# Patient Record
Sex: Male | Born: 1945
Health system: Southern US, Community
[De-identification: ages and names within clinical notes are randomized; demographics above are authoritative.]

## PROBLEM LIST (undated history)

## (undated) DIAGNOSIS — E119 Type 2 diabetes mellitus without complications: Secondary | ICD-10-CM

## (undated) DIAGNOSIS — I639 Cerebral infarction, unspecified: Secondary | ICD-10-CM

## (undated) DIAGNOSIS — T7840XA Allergy, unspecified, initial encounter: Secondary | ICD-10-CM

## (undated) DIAGNOSIS — Q8901 Asplenia (congenital): Secondary | ICD-10-CM

## (undated) DIAGNOSIS — I6529 Occlusion and stenosis of unspecified carotid artery: Secondary | ICD-10-CM

## (undated) DIAGNOSIS — I10 Essential (primary) hypertension: Secondary | ICD-10-CM

## (undated) DIAGNOSIS — C801 Malignant (primary) neoplasm, unspecified: Secondary | ICD-10-CM

## (undated) DIAGNOSIS — E7800 Pure hypercholesterolemia, unspecified: Secondary | ICD-10-CM

## (undated) DIAGNOSIS — H269 Unspecified cataract: Secondary | ICD-10-CM

## (undated) HISTORY — DX: Cerebral infarction, unspecified: I63.9

## (undated) HISTORY — DX: Allergy, unspecified, initial encounter: T78.40XA

## (undated) HISTORY — DX: Malignant (primary) neoplasm, unspecified: C80.1

## (undated) HISTORY — PX: EYE SURGERY: SHX253

## (undated) HISTORY — DX: Unspecified cataract: H26.9

## (undated) HISTORY — PX: PROSTATE SURGERY: SHX751

## (undated) HISTORY — PX: SPLENECTOMY: SUR1306

## (undated) HISTORY — DX: Asplenia (congenital): Q89.01

## (undated) HISTORY — DX: Occlusion and stenosis of unspecified carotid artery: I65.29

---

## 2004-08-16 ENCOUNTER — Ambulatory Visit: Payer: Self-pay | Admitting: Family Medicine

## 2004-09-27 ENCOUNTER — Ambulatory Visit: Payer: Self-pay | Admitting: Family Medicine

## 2005-03-12 ENCOUNTER — Ambulatory Visit: Payer: Self-pay | Admitting: Family Medicine

## 2005-04-11 ENCOUNTER — Ambulatory Visit: Payer: Self-pay | Admitting: Family Medicine

## 2005-12-11 ENCOUNTER — Ambulatory Visit: Payer: Self-pay | Admitting: Family Medicine

## 2015-05-01 DIAGNOSIS — E113213 Type 2 diabetes mellitus with mild nonproliferative diabetic retinopathy with macular edema, bilateral: Secondary | ICD-10-CM | POA: Insufficient documentation

## 2015-05-01 DIAGNOSIS — H47323 Drusen of optic disc, bilateral: Secondary | ICD-10-CM | POA: Insufficient documentation

## 2015-05-01 DIAGNOSIS — H35352 Cystoid macular degeneration, left eye: Secondary | ICD-10-CM | POA: Insufficient documentation

## 2015-05-02 DIAGNOSIS — H35372 Puckering of macula, left eye: Secondary | ICD-10-CM | POA: Insufficient documentation

## 2015-10-13 DIAGNOSIS — H35372 Puckering of macula, left eye: Secondary | ICD-10-CM | POA: Diagnosis not present

## 2015-11-13 DIAGNOSIS — E291 Testicular hypofunction: Secondary | ICD-10-CM | POA: Insufficient documentation

## 2015-11-13 DIAGNOSIS — H538 Other visual disturbances: Secondary | ICD-10-CM | POA: Insufficient documentation

## 2015-11-13 DIAGNOSIS — E876 Hypokalemia: Secondary | ICD-10-CM | POA: Insufficient documentation

## 2015-11-13 DIAGNOSIS — J309 Allergic rhinitis, unspecified: Secondary | ICD-10-CM | POA: Insufficient documentation

## 2015-11-13 DIAGNOSIS — E7849 Other hyperlipidemia: Secondary | ICD-10-CM | POA: Insufficient documentation

## 2015-11-13 DIAGNOSIS — N329 Bladder disorder, unspecified: Secondary | ICD-10-CM | POA: Insufficient documentation

## 2015-12-08 DIAGNOSIS — E113293 Type 2 diabetes mellitus with mild nonproliferative diabetic retinopathy without macular edema, bilateral: Secondary | ICD-10-CM | POA: Diagnosis not present

## 2015-12-08 DIAGNOSIS — H47323 Drusen of optic disc, bilateral: Secondary | ICD-10-CM | POA: Diagnosis not present

## 2016-01-13 DIAGNOSIS — S20212A Contusion of left front wall of thorax, initial encounter: Secondary | ICD-10-CM | POA: Diagnosis not present

## 2016-01-13 DIAGNOSIS — R0781 Pleurodynia: Secondary | ICD-10-CM | POA: Diagnosis not present

## 2016-01-13 DIAGNOSIS — W19XXXA Unspecified fall, initial encounter: Secondary | ICD-10-CM | POA: Diagnosis not present

## 2016-01-29 DIAGNOSIS — Z9081 Acquired absence of spleen: Secondary | ICD-10-CM | POA: Insufficient documentation

## 2016-01-30 DIAGNOSIS — I10 Essential (primary) hypertension: Secondary | ICD-10-CM | POA: Diagnosis not present

## 2016-01-30 DIAGNOSIS — E118 Type 2 diabetes mellitus with unspecified complications: Secondary | ICD-10-CM | POA: Diagnosis not present

## 2016-01-30 DIAGNOSIS — E113213 Type 2 diabetes mellitus with mild nonproliferative diabetic retinopathy with macular edema, bilateral: Secondary | ICD-10-CM | POA: Diagnosis not present

## 2016-01-30 DIAGNOSIS — E784 Other hyperlipidemia: Secondary | ICD-10-CM | POA: Diagnosis not present

## 2016-01-30 DIAGNOSIS — Z794 Long term (current) use of insulin: Secondary | ICD-10-CM | POA: Diagnosis not present

## 2016-02-06 DIAGNOSIS — I639 Cerebral infarction, unspecified: Secondary | ICD-10-CM

## 2016-02-06 HISTORY — DX: Cerebral infarction, unspecified: I63.9

## 2016-02-24 DIAGNOSIS — E119 Type 2 diabetes mellitus without complications: Secondary | ICD-10-CM | POA: Diagnosis not present

## 2016-02-24 DIAGNOSIS — R42 Dizziness and giddiness: Secondary | ICD-10-CM | POA: Diagnosis not present

## 2016-02-24 DIAGNOSIS — R413 Other amnesia: Secondary | ICD-10-CM | POA: Diagnosis not present

## 2016-02-24 DIAGNOSIS — I200 Unstable angina: Secondary | ICD-10-CM | POA: Diagnosis not present

## 2016-02-24 DIAGNOSIS — R531 Weakness: Secondary | ICD-10-CM | POA: Diagnosis not present

## 2016-02-24 DIAGNOSIS — R5383 Other fatigue: Secondary | ICD-10-CM | POA: Diagnosis not present

## 2016-02-24 DIAGNOSIS — I4589 Other specified conduction disorders: Secondary | ICD-10-CM | POA: Diagnosis not present

## 2016-02-24 DIAGNOSIS — D500 Iron deficiency anemia secondary to blood loss (chronic): Secondary | ICD-10-CM | POA: Diagnosis not present

## 2016-02-24 DIAGNOSIS — E1165 Type 2 diabetes mellitus with hyperglycemia: Secondary | ICD-10-CM | POA: Diagnosis not present

## 2016-02-24 DIAGNOSIS — E876 Hypokalemia: Secondary | ICD-10-CM | POA: Diagnosis not present

## 2016-02-26 ENCOUNTER — Emergency Department (HOSPITAL_COMMUNITY): Payer: PPO

## 2016-02-26 ENCOUNTER — Encounter (HOSPITAL_COMMUNITY): Payer: Self-pay

## 2016-02-26 ENCOUNTER — Inpatient Hospital Stay (HOSPITAL_COMMUNITY)
Admission: EM | Admit: 2016-02-26 | Discharge: 2016-02-28 | DRG: 066 | Disposition: A | Discharge status: Home / Self Care | Payer: PPO | Attending: Internal Medicine | Admitting: Internal Medicine

## 2016-02-26 DIAGNOSIS — I6350 Cerebral infarction due to unspecified occlusion or stenosis of unspecified cerebral artery: Secondary | ICD-10-CM | POA: Diagnosis present

## 2016-02-26 DIAGNOSIS — I10 Essential (primary) hypertension: Secondary | ICD-10-CM | POA: Diagnosis present

## 2016-02-26 DIAGNOSIS — E1165 Type 2 diabetes mellitus with hyperglycemia: Secondary | ICD-10-CM | POA: Diagnosis not present

## 2016-02-26 DIAGNOSIS — E0865 Diabetes mellitus due to underlying condition with hyperglycemia: Secondary | ICD-10-CM | POA: Diagnosis not present

## 2016-02-26 DIAGNOSIS — I6529 Occlusion and stenosis of unspecified carotid artery: Secondary | ICD-10-CM | POA: Diagnosis present

## 2016-02-26 DIAGNOSIS — R42 Dizziness and giddiness: Secondary | ICD-10-CM

## 2016-02-26 DIAGNOSIS — I639 Cerebral infarction, unspecified: Secondary | ICD-10-CM | POA: Diagnosis not present

## 2016-02-26 DIAGNOSIS — Z7982 Long term (current) use of aspirin: Secondary | ICD-10-CM | POA: Diagnosis not present

## 2016-02-26 DIAGNOSIS — Z87891 Personal history of nicotine dependence: Secondary | ICD-10-CM | POA: Diagnosis not present

## 2016-02-26 DIAGNOSIS — Z794 Long term (current) use of insulin: Secondary | ICD-10-CM | POA: Diagnosis not present

## 2016-02-26 DIAGNOSIS — R5383 Other fatigue: Secondary | ICD-10-CM | POA: Diagnosis not present

## 2016-02-26 DIAGNOSIS — E785 Hyperlipidemia, unspecified: Secondary | ICD-10-CM | POA: Diagnosis present

## 2016-02-26 DIAGNOSIS — Z9081 Acquired absence of spleen: Secondary | ICD-10-CM | POA: Diagnosis not present

## 2016-02-26 DIAGNOSIS — E7800 Pure hypercholesterolemia, unspecified: Secondary | ICD-10-CM | POA: Diagnosis present

## 2016-02-26 DIAGNOSIS — I6523 Occlusion and stenosis of bilateral carotid arteries: Secondary | ICD-10-CM | POA: Diagnosis not present

## 2016-02-26 HISTORY — DX: Essential (primary) hypertension: I10

## 2016-02-26 HISTORY — DX: Pure hypercholesterolemia, unspecified: E78.00

## 2016-02-26 HISTORY — DX: Type 2 diabetes mellitus without complications: E11.9

## 2016-02-26 LAB — BASIC METABOLIC PANEL
ANION GAP: 7 (ref 5–15)
BUN: 13 mg/dL (ref 6–20)
CHLORIDE: 98 mmol/L — AB (ref 101–111)
CO2: 29 mmol/L (ref 22–32)
Calcium: 8.8 mg/dL — ABNORMAL LOW (ref 8.9–10.3)
Creatinine, Ser: 0.76 mg/dL (ref 0.61–1.24)
GFR calc Af Amer: >60 mL/min (ref >60)
GLUCOSE: 417 mg/dL — AB (ref 65–99)
POTASSIUM: 3.9 mmol/L (ref 3.5–5.1)
Sodium: 134 mmol/L — ABNORMAL LOW (ref 135–145)

## 2016-02-26 LAB — URINALYSIS, ROUTINE W REFLEX MICROSCOPIC
BILIRUBIN URINE: NEGATIVE
Hgb urine dipstick: NEGATIVE
KETONES UR: NEGATIVE mg/dL
LEUKOCYTES UA: NEGATIVE
NITRITE: NEGATIVE
PH: 7 (ref 5.0–8.0)
Protein, ur: 30 mg/dL — AB
SPECIFIC GRAVITY, URINE: 1.038 — AB (ref 1.005–1.030)

## 2016-02-26 LAB — CBG MONITORING, ED
GLUCOSE-CAPILLARY: 192 mg/dL — AB (ref 65–99)
Glucose-Capillary: 415 mg/dL — ABNORMAL HIGH (ref 65–99)

## 2016-02-26 LAB — CBC
HEMATOCRIT: 43.7 % (ref 39.0–52.0)
HEMOGLOBIN: 15.1 g/dL (ref 13.0–17.0)
MCH: 31.1 pg (ref 26.0–34.0)
MCHC: 34.6 g/dL (ref 30.0–36.0)
MCV: 89.9 fL (ref 78.0–100.0)
Platelets: 278 10*3/uL (ref 150–400)
RBC: 4.86 MIL/uL (ref 4.22–5.81)
RDW: 12.8 % (ref 11.5–15.5)
WBC: 7.7 10*3/uL (ref 4.0–10.5)

## 2016-02-26 LAB — URINE MICROSCOPIC-ADD ON
BACTERIA UA: NONE SEEN
RBC / HPF: NONE SEEN RBC/hpf (ref 0–5)
SQUAMOUS EPITHELIAL / LPF: NONE SEEN
WBC UA: NONE SEEN WBC/hpf (ref 0–5)

## 2016-02-26 MED ORDER — ASPIRIN EC 325 MG PO TBEC
325.0000 mg | DELAYED_RELEASE_TABLET | Freq: Once | ORAL | Status: AC
  Administered 2016-02-27: 325 mg via ORAL
  Filled 2016-02-26: qty 1

## 2016-02-26 MED ORDER — INSULIN ASPART 100 UNIT/ML ~~LOC~~ SOLN
10.0000 [IU] | Freq: Once | SUBCUTANEOUS | Status: AC
  Administered 2016-02-26: 10 [IU] via INTRAVENOUS
  Filled 2016-02-26: qty 1

## 2016-02-26 MED ORDER — SODIUM CHLORIDE 0.9 % IV BOLUS (SEPSIS)
1000.0000 mL | Freq: Once | INTRAVENOUS | Status: AC
  Administered 2016-02-26: 1000 mL via INTRAVENOUS

## 2016-02-26 NOTE — ED Provider Notes (Signed)
CSN: [id number]     Arrival date & time 02/26/16  1601 History   First MD Initiated Contact with Patient 02/26/16 1917     Chief Complaint  Patient presents with  . Fatigue  . Hyperglycemia  PT HAS BEEN CONFUSED FOR THE PAST FEW DAYS.  HIS BS HAS REMAINED ELEVATED.  PT IS ALSO ACTING "DRUNK" ALTHOUGH HE HAS NOT BEEN DRINKING ETOH.  PT IS UNCOORDINATED AND HAS MEMORY ISSUES.  THE PT WENT TO [place] 2 DAYS AGO AND HIS BS WAS DECREASED, BUT HE IS NO BETTER.     (Consider location/radiation/quality/duration/timing/severity/associated sxs/prior Treatment) Patient is a 70 y.o. male presenting with hyperglycemia. The history is provided by the patient, the spouse and a relative.  Hyperglycemia Severity:  Moderate Onset quality:  Gradual   Past Medical History  Diagnosis Date  . Hypertension   . Diabetes mellitus without complication ([place])   . Hypercholesteremia    Past Surgical History  Procedure Laterality Date  . Prostate surgery    . Eye surgery    . Splenectomy     History reviewed. No pertinent family history. Social History  Substance Use Topics  . Smoking status: Former Research scientist (life sciences)  . Smokeless tobacco: None  . Alcohol Use: No    Review of Systems    Allergies  Benazepril and Penicillins  Home Medications   Prior to Admission medications   Medication Sig Start Date End Date Taking? Authorizing Provider  Amlodipine-Valsartan-HCTZ 10-160-12.5 MG TABS Take 1 tablet by mouth daily. 02/14/16  Yes Historical Provider, MD  aspirin 81 MG chewable tablet Chew 81 mg by mouth daily.   Yes Historical Provider, MD  atorvastatin (LIPITOR) 40 MG tablet Take 40 mg by mouth daily. 01/18/16  Yes Historical Provider, MD  carvedilol (COREG) 25 MG tablet Take 25 mg by mouth 2 (two) times daily. 02/07/16  Yes Historical Provider, MD  cloNIDine (CATAPRES) 0.1 MG tablet Take 0.5 mg by mouth 2 (two) times daily. 02/07/16  Yes Historical Provider, MD  fluticasone (FLONASE) 50 MCG/ACT nasal  spray Place 2 sprays into both nostrils daily as needed for allergies or rhinitis.   Yes Historical Provider, MD  Insulin Detemir (LEVEMIR FLEXTOUCH) 100 UNIT/ML Pen Inject 24 Units into the skin daily. 01/13/16  Yes Historical Provider, MD  metFORMIN (GLUCOPHAGE) 1000 MG tablet Take 1,000 mg by mouth 2 (two) times daily with a meal. 02/07/16  Yes Historical Provider, MD   BP 174/78 mmHg  Pulse 58  Temp(Src) 98.1 F (36.7 C) (Oral)  Resp 18  SpO2 97% Physical Exam  ED Course  Procedures (including critical care time) Labs Review Labs Reviewed  BASIC METABOLIC PANEL - Abnormal; Notable for the following:    Sodium 134 (*)    Chloride 98 (*)    Glucose, Bld 417 (*)    Calcium 8.8 (*)    All other components within normal limits  URINALYSIS, ROUTINE W REFLEX MICROSCOPIC (NOT AT [facility]) - Abnormal; Notable for the following:    Specific Gravity, Urine 1.038 (*)    Glucose, UA >1000 (*)    Protein, ur 30 (*)    All other components within normal limits  CBG MONITORING, ED - Abnormal; Notable for the following:    Glucose-Capillary 415 (*)    All other components within normal limits  CBG MONITORING, ED - Abnormal; Notable for the following:    Glucose-Capillary 192 (*)    All other components within normal limits  CBC  URINE MICROSCOPIC-ADD ON  CBG  MONITORING, ED    Imaging Review Dg Chest 2 View  02/26/2016  CLINICAL DATA:  Lethargy, dizziness, diabetes. EXAM: CHEST  2 VIEW COMPARISON:  Chest x-ray dated 02/24/2016 and chest x-ray dated 01/13/2016. FINDINGS: Heart size is normal. Overall cardiomediastinal silhouette is stable in size and configuration. Lungs are clear. No evidence of pneumonia. No pleural effusion or pneumothorax seen. Osseous structures about the chest are unremarkable. IMPRESSION: Lungs are clear and there is no evidence of acute cardiopulmonary abnormality. Electronically Signed   By: Franki Cabot M.D.   On: 02/26/2016 21:16   Mr Brain Wo Contrast  02/26/2016   CLINICAL DATA:  Initial evaluation for acute dizziness. EXAM: MRI HEAD WITHOUT CONTRAST TECHNIQUE: Multiplanar, multiecho pulse sequences of the brain and surrounding structures were obtained without intravenous contrast. COMPARISON:  None. FINDINGS: Study mildly degraded by motion artifact. Cerebral volume within normal limits for patient age. Mild scattered T2/FLAIR hyperintensity within the periventricular and deep white matter both cerebral hemispheres most consistent with chronic small vessel ischemic disease. There is an acute/early subacute ischemic infarct involving the right thalamus that measures 13 x 23 x 24 mm (series 3, image 27). No significant mass effect. No associated hemorrhage. No other acute infarct. Gray-white matter differentiation otherwise maintained. Major intracranial vascular flow voids are preserved. No mass lesion, midline shift, or mass effect. No hydrocephalus. No extra-axial fluid collection. No acute or chronic intracranial hemorrhage. Major dural sinuses are grossly patent. Craniocervical junction normal. Visualized upper cervical spine unremarkable. Pituitary gland within normal limits. No acute abnormality about the orbits. Sequela prior bilateral lens extraction noted. Mild scattered mucosal thickening within the ethmoidal air cells. Paranasal sinuses are otherwise clear. No mastoid effusion. Inner ear structures grossly normal. Bone marrow signal intensity within normal limits. No scalp soft tissue abnormality. IMPRESSION: 1. 13 x 23 x 24 mm acute/early subacute ischemic nonhemorrhagic right thalamic infarct. No associated mass effect. 2. Mild chronic small vessel ischemic disease. Electronically Signed   By: Jeannine Boga M.D.   On: 02/26/2016 22:32   I have personally reviewed and evaluated these images and lab results as part of my medical decision-making.   EKG Interpretation   Date/Time:  Sunday Feb 26 2016 16:39:15 EDT Ventricular Rate:  66 PR Interval:   190 QRS Duration: 113 QT Interval:  434 QTC Calculation: 455 R Axis:   78 Text Interpretation:  Sinus rhythm Anteroseptal infarct, age indeterminate  Baseline wander in lead(s) V3 V4 V6 Confirmed by Michella Detjen MD, Shemar Plemmons  ([id number]) on 02/26/2016 10:02:29 PM      MDM  PT D/W DR. Nicole Kindred (NEURO) WHO THINKS PT NEEDS TO BE ADMITTED.  HE REQUESTS A TRANSFER TO [facility] FOR FURTHER CVA WORK UP.  PT WAS D/W DR. Olevia Bowens WHO WILL ADMIT PT AND ARRANGE FOR TRANSFER. Final diagnoses:  Cerebral infarction due to cerebral artery occlusion ([place])  Poorly controlled diabetes mellitus ([facility])        Isla Pence, MD 02/27/16 0002

## 2016-02-26 NOTE — ED Notes (Signed)
Pt transported to MRI 

## 2016-02-26 NOTE — H&P (Signed)
History and Physical    Mark Stewart [id number] DOB: Jun 20, 1946 DOA: 02/26/2016  PCP: No primary care provider on file.   Patient coming from: Home.  Chief Complaint: Fatigue and elevated blood sugar.  HPI: Mark Stewart is a 70 y.o. male with medical history significant of hypertension, type 2 diabetes, hyperlipidemia, seasonal allergies comes to the emergency department.  Per patient and his family, for the last 2 weeks, he has been feeling increasingly more fatigued. He has noticed that his blood glucose and blood pressure and location have been elevated. His wife also states that he has been more forgetful in the past few months as well. She recently had surgeries and he has been taking care of her. On Thursday, patient was working outside the house during the morning, until around noontime when he felt more fatigue, lightheaded and was "acting drunk without drinking alcohol".   He went to his doctor who performed some lab work and asked him to go to the emergency department if symptoms persisted. He subsequently went to [facility] in [place] where he lives. Workup there was normal, and he was asked to increase his long-acting insulin from 20 units to 24 units SQ daily.  ED Course: Workup shows a normal CBC, hyperglycemia of 417 mg/dL, glucosuria and mild proteinuria on UA. Imaging of the brain with MRI shows a13 x 23 x 24 mm acute/early subacute ischemic nonhemorrhagic right thalamic infarct. No associated mass effect. Transferred to [facility] for further evaluation and treatment.  Review of Systems: As per HPI otherwise 10 point review of systems negative.    Past Medical History  Diagnosis Date  . Hypertension   . Diabetes mellitus without complication ([place])   . Hypercholesteremia     Past Surgical History  Procedure Laterality Date  . Prostate surgery    . Eye surgery    . Splenectomy       reports that he has quit smoking. He does not have any  smokeless tobacco history on file. He reports that he does not drink alcohol or use illicit drugs.  Allergies  Allergen Reactions  . Benazepril Cough  . Penicillins Rash    Has patient had a PCN reaction causing immediate rash, facial/tongue/throat swelling, SOB or lightheadedness with hypotension: yes Has patient had a PCN reaction causing severe rash involving mucus membranes or skin necrosis: no Has patient had a PCN reaction that required hospitalization: no Has patient had a PCN reaction occurring within the last 10 years: no If all of the above answers are "NO", then may proceed with Cephalosporin use.     Family History  Problem Relation Age of Onset  . Breast cancer Mother   . Diabetes Mellitus II Father   . Breast cancer Sister      Prior to Admission medications   Medication Sig Start Date End Date Taking? Authorizing Provider  Amlodipine-Valsartan-HCTZ 10-160-12.5 MG TABS Take 1 tablet by mouth daily. 02/14/16  Yes Historical Provider, MD  aspirin 81 MG chewable tablet Chew 81 mg by mouth daily.   Yes Historical Provider, MD  atorvastatin (LIPITOR) 40 MG tablet Take 40 mg by mouth daily. 01/18/16  Yes Historical Provider, MD  carvedilol (COREG) 25 MG tablet Take 25 mg by mouth 2 (two) times daily. 02/07/16  Yes Historical Provider, MD  cloNIDine (CATAPRES) 0.1 MG tablet Take 0.5 mg by mouth 2 (two) times daily. 02/07/16  Yes Historical Provider, MD  fluticasone (FLONASE) 50 MCG/ACT nasal spray Place 2  sprays into both nostrils daily as needed for allergies or rhinitis.   Yes Historical Provider, MD  Insulin Detemir (LEVEMIR FLEXTOUCH) 100 UNIT/ML Pen Inject 24 Units into the skin daily. 01/13/16  Yes Historical Provider, MD  metFORMIN (GLUCOPHAGE) 1000 MG tablet Take 1,000 mg by mouth 2 (two) times daily with a meal. 02/07/16  Yes Historical Provider, MD    Physical Exam: Filed Vitals:   02/26/16 1852 02/26/16 1950 02/26/16 2100 02/26/16 2304  BP: 171/95 187/82 193/90 174/78    Pulse: 56 58 72 58  Temp:      TempSrc:      Resp: 18 16  18   SpO2: 95% 97% 100% 97%      Constitutional: NAD, calm, comfortable Filed Vitals:   02/26/16 1852 02/26/16 1950 02/26/16 2100 02/26/16 2304  BP: 171/95 187/82 193/90 174/78  Pulse: 56 58 72 58  Temp:      TempSrc:      Resp: 18 16  18   SpO2: 95% 97% 100% 97%   Eyes: PERRL, lids and conjunctivae normal ENMT: Mucous membranes are moist. Posterior pharynx clear of any exudate or lesions.Normal dentition.  Neck: normal, supple, no masses, no thyromegaly Respiratory: clear to auscultation bilaterally, no wheezing, no crackles. Normal respiratory effort. No accessory muscle use.  Cardiovascular: Regular rate and rhythm, no murmurs / rubs / gallops. No extremity edema. 2+ pedal pulses. No carotid bruits.  Abdomen: no tenderness, no masses palpated. No hepatosplenomegaly. Bowel sounds positive.  Musculoskeletal: no clubbing / cyanosis. No joint deformity upper and lower extremities. Good ROM, no contractures. Normal muscle tone.  Skin: no rashes, lesions, ulcers. No induration Neurologic: Mild left facial droop (at baseline per patient and family members), otherwise CN 2-12 grossly intact. Sensation intact, patellar DTR normal. Strength 5/5 in all 4 extremities. Normal coordination on nose to finger test. No pronator drift, gait was unsteady. Psychiatric: Normal judgment and insight. Alert and oriented x 4. Normal mood.    Labs on Admission: I have personally reviewed following labs and imaging studies  CBC:  Recent Labs Lab 02/26/16 1647  WBC 7.7  HGB 15.1  HCT 43.7  MCV 89.9  PLT 0000000   Basic Metabolic Panel:  Recent Labs Lab 02/26/16 1647  NA 134*  K 3.9  CL 98*  CO2 29  GLUCOSE 417*  BUN 13  CREATININE 0.76  CALCIUM 8.8*   GFR: CrCl cannot be calculated (Unknown ideal weight.).  CBG:  Recent Labs Lab 02/26/16 1635 02/26/16 2254 02/27/16 0021 02/27/16 0122  GLUCAP 415* 192* 204* 259*    Urine analysis:    Component Value Date/Time   COLORURINE YELLOW 02/26/2016 1856   APPEARANCEUR CLEAR 02/26/2016 1856   LABSPEC 1.038* 02/26/2016 1856   PHURINE 7.0 02/26/2016 1856   GLUCOSEU >1000* 02/26/2016 1856   HGBUR NEGATIVE 02/26/2016 1856   BILIRUBINUR NEGATIVE 02/26/2016 1856   KETONESUR NEGATIVE 02/26/2016 1856   PROTEINUR 30* 02/26/2016 1856   NITRITE NEGATIVE 02/26/2016 1856   LEUKOCYTESUR NEGATIVE 02/26/2016 1856   Radiological Exams on Admission: Dg Chest 2 View  02/26/2016  CLINICAL DATA:  Lethargy, dizziness, diabetes. EXAM: CHEST  2 VIEW COMPARISON:  Chest x-ray dated 02/24/2016 and chest x-ray dated 01/13/2016. FINDINGS: Heart size is normal. Overall cardiomediastinal silhouette is stable in size and configuration. Lungs are clear. No evidence of pneumonia. No pleural effusion or pneumothorax seen. Osseous structures about the chest are unremarkable. IMPRESSION: Lungs are clear and there is no evidence of acute cardiopulmonary abnormality. Electronically Signed  By: Franki Cabot M.D.   On: 02/26/2016 21:16   Mr Brain Wo Contrast  02/26/2016  CLINICAL DATA:  Initial evaluation for acute dizziness. EXAM: MRI HEAD WITHOUT CONTRAST TECHNIQUE: Multiplanar, multiecho pulse sequences of the brain and surrounding structures were obtained without intravenous contrast. COMPARISON:  None. FINDINGS: Study mildly degraded by motion artifact. Cerebral volume within normal limits for patient age. Mild scattered T2/FLAIR hyperintensity within the periventricular and deep white matter both cerebral hemispheres most consistent with chronic small vessel ischemic disease. There is an acute/early subacute ischemic infarct involving the right thalamus that measures 13 x 23 x 24 mm (series 3, image 27). No significant mass effect. No associated hemorrhage. No other acute infarct. Gray-white matter differentiation otherwise maintained. Major intracranial vascular flow voids are preserved. No  mass lesion, midline shift, or mass effect. No hydrocephalus. No extra-axial fluid collection. No acute or chronic intracranial hemorrhage. Major dural sinuses are grossly patent. Craniocervical junction normal. Visualized upper cervical spine unremarkable. Pituitary gland within normal limits. No acute abnormality about the orbits. Sequela prior bilateral lens extraction noted. Mild scattered mucosal thickening within the ethmoidal air cells. Paranasal sinuses are otherwise clear. No mastoid effusion. Inner ear structures grossly normal. Bone marrow signal intensity within normal limits. No scalp soft tissue abnormality. IMPRESSION: 1. 13 x 23 x 24 mm acute/early subacute ischemic nonhemorrhagic right thalamic infarct. No associated mass effect. 2. Mild chronic small vessel ischemic disease. Electronically Signed   By: Jeannine Boga M.D.   On: 02/26/2016 22:32    EKG: Independently reviewed.  Vent. rate 66 BPM PR interval 190 ms QRS duration 113 ms QT/QTc 434/455 ms P-R-T axes 81 78 84 Sinus rhythm Anteroseptal infarct, age indeterminate Baseline wander in lead(s) V3 V4 V6 No previous tracing to compare to.  Assessment/Plan Principal Problem:   Acute CVA (cerebrovascular accident) ([place]) Admit to telemetry/inpatient. Frequent neuro checks. Speech and swallow evaluation.  PT evaluation. Neurology consult. Check carotid Doppler. Check echocardiogram. Check MRA to brain.  Active Problems:   Hyperlipidemia Continue atorvastatin 40 mg by mouth daily. Check LFTs periodically.    Hypertension  Hold antihypertensives for now. Monitor blood pressure closely.    Type 2 diabetes mellitus ([place]) Nothing by mouth for now before swallow test. Carbohydrate modified diet.  Resume metformin once swallow test is passed. Continue Levemir 24 units daily. CBG monitoring and regular insulin sliding scale.  DVT prophylaxis: Lovenox. Code Status: Full Family Communication: His wife and son  were present in the room. Disposition Plan: Admit to the neurology floor at [facility]                                for further evaluation and treatment. Consults called: Neurology (Dr. Nicole Kindred.)  Admission status: Telemetry/inpatient.   Reubin Milan MD [facility] Pager [phone].  If 7PM-7AM, please contact night-coverage [email or web address] Password [email or web address]  02/27/2016, 1:55 AM

## 2016-02-26 NOTE — ED Notes (Signed)
Pt has been more fatigued in last 2 weeks.  Pt went to [facility] on Friday morning.  Multiple labs drawn but no results.  Told to go to ER if continued.  Pt went to [facility].  All test, ekg, chest xray normal.  However, hyperglycemia.  Pt had HTN and elevated glucose in 500's.  Pt given orders to increase insulin from 20units to 24 units.  Pt remains symptomatic and wants second opinion.  No neuro deficit noted.  Pt has natural droop to left face.  All grips, strength normal.

## 2016-02-27 ENCOUNTER — Encounter (HOSPITAL_COMMUNITY): Payer: Self-pay | Admitting: Internal Medicine

## 2016-02-27 ENCOUNTER — Inpatient Hospital Stay (HOSPITAL_COMMUNITY): Payer: PPO

## 2016-02-27 DIAGNOSIS — Z9081 Acquired absence of spleen: Secondary | ICD-10-CM | POA: Diagnosis not present

## 2016-02-27 DIAGNOSIS — E7800 Pure hypercholesterolemia, unspecified: Secondary | ICD-10-CM | POA: Diagnosis present

## 2016-02-27 DIAGNOSIS — I6529 Occlusion and stenosis of unspecified carotid artery: Secondary | ICD-10-CM | POA: Diagnosis not present

## 2016-02-27 DIAGNOSIS — R5383 Other fatigue: Secondary | ICD-10-CM | POA: Diagnosis not present

## 2016-02-27 DIAGNOSIS — E1165 Type 2 diabetes mellitus with hyperglycemia: Secondary | ICD-10-CM | POA: Diagnosis not present

## 2016-02-27 DIAGNOSIS — I6523 Occlusion and stenosis of bilateral carotid arteries: Secondary | ICD-10-CM | POA: Diagnosis not present

## 2016-02-27 DIAGNOSIS — I639 Cerebral infarction, unspecified: Principal | ICD-10-CM

## 2016-02-27 DIAGNOSIS — Z794 Long term (current) use of insulin: Secondary | ICD-10-CM | POA: Diagnosis not present

## 2016-02-27 DIAGNOSIS — Z7982 Long term (current) use of aspirin: Secondary | ICD-10-CM | POA: Diagnosis not present

## 2016-02-27 DIAGNOSIS — Z87891 Personal history of nicotine dependence: Secondary | ICD-10-CM | POA: Diagnosis not present

## 2016-02-27 DIAGNOSIS — I6350 Cerebral infarction due to unspecified occlusion or stenosis of unspecified cerebral artery: Secondary | ICD-10-CM

## 2016-02-27 DIAGNOSIS — E0865 Diabetes mellitus due to underlying condition with hyperglycemia: Secondary | ICD-10-CM

## 2016-02-27 DIAGNOSIS — I10 Essential (primary) hypertension: Secondary | ICD-10-CM

## 2016-02-27 DIAGNOSIS — R42 Dizziness and giddiness: Secondary | ICD-10-CM | POA: Diagnosis not present

## 2016-02-27 DIAGNOSIS — E785 Hyperlipidemia, unspecified: Secondary | ICD-10-CM | POA: Diagnosis not present

## 2016-02-27 LAB — CBC
HCT: 42.9 % (ref 39.0–52.0)
HEMOGLOBIN: 14.2 g/dL (ref 13.0–17.0)
MCH: 30.4 pg (ref 26.0–34.0)
MCHC: 33.1 g/dL (ref 30.0–36.0)
MCV: 91.9 fL (ref 78.0–100.0)
Platelets: 248 10*3/uL (ref 150–400)
RBC: 4.67 MIL/uL (ref 4.22–5.81)
RDW: 13.1 % (ref 11.5–15.5)
WBC: 6.6 10*3/uL (ref 4.0–10.5)

## 2016-02-27 LAB — GLUCOSE, CAPILLARY
GLUCOSE-CAPILLARY: 237 mg/dL — AB (ref 65–99)
GLUCOSE-CAPILLARY: 317 mg/dL — AB (ref 65–99)
GLUCOSE-CAPILLARY: 266 mg/dL — AB (ref 65–99)
GLUCOSE-CAPILLARY: 268 mg/dL — AB (ref 65–99)

## 2016-02-27 LAB — CBG MONITORING, ED
GLUCOSE-CAPILLARY: 204 mg/dL — AB (ref 65–99)
GLUCOSE-CAPILLARY: 259 mg/dL — AB (ref 65–99)
Glucose-Capillary: 246 mg/dL — ABNORMAL HIGH (ref 65–99)

## 2016-02-27 LAB — COMPREHENSIVE METABOLIC PANEL
ALK PHOS: 72 U/L (ref 38–126)
ALT: 16 U/L — ABNORMAL LOW (ref 17–63)
ANION GAP: 6 (ref 5–15)
AST: 14 U/L — ABNORMAL LOW (ref 15–41)
Albumin: 3.1 g/dL — ABNORMAL LOW (ref 3.5–5.0)
BUN: 8 mg/dL (ref 6–20)
CALCIUM: 8.5 mg/dL — AB (ref 8.9–10.3)
CO2: 28 mmol/L (ref 22–32)
CREATININE: 0.71 mg/dL (ref 0.61–1.24)
Chloride: 103 mmol/L (ref 101–111)
Glucose, Bld: 251 mg/dL — ABNORMAL HIGH (ref 65–99)
Potassium: 3.7 mmol/L (ref 3.5–5.1)
SODIUM: 137 mmol/L (ref 135–145)
TOTAL PROTEIN: 6.4 g/dL — AB (ref 6.5–8.1)
Total Bilirubin: 0.8 mg/dL (ref 0.3–1.2)

## 2016-02-27 LAB — LIPID PANEL
CHOL/HDL RATIO: 3.6 ratio
CHOLESTEROL: 121 mg/dL (ref 0–200)
HDL: 34 mg/dL — AB (ref >40)
LDL Cholesterol: 71 mg/dL (ref 0–99)
Triglycerides: 82 mg/dL (ref <150)
VLDL: 16 mg/dL (ref 0–40)

## 2016-02-27 LAB — ECHOCARDIOGRAM COMPLETE
HEIGHTINCHES: 72 in
WEIGHTICAEL: 2880 [oz_av]

## 2016-02-27 MED ORDER — INSULIN ASPART 100 UNIT/ML ~~LOC~~ SOLN
0.0000 [IU] | Freq: Every day | SUBCUTANEOUS | Status: DC
  Administered 2016-02-27: 3 [IU] via SUBCUTANEOUS

## 2016-02-27 MED ORDER — SENNOSIDES-DOCUSATE SODIUM 8.6-50 MG PO TABS: 1.0000 | ORAL_TABLET | Freq: Every evening | ORAL | Status: DC | PRN

## 2016-02-27 MED ORDER — STROKE: EARLY STAGES OF RECOVERY BOOK
Freq: Once | Status: AC
  Administered 2016-02-27: 06:00:00
  Filled 2016-02-27: qty 1

## 2016-02-27 MED ORDER — INSULIN DETEMIR 100 UNIT/ML ~~LOC~~ SOLN
25.0000 [IU] | Freq: Every day | SUBCUTANEOUS | Status: DC
  Administered 2016-02-27: 25 [IU] via SUBCUTANEOUS
  Filled 2016-02-27: qty 0.25

## 2016-02-27 MED ORDER — ATORVASTATIN CALCIUM 20 MG PO TABS: 20.0000 mg | ORAL_TABLET | Freq: Every day | ORAL | Status: DC

## 2016-02-27 MED ORDER — CARVEDILOL 25 MG PO TABS
25.0000 mg | ORAL_TABLET | Freq: Two times a day (BID) | ORAL | Status: DC
  Administered 2016-02-27 – 2016-02-28 (×2): 25 mg via ORAL
  Filled 2016-02-27 (×2): qty 1

## 2016-02-27 MED ORDER — SODIUM CHLORIDE 0.9% FLUSH
3.0000 mL | Freq: Two times a day (BID) | INTRAVENOUS | Status: DC
  Administered 2016-02-27 – 2016-02-28 (×3): 3 mL via INTRAVENOUS

## 2016-02-27 MED ORDER — IOPAMIDOL (ISOVUE-370) INJECTION 76%
INTRAVENOUS | Status: AC
  Administered 2016-02-27: 50 mL
  Filled 2016-02-27: qty 50

## 2016-02-27 MED ORDER — ATORVASTATIN CALCIUM 40 MG PO TABS: 40.0000 mg | ORAL_TABLET | Freq: Every day | ORAL | Status: DC

## 2016-02-27 MED ORDER — HYDRALAZINE HCL 20 MG/ML IJ SOLN
10.0000 mg | Freq: Four times a day (QID) | INTRAMUSCULAR | Status: DC | PRN
  Administered 2016-02-27 (×2): 10 mg via INTRAVENOUS
  Filled 2016-02-27 (×2): qty 1

## 2016-02-27 MED ORDER — INSULIN ASPART 100 UNIT/ML ~~LOC~~ SOLN
4.0000 [IU] | Freq: Three times a day (TID) | SUBCUTANEOUS | Status: DC
  Administered 2016-02-27: 4 [IU] via SUBCUTANEOUS

## 2016-02-27 MED ORDER — ENOXAPARIN SODIUM 40 MG/0.4ML ~~LOC~~ SOLN
40.0000 mg | SUBCUTANEOUS | Status: DC
  Administered 2016-02-27 – 2016-02-28 (×2): 40 mg via SUBCUTANEOUS
  Filled 2016-02-27 (×3): qty 0.4

## 2016-02-27 MED ORDER — INSULIN ASPART 100 UNIT/ML ~~LOC~~ SOLN
0.0000 [IU] | Freq: Three times a day (TID) | SUBCUTANEOUS | Status: DC
  Administered 2016-02-27: 8 [IU] via SUBCUTANEOUS
  Administered 2016-02-27: 11 [IU] via SUBCUTANEOUS
  Administered 2016-02-28 (×2): 2 [IU] via SUBCUTANEOUS

## 2016-02-27 NOTE — ED Notes (Addendum)
Disregard BP at 0100. Pt was moving. Mistakenly validated.

## 2016-02-27 NOTE — Progress Notes (Signed)
Inpatient Diabetes Program Recommendations  AACE/ADA: New Consensus Statement on Inpatient Glycemic Control (2015)  Target Ranges:  Prepandial:   less than 140 mg/dL      Peak postprandial:   less than 180 mg/dL (1-2 hours)      Critically ill patients:  140 - 180 mg/dL   Results for LAO, VARCOE (MRN [id number]) as of 02/27/2016 12:17  Ref. Range 02/26/2016 22:54 02/27/2016 00:21 02/27/2016 01:22 02/27/2016 03:01 02/27/2016 06:13  Glucose-Capillary Latest Ref Range: 65-99 mg/dL 192 (H) 204 (H) 259 (H) 246 (H) 237 (H)   Review of Glycemic Control  Diabetes history: DM 2 Outpatient Diabetes medications: Levemir 24 units Daily, Metformin 1,000 mg BID Current orders for Inpatient glycemic control: Novolog Moderate + HS scale  Inpatient Diabetes Program Recommendations: Insulin - Basal: Glucose consistently elevated >200 mg/dl. Patient takes Levemir at home. Please consider starting part of patient's home dose of Levemir, 15 units Q24hrs.  Thanks,  Tama Headings RN, MSN, [facility] Inpatient Diabetes Coordinator Team Pager [phone] (8a-5p)

## 2016-02-27 NOTE — H&P (Signed)
PROGRESS NOTE                                                                                                                                                                                                             Patient Demographics:    Mark Stewart, is a 70 y.o. male, DOB - April 25, 1946, [id number]  Admit date - 02/26/2016   Admitting Physician Reubin Milan, MD  Outpatient Primary MD for the patient is No primary care provider on file.  LOS - 1  Outpatient Specialists: None  Chief Complaint  Patient presents with  . Fatigue  . Hyperglycemia       Brief Narrative  70 year old male with uncontrolled diabetes mellitus (reports A1c of 11 2 months ago), uncontrolled hypertension and hyperlipidemia presented with dizziness and unsteady gait for 4 days. Patient seen at [facility] [place] and transferred to [facility] for further management. MRI brain showing acute right ischemic thalamic infarct. No TPA given as patient was given therapeutic window for TPA.   Subjective:   Patient denies any dizziness or weakness.   Assessment  & Plan :    Principal Problem:   Acute CVA (cerebrovascular accident) ([place]) Risk factors include uncontrolled hypertension and diabetes. Lipid panel within normal limits.  2-D echo shows normal EF of 50 to have an 60% with grade 1 diastolic dysfunction. No cardiac source of ischemia or emboli. CT Angiogram of the neck shows nonsignificant carotid stenosis Allow permissive blood pressure.. Full dose aspirin. Stroke team following. PT recommends outpatient therapy.   Active Problems:   Hyperlipidemia LDL at goal.    Hypertension Allow permissive blood pressure. Needs aggressive blood pressure control as outpatient.    Type 2 diabetes mellitus ([place]), uncontrolled Check A1c. Resume Levemir (increased dose to 25 units) with pre-meal coverage for tighter blood glucose  control.       Code Status : Full code  Family Communication  : None at bedside  Disposition Plan  : Home possibly tomorrow with outpatient PT  Barriers For Discharge :   Consults  : neurology  Procedures  : CT angiogram head and neck MRI brain 2-D echo  DVT Prophylaxis  :  Lovenox -   Lab Results  Component Value Date   PLT 248 02/27/2016    Antibiotics  :  None  Anti-infectives  None        Objective:   Filed Vitals:   02/27/16 0927 02/27/16 1100 02/27/16 1112 02/27/16 1310  BP: 176/87  158/69 190/83  Pulse: 63  67 76  Temp:   98.6 F (37 C)   TempSrc:   Oral   Resp:   17   Height:  6' (1.829 m)    Weight:  81.647 kg (180 lb)    SpO2: 98%  97% 98%    Wt Readings from Last 3 Encounters:  02/27/16 81.647 kg (180 lb)    No intake or output data in the 24 hours ending 02/27/16 1604   Physical Exam  Gen: not in distress HEENT: no pallor, moist mucosa, supple neck Chest: clear b/l, no added sounds CVS: N S1&S2, no murmurs, rubs or gallop GI: soft, NT, ND, BS+ Musculoskeletal: warm, no edema CNS: AAOX3, non focal    Data Review:    CBC  Recent Labs Lab 02/26/16 1647 02/27/16 0536  WBC 7.7 6.6  HGB 15.1 14.2  HCT 43.7 42.9  PLT 278 248  MCV 89.9 91.9  MCH 31.1 30.4  MCHC 34.6 33.1  RDW 12.8 13.1    Chemistries   Recent Labs Lab 02/26/16 1647 02/27/16 0536  NA 134* 137  K 3.9 3.7  CL 98* 103  CO2 29 28  GLUCOSE 417* 251*  BUN 13 8  CREATININE 0.76 0.71  CALCIUM 8.8* 8.5*  AST  --  14*  ALT  --  16*  ALKPHOS  --  72  BILITOT  --  0.8   ------------------------------------------------------------------------------------------------------------------  Recent Labs  02/27/16 0536  CHOL 121  HDL 34*  LDLCALC 71  TRIG 82  CHOLHDL 3.6    No results found for: HGBA1C ------------------------------------------------------------------------------------------------------------------ No results for input(s): TSH,  T4TOTAL, T3FREE, THYROIDAB in the last 72 hours.  Invalid input(s): FREET3 ------------------------------------------------------------------------------------------------------------------ No results for input(s): VITAMINB12, FOLATE, FERRITIN, TIBC, IRON, RETICCTPCT in the last 72 hours.  Coagulation profile No results for input(s): INR, PROTIME in the last 168 hours.  No results for input(s): DDIMER in the last 72 hours.  Cardiac Enzymes No results for input(s): CKMB, TROPONINI, MYOGLOBIN in the last 168 hours.  Invalid input(s): CK ------------------------------------------------------------------------------------------------------------------ No results found for: BNP  Inpatient Medications  Scheduled Meds: . enoxaparin (LOVENOX) injection  40 mg Subcutaneous Q24H  . insulin aspart  0-15 Units Subcutaneous TID WC  . insulin aspart  0-5 Units Subcutaneous QHS  . sodium chloride flush  3 mL Intravenous Q12H   Continuous Infusions:  PRN Meds:.hydrALAZINE, senna-docusate  Micro Results No results found for this or any previous visit (from the past 240 hour(s)).  Radiology Reports Ct Angio Head W/cm &/or Wo Cm  02/27/2016  CLINICAL DATA:  Dizziness for 2 weeks. EXAM: CT ANGIOGRAPHY HEAD AND NECK TECHNIQUE: Multidetector CT imaging of the head and neck was performed using the standard protocol during bolus administration of intravenous contrast. Multiplanar CT image reconstructions and MIPs were obtained to evaluate the vascular anatomy. Carotid stenosis measurements (when applicable) are obtained utilizing NASCET criteria, using the distal internal carotid diameter as the denominator. CONTRAST:  50 cc Isovue 370 intravenous COMPARISON:  None. FINDINGS: CT HEAD Brain: Known perforator infarct affecting the right thalamus. No superimposed hemorrhage. No notable ischemic changes elsewhere. No hydrocephalus. Calvarium and skull base: Negative Paranasal sinuses: Clear Orbits: Bilateral  optic drusen.  Bilateral cataract resection. CTA NECK Aortic arch: No aneurysm or dissection.  Three vessel branching Right carotid system: Predominately calcified  plaque at the bifurcation without stenosis greater than 50%. No dissection or plaque ulceration noted. Left carotid system: Predominant noncalcified plaque along the mid common carotid and bifurcation without flow limiting stenosis or dissection. No plaque ulceration. Vertebral arteries:Left dominant. Moderate atheromatous narrowing at the left ostia (503: 145). There is prominent plaque on the proximal left subclavian artery without stenosis greater than 50%. Positive remodeling at the subclavian atherosclerosis. Smooth and patent right vertebral artery. Skeleton: No contributory finding. Other neck: No incidental mass in the neck. Symmetric prominence of lingual tonsils. CTA HEAD Anterior circulation: Intact circle-of-Willis with large communicating arteries. No major branch occlusion or flow limiting stenosis. Negative for aneurysm. Posterior circulation: Left dominant. Fetal type PCA bilaterally. Right P4 branch stenosis noted 503: 127 and 506:13. Otherwise widely patent. No regularity along the right posterior communicating artery or P1 segment to correlate with acute infarct. Venous sinuses: Patent Anatomic variants: Intact circle-of-Willis.  Fetal type PCA. Delayed phase: No parenchymal enhancement or mass. IMPRESSION: 1. No acute arterial finding. 2. Cervical carotid atherosclerosis without flow limiting stenosis. 3. Moderate atheromatous narrowing of the dominant left vertebral artery ostium. 4. Focal high-grade right P4 branch stenosis. Electronically Signed   By: Monte Fantasia M.D.   On: 02/27/2016 10:57   Dg Chest 2 View  02/26/2016  CLINICAL DATA:  Lethargy, dizziness, diabetes. EXAM: CHEST  2 VIEW COMPARISON:  Chest x-ray dated 02/24/2016 and chest x-ray dated 01/13/2016. FINDINGS: Heart size is normal. Overall cardiomediastinal  silhouette is stable in size and configuration. Lungs are clear. No evidence of pneumonia. No pleural effusion or pneumothorax seen. Osseous structures about the chest are unremarkable. IMPRESSION: Lungs are clear and there is no evidence of acute cardiopulmonary abnormality. Electronically Signed   By: Franki Cabot M.D.   On: 02/26/2016 21:16   Ct Angio Neck W/cm &/or Wo/cm  02/27/2016  CLINICAL DATA:  Dizziness for 2 weeks. EXAM: CT ANGIOGRAPHY HEAD AND NECK TECHNIQUE: Multidetector CT imaging of the head and neck was performed using the standard protocol during bolus administration of intravenous contrast. Multiplanar CT image reconstructions and MIPs were obtained to evaluate the vascular anatomy. Carotid stenosis measurements (when applicable) are obtained utilizing NASCET criteria, using the distal internal carotid diameter as the denominator. CONTRAST:  50 cc Isovue 370 intravenous COMPARISON:  None. FINDINGS: CT HEAD Brain: Known perforator infarct affecting the right thalamus. No superimposed hemorrhage. No notable ischemic changes elsewhere. No hydrocephalus. Calvarium and skull base: Negative Paranasal sinuses: Clear Orbits: Bilateral optic drusen.  Bilateral cataract resection. CTA NECK Aortic arch: No aneurysm or dissection.  Three vessel branching Right carotid system: Predominately calcified plaque at the bifurcation without stenosis greater than 50%. No dissection or plaque ulceration noted. Left carotid system: Predominant noncalcified plaque along the mid common carotid and bifurcation without flow limiting stenosis or dissection. No plaque ulceration. Vertebral arteries:Left dominant. Moderate atheromatous narrowing at the left ostia (503: 145). There is prominent plaque on the proximal left subclavian artery without stenosis greater than 50%. Positive remodeling at the subclavian atherosclerosis. Smooth and patent right vertebral artery. Skeleton: No contributory finding. Other neck: No  incidental mass in the neck. Symmetric prominence of lingual tonsils. CTA HEAD Anterior circulation: Intact circle-of-Willis with large communicating arteries. No major branch occlusion or flow limiting stenosis. Negative for aneurysm. Posterior circulation: Left dominant. Fetal type PCA bilaterally. Right P4 branch stenosis noted 503: 127 and 506:13. Otherwise widely patent. No regularity along the right posterior communicating artery or P1 segment to correlate with acute infarct. Venous  sinuses: Patent Anatomic variants: Intact circle-of-Willis.  Fetal type PCA. Delayed phase: No parenchymal enhancement or mass. IMPRESSION: 1. No acute arterial finding. 2. Cervical carotid atherosclerosis without flow limiting stenosis. 3. Moderate atheromatous narrowing of the dominant left vertebral artery ostium. 4. Focal high-grade right P4 branch stenosis. Electronically Signed   By: Monte Fantasia M.D.   On: 02/27/2016 10:57   Mr Brain Wo Contrast  02/26/2016  CLINICAL DATA:  Initial evaluation for acute dizziness. EXAM: MRI HEAD WITHOUT CONTRAST TECHNIQUE: Multiplanar, multiecho pulse sequences of the brain and surrounding structures were obtained without intravenous contrast. COMPARISON:  None. FINDINGS: Study mildly degraded by motion artifact. Cerebral volume within normal limits for patient age. Mild scattered T2/FLAIR hyperintensity within the periventricular and deep white matter both cerebral hemispheres most consistent with chronic small vessel ischemic disease. There is an acute/early subacute ischemic infarct involving the right thalamus that measures 13 x 23 x 24 mm (series 3, image 27). No significant mass effect. No associated hemorrhage. No other acute infarct. Gray-white matter differentiation otherwise maintained. Major intracranial vascular flow voids are preserved. No mass lesion, midline shift, or mass effect. No hydrocephalus. No extra-axial fluid collection. No acute or chronic intracranial  hemorrhage. Major dural sinuses are grossly patent. Craniocervical junction normal. Visualized upper cervical spine unremarkable. Pituitary gland within normal limits. No acute abnormality about the orbits. Sequela prior bilateral lens extraction noted. Mild scattered mucosal thickening within the ethmoidal air cells. Paranasal sinuses are otherwise clear. No mastoid effusion. Inner ear structures grossly normal. Bone marrow signal intensity within normal limits. No scalp soft tissue abnormality. IMPRESSION: 1. 13 x 23 x 24 mm acute/early subacute ischemic nonhemorrhagic right thalamic infarct. No associated mass effect. 2. Mild chronic small vessel ischemic disease. Electronically Signed   By: Jeannine Boga M.D.   On: 02/26/2016 22:32    Time Spent in minutes  25   Louellen Molder M.D on 02/27/2016 at 4:04 PM  Between 7am to 7pm - Pager - [phone]  After 7pm go to [email or web address] - password [facility]  [facility] -  Office  [phone]

## 2016-02-27 NOTE — Evaluation (Signed)
Physical Therapy Evaluation Patient Details Name: Mark Stewart MRN: [id number] DOB: 09/02/1946 Today's Date: 02/27/2016   History of Present Illness  Pt is a 70 y/o male who presents to the ED with a 2 week history of fatigue. MRI revealed acute R ischemic thalamic infarction.   Clinical Impression  Pt admitted with above diagnosis. Pt currently with functional limitations due to the deficits listed below (see PT Problem List). At the time of PT eval pt was able to perform transfers and ambulation with supervision to modified independence. Pt reports feeling of poor coordination of the LLE and would like to further test the LLE for deficits next session. Pt will benefit from skilled PT to increase their independence and safety with mobility to allow discharge to the venue listed below.       Follow Up Recommendations Outpatient PT;Supervision for mobility/OOB    Equipment Recommendations  None recommended by PT    Recommendations for Other Services       Precautions / Restrictions Precautions Precautions: Fall Restrictions Weight Bearing Restrictions: No      Mobility  Bed Mobility Overal bed mobility: Modified Independent             General bed mobility comments: Increased time. HOB flat and rails lowered to simulate home environment.   Transfers Overall transfer level: Modified independent Equipment used: None             General transfer comment: No assist required. No unsteadiness noted.   Ambulation/Gait Ambulation/Gait assistance: Supervision Ambulation Distance (Feet): 500 Feet Assistive device: None Gait Pattern/deviations: Step-through pattern;Decreased stride length;Trunk flexed Gait velocity: Decreased Gait velocity interpretation: Below normal speed for age/gender General Gait Details: Pt appears to be ambulating well. He states that his LLE feels "different" than the R and agrees it feels less coordinated when therapist asked. Pt mildly unsteady  but no assist was required to maintain balance during gait training. Wife reports improvement since admission.   Stairs            Wheelchair Mobility    Modified Rankin (Stroke Patients Only)       Balance Overall balance assessment: Needs assistance Sitting-balance support: Feet supported;No upper extremity supported Sitting balance-Leahy Scale: Good     Standing balance support: No upper extremity supported;During functional activity Standing balance-Leahy Scale: Fair Standing balance comment: Dyanmically                             Pertinent Vitals/Pain Pain Assessment: No/denies pain    Home Living Family/patient expects to be discharged to:: Private residence Living Arrangements: Spouse/significant other Available Help at Discharge: Family;Available 24 hours/day Type of Home: House Home Access: Stairs to enter Entrance Stairs-Rails: None Entrance Stairs-Number of Steps: 2-3 Home Layout: One level Home Equipment: Shower seat - built in;Walker - 2 wheels;Bedside commode;Toilet riser      Prior Function Level of Independence: Independent         Comments: Still drives, community ambulator     Hand Dominance   Dominant Hand: Right    Extremity/Trunk Assessment   Upper Extremity Assessment: Overall WFL for tasks assessed           Lower Extremity Assessment: LLE deficits/detail   LLE Deficits / Details: Pt reports a feeling of LLE "lagging behind" during gait training. Did not note any gross strength or coordination deficits during testing.   Cervical / Trunk Assessment: Normal  Communication   Communication: No  difficulties  Cognition Arousal/Alertness: Awake/alert Behavior During Therapy: WFL for tasks assessed/performed Overall Cognitive Status: Within Functional Limits for tasks assessed                      General Comments      Exercises        Assessment/Plan    PT Assessment Patient needs continued PT  services  PT Diagnosis Abnormality of gait   PT Problem List Decreased strength;Decreased range of motion;Decreased activity tolerance;Decreased balance;Decreased mobility;Decreased knowledge of use of DME;Decreased knowledge of precautions;Decreased safety awareness  PT Treatment Interventions DME instruction;Gait training;Stair training;Functional mobility training;Therapeutic activities;Therapeutic exercise;Neuromuscular re-education;Patient/family education   PT Goals (Current goals can be found in the Care Plan section) Acute Rehab PT Goals Patient Stated Goal: Back to normal PT Goal Formulation: With patient/family Time For Goal Achievement: 03/05/16 Potential to Achieve Goals: Good    Frequency Min 4X/week   Barriers to discharge        Co-evaluation               End of Session Equipment Utilized During Treatment: Gait belt Activity Tolerance: Patient tolerated treatment well Patient left: in chair;with call bell/phone within reach;with family/visitor present Nurse Communication: Mobility status         Time: [id number] PT Time Calculation (min) (ACUTE ONLY): 18 min   Charges:   PT Evaluation $PT Eval Moderate Complexity: 1 Procedure     PT G Codes:        Rolinda Roan 02/29/2016, 2:58 PM   Rolinda Roan, PT, DPT Acute Rehabilitation Services Pager: [phone]

## 2016-02-27 NOTE — Progress Notes (Signed)
Occupational Therapy Evaluation Patient Details Name: HALLARD MCCULLAR MRN: [id number] DOB: 19-Aug-1946 Today's Date: 02/27/2016    History of Present Illness Pt is a 70 y/o male who presents to the ED with a 2 week history of fatigue. MRI revealed acute R ischemic thalamic infarction.    Clinical Impression   PTA, pt independent with ADL and mobility. Pt presents with mild coordination deficits with LUE. Plan to follow up tommorow to complete education on HEP and reducing risk of falls. Pt safe to D/C home with intermittent S of wife when medically stable.     Follow Up Recommendations  No OT follow up;Supervision - Intermittent    Equipment Recommendations  None recommended by OT    Recommendations for Other Services       Precautions / Restrictions Precautions Precautions: Fall Restrictions Weight Bearing Restrictions: No      Mobility Bed Mobility Overal bed mobility: Modified Independent             General bed mobility comments: Increased time. HOB flat and rails lowered to simulate home environment.   Transfers Overall transfer level: Modified independent Equipment used: None             General transfer comment: No assist required. No unsteadiness noted.     Balance Overall balance assessment: Needs assistance Sitting-balance support: Feet supported;No upper extremity supported Sitting balance-Leahy Scale: Good     Standing balance support: No upper extremity supported;During functional activity Standing balance-Leahy Scale: Fair Standing balance comment: Dyanmically                            ADL Overall ADL's : At baseline      Recommended pt use showerchair - has built in                                       Vision Vision Assessment?: Yes Eye Alignment: Within Functional Limits Alignment/Gaze Preference: Within Defined Limits Tracking/Visual Pursuits: Able to track stimulus in all quads without  difficulty Saccades: Within functional limits Convergence: Within functional limits Visual Fields: Other (comment) (hx of peripheral field loss)   Perception     Praxis      Pertinent Vitals/Pain Pain Assessment: No/denies pain     Hand Dominance Right   Extremity/Trunk Assessment Upper Extremity Assessment Upper Extremity Assessment: LUE deficits/detail LUE Deficits / Details: mild coordinaiton deficits LUE Coordination: decreased fine motor;decreased gross motor   Lower Extremity Assessment Lower Extremity Assessment: Defer to PT evaluation LLE Deficits / Details: Pt reports a feeling of LLE "lagging behind" during gait training. Did not note any gross strength or coordination deficits during testing.    Cervical / Trunk Assessment Cervical / Trunk Assessment: Normal   Communication Communication Communication: No difficulties   Cognition Arousal/Alertness: Awake/alert Behavior During Therapy: WFL for tasks assessed/performed Overall Cognitive Status: Within Functional Limits for tasks assessed                     General Comments       Exercises       Shoulder Instructions      Home Living Family/patient expects to be discharged to:: Private residence Living Arrangements: Spouse/significant other Available Help at Discharge: Family;Available 24 hours/day Type of Home: House Home Access: Stairs to enter [employer] of Steps: 2-3 Entrance Stairs-Rails: None Home Layout: One  level     Bathroom Shower/Tub: Occupational psychologist: Handicapped height     Home Equipment: Shower seat - built in;Walker - 2 wheels;Bedside commode;Toilet riser      Lives With: Spouse    Prior Functioning/Environment Level of Independence: Independent        Comments: Still drives, community ambulator    OT Diagnosis: Other (comment) (coordination deficits)   OT Problem List: Decreased coordination;Impaired UE functional use   OT  Treatment/Interventions: Self-care/ADL training;Therapeutic exercise;Patient/family education    OT Goals(Current goals can be found in the care plan section) Acute Rehab OT Goals Patient Stated Goal: Back to normal OT Goal Formulation: With patient Time For Goal Achievement: 03/05/16 Potential to Achieve Goals: Good ADL Goals Pt/caregiver will Perform Home Exercise Program: Left upper extremity;Both right and left upper extremity;With written HEP provided;With Supervision (fine motor/coordinaiotn and BUE integrated HEP) Additional ADL Goal #1: Pt will verbbalize 3 strategies to reduce risk of falls.  OT Frequency: Min 2X/week   Barriers to D/C:            Co-evaluation              End of Session Nurse Communication: Mobility status  Activity Tolerance: Patient tolerated treatment well Patient left: in bed;with call bell/phone within reach;with family/visitor present   Time: 1720-1741 OT Time Calculation (min): 21 min Charges:  OT General Charges $OT Visit: 1 Procedure OT Evaluation $OT Eval Moderate Complexity: 1 Procedure G-Codes:    Toriana Sponsel,HILLARY 2016/03/25, 5:55 PM   [facility], OTR/L  [phone] 2016/03/25

## 2016-02-27 NOTE — Consult Note (Signed)
Admission H&P    Chief Complaint: Dizziness and unstable gait for 4 days.  HPI: Mark Stewart is an 70 y.o. male with a history of diabetes mellitus, hypertension and hyperlipidemia presenting with a complaint of dizziness and unstable gait for about 4 days. He was initially seen in the ED at [facility], and subsequently transferred here for further management. MRI of his brain showed acute right ischemic thalamic infarction. He's had no change in speech. Family has noted slight droop of left side of his face. He has not experienced weakness nor sensory changes involving extremities. He has no previous history of stroke nor TIA. He has not been taking aspirin consistently on a daily basis. NIH stroke score was 2.  LSN: 02/22/2016 tPA Given: No: Beyond time under for treatment consideration mRankin:  Past Medical History  Diagnosis Date  . Hypertension   . Diabetes mellitus without complication ([place])   . Hypercholesteremia     Past Surgical History  Procedure Laterality Date  . Prostate surgery    . Eye surgery    . Splenectomy      Family History  Problem Relation Age of Onset  . Breast cancer Mother   . Diabetes Mellitus II Father   . Breast cancer Sister    Social History:  reports that he has quit smoking. He does not have any smokeless tobacco history on file. He reports that he does not drink alcohol or use illicit drugs.  Allergies:  Allergies  Allergen Reactions  . Benazepril Cough  . Penicillins Rash    Has patient had a PCN reaction causing immediate rash, facial/tongue/throat swelling, SOB or lightheadedness with hypotension: yes Has patient had a PCN reaction causing severe rash involving mucus membranes or skin necrosis: no Has patient had a PCN reaction that required hospitalization: no Has patient had a PCN reaction occurring within the last 10 years: no If all of the above answers are "NO", then may proceed with Cephalosporin use.      Medications Prior to Admission  Medication Sig Dispense Refill  . Amlodipine-Valsartan-HCTZ 10-160-12.5 MG TABS Take 1 tablet by mouth daily.  11  . aspirin 81 MG chewable tablet Chew 81 mg by mouth daily.    Marland Kitchen atorvastatin (LIPITOR) 40 MG tablet Take 40 mg by mouth daily.  11  . carvedilol (COREG) 25 MG tablet Take 25 mg by mouth 2 (two) times daily.  11  . cloNIDine (CATAPRES) 0.1 MG tablet Take 0.5 mg by mouth 2 (two) times daily.  5  . fluticasone (FLONASE) 50 MCG/ACT nasal spray Place 2 sprays into both nostrils daily as needed for allergies or rhinitis.    . Insulin Detemir (LEVEMIR FLEXTOUCH) 100 UNIT/ML Pen Inject 24 Units into the skin daily.    . metFORMIN (GLUCOPHAGE) 1000 MG tablet Take 1,000 mg by mouth 2 (two) times daily with a meal.      ROS: History obtained from spouse and the patient  General ROS: negative for - chills, fatigue, fever, night sweats, weight gain or weight loss Psychological ROS: negative for - behavioral disorder, hallucinations, memory difficulties, mood swings or suicidal ideation Ophthalmic ROS: negative for - blurry vision, double vision, eye pain or loss of vision ENT ROS: negative for - epistaxis, nasal discharge, oral lesions, sore throat, tinnitus or vertigo Allergy and Immunology ROS: negative for - hives or itchy/watery eyes Hematological and Lymphatic ROS: negative for - bleeding problems, bruising or swollen lymph nodes Endocrine ROS: negative for - galactorrhea, hair  pattern changes, polydipsia/polyuria or temperature intolerance Respiratory ROS: negative for - cough, hemoptysis, shortness of breath or wheezing Cardiovascular ROS: negative for - chest pain, dyspnea on exertion, edema or irregular heartbeat Gastrointestinal ROS: negative for - abdominal pain, diarrhea, hematemesis, nausea/vomiting or stool incontinence Genito-Urinary ROS: negative for - dysuria, hematuria, incontinence or urinary frequency/urgency Musculoskeletal ROS:  negative for - joint swelling or muscular weakness Neurological ROS: as noted in HPI Dermatological ROS: negative for rash and skin lesion changes  Physical Examination: Blood pressure 211/75, pulse 57, temperature 98 F (36.7 C), temperature source Oral, resp. rate 18, SpO2 96 %.  HEENT-  Normocephalic, no lesions, without obvious abnormality.  Normal external eye and conjunctiva.  Normal TM's bilaterally.  Normal auditory canals and external ears. Normal external nose, mucus membranes and septum.  Normal pharynx. Neck supple with no masses, nodes, nodules or enlargement. Cardiovascular - regular rate and rhythm, S1, S2 normal, no murmur, click, rub or gallop Lungs - chest clear, no wheezing, rales, normal symmetric air entry Abdomen - soft, non-tender; bowel sounds normal; no masses,  no organomegaly Extremities - no joint deformities, effusion, or inflammation and no edema  Neurologic Examination: Mental Status: Alert, oriented, thought content appropriate.  Speech fluent without evidence of aphasia. Able to follow commands without difficulty. Cranial Nerves: II-able to count fingers accurately and upper visual fields bilaterally; difficulty with peripheral vision of lower visual fields. III/IV/VI-Pupils were equal and reacted normally to light. Extraocular movements were full and conjugate.    V/VII-no facial numbness and no facial weakness. VIII-normal. X-normal speech and symmetrical palatal movement. XI: trapezius strength/neck flexion strength normal bilaterally XII-midline tongue extension with normal strength. Motor: 5/5 bilaterally with normal tone and bulk Sensory: Normal throughout. Deep Tendon Reflexes: 1+ and symmetric. Plantars: Flexor bilaterally Cerebellar: Normal finger-to-nose testing. Carotid auscultation: Normal  Results for orders placed or performed during the hospital encounter of 02/26/16 (from the past 48 hour(s))  CBG monitoring, ED     Status: Abnormal    Collection Time: 02/26/16  4:35 PM  Result Value Ref Range   Glucose-Capillary 415 (H) 65 - 99 mg/dL  Basic metabolic panel     Status: Abnormal   Collection Time: 02/26/16  4:47 PM  Result Value Ref Range   Sodium 134 (L) 135 - 145 mmol/L   Potassium 3.9 3.5 - 5.1 mmol/L   Chloride 98 (L) 101 - 111 mmol/L   CO2 29 22 - 32 mmol/L   Glucose, Bld 417 (H) 65 - 99 mg/dL   BUN 13 6 - 20 mg/dL   Creatinine, Ser 0.76 0.61 - 1.24 mg/dL   Calcium 8.8 (L) 8.9 - 10.3 mg/dL   GFR calc non Af Amer >60 >60 mL/min   GFR calc Af Amer >60 >60 mL/min    Comment: (NOTE) The eGFR has been calculated using the CKD EPI equation. This calculation has not been validated in all clinical situations. eGFR's persistently <60 mL/min signify possible Chronic Kidney Disease.    Anion gap 7 5 - 15  CBC     Status: None   Collection Time: 02/26/16  4:47 PM  Result Value Ref Range   WBC 7.7 4.0 - 10.5 K/uL   RBC 4.86 4.22 - 5.81 MIL/uL   Hemoglobin 15.1 13.0 - 17.0 g/dL   HCT 43.7 39.0 - 52.0 %   MCV 89.9 78.0 - 100.0 fL   MCH 31.1 26.0 - 34.0 pg   MCHC 34.6 30.0 - 36.0 g/dL   RDW 12.8 11.5 -  15.5 %   Platelets 278 150 - 400 K/uL  Urinalysis, Routine w reflex microscopic     Status: Abnormal   Collection Time: 02/26/16  6:56 PM  Result Value Ref Range   Color, Urine YELLOW YELLOW   APPearance CLEAR CLEAR   Specific Gravity, Urine 1.038 (H) 1.005 - 1.030   pH 7.0 5.0 - 8.0   Glucose, UA >1000 (A) NEGATIVE mg/dL   Hgb urine dipstick NEGATIVE NEGATIVE   Bilirubin Urine NEGATIVE NEGATIVE   Ketones, ur NEGATIVE NEGATIVE mg/dL   Protein, ur 30 (A) NEGATIVE mg/dL   Nitrite NEGATIVE NEGATIVE   Leukocytes, UA NEGATIVE NEGATIVE  Urine microscopic-add on     Status: None   Collection Time: 02/26/16  6:56 PM  Result Value Ref Range   Squamous Epithelial / LPF NONE SEEN NONE SEEN   WBC, UA NONE SEEN 0 - 5 WBC/hpf   RBC / HPF NONE SEEN 0 - 5 RBC/hpf   Bacteria, UA NONE SEEN NONE SEEN  POC CBG, ED      Status: Abnormal   Collection Time: 02/26/16 10:54 PM  Result Value Ref Range   Glucose-Capillary 192 (H) 65 - 99 mg/dL   Comment 1 Notify RN   POC CBG, ED     Status: Abnormal   Collection Time: 02/27/16 12:21 AM  Result Value Ref Range   Glucose-Capillary 204 (H) 65 - 99 mg/dL   Comment 1 Notify RN   POC CBG, ED     Status: Abnormal   Collection Time: 02/27/16  1:22 AM  Result Value Ref Range   Glucose-Capillary 259 (H) 65 - 99 mg/dL   Comment 1 Notify RN   POC CBG, ED     Status: Abnormal   Collection Time: 02/27/16  3:01 AM  Result Value Ref Range   Glucose-Capillary 246 (H) 65 - 99 mg/dL   Comment 1 Notify RN    Dg Chest 2 View  02/26/2016  CLINICAL DATA:  Lethargy, dizziness, diabetes. EXAM: CHEST  2 VIEW COMPARISON:  Chest x-ray dated 02/24/2016 and chest x-ray dated 01/13/2016. FINDINGS: Heart size is normal. Overall cardiomediastinal silhouette is stable in size and configuration. Lungs are clear. No evidence of pneumonia. No pleural effusion or pneumothorax seen. Osseous structures about the chest are unremarkable. IMPRESSION: Lungs are clear and there is no evidence of acute cardiopulmonary abnormality. Electronically Signed   By: Franki Cabot M.D.   On: 02/26/2016 21:16   Mr Brain Wo Contrast  02/26/2016  CLINICAL DATA:  Initial evaluation for acute dizziness. EXAM: MRI HEAD WITHOUT CONTRAST TECHNIQUE: Multiplanar, multiecho pulse sequences of the brain and surrounding structures were obtained without intravenous contrast. COMPARISON:  None. FINDINGS: Study mildly degraded by motion artifact. Cerebral volume within normal limits for patient age. Mild scattered T2/FLAIR hyperintensity within the periventricular and deep white matter both cerebral hemispheres most consistent with chronic small vessel ischemic disease. There is an acute/early subacute ischemic infarct involving the right thalamus that measures 13 x 23 x 24 mm (series 3, image 27). No significant mass effect. No  associated hemorrhage. No other acute infarct. Gray-white matter differentiation otherwise maintained. Major intracranial vascular flow voids are preserved. No mass lesion, midline shift, or mass effect. No hydrocephalus. No extra-axial fluid collection. No acute or chronic intracranial hemorrhage. Major dural sinuses are grossly patent. Craniocervical junction normal. Visualized upper cervical spine unremarkable. Pituitary gland within normal limits. No acute abnormality about the orbits. Sequela prior bilateral lens extraction noted. Mild scattered mucosal thickening within  the ethmoidal air cells. Paranasal sinuses are otherwise clear. No mastoid effusion. Inner ear structures grossly normal. Bone marrow signal intensity within normal limits. No scalp soft tissue abnormality. IMPRESSION: 1. 13 x 23 x 24 mm acute/early subacute ischemic nonhemorrhagic right thalamic infarct. No associated mass effect. 2. Mild chronic small vessel ischemic disease. Electronically Signed   By: Benjamin  McClintock M.D.   On: 02/26/2016 22:32    Assessment: 69 y.o. male with multiple risk factors for stroke presenting with acute right thalamic ischemic infarction.  Stroke Risk Factors - diabetes mellitus, hyperlipidemia and hypertension  Plan: 1. HgbA1c, fasting lipid panel 2. MRI, MRA  of the brain without contrast 3. PT consult, OT consult, Speech consult 4. Echocardiogram 5. Carotid dopplers 6. Prophylactic therapy-Antiplatelet med: Aspirin  7. Risk factor modification 8. Telemetry monitoring  C.R. , MD [facility] [phone]  02/27/2016, 6:02 AM     

## 2016-02-27 NOTE — Evaluation (Signed)
Clinical/Bedside Swallow Evaluation Patient Details  Name: Mark Stewart MRN: [id number] Date of Birth: 01/01/1946  Today's Date: 02/27/2016 Time: SLP Start Time (ACUTE ONLY): 0830 SLP Stop Time (ACUTE ONLY): 0845 SLP Time Calculation (min) (ACUTE ONLY): 15 min  Past Medical History:  Past Medical History  Diagnosis Date  . Hypertension   . Diabetes mellitus without complication ([place])   . Hypercholesteremia    Past Surgical History:  Past Surgical History  Procedure Laterality Date  . Prostate surgery    . Eye surgery    . Splenectomy     HPI:  The patient came in with the chief complaint of dizziness and unstable gait for 4 days.  Mark Stewart is an 70 y.o. male with a history of diabetes mellitus, hypertension and hyperlipidemia presenting with a complaint of dizziness and unstable gait for about 4 days.  MRI of his brain showed acute right ischemic thalamic infarction. He's had no change in speech. Family has noted slight droop of left side of his face. He has not experienced weakness nor sensory changes involving extremities. He has no previous history of stroke nor TIA. He has not been taking aspirin consistently on a daily basis. NIH stroke score was 2. Most current chest x-ray is negative for acute findings.     Assessment / Plan / Recommendation Clinical Impression  Clinical swallowing evaluation was completed thin liquids via spoon, cup and straw, purees and dry solids.  Oral mechanism exam was unremarkable.  The patient's oral and pharyngeal swallow appeared to be functional.  Swallow trigger was timely and hyo-laryngeal excursion was judged to be adequate.  Mastication of dry solids was functional.  No overt s/s of aspiration were seen.  Recommend a regular diet and thin liquids.  ST follow up is not indicated.      Aspiration Risk  No limitations    Diet Recommendation   Regular diet with thin liquids.    Medication Administration: Whole meds with liquid    Other   Recommendations Oral Care Recommendations: Oral care BID   Follow up Recommendations  None      Swallow Study   General Date of Onset: 02/26/16 HPI: The patient came in with the chief complaint of dizziness and unstable gait for 4 days.  Mark Stewart is an 70 y.o. male with a history of diabetes mellitus, hypertension and hyperlipidemia presenting with a complaint of dizziness and unstable gait for about 4 days.  MRI of his brain showed acute right ischemic thalamic infarction. He's had no change in speech. Family has noted slight droop of left side of his face. He has not experienced weakness nor sensory changes involving extremities. He has no previous history of stroke nor TIA. He has not been taking aspirin consistently on a daily basis. NIH stroke score was 2. Most current chest x-ray is negative for acute findings.   Type of Study: Bedside Swallow Evaluation Previous Swallow Assessment: None known.   Diet Prior to this Study: NPO Temperature Spikes Noted: No Respiratory Status: Room air History of Recent Intubation: No Behavior/Cognition: Alert;Cooperative;Pleasant mood Oral Cavity Assessment: Within Functional Limits Oral Care Completed by SLP: No Oral Cavity - Dentition: Adequate natural dentition Vision: Functional for self-feeding Self-Feeding Abilities: Able to feed self Patient Positioning: Upright in bed Baseline Vocal Quality: Normal Volitional Cough: Strong Volitional Swallow: Able to elicit    Oral/Motor/Sensory Function Overall Oral Motor/Sensory Function: Within functional limits   Ice Chips Ice chips: Not tested   Thin  Liquid Thin Liquid: Within functional limits Presentation: Self Fed;Spoon;Straw;Cup    Nectar Thick Nectar Thick Liquid: Not tested   Honey Thick Honey Thick Liquid: Not tested   Puree Puree: Within functional limits Presentation: Self Fed   Solid   GO   Solid: Within functional limits Presentation: Self Fed    Functional Assessment Tool  Used: ASHA NOMS and clinical judgment.   Functional Limitations: Swallowing Swallow Current Status [id number]): 0 percent impaired, limited or restricted Swallow Goal Status [id number]): 0 percent impaired, limited or restricted Swallow Discharge Status [phone]): 0 percent impaired, limited or restricted  Shelly Flatten, [place], [facility] [phone]  Shelly Flatten N 02/27/2016,9:07 AM

## 2016-02-27 NOTE — Progress Notes (Signed)
PT. Admitted to room 523 from [place]. Alert and oriented x 4 Moe x4. NIH score of "0". Pt has some weakness on left side, but moved his extremities w/o problems.B/p 211/75 MD notified P-58. No c/o pain.no skin breakdown noted, just buising to bilateral arms.Pt aware and demonstrated proper use of call bed and knows to alert staff when ambulating.Family at the bedside.

## 2016-02-27 NOTE — ED Notes (Signed)
[facility] called. 

## 2016-02-27 NOTE — Progress Notes (Signed)
Echocardiogram 2D Echocardiogram has been performed.  Mark Stewart 02/27/2016, 11:35 AM

## 2016-02-27 NOTE — Evaluation (Signed)
Speech Language Pathology Evaluation Patient Details Name: Mark Stewart MRN: [id number] DOB: 11-22-1945 Today's Date: 02/27/2016 Time: [id number] SLP Time Calculation (min) (ACUTE ONLY): 15 min  Problem List:  Patient Active Problem List   Diagnosis Date Noted  . Hyperlipidemia 02/27/2016  . Hypertension 02/27/2016  . Type 2 diabetes mellitus ([place]) 02/27/2016  . Acute CVA (cerebrovascular accident) ([place]) 02/26/2016   Past Medical History:  Past Medical History  Diagnosis Date  . Hypertension   . Diabetes mellitus without complication ([place])   . Hypercholesteremia    Past Surgical History:  Past Surgical History  Procedure Laterality Date  . Prostate surgery    . Eye surgery    . Splenectomy     HPI:  The patient came in with the chief complaint of dizziness and unstable gait for 4 days.  Mark Stewart is an 70 y.o. male with a history of diabetes mellitus, hypertension and hyperlipidemia presenting with a complaint of dizziness and unstable gait for about 4 days.  MRI of his brain showed acute right ischemic thalamic infarction. He's had no change in speech. Family has noted slight droop of left side of his face. He has not experienced weakness nor sensory changes involving extremities. He has no previous history of stroke nor TIA. He has not been taking aspirin consistently on a daily basis. NIH stroke score was 2. Most current chest x-ray is negative for acute findings.     Assessment / Plan / Recommendation Clinical Impression  Cognitive/linguistic evaluation was completed using the Mini Mental State Exam.  the patient achieved a score of 27/30.  Points lost for delayed recall and orientation to the full date.  The patient reported issues with memory prior to this event and stated that he has strategies in place to facilitate recall at home (eg: use of calendar for appts, pill box for meds etc).  Motor speech evaluation was unremarkable and the patient is reporting that his  speech is back to baseline.  Acute ST needs are not identified.  ST to sign off.  Thank you for the consult.      SLP Assessment  Patient does not need any further Speech Lanaguage Pathology Services    Follow Up Recommendations  None          SLP Evaluation Prior Functioning  Cognitive/Linguistic Baseline: Within functional limits Type of Home: House  Lives With: Spouse Vocation: Part time employment   Cognition  Overall Cognitive Status: Within Functional Limits for tasks assessed Arousal/Alertness: Awake/alert Orientation Level: Oriented X4 Attention: Focused Focused Attention: Appears intact Memory: Impaired Memory Impairment: Decreased recall of new information (Immediate recall 3/3.  Recall given delay 1/3.  ) Awareness: Appears intact Problem Solving: Appears intact Safety/Judgment: Appears intact    Comprehension  Auditory Comprehension Overall Auditory Comprehension: Appears within functional limits for tasks assessed Yes/No Questions: Within Functional Limits Commands: Within Functional Limits Conversation: Complex Reading Comprehension Reading Status: Within funtional limits    Expression Expression Primary Mode of Expression: Verbal Verbal Expression Overall Verbal Expression: Appears within functional limits for tasks assessed Initiation: No impairment Automatic Speech: Name;Social Response Level of Generative/Spontaneous Verbalization: Conversation Repetition: No impairment Naming: No impairment Pragmatics: No impairment Non-Verbal Means of Communication: Not applicable Written Expression Dominant Hand: Right Written Expression: Within Functional Limits   Oral / Motor  Oral Motor/Sensory Function Overall Oral Motor/Sensory Function: Within functional limits Motor Speech Overall Motor Speech: Appears within functional limits for tasks assessed Respiration: Within functional limits Phonation: Normal Resonance: Within  functional  limits Articulation: Within functional limitis Intelligibility: Intelligible Motor Planning: Witnin functional limits Motor Speech Errors: Not applicable   GO          Functional Assessment Tool Used: ASHA NOMS and clinical judgment.   Functional Limitations: Swallowing Swallow Current Status [id number]): 0 percent impaired, limited or restricted Swallow Goal Status [id number]): 0 percent impaired, limited or restricted Swallow Discharge Status [id number]): 0 percent impaired, limited or restricted          Shelly Flatten, MA, [place] [phone] Lamar Sprinkles 02/27/2016, 9:14 AM

## 2016-02-27 NOTE — Progress Notes (Signed)
UR COMPLETED  

## 2016-02-27 NOTE — ED Notes (Signed)
[facility] arrived  

## 2016-02-27 NOTE — Progress Notes (Signed)
STROKE TEAM PROGRESS NOTE   HISTORY OF PRESENT ILLNESS (per record) Mark Stewart is an 70 y.o. male with a history of diabetes mellitus, hypertension and hyperlipidemia presenting with a complaint of dizziness and unstable gait for about 4 days. He was initially seen in the ED at [facility], and subsequently transferred to [facility] for further management. MRI of his brain showed acute right ischemic thalamic infarction. He's had no change in speech. Family has noted slight droop of left side of his face. He has not experienced weakness nor sensory changes involving extremities. He has no previous history of stroke nor TIA. He has not been taking aspirin consistently on a daily basis. NIH stroke score was 2. He was LKW 02/22/2016, time unclear.  Patient was not administered IV t-PA secondary to delay in arrival. He was admitted for further evaluation and treatment.   SUBJECTIVE (INTERVAL HISTORY) His wife is at the bedside.  Overall he feels his condition is rapidly improving. He stated that his symptoms much improved. He stopped ASA for the last 2-3 weeks because of his nose bleeding. It is not severe. He also used vaseline as well as stopped ASA, nose bleeding resolved.    OBJECTIVE Temp:  [98 F (36.7 C)-98.1 F (36.7 C)] 98 F (36.7 C) (05/22 0450) Pulse Rate:  [56-75] 57 (05/22 0450) Cardiac Rhythm:  [-] Normal sinus rhythm (05/22 0700) Resp:  [16-18] 18 (05/22 0450) BP: (171-211)/(75-163) 185/91 mmHg (05/22 0702) SpO2:  [95 %-100 %] 96 % (05/22 0450)  CBC:  Recent Labs Lab 02/26/16 1647 02/27/16 0536  WBC 7.7 6.6  HGB 15.1 14.2  HCT 43.7 42.9  MCV 89.9 91.9  PLT 278 [id number]    Basic Metabolic Panel:  Recent Labs Lab 02/26/16 1647 02/27/16 0536  NA 134* 137  K 3.9 3.7  CL 98* 103  CO2 29 28  GLUCOSE 417* 251*  BUN 13 8  CREATININE 0.76 0.71  CALCIUM 8.8* 8.5*    Lipid Panel:    Component Value Date/Time   CHOL 121 02/27/2016 0536   TRIG 82 02/27/2016 0536    HDL 34* 02/27/2016 0536   CHOLHDL 3.6 02/27/2016 0536   VLDL 16 02/27/2016 0536   LDLCALC 71 02/27/2016 0536   HgbA1c: No results found for: HGBA1C Urine Drug Screen: No results found for: LABOPIA, COCAINSCRNUR, LABBENZ, AMPHETMU, THCU, LABBARB    IMAGING I have personally reviewed the radiological images below and agree with the radiology interpretations.  Dg Chest 2 View 02/26/2016  Lungs are clear and there is no evidence of acute cardiopulmonary abnormality.   Ct Angio Neck W/cm &/or Wo/cm 02/27/2016   1. No acute arterial finding. 2. Cervical carotid atherosclerosis without flow limiting stenosis. 3. Moderate atheromatous narrowing of the dominant left vertebral artery ostium. 4. Focal high-grade right P4 branch stenosis.   Mr Brain Wo Contrast 02/26/2016  1. 13 x 23 x 24 mm acute/early subacute ischemic nonhemorrhagic right thalamic infarct. No associated mass effect. 2. Mild chronic small vessel ischemic disease.   TTE - - Left ventricle: The cavity size was mildly dilated. Wall  thickness was normal. Systolic function was normal. The estimated  ejection fraction was in the range of 55% to 60%. Doppler  parameters are consistent with abnormal left ventricular  relaxation (grade 1 diastolic dysfunction). - Mitral valve: Calcified annulus. Mildly thickened leaflets .   PHYSICAL EXAM Physical exam  Temp:  [98 F (36.7 C)-98.6 F (37 C)] 98.4 F (36.9 C) (05/22 2013) Pulse  Rate:  [57-76] 66 (05/22 2013) Resp:  [16-18] 16 (05/22 2013) BP: (158-211)/(69-163) 170/71 mmHg (05/22 2013) SpO2:  [95 %-98 %] 95 % (05/22 2013) Weight:  [180 lb (81.647 kg)] 180 lb (81.647 kg) (05/22 1100)  General - Well nourished, well developed, in no apparent distress.  Ophthalmologic - Fundi not visualized due to eye movement.  Cardiovascular - Regular rate and rhythm with no murmur.  Mental Status -  Level of arousal and orientation to time, place, and person were intact. Language  including expression, naming, repetition, comprehension was assessed and found intact. Fund of Knowledge was assessed and was intact.  Cranial Nerves II - XII - II - Visual field intact OU. III, IV, VI - Extraocular movements intact. V - Facial sensation intact bilaterally. VII - Facial movement intact bilaterally. VIII - Hearing & vestibular intact bilaterally. X - Palate elevates symmetrically. XI - Chin turning & shoulder shrug intact bilaterally. XII - Tongue protrusion intact.  Motor Strength - The patient's strength was normal in all extremities and pronator drift was absent.  Bulk was normal and fasciculations were absent.   Motor Tone - Muscle tone was assessed at the neck and appendages and was normal.  Reflexes - The patient's reflexes were 1+ in all extremities and he had no pathological reflexes.  Sensory - Light touch, temperature/pinprick were assessed and were symmetrical.    Coordination - The patient had normal movements in the hands and feet with no ataxia or dysmetria.  Tremor was absent.  Gait and Station - not tested.   ASSESSMENT/PLAN Mark Stewart is a 70 y.o. male with history of HTN, HLD, DM and seasonal allergies presenting with dizziness and unstable gait x 4 days. He did not receive IV t-PA due to delay in arrival.   Stroke:  right thalamic infarct secondary to small vessel disease source  Resultant  resolved  MRI  R thalamic infarct  CTA head/neck - No significant large vessel finding. High grade stenosis R P4  2D Echo  EF 55-60%  LDL 71  HgbA1c pending  Lovenox 40 mg sq daily for VTE prophylaxis  Diet heart healthy/carb modified Room service appropriate?: Yes; Fluid consistency:: Thin   He was ordered to be on aspirin 81 mg daily prior to admission but he was not taking regularly, now on aspirin 325 mg daily.   Patient counseled to be compliant with his antithrombotic medications  Ongoing aggressive stroke risk factor  management  Therapy recommendations:  pending   Disposition:  Pending  Epistaxis   Mild  Has used vaseline for nose moisture  Pt is willing to try ASA again and still use vaseline  Hypertension  Elevated, as high as 189/91 and 211/75 Permissive hypertension (OK if < 220/120) but gradually normalize in 5-7 days  Hyperlipidemia  Home meds:  lipitor 40, not yest resumed in hospital  LDL 71, goal < 70  Resume statin lipitor but decreased to 20  Continue statin at discharge  Diabetes  HgbA1c pending , goal < 7.0  CBG not controlled. Today ABG 200s to 300s  SSI  Other Stroke Risk Factors  Advanced age  Former Cigarette smoker  Hospital day # 1  Neurology will sign off. Please call with questions. Pt will follow up with [name] at [facility] in about 2 months. Thanks for the consult.   Rosalin Hawking, MD PhD Stroke Neurology 02/27/2016 10:47 PM    To contact Stroke Continuity provider, please refer to [email or web address] After hours, contact  General Neurology

## 2016-02-27 NOTE — ED Notes (Signed)
Hospitalist at bedside. Received aspirin from pharmacy. Will medicate when hospitalist is finished

## 2016-02-28 DIAGNOSIS — E1165 Type 2 diabetes mellitus with hyperglycemia: Secondary | ICD-10-CM

## 2016-02-28 DIAGNOSIS — I10 Essential (primary) hypertension: Secondary | ICD-10-CM | POA: Diagnosis present

## 2016-02-28 DIAGNOSIS — Z794 Long term (current) use of insulin: Secondary | ICD-10-CM

## 2016-02-28 LAB — GLUCOSE, CAPILLARY
Glucose-Capillary: 126 mg/dL — ABNORMAL HIGH (ref 65–99)
Glucose-Capillary: 148 mg/dL — ABNORMAL HIGH (ref 65–99)

## 2016-02-28 LAB — HEMOGLOBIN A1C
Hgb A1c MFr Bld: 10.7 % — ABNORMAL HIGH (ref 4.8–5.6)
Mean Plasma Glucose: 260 mg/dL

## 2016-02-28 MED ORDER — ASPIRIN EC 325 MG PO TBEC: 325.0000 mg | DELAYED_RELEASE_TABLET | Freq: Every day | ORAL | Status: DC

## 2016-02-28 MED ORDER — ATORVASTATIN CALCIUM 20 MG PO TABS: 20.0000 mg | ORAL_TABLET | Freq: Every day | ORAL | Status: DC

## 2016-02-28 MED ORDER — INSULIN DETEMIR 100 UNIT/ML ~~LOC~~ SOLN
35.0000 [IU] | Freq: Every day | SUBCUTANEOUS | Status: DC
  Filled 2016-02-28: qty 0.35

## 2016-02-28 MED ORDER — INSULIN ASPART 100 UNIT/ML ~~LOC~~ SOLN: 6.0000 [IU] | Freq: Three times a day (TID) | SUBCUTANEOUS | Status: DC

## 2016-02-28 MED ORDER — INSULIN DETEMIR 100 UNIT/ML FLEXPEN: 35.0000 [IU] | PEN_INJECTOR | Freq: Every day | SUBCUTANEOUS | Status: DC

## 2016-02-28 MED ORDER — INSULIN ASPART 100 UNIT/ML ~~LOC~~ SOLN
6.0000 [IU] | Freq: Three times a day (TID) | SUBCUTANEOUS | Status: DC
  Administered 2016-02-28 (×2): 6 [IU] via SUBCUTANEOUS

## 2016-02-28 NOTE — Discharge Instructions (Signed)
Stroke Prevention Some medical conditions and behaviors are associated with an increased chance of having a stroke. You may prevent a stroke by making healthy choices and managing medical conditions. HOW CAN I REDUCE MY RISK OF HAVING A STROKE?   Stay physically active. Get at least 30 minutes of activity on most or all days.  Do not smoke. It may also be helpful to avoid exposure to secondhand smoke.  Limit alcohol use. Moderate alcohol use is considered to be:  No more than 2 drinks per day for men.  No more than 1 drink per day for nonpregnant women.  Eat healthy foods. This involves:  Eating 5 or more servings of fruits and vegetables a day.  Making dietary changes that address high blood pressure (hypertension), high cholesterol, diabetes, or obesity.  Manage your cholesterol levels.  Making food choices that are high in fiber and low in saturated fat, trans fat, and cholesterol may control cholesterol levels.  Take any prescribed medicines to control cholesterol as directed by your health care provider.  Manage your diabetes.  Controlling your carbohydrate and sugar intake is recommended to manage diabetes.  Take any prescribed medicines to control diabetes as directed by your health care provider.  Control your hypertension.  Making food choices that are low in salt (sodium), saturated fat, trans fat, and cholesterol is recommended to manage hypertension.  Ask your health care provider if you need treatment to lower your blood pressure. Take any prescribed medicines to control hypertension as directed by your health care provider.  If you are 18-39 years of age, have your blood pressure checked every 3-5 years. If you are 40 years of age or older, have your blood pressure checked every year.  Maintain a healthy weight.  Reducing calorie intake and making food choices that are low in sodium, saturated fat, trans fat, and cholesterol are recommended to manage  weight.  Stop drug abuse.  Avoid taking birth control pills.  Talk to your health care provider about the risks of taking birth control pills if you are over 35 years old, smoke, get migraines, or have ever had a blood clot.  Get evaluated for sleep disorders (sleep apnea).  Talk to your health care provider about getting a sleep evaluation if you snore a lot or have excessive sleepiness.  Take medicines only as directed by your health care provider.  For some people, aspirin or blood thinners (anticoagulants) are helpful in reducing the risk of forming abnormal blood clots that can lead to stroke. If you have the irregular heart rhythm of atrial fibrillation, you should be on a blood thinner unless there is a good reason you cannot take them.  Understand all your medicine instructions.  Make sure that other conditions (such as anemia or atherosclerosis) are addressed. SEEK IMMEDIATE MEDICAL CARE IF:   You have sudden weakness or numbness of the face, arm, or leg, especially on one side of the body.  Your face or eyelid droops to one side.  You have sudden confusion.  You have trouble speaking (aphasia) or understanding.  You have sudden trouble seeing in one or both eyes.  You have sudden trouble walking.  You have dizziness.  You have a loss of balance or coordination.  You have a sudden, severe headache with no known cause.  You have new chest pain or an irregular heartbeat. Any of these symptoms may represent a serious problem that is an emergency. Do not wait to see if the symptoms will   go away. Get medical help at once. Call your local emergency services (911 in [place] Do not drive yourself to the hospital.   This information is not intended to replace advice given to you by your health care provider. Make sure you discuss any questions you have with your health care provider.   Document Released: 11/01/2004 Document Revised: 10/15/2014 Document Reviewed:  03/27/2013 Elsevier Interactive Patient Education 2016 [employer]  

## 2016-02-28 NOTE — Evaluation (Signed)
Occupational Therapy Evaluation and Discharge Patient Details Name: Mark Stewart MRN: [id number] DOB: 10-07-1946 Today's Date: 02/28/2016    History of Present Illness Pt is a 70 y/o male who presents to the ED with a 2 week history of fatigue. MRI revealed acute R ischemic thalamic infarction.    Clinical Impression   Pt doing very well with all fine motor tasks and with balance during adls.  All goals met.  HEP provided for pt to continue working on fine motor tasks and B UE coordination tasks.  Pt independent with HEP.  No further acute OT needs.    Follow Up Recommendations  No OT follow up;Supervision - Intermittent    Equipment Recommendations  None recommended by OT    Recommendations for Other Services       Precautions / Restrictions Precautions Precautions: Fall Restrictions Weight Bearing Restrictions: No      Mobility Bed Mobility Overal bed mobility: Modified Independent             General bed mobility comments: no assist needed.  Transfers Overall transfer level: Independent Equipment used: None             General transfer comment: No assist required. No unsteadiness noted.     Balance Overall balance assessment: Needs assistance Sitting-balance support: Feet supported Sitting balance-Leahy Scale: Good     Standing balance support: During functional activity;No upper extremity supported Standing balance-Leahy Scale: Fair Standing balance comment: Pt walked in hallway picking up items from floor. Pt does seem to flop L foot down heavier than R when ambulating.  Pt did not lose balance but S provided.                            ADL Overall ADL's : At baseline                                       General ADL Comments: completes adls in room w/o assist.     Vision     Perception     Praxis      Pertinent Vitals/Pain Pain Assessment: No/denies pain     Hand Dominance     Extremity/Trunk  Assessment             Communication     Cognition Arousal/Alertness: Awake/alert Behavior During Therapy: WFL for tasks assessed/performed Overall Cognitive Status: Within Functional Limits for tasks assessed                     General Comments       Exercises Exercises: Other exercises Other Exercises Other Exercises: Fine Motor HEP provided and reviewed. Pt was independent.  Pt opening small packages on tray, laced shoes Ily. Other Exercises: Pt given theraputty and theraband to promote B hand tasks and L UE fine motor tasks.  Pt independent with HEP.   Shoulder Instructions      Home Living                                          Prior Functioning/Environment               OT Diagnosis:     OT Problem List:     OT Treatment/Interventions:  OT Goals(Current goals can be found in the care plan section) Acute Rehab OT Goals Patient Stated Goal: Back to normal OT Goal Formulation: With patient Time For Goal Achievement: 03/05/16 Potential to Achieve Goals: Good ADL Goals Pt/caregiver will Perform Home Exercise Program: Left upper extremity;Both right and left upper extremity;With written HEP provided;With Supervision Additional ADL Goal #1: Pt will verbbalize 3 strategies to reduce risk of falls.  OT Frequency:     Barriers to D/C:            Co-evaluation              End of Session Nurse Communication: Mobility status  Activity Tolerance: Patient tolerated treatment well Patient left: in bed;with call bell/phone within reach;with family/visitor present   Time: 3888-2800 OT Time Calculation (min): 28 min Charges:  OT General Charges $OT Visit: 1 Procedure OT Treatments $Self Care/Home Management : 8-22 mins $Therapeutic Exercise: 8-22 mins G-Codes:    Glenford Peers 03/19/2016, 9:35 AM  [phone]

## 2016-02-28 NOTE — Discharge Summary (Signed)
Physician Discharge Summary  Mark Stewart [id number] DOB: [birth year] DOA: 02/26/2016  PCP: Teressa Lower, MD  Admit date: 02/26/2016 Discharge date: 02/28/2016  Time spent: 35 minutes  Recommendations for Outpatient Follow-up:  1. Discharge home with outpatient PT. 2. Follow-up with PCP in 1 week. Outpatient referral to neurology. Please adjust his blood pressure medications and insulin dose for tight blood pressure and diabetic control.   Discharge Diagnoses:  Principal Problem:   Acute ischemic stroke   Active Problems:   Hyperlipidemia   Uncontrolled Hypertension   Poorly controlled diabetes mellitus ([place])   Diabetes mellitus with hyperglycemia, with long-term current use of insulin ([place])   Discharge Condition: Fair  Diet recommendation: Heart healthy/diabetic  Filed Weights   02/27/16 1100  Weight: 81.647 kg (180 lb)    History of present illness:  Please refer to admission H&P for details, in brief, 70 year old male with uncontrolled diabetes mellitus (reports A1c of 11 2 months ago), uncontrolled hypertension and hyperlipidemia presented with dizziness and unsteady gait for 4 days. Patient seen at [facility] [place] and transferred to [facility] for further management. MRI brain showing acute right ischemic thalamic infarct. No TPA given as patient was given therapeutic window for TPA.  Hospital Course:  Acute CVA (cerebrovascular accident) ([place]) Risk factors include uncontrolled hypertension and diabetes. Lipid panel within normal limits.  2-D echo shows normal EF of 55- 60% with grade 1 diastolic dysfunction. No cardiac source of ischemia or emboli. CT Angiogram of the neck shows nonsignificant carotid stenosis Allow permissive blood pressure.. Needs aggressive blood pressure control during outpatient follow-up. Patient was on baby aspirin at home and switch to full dose aspirin upon discharge. Reduced dose to 20 mg daily. Appreciate stroke team follow-up.  Referred to outpatient neurology. Seen by PT and recommends outpatient PT.     Active Problems:  Hyperlipidemia LDL at goal.   Hypertension Allow permissive blood pressure. Needs aggressive blood pressure control as outpatient.   Type 2 diabetes mellitus ([place]), uncontrolled A1c of 10.7. Increased Levemir dose to 35 units daily and added pre-meal aspart coverage. Instructed on dietary adherence and close blood glucose monitoring..      Code Status : Full code  Family Communication : Wife at bedside  Disposition Plan : Home with outpatient follow-up.    Consults : neurology  Procedures : CT angiogram head and neck MRI brain 2-D echo     Recent Labs    Lab Results  Component Value Date   PLT 248 02/27/2016      Antibiotics : None  Anti-infectives    None         Discharge Exam: Filed Vitals:   02/28/16 0020 02/28/16 0528  BP: 188/73 164/79  Pulse: 66 62  Temp: 98 F (36.7 C) 98 F (36.7 C)  Resp: 18 16    Gen: not in distress HEENT: no pallor, moist mucosa, supple neck Chest: clear b/l, no added sounds CVS: N S1&S2, no murmurs, rubs or gallop GI: soft, NT, ND, BS+ Musculoskeletal: warm, no edema CNS: AAOX3, non focal  Discharge Instructions   Discharge Instructions    Ambulatory referral to Neurology    Complete by:  As directed   Follow up with [name], NP, at [facility] in about 2 months. Thanks.          Current Discharge Medication List    START taking these medications   Details  aspirin EC 325 MG tablet Take 1 tablet (325 mg total) by mouth daily. Qty:  30 tablet, Refills: 0    insulin aspart (NOVOLOG) 100 UNIT/ML injection Inject 6 Units into the skin 3 (three) times daily before meals. Qty: 10 mL, Refills: 11      CONTINUE these medications which have CHANGED   Details  atorvastatin (LIPITOR) 20 MG tablet Take 1 tablet (20 mg total) by mouth daily. Qty: 30 tablet, Refills: 0    Insulin Detemir  (LEVEMIR FLEXTOUCH) 100 UNIT/ML Pen Inject 35 Units into the skin daily. Qty: 15 mL, Refills: 11      CONTINUE these medications which have NOT CHANGED   Details  Amlodipine-Valsartan-HCTZ 10-160-12.5 MG TABS Take 1 tablet by mouth daily. Refills: 11    carvedilol (COREG) 25 MG tablet Take 25 mg by mouth 2 (two) times daily. Refills: 11    cloNIDine (CATAPRES) 0.1 MG tablet Take 0.5 mg by mouth 2 (two) times daily. Refills: 5    fluticasone (FLONASE) 50 MCG/ACT nasal spray Place 2 sprays into both nostrils daily as needed for allergies or rhinitis.    metFORMIN (GLUCOPHAGE) 1000 MG tablet Take 1,000 mg by mouth 2 (two) times daily with a meal.      STOP taking these medications     aspirin 81 MG chewable tablet        Allergies  Allergen Reactions  . Benazepril Cough  . Penicillins Rash    Has patient had a PCN reaction causing immediate rash, facial/tongue/throat swelling, SOB or lightheadedness with hypotension: yes Has patient had a PCN reaction causing severe rash involving mucus membranes or skin necrosis: no Has patient had a PCN reaction that required hospitalization: no Has patient had a PCN reaction occurring within the last 10 years: no If all of the above answers are "NO", then may proceed with Cephalosporin use.    Follow-up Information    Follow up with Dennie Bible, NP. Schedule an appointment as soon as possible for a visit in 2 months.   Specialty:  Family Medicine   Why:  stroke clinic   Contact information:   [address] [place] [address] [phone]       Follow up with Bassett, MD. Schedule an appointment as soon as possible for a visit in 1 week.   Specialty:  Family Medicine   Contact information:   [address] [place] [address] [phone]        The results of significant diagnostics from this hospitalization (including imaging, microbiology, ancillary and laboratory) are listed below for  reference.    Significant Diagnostic Studies: Ct Angio Head W/cm &/or Wo Cm  02/27/2016  CLINICAL DATA:  Dizziness for 2 weeks. EXAM: CT ANGIOGRAPHY HEAD AND NECK TECHNIQUE: Multidetector CT imaging of the head and neck was performed using the standard protocol during bolus administration of intravenous contrast. Multiplanar CT image reconstructions and MIPs were obtained to evaluate the vascular anatomy. Carotid stenosis measurements (when applicable) are obtained utilizing NASCET criteria, using the distal internal carotid diameter as the denominator. CONTRAST:  50 cc Isovue 370 intravenous COMPARISON:  None. FINDINGS: CT HEAD Brain: Known perforator infarct affecting the right thalamus. No superimposed hemorrhage. No notable ischemic changes elsewhere. No hydrocephalus. Calvarium and skull base: Negative Paranasal sinuses: Clear Orbits: Bilateral optic drusen.  Bilateral cataract resection. CTA NECK Aortic arch: No aneurysm or dissection.  Three vessel branching Right carotid system: Predominately calcified plaque at the bifurcation without stenosis greater than 50%. No dissection or plaque ulceration noted. Left carotid system: Predominant noncalcified plaque along the mid common  carotid and bifurcation without flow limiting stenosis or dissection. No plaque ulceration. Vertebral arteries:Left dominant. Moderate atheromatous narrowing at the left ostia (503: 145). There is prominent plaque on the proximal left subclavian artery without stenosis greater than 50%. Positive remodeling at the subclavian atherosclerosis. Smooth and patent right vertebral artery. Skeleton: No contributory finding. Other neck: No incidental mass in the neck. Symmetric prominence of lingual tonsils. CTA HEAD Anterior circulation: Intact circle-of-Willis with large communicating arteries. No major branch occlusion or flow limiting stenosis. Negative for aneurysm. Posterior circulation: Left dominant. Fetal type PCA bilaterally. Right  P4 branch stenosis noted 503: 127 and 506:13. Otherwise widely patent. No regularity along the right posterior communicating artery or P1 segment to correlate with acute infarct. Venous sinuses: Patent Anatomic variants: Intact circle-of-Willis.  Fetal type PCA. Delayed phase: No parenchymal enhancement or mass. IMPRESSION: 1. No acute arterial finding. 2. Cervical carotid atherosclerosis without flow limiting stenosis. 3. Moderate atheromatous narrowing of the dominant left vertebral artery ostium. 4. Focal high-grade right P4 branch stenosis. Electronically Signed   By: Monte Fantasia M.D.   On: 02/27/2016 10:57   Dg Chest 2 View  02/26/2016  CLINICAL DATA:  Lethargy, dizziness, diabetes. EXAM: CHEST  2 VIEW COMPARISON:  Chest x-ray dated 02/24/2016 and chest x-ray dated 01/13/2016. FINDINGS: Heart size is normal. Overall cardiomediastinal silhouette is stable in size and configuration. Lungs are clear. No evidence of pneumonia. No pleural effusion or pneumothorax seen. Osseous structures about the chest are unremarkable. IMPRESSION: Lungs are clear and there is no evidence of acute cardiopulmonary abnormality. Electronically Signed   By: Franki Cabot M.D.   On: 02/26/2016 21:16   Ct Angio Neck W/cm &/or Wo/cm  02/27/2016  CLINICAL DATA:  Dizziness for 2 weeks. EXAM: CT ANGIOGRAPHY HEAD AND NECK TECHNIQUE: Multidetector CT imaging of the head and neck was performed using the standard protocol during bolus administration of intravenous contrast. Multiplanar CT image reconstructions and MIPs were obtained to evaluate the vascular anatomy. Carotid stenosis measurements (when applicable) are obtained utilizing NASCET criteria, using the distal internal carotid diameter as the denominator. CONTRAST:  50 cc Isovue 370 intravenous COMPARISON:  None. FINDINGS: CT HEAD Brain: Known perforator infarct affecting the right thalamus. No superimposed hemorrhage. No notable ischemic changes elsewhere. No hydrocephalus.  Calvarium and skull base: Negative Paranasal sinuses: Clear Orbits: Bilateral optic drusen.  Bilateral cataract resection. CTA NECK Aortic arch: No aneurysm or dissection.  Three vessel branching Right carotid system: Predominately calcified plaque at the bifurcation without stenosis greater than 50%. No dissection or plaque ulceration noted. Left carotid system: Predominant noncalcified plaque along the mid common carotid and bifurcation without flow limiting stenosis or dissection. No plaque ulceration. Vertebral arteries:Left dominant. Moderate atheromatous narrowing at the left ostia (503: 145). There is prominent plaque on the proximal left subclavian artery without stenosis greater than 50%. Positive remodeling at the subclavian atherosclerosis. Smooth and patent right vertebral artery. Skeleton: No contributory finding. Other neck: No incidental mass in the neck. Symmetric prominence of lingual tonsils. CTA HEAD Anterior circulation: Intact circle-of-Willis with large communicating arteries. No major branch occlusion or flow limiting stenosis. Negative for aneurysm. Posterior circulation: Left dominant. Fetal type PCA bilaterally. Right P4 branch stenosis noted 503: 127 and 506:13. Otherwise widely patent. No regularity along the right posterior communicating artery or P1 segment to correlate with acute infarct. Venous sinuses: Patent Anatomic variants: Intact circle-of-Willis.  Fetal type PCA. Delayed phase: No parenchymal enhancement or mass. IMPRESSION: 1. No acute arterial finding. 2. Cervical  carotid atherosclerosis without flow limiting stenosis. 3. Moderate atheromatous narrowing of the dominant left vertebral artery ostium. 4. Focal high-grade right P4 branch stenosis. Electronically Signed   By: Monte Fantasia M.D.   On: 02/27/2016 10:57   Mr Brain Wo Contrast  02/26/2016  CLINICAL DATA:  Initial evaluation for acute dizziness. EXAM: MRI HEAD WITHOUT CONTRAST TECHNIQUE: Multiplanar, multiecho  pulse sequences of the brain and surrounding structures were obtained without intravenous contrast. COMPARISON:  None. FINDINGS: Study mildly degraded by motion artifact. Cerebral volume within normal limits for patient age. Mild scattered T2/FLAIR hyperintensity within the periventricular and deep white matter both cerebral hemispheres most consistent with chronic small vessel ischemic disease. There is an acute/early subacute ischemic infarct involving the right thalamus that measures 13 x 23 x 24 mm (series 3, image 27). No significant mass effect. No associated hemorrhage. No other acute infarct. Gray-white matter differentiation otherwise maintained. Major intracranial vascular flow voids are preserved. No mass lesion, midline shift, or mass effect. No hydrocephalus. No extra-axial fluid collection. No acute or chronic intracranial hemorrhage. Major dural sinuses are grossly patent. Craniocervical junction normal. Visualized upper cervical spine unremarkable. Pituitary gland within normal limits. No acute abnormality about the orbits. Sequela prior bilateral lens extraction noted. Mild scattered mucosal thickening within the ethmoidal air cells. Paranasal sinuses are otherwise clear. No mastoid effusion. Inner ear structures grossly normal. Bone marrow signal intensity within normal limits. No scalp soft tissue abnormality. IMPRESSION: 1. 13 x 23 x 24 mm acute/early subacute ischemic nonhemorrhagic right thalamic infarct. No associated mass effect. 2. Mild chronic small vessel ischemic disease. Electronically Signed   By: Jeannine Boga M.D.   On: 02/26/2016 22:32    Microbiology: No results found for this or any previous visit (from the past 240 hour(s)).   Labs: Basic Metabolic Panel:  Recent Labs Lab 02/26/16 1647 02/27/16 0536  NA 134* 137  K 3.9 3.7  CL 98* 103  CO2 29 28  GLUCOSE 417* 251*  BUN 13 8  CREATININE 0.76 0.71  CALCIUM 8.8* 8.5*   Liver Function Tests:  Recent  Labs Lab 02/27/16 0536  AST 14*  ALT 16*  ALKPHOS 72  BILITOT 0.8  PROT 6.4*  ALBUMIN 3.1*   No results for input(s): LIPASE, AMYLASE in the last 168 hours. No results for input(s): AMMONIA in the last 168 hours. CBC:  Recent Labs Lab 02/26/16 1647 02/27/16 0536  WBC 7.7 6.6  HGB 15.1 14.2  HCT 43.7 42.9  MCV 89.9 91.9  PLT 278 248   Cardiac Enzymes: No results for input(s): CKTOTAL, CKMB, CKMBINDEX, TROPONINI in the last 168 hours. BNP: BNP (last 3 results) No results for input(s): BNP in the last 8760 hours.  ProBNP (last 3 results) No results for input(s): PROBNP in the last 8760 hours.  CBG:  Recent Labs Lab 02/27/16 0613 02/27/16 1250 02/27/16 1652 02/27/16 2117 02/28/16 0814  GLUCAP 237* 317* 266* 268* 126*       Signed:  Louellen Molder MD.  [facility] 02/28/2016, 11:06 AM

## 2016-02-28 NOTE — Progress Notes (Signed)
Physical Therapy Treatment Patient Details Name: Mark Stewart MRN: [id number] DOB: 13-Feb-1946 Today's Date: 02/28/2016    History of Present Illness Pt is a 70 y/o male who presents to the ED with a 2 week history of fatigue. MRI revealed acute R ischemic thalamic infarction.     PT Comments    Pt continues to do well with PT. Currently supervision/mod I but continues to demo some balance deficits and generalized weakness and will benefit from OPPT to maximize independence and safety with mobility.   Follow Up Recommendations  Outpatient PT;Supervision for mobility/OOB     Equipment Recommendations  None recommended by PT    Recommendations for Other Services       Precautions / Restrictions Precautions Precautions: Fall Restrictions Weight Bearing Restrictions: No    Mobility  Bed Mobility                  Transfers Overall transfer level: Modified independent Equipment used: None             General transfer comment: pt standing in room with wife upon arrival  Ambulation/Gait Ambulation/Gait assistance: Supervision Ambulation Distance (Feet): 350 Feet Assistive device: None Gait Pattern/deviations: Step-through pattern;Decreased stride length Gait velocity: Decreased   General Gait Details: pt with increased cadence and improved gait mechanics this session; a little unsteady with L head turns but pt able to recover without assist; no unsteadiness noted with vertical head turns, changes in velocity, turning, or sudden stops   Science writer    Modified Rankin (Stroke Patients Only)       Balance Overall balance assessment: Needs assistance Sitting-balance support: No upper extremity supported;Feet supported Sitting balance-Leahy Scale: Good     Standing balance support: No upper extremity supported;During functional activity Standing balance-Leahy Scale: Good                      Cognition  Arousal/Alertness: Awake/alert Behavior During Therapy: WFL for tasks assessed/performed Overall Cognitive Status: Within Functional Limits for tasks assessed                      Exercises      General Comments        Pertinent Vitals/Pain Pain Assessment: No/denies pain    Home Living                      Prior Function            PT Goals (current goals can now be found in the care plan section) Acute Rehab PT Goals Patient Stated Goal: Back to normal PT Goal Formulation: With patient/family Time For Goal Achievement: 03/05/16 Potential to Achieve Goals: Good Progress towards PT goals: Progressing toward goals    Frequency  Min 4X/week    PT Plan Current plan remains appropriate    Co-evaluation             End of Session Equipment Utilized During Treatment: Gait belt Activity Tolerance: Patient tolerated treatment well Patient left: with call bell/phone within reach;with family/visitor present (sitting EOB with wife)     Time: [id number] PT Time Calculation (min) (ACUTE ONLY): 14 min  Charges:  $Gait Training: 8-22 mins                    G Codes:      Salina April, PTA  Pager: [phone]   02/28/2016, 4:31 PM

## 2016-03-02 DIAGNOSIS — Z794 Long term (current) use of insulin: Secondary | ICD-10-CM | POA: Diagnosis not present

## 2016-03-02 DIAGNOSIS — E118 Type 2 diabetes mellitus with unspecified complications: Secondary | ICD-10-CM | POA: Diagnosis not present

## 2016-03-02 DIAGNOSIS — I639 Cerebral infarction, unspecified: Secondary | ICD-10-CM | POA: Diagnosis not present

## 2016-03-08 DIAGNOSIS — H47323 Drusen of optic disc, bilateral: Secondary | ICD-10-CM | POA: Diagnosis not present

## 2016-03-08 DIAGNOSIS — E113293 Type 2 diabetes mellitus with mild nonproliferative diabetic retinopathy without macular edema, bilateral: Secondary | ICD-10-CM | POA: Diagnosis not present

## 2016-03-08 DIAGNOSIS — H35372 Puckering of macula, left eye: Secondary | ICD-10-CM | POA: Diagnosis not present

## 2016-03-12 DIAGNOSIS — R531 Weakness: Secondary | ICD-10-CM | POA: Diagnosis not present

## 2016-03-12 DIAGNOSIS — R269 Unspecified abnormalities of gait and mobility: Secondary | ICD-10-CM | POA: Diagnosis not present

## 2016-03-12 DIAGNOSIS — R2681 Unsteadiness on feet: Secondary | ICD-10-CM | POA: Diagnosis not present

## 2016-03-16 DIAGNOSIS — R269 Unspecified abnormalities of gait and mobility: Secondary | ICD-10-CM | POA: Diagnosis not present

## 2016-03-16 DIAGNOSIS — R2681 Unsteadiness on feet: Secondary | ICD-10-CM | POA: Diagnosis not present

## 2016-03-16 DIAGNOSIS — R531 Weakness: Secondary | ICD-10-CM | POA: Diagnosis not present

## 2016-03-21 DIAGNOSIS — R531 Weakness: Secondary | ICD-10-CM | POA: Diagnosis not present

## 2016-03-21 DIAGNOSIS — R2681 Unsteadiness on feet: Secondary | ICD-10-CM | POA: Diagnosis not present

## 2016-03-21 DIAGNOSIS — R269 Unspecified abnormalities of gait and mobility: Secondary | ICD-10-CM | POA: Diagnosis not present

## 2016-03-22 ENCOUNTER — Telehealth: Payer: Self-pay | Admitting: Neurology

## 2016-03-22 NOTE — Telephone Encounter (Signed)
Pt's wife called said since he has been home she has noticed some memory problems, slow in processing thoughts, he seems to be in a fog, does not have a good perception of time (she said he text his son at 1am thinking it was 11pm), he sleeps a lot (she said anytime he sits down he goes to sleep). Please call. Thank you

## 2016-03-22 NOTE — Telephone Encounter (Signed)
I called the number back [phone] and nobody picked up but the sound and tone with the number seems to be a fax number, not able to leave VM. I tried the other one on file [phone] and was told the number has been disconnected. Katrina, can you find out a good number? Thanks.   Rosalin Hawking, MD PhD Stroke Neurology 03/22/2016 4:31 PM

## 2016-03-23 DIAGNOSIS — R2681 Unsteadiness on feet: Secondary | ICD-10-CM | POA: Diagnosis not present

## 2016-03-23 DIAGNOSIS — R531 Weakness: Secondary | ICD-10-CM | POA: Diagnosis not present

## 2016-03-23 DIAGNOSIS — R269 Unspecified abnormalities of gait and mobility: Secondary | ICD-10-CM | POA: Diagnosis not present

## 2016-03-23 NOTE — Telephone Encounter (Signed)
I called again the home phone number and left VM for them to call back.   Rosalin Hawking, MD PhD Stroke Neurology 03/23/2016 1:57 PM

## 2016-03-23 NOTE — Telephone Encounter (Signed)
LFt vm for patients wife on home phone about her husbands memory issus, and sleeping all the time. LFt vm to schedule a sooner appt with Carolyn(NP). Pt already has appt in July for hospital follow up.

## 2016-03-23 NOTE — Telephone Encounter (Signed)
Wife called back and I talked to her and answered her questions. Pt did have memory loss and fatigue in rehab but I told her that likely to be part of the post stroke symptoms. Will need evaluate next appointment. He has appointment with [name] in one month. Will keep the appointment. Wife expressed understanding and appreciation.  Rosalin Hawking, MD PhD Stroke Neurology 03/23/2016 2:31 PM

## 2016-03-27 ENCOUNTER — Other Ambulatory Visit: Payer: Self-pay

## 2016-03-27 NOTE — Patient Outreach (Signed)
[place] [facility]) Care Management  03/27/2016  Mark Stewart 1946-04-25 [id number]  Telephone call to patient regarding [employer] referral.  Unable to reach patient.  HIPAA compliant voice message left with call back phone number.   PLAN:  RNCM will attempt 2nd telephone outreach to patient within 2 weeks.  Quinn Plowman RN,BSN,CCM [facility] Telephonic  [phone]

## 2016-03-28 DIAGNOSIS — R2681 Unsteadiness on feet: Secondary | ICD-10-CM | POA: Diagnosis not present

## 2016-03-28 DIAGNOSIS — R269 Unspecified abnormalities of gait and mobility: Secondary | ICD-10-CM | POA: Diagnosis not present

## 2016-03-28 DIAGNOSIS — R531 Weakness: Secondary | ICD-10-CM | POA: Diagnosis not present

## 2016-03-30 ENCOUNTER — Other Ambulatory Visit: Payer: Self-pay

## 2016-03-30 DIAGNOSIS — R2681 Unsteadiness on feet: Secondary | ICD-10-CM | POA: Diagnosis not present

## 2016-03-30 DIAGNOSIS — R531 Weakness: Secondary | ICD-10-CM | POA: Diagnosis not present

## 2016-03-30 DIAGNOSIS — R269 Unspecified abnormalities of gait and mobility: Secondary | ICD-10-CM | POA: Diagnosis not present

## 2016-03-30 NOTE — Patient Outreach (Signed)
[place] [facility]) Care Management  03/30/2016  PANTH HIEB 04-16-1946 [id number]  Second telephone outreach to patient regarding [employer] referral.  Unable to reach patient. HIPAA compliant voice message left with call back phone number.   PLAN: RNCM will attempt 3rd telephone call within 2 weeks.  Quinn Plowman RN,BSN,CCM [facility] Telephonic  [phone]

## 2016-04-03 DIAGNOSIS — R269 Unspecified abnormalities of gait and mobility: Secondary | ICD-10-CM | POA: Diagnosis not present

## 2016-04-03 DIAGNOSIS — R531 Weakness: Secondary | ICD-10-CM | POA: Diagnosis not present

## 2016-04-03 DIAGNOSIS — R2681 Unsteadiness on feet: Secondary | ICD-10-CM | POA: Diagnosis not present

## 2016-04-04 ENCOUNTER — Other Ambulatory Visit: Payer: Self-pay

## 2016-04-04 NOTE — Patient Outreach (Signed)
[place] [facility]) Care Management  04/04/2016  [name] [birth year] [id number]   REFERRAL SOURCE: [employer]  REFERRAL REASON:  Recent inpatient admission for new stroke, diabetes mellitus A1c 11,  Hypertension and hyperlipidemia.  Patient could benefit from additional education and support with nutrition related to sodium and carbohydrates.   Third telephone call to patient.  Unable to reach. HIPAA compliant voice message left with call back phone number. Telephone call to [name] with [employer] and referral source. Requested assistance with engaging patient to [facility] care management services. [name] states she will attempt contact with patient.    PLAN;  RNCM will send patient outreach letter to attempt contact.  Quinn Plowman RN,BSN,CCM [facility] Telephonic  [phone]

## 2016-04-05 DIAGNOSIS — R2681 Unsteadiness on feet: Secondary | ICD-10-CM | POA: Diagnosis not present

## 2016-04-05 DIAGNOSIS — R269 Unspecified abnormalities of gait and mobility: Secondary | ICD-10-CM | POA: Diagnosis not present

## 2016-04-05 DIAGNOSIS — R531 Weakness: Secondary | ICD-10-CM | POA: Diagnosis not present

## 2016-04-11 DIAGNOSIS — R2681 Unsteadiness on feet: Secondary | ICD-10-CM | POA: Diagnosis not present

## 2016-04-11 DIAGNOSIS — R531 Weakness: Secondary | ICD-10-CM | POA: Diagnosis not present

## 2016-04-11 DIAGNOSIS — R269 Unspecified abnormalities of gait and mobility: Secondary | ICD-10-CM | POA: Diagnosis not present

## 2016-04-12 ENCOUNTER — Other Ambulatory Visit: Payer: Self-pay

## 2016-04-12 NOTE — Patient Outreach (Addendum)
[facility]) Care Management  04/12/2016  Mark Stewart 04/01/1946 [id number]   REFERRAL SOURCE:  [facility] REFERRAL REASON:  Recent admission for new CVA, needs tight blood sugar and blood pressure control. Past medical history of uncontrolled diabetes mellitus, reports A1C 11 approximately 2 months ago.  Assistance with blood pressure/ hyperlipidemia.  Could benefit from additional education and support with nutrition related to sodium and carbohydrates. CONSENT:  Patient primary contact. HIPAA verified by patient.  Patient gave verbal consent to speak with his wife, Mark Stewart as needed regarding all of his medical information.   SUBJECTIVE:  Telephone call to patient regarding [facility] referral. Patient and wife, Mark Stewart on phone together to speak with RNCM. Discussed and offered [facility] care management services.  Patient verbally agreed to receive telephonic case management follow up.  Patient states his main concern is his diabetes and hypertension. Patient reports he was recently in the hospital in May 2017 for stroke. Patient reports he is currently having outpatient physical therapy with [facility] in [place], [place]. Patient states the therapist is helping him work on his balance. Patient states he uses a cane on occasion for support. Patient denies any falls.  Patient states he has had some stumbles. Patient's wife states patient complains of being tired and sleepy often. Patient confirmed. Patient denies any additional symptoms.  Patient states he has a follow up appointment with Dr. Erlinda Stewart, neurologist on 04/26/16 and his next follow up appointment with his primary MD is October 2017.  DIABETES: Patient states he has had diabetes for approximately 20 years. Patient reports he is currently taking oral medication and insulin, novolog and levemir. Patient states he thinks additional nutritional counseling would be beneficial for him. Patient states he checks his blood sugars 3-4  times today. Patient reports today's blood sugar fasting was 222.  Patient states his blood sugars have run from the low 60's to 300 plus. Patient states if his blood sugar gets down to 80 he feels weak, cold sweats, and shaky.  BLOOD PRESSURE:  Patient reports he is monitoring his blood pressures and home and sending the log to his primary doctor every 2 weeks as requested by his doctor.  MEDICATION:  Patient states he takes approximately 9-10 medications per day. Patient states he is able to obtain and afford his medications. Patient denies any medication side effects.  Patient's wife states she takes patient to his appointments and helps to manage the bills.  Wife states she does not feel patient is steady enough to drive at this time.  Patient verbally agreed to next follow up appointment with [facility] . Patient verbally agreed to receive Advance directive information.   ASSESSMENT;  [facility] referral.  Patient will benefit from education and management of diabetes and high blood pressure. Patient states his primary concern is his diabetes currently.  Patients wife states she wants to know how to assist patient with food choices and management of his diabetes/blood pressure.   PLAN:  RNCM will notify Mark Stewart to open patient to [facility] care management services.  RNCM will send patient [facility] care management packet and consent forms. RNCM will send patient welcome letter RNCM will send patients primary MD involvement letter [facility] will send patient educational material:  LIving well with diabetes workbook,  RNCM will send patient Advance directive packet as discussed. RNCM will send patient EMMI education material on diabetes/ hypertension RNCM will follow up with patient within 2 weeks.   Mark Plowman RN,BSN,CCM [facility] Telephonic  [phone]

## 2016-04-17 DIAGNOSIS — R269 Unspecified abnormalities of gait and mobility: Secondary | ICD-10-CM | POA: Diagnosis not present

## 2016-04-17 DIAGNOSIS — R531 Weakness: Secondary | ICD-10-CM | POA: Diagnosis not present

## 2016-04-17 DIAGNOSIS — R2681 Unsteadiness on feet: Secondary | ICD-10-CM | POA: Diagnosis not present

## 2016-04-19 ENCOUNTER — Ambulatory Visit: Payer: Self-pay

## 2016-04-19 DIAGNOSIS — R531 Weakness: Secondary | ICD-10-CM | POA: Diagnosis not present

## 2016-04-19 DIAGNOSIS — R2681 Unsteadiness on feet: Secondary | ICD-10-CM | POA: Diagnosis not present

## 2016-04-19 DIAGNOSIS — R269 Unspecified abnormalities of gait and mobility: Secondary | ICD-10-CM | POA: Diagnosis not present

## 2016-04-24 DIAGNOSIS — R269 Unspecified abnormalities of gait and mobility: Secondary | ICD-10-CM | POA: Diagnosis not present

## 2016-04-24 DIAGNOSIS — R531 Weakness: Secondary | ICD-10-CM | POA: Diagnosis not present

## 2016-04-24 DIAGNOSIS — R2681 Unsteadiness on feet: Secondary | ICD-10-CM | POA: Diagnosis not present

## 2016-04-25 ENCOUNTER — Other Ambulatory Visit: Payer: Self-pay

## 2016-04-25 NOTE — Patient Outreach (Signed)
[facility]) Care Management  04/25/2016  BRUCE WINDMILLER 07-09-1946 [id number]   Telephone call to patient regarding telephone follow up assessment.  Unable to reach patient. HIPAA compliant voice message left with call back phone number.   PLAN;  RNCM will attempt 2nd telephone call to patient within 1 week.  Quinn Plowman RN,BSN,CCM [facility] Telephonic  [phone]

## 2016-04-26 ENCOUNTER — Ambulatory Visit (INDEPENDENT_AMBULATORY_CARE_PROVIDER_SITE_OTHER): Payer: PPO | Admitting: Nurse Practitioner

## 2016-04-26 ENCOUNTER — Encounter: Payer: Self-pay | Admitting: Nurse Practitioner

## 2016-04-26 DIAGNOSIS — Z794 Long term (current) use of insulin: Secondary | ICD-10-CM | POA: Diagnosis not present

## 2016-04-26 DIAGNOSIS — E0865 Diabetes mellitus due to underlying condition with hyperglycemia: Secondary | ICD-10-CM

## 2016-04-26 DIAGNOSIS — I639 Cerebral infarction, unspecified: Secondary | ICD-10-CM

## 2016-04-26 DIAGNOSIS — E785 Hyperlipidemia, unspecified: Secondary | ICD-10-CM | POA: Diagnosis not present

## 2016-04-26 DIAGNOSIS — I6350 Cerebral infarction due to unspecified occlusion or stenosis of unspecified cerebral artery: Secondary | ICD-10-CM

## 2016-04-26 DIAGNOSIS — I10 Essential (primary) hypertension: Secondary | ICD-10-CM | POA: Diagnosis not present

## 2016-04-26 NOTE — Patient Instructions (Signed)
Stressed the importance of management of risk factors to prevent further stroke Continue aspirin for secondary stroke prevention Maintain strict control of hypertension with blood pressure goal below 130/90, today's reading 144/66 continue antihypertensive medications Control of diabetes with hemoglobin A1c below7 followed by primary care most recent hemoglobin A1c10.7  continue diabetic medications Cholesterol with LDL cholesterol less than 70, followed by primary care,  most recent 71  continue Lipitor Exercise by walking, slowly increase , eat healthy diet with whole grains,  fresh fruits and vegetables Continue physical therapy and home exercise program Discussed risk for recurrent stroke/ TIA and answered additional questions Follow-up in 3 months

## 2016-04-26 NOTE — Progress Notes (Signed)
I agree with the assessment and plan as directed by NP .The patient is known to me .   Linzey Ramser, MD  

## 2016-04-26 NOTE — Progress Notes (Signed)
[facility]  PATIENT: Mark Stewart DOB: [birth year]   REASON FOR VISIT: Hospital follow-up for right thalamic infarct secondary to small vessel disease, memory loss since stroke, risk factors of hypertension hyperlipidemia and insulin-dependent diabetes not well controlled HISTORY FROM: Patient    HISTORY OF PRESENT ILLNESS:Mark Stewart is an 70 y.o. male with a history of diabetes mellitus, hypertension and hyperlipidemia presenting with a complaint of dizziness and unstable gait for about 4 days. He was initially seen in the ED at [facility] on 02/27/16  and subsequently transferred to [facility] for further management. MRI of his brain showed acute right ischemic thalamic infarction. He had no change in speech. Family has noted slight droop of left side of his face. He has not experienced weakness nor sensory changes involving extremities. He has no previous history of stroke nor TIA. He has not been taking aspirin consistently on a daily basis. NIH stroke score was 2. He was LKW 02/22/2016, time unclear. Patient was not administered IV t-PA secondary to delay in arrival. He was admitted for further evaluation and treatment.CTA head/neck - No significant large vessel finding. High grade stenosis R P4 2D Echo EF 55-60% LDL 71 hemoglobin A1c of 10.7. He returns for follow-up. He has not had further stroke or TIA symptoms. He is currently getting physical therapy for lower extremity weakness. He is not doing home exercise program at this point. He had recent changes to his insulin dose. He is keeping a log of his blood pressures. He has multiple questions. He returns for reevaluation   REVIEW OF SYSTEMS: Full 14 system review of systems performed and notable only for those listed, all others are neg:  Constitutional: Fatigue  Cardiovascular: neg Ear/Nose/Throat: neg  Skin: neg Eyes: neg Respiratory: neg Gastroitestinal: neg  Hematology/Lymphatic: Easy  bruising Endocrine: neg Musculoskeletal: Walking difficulty Allergy/Immunology: neg Neurological: Memory loss, weakness Psychiatric: Confusion Sleep : neg   ALLERGIES: Allergies  Allergen Reactions  . Benazepril Cough  . Penicillins Rash    Has patient had a PCN reaction causing immediate rash, facial/tongue/throat swelling, SOB or lightheadedness with hypotension: yes Has patient had a PCN reaction causing severe rash involving mucus membranes or skin necrosis: no Has patient had a PCN reaction that required hospitalization: no Has patient had a PCN reaction occurring within the last 10 years: no If all of the above answers are "NO", then may proceed with Cephalosporin use.     HOME MEDICATIONS: Outpatient Prescriptions Prior to Visit  Medication Sig Dispense Refill  . Amlodipine-Valsartan-HCTZ 10-160-12.5 MG TABS Take 1 tablet by mouth daily.  11  . atorvastatin (LIPITOR) 20 MG tablet Take 1 tablet (20 mg total) by mouth daily. 30 tablet 0  . carvedilol (COREG) 25 MG tablet Take 25 mg by mouth 2 (two) times daily.  11  . cloNIDine (CATAPRES) 0.1 MG tablet Take 0.5 mg by mouth 4 (four) times daily -  with meals and at bedtime.   5  . insulin aspart (NOVOLOG) 100 UNIT/ML injection Inject 6 Units into the skin 3 (three) times daily before meals. (Patient taking differently: Inject 10 Units into the skin 3 (three) times daily before meals. ) 10 mL 11  . Insulin Detemir (LEVEMIR FLEXTOUCH) 100 UNIT/ML Pen Inject 35 Units into the skin daily. (Patient taking differently: Inject 10 Units into the skin daily. ) 15 mL 11  . metFORMIN (GLUCOPHAGE) 1000 MG tablet Take 1,000 mg by mouth 2 (two) times daily with a meal.    .  aspirin EC 325 MG tablet Take 1 tablet (325 mg total) by mouth daily. (Patient not taking: Reported on 04/26/2016) 30 tablet 0  . fluticasone (FLONASE) 50 MCG/ACT nasal spray Place 2 sprays into both nostrils daily as needed for allergies or rhinitis. Reported on 04/26/2016      No facility-administered medications prior to visit.    PAST MEDICAL HISTORY: Past Medical History  Diagnosis Date  . Hypertension   . Diabetes mellitus without complication ([place])   . Hypercholesteremia     PAST SURGICAL HISTORY: Past Surgical History  Procedure Laterality Date  . Prostate surgery    . Eye surgery    . Splenectomy      FAMILY HISTORY: Family History  Problem Relation Age of Onset  . Breast cancer Mother   . Diabetes Mellitus II Father   . Breast cancer Sister     SOCIAL HISTORY: Social History   Social History  . Marital Status: Married    Spouse Name: N/A  . Number of Children: N/A  . Years of Education: N/A   Occupational History  . Not on file.   Social History Main Topics  . Smoking status: Former Research scientist (life sciences)  . Smokeless tobacco: Not on file  . Alcohol Use: No  . Drug Use: No  . Sexual Activity: Not on file   Other Topics Concern  . Not on file   Social History Narrative     PHYSICAL EXAM  Filed Vitals:   04/26/16 1247  Height: 6' (1.829 m)  Weight: 184 lb 9.6 oz (83.734 kg)   Body mass index is 25.03 kg/(m^2).  Generalized: Well developed, in no acute distress  Head: normocephalic and atraumatic,. Oropharynx benign  Neck: Supple, no carotid bruits  Cardiac: Regular rate rhythm, no murmur  Musculoskeletal: No deformity   Neurological examination   Mentation: Alert oriented to time, place, history taking. Attention span and concentration appropriate. Recent and remote memory intact.  Follows all commands speech and language fluent. MMSE 30/30. AFT 9. Clock drawing 4/4.  Cranial nerve II-XII: Fundoscopic exam reveals sharp disc margins.Pupils were equal round reactive to light extraocular movements were full, visual field were full on confrontational test. Facial sensation and strength were normal. hearing was intact to finger rubbing bilaterally. Uvula tongue midline. head turning and shoulder shrug were normal and  symmetric.Tongue protrusion into cheek strength was normal. Motor: normal bulk and tone, full strength in the BUE, BLE, fine finger movements normal, no pronator drift. No focal weakness Sensory: normal and symmetric to light touch, pinprick, and  Vibration, in the upper and lower extremities Coordination: finger-nose-finger, heel-to-shin bilaterally, no dysmetria. No tremor Reflexes: 1+ upper lower and symmetric, plantar responses were flexor bilaterally. Gait and Station: Rising up from seated position without assistance, normal stance,  moderate stride, good arm swing, smooth turning, able to perform tiptoe, and heel walking without difficulty. Tandem gait is mildly unsteady. No assistive device  DIAGNOSTIC DATA (LABS, IMAGING, TESTING) - I reviewed patient records, labs, notes, testing and imaging myself where available.  Lab Results  Component Value Date   WBC 6.6 02/27/2016   HGB 14.2 02/27/2016   HCT 42.9 02/27/2016   MCV 91.9 02/27/2016   PLT 248 02/27/2016      Component Value Date/Time   NA 137 02/27/2016 0536   K 3.7 02/27/2016 0536   CL 103 02/27/2016 0536   CO2 28 02/27/2016 0536   GLUCOSE 251* 02/27/2016 0536   BUN 8 02/27/2016 0536   CREATININE 0.71 02/27/2016  0536   CALCIUM 8.5* 02/27/2016 0536   PROT 6.4* 02/27/2016 0536   ALBUMIN 3.1* 02/27/2016 0536   AST 14* 02/27/2016 0536   ALT 16* 02/27/2016 0536   ALKPHOS 72 02/27/2016 0536   BILITOT 0.8 02/27/2016 0536   GFRNONAA >60 02/27/2016 0536   GFRAA >60 02/27/2016 0536   Lab Results  Component Value Date   CHOL 121 02/27/2016   HDL 34* 02/27/2016   LDLCALC 71 02/27/2016   TRIG 82 02/27/2016   CHOLHDL 3.6 02/27/2016   Lab Results  Component Value Date   HGBA1C 10.7* 02/27/2016    ASSESSMENT AND PLAN  70 y.o. year old male  has a past medical history of Hypertension; Diabetes mellitus without complication ([place]); and Hypercholesteremia. here For hospital follow-up for MRI of his brain showed acute  right ischemic thalamic infarction.Marland KitchenCTA head/neck - No significant large vessel finding. High grade stenosis R P4 2D Echo EF 55-60% LDL 71 hemoglobin A1c of 10.7. The patient is a current patient of Dr. [name]  who is out of the office today . This note is sent to the work in doctor.      PLAN: Stressed the importance of management of risk factors to prevent further stroke Continue aspirin for secondary stroke prevention Maintain strict control of hypertension with blood pressure goal below 130/90, today's reading 144/66 continue antihypertensive medications Control of diabetes with hemoglobin A1c below7 followed by primary care most recent hemoglobin A1c10.7  continue diabetic medications recommend seeing endocrinologist for better control Cholesterol with LDL cholesterol less than 70, followed by primary care,  most recent 71  continue Lipitor Exercise by walking, slowly increase , eat healthy diet with whole grains,  fresh fruits and vegetables Continue physical therapy and home exercise program Discussed risk for recurrent stroke/ TIA and answered additional questions for both patient and wife Memory score is stable at 30 out of 30 Follow-up in 3 monthsVst time 40 min Dennie Bible, [facility], [facility], [facility] [address] [place], [place] [address] [phone]

## 2016-04-27 ENCOUNTER — Other Ambulatory Visit: Payer: Self-pay

## 2016-04-27 DIAGNOSIS — R269 Unspecified abnormalities of gait and mobility: Secondary | ICD-10-CM | POA: Diagnosis not present

## 2016-04-27 DIAGNOSIS — R2681 Unsteadiness on feet: Secondary | ICD-10-CM | POA: Diagnosis not present

## 2016-04-27 DIAGNOSIS — R531 Weakness: Secondary | ICD-10-CM | POA: Diagnosis not present

## 2016-04-27 NOTE — Patient Outreach (Signed)
[facility]) Care Management  04/27/2016  Mark Stewart [birth year] [id number]  REFERRAL SOURCE: [facility] REFERRAL REASON: Recent admission for new CVA, needs tight blood sugar and blood pressure control. Past medical history of uncontrolled diabetes mellitus, reports A1C 11 approximately 2 months ago. Assistance with blood pressure/ hyperlipidemia. Could benefit from additional education and support with nutrition related to sodium and carbohydrates.  SUBJECTIVE: Telephone call to patient regarding [facility] referral. HIPAA verified with patient.  Patient states he has an appointment this morning and request to call RNCM back. PLAN:  RNCM will await patients return call,  If no return call, RNCM will attempt 3rd telephone outreach to patient within 1 week.  Quinn Plowman RN,BSN,CCM [facility] Telephonic  [phone]

## 2016-04-30 ENCOUNTER — Other Ambulatory Visit: Payer: Self-pay

## 2016-04-30 DIAGNOSIS — N181 Chronic kidney disease, stage 1: Principal | ICD-10-CM

## 2016-04-30 DIAGNOSIS — E0822 Diabetes mellitus due to underlying condition with diabetic chronic kidney disease: Secondary | ICD-10-CM

## 2016-04-30 NOTE — Patient Outreach (Signed)
New referral/care coordination:  Received new referral today for community outreach.  Placed call to patient who has accepted [facility] program.  Offered home visit for Wednesday 05/02/2016.  PLAN:  Patient has accepted home visit for 05/02/2016. Confirmed address.  Provided my contact information.  Tomasa Rand, RN, BSN, CEN [facility] [employer] [phone]

## 2016-04-30 NOTE — Patient Outreach (Signed)
[facility]) Care Management  [facility] Care Manager  04/30/2016   Mark Stewart 11/07/1945 [id number]   REFERRAL SOURCE: [facility] REFERRAL REASON: Recent admission for new CVA, needs tight blood sugar and blood pressure control. Past medical history of uncontrolled diabetes mellitus, reports A1C 11 approximately 2 months ago. Assistance with blood pressure/ hyperlipidemia. Could benefit from additional education and support with nutrition related to sodium and carbohydrates. CONSENT:  Patient primary contact. HIPAA verified by patient.  Patient gave verbal consent to speak with his wife, Mark Stewart as needed regarding all of his medical information.   Subjective: Telephone call to patient regarding initial assessment;  HIPAA verified with patient. Patient states he is doing ok.  Patient states he received [facility] care management packet and signed and mailed back consent.  Patient states he did not receive the Advance directive packet.  RNCM informed patient that another advance directive packet would be provided to him.  Patient states he had his follow up appointment with the neurologist's nurse practitioner this past week. Patient states the nurse practitioner restarted his aspirin 325mg .  Patient states he continues physical therapy 2 times per week. Patient states he thinks this week is going to be his last.  Patient states his blood sugars and blood pressure continue to be elevated. Patient states he is still checking his blood pressure at least 3 times a day, recording and submitting results to his doctor. Patient states he is waiting for the nurse to call him back from his primary MD office.  Patient states his blood sugar was in the 400's approximately 2 weeks ago. Patient states his primary doctor increased his novolog and levemir.  Patient states this is the only time its been in the 400 range. Patient state he continues to check his blood sugars 3-4 times per day.   Patient states he tries to follow a diabetic and low sodium diet. Patient states this is difficult to do.  Patient states his wife tries to make sure he sticks with it. Patient states he received the EMMI education material and the Diabetic workbook.  Patient states he has looked over some of the information.  RNCM discussed with patient option of [facility] community case manager for additional one on one follow up. Patient verbally agreed to community case manager.   Objective: see completed initial assessment  Encounter Medications:  Outpatient Encounter Prescriptions as of 04/30/2016  Medication Sig Note  . Amlodipine-Valsartan-HCTZ 10-160-12.5 MG TABS Take 1 tablet by mouth daily.   Marland Kitchen aspirin EC 325 MG tablet Take 1 tablet (325 mg total) by mouth daily. 04/30/2016: Patient states he has started back taking Aspirin per his neurologist  . atorvastatin (LIPITOR) 20 MG tablet Take 1 tablet (20 mg total) by mouth daily.   . carvedilol (COREG) 25 MG tablet Take 25 mg by mouth 2 (two) times daily.   . cloNIDine (CATAPRES) 0.1 MG tablet Take 0.5 mg by mouth 4 (four) times daily -  with meals and at bedtime.    . fluticasone (FLONASE) 50 MCG/ACT nasal spray Place 2 sprays into both nostrils daily as needed for allergies or rhinitis. Reported on 04/26/2016   . insulin aspart (NOVOLOG) 100 UNIT/ML injection Inject 6 Units into the skin 3 (three) times daily before meals. (Patient taking differently: Inject 10 Units into the skin 3 (three) times daily before meals. )   . Insulin Detemir (LEVEMIR FLEXTOUCH) 100 UNIT/ML Pen Inject 35 Units into the skin daily. (Patient taking differently: Inject 10  Units into the skin daily. )   . metFORMIN (GLUCOPHAGE) 1000 MG tablet Take 1,000 mg by mouth 2 (two) times daily with a meal.   . Amlodipine-Valsartan-HCTZ 10-160-12.5 MG TABS Take by mouth. 04/26/2016: Received from: [facility]   No facility-administered encounter medications on file as of  04/30/2016.     Functional Status:  In your present state of health, do you have any difficulty performing the following activities: 04/30/2016 02/27/2016  Hearing? [name]  Vision? Y Y  Difficulty concentrating or making decisions? Y N  Walking or climbing stairs? N N  Dressing or bathing? N N  Doing errands, shopping? N N  Preparing Food and eating ? N -  Using the Toilet? N -  In the past six months, have you accidently leaked urine? N -  Do you have problems with loss of bowel control? N -  Managing your Medications? N -  Managing your Finances? N -  Housekeeping or managing your Housekeeping? N -  Some recent data might be hidden    Fall/Depression Screening: PHQ 2/9 Scores 04/30/2016  PHQ - 2 Score 1   Fall Risk  04/30/2016 04/12/2016  Falls in the past year? No No   [facility] CM Care Plan Problem One   Flowsheet Row Most Recent Value  Care Plan Problem One  Knowledge deficit of diabetes related to dietary modifications  Role Documenting the Problem One  Care Management Telephonic Coordinator  Care Plan for Problem One  Active  [facility] Long Term Goal (31-90 days)  Patient will be able to create a menu for the week choosing appropriate foods in the next 60 days  [facility] Long Term Goal Start Date  04/30/16  Interventions for Problem One Long Term Goal  RNCM verified patient received EMMI diabetic education material and Living well with diabtes workbook.    [facility] CM Short Term Goal #1 (0-30 days)  patient will identify 2 foods high in protein within the next 3 weeks  [facility] CM Short Term Goal #1 Start Date  04/30/16  Interventions for Short Term Goal #1  Educate patient about food types:  simple carbs, proteins, fats  [facility] CM Short Term Goal #2 (0-30 days)  patient will log all consumed foods in a journal over the next 30 days  [facility] CM Short Term Goal #2 Start Date  04/30/16  Interventions for Short Term Goal #2  Provide a food journal to patient. Discuss how to log foods in journal      Assessment:  Patient continues to report elevated blood sugars and blood pressures.  Patient would benefit from community case management follow up. Patient verbally agreed to community case management.  Plan: RNCM will refer patient to community case manager. RNCM will send initial assessment to patients primary MD.  Quinn Plowman RN,BSN,CCM [facility] Telephonic  [phone]

## 2016-05-01 ENCOUNTER — Ambulatory Visit: Payer: Self-pay

## 2016-05-01 ENCOUNTER — Telehealth: Payer: Self-pay | Admitting: Nurse Practitioner

## 2016-05-01 DIAGNOSIS — R531 Weakness: Secondary | ICD-10-CM | POA: Diagnosis not present

## 2016-05-01 DIAGNOSIS — R2681 Unsteadiness on feet: Secondary | ICD-10-CM | POA: Diagnosis not present

## 2016-05-01 DIAGNOSIS — R269 Unspecified abnormalities of gait and mobility: Secondary | ICD-10-CM | POA: Diagnosis not present

## 2016-05-01 NOTE — Telephone Encounter (Signed)
I called pt and relayed that CM/NP spoke with dr. [name].  Still recommended taking aspirin and also seeing ENT to evaluate.  Pt verbalized understanding.

## 2016-05-01 NOTE — Telephone Encounter (Signed)
I discussed his frequent nosebleeds with Dr. [name] today. Patient seen last week. He suggests patient be evaluated by ENT . Needs to stay on ASA for secondary stroke prevention

## 2016-05-02 ENCOUNTER — Other Ambulatory Visit: Payer: Self-pay

## 2016-05-02 DIAGNOSIS — E118 Type 2 diabetes mellitus with unspecified complications: Secondary | ICD-10-CM

## 2016-05-02 NOTE — Patient Outreach (Signed)
Care coordination: Placed call to Deanne Coffer [facility] pharmacist) to review concerns about medications.   Plan: will place referral for pharmacy to review medications. Placed call to patient to inform of referral and patient is open to speaking with pharmacist. Patient also reports he is feeling better and current CBG is 287.  Tomasa Rand, RN, BSN, CEN [facility] [employer] [phone]

## 2016-05-02 NOTE — Patient Outreach (Signed)
[facility]) Care Management   05/02/2016  Mark Stewart October 09, 1945 [id number]  Mark Stewart is an 70 y.o. male Arrived for home visit. Wife present Subjective: Patient reports that he is having problems managing his blood pressure and blood sugar since hospital discharge. Reports CBG range from 46-490.  Reports BP as high as 225/100.  Patient reports that he is taking his medications as prescribed. States that he had an insulin adjustment this week.  Patient monitoring BP 2-3 times per day and CBG 3 times per day. Patient reports that since his stroke he has not had the energy like he use to and has notice problems with his left leg.  Reports that he is active with outpatient physical therapy in [place].  Patient describes that he wants to be active like he was before.  Wife reports that they eat out daily and she stuggles with how to help manage patient DM with diet. Patient states that he is not eating sweets and avoids things that are white( bread, potatoes, pasta and rice)  Objective:  Awake and alert. Engaged in conversation about improving his health. During home visit patient felt extremely weak and sweaty. Checked CBG and reading was 48.  Patient ate and drank and CBG after 10 minutes was still 48. ( 46 on this case managers CBG) Patient continued to eat another sandwich and drink a gingerale and CBG came up to 86 after another 15 minutes. Patient was then awake and less sweaty and felt better.  Vitals:   05/02/16 1043  BP: 136/62  Pulse: 66  Resp: 18  SpO2: 98%  Weight: 184 lb (83.5 kg)  Height: 1.829 m (6')   Review of Systems  HENT: Negative.   Eyes:       Reports poor vision when driving  Respiratory: Negative.   Cardiovascular: Negative.   Gastrointestinal: Negative.   Genitourinary: Negative.   Musculoskeletal: Negative.   Skin: Negative.   Neurological: Positive for focal weakness and weakness.       Reports weakness of the left leg   Endo/Heme/Allergies: Negative.   Psychiatric/Behavioral: Negative.     Physical Exam  Constitutional: He is oriented to person, place, and time. He appears well-developed and well-nourished.  Cardiovascular: Normal rate.   Respiratory: Effort normal and breath sounds normal.  Lungs clear  GI: Soft. Bowel sounds are normal.  Musculoskeletal: Normal range of motion. He exhibits no edema.  Feet not examined due to hypoglycemia  Neurological: He is oriented to person, place, and time.  Skin: Skin is warm and dry.  Sweaty during home visit when CBG was low  Psychiatric: He has a normal mood and affect. His behavior is normal. Judgment and thought content normal.    Encounter Medications:   Outpatient Encounter Prescriptions as of 05/02/2016  Medication Sig Note  . Amlodipine-Valsartan-HCTZ 10-160-12.5 MG TABS Take by mouth. 04/26/2016: Received from: [facility]  . aspirin EC 325 MG tablet Take 1 tablet (325 mg total) by mouth daily. 04/30/2016: Patient states he has started back taking Aspirin per his neurologist  . atorvastatin (LIPITOR) 20 MG tablet Take 1 tablet (20 mg total) by mouth daily. (Patient taking differently: Take 20 mg by mouth 2 (two) times daily. )   . carvedilol (COREG) 25 MG tablet Take 25 mg by mouth 2 (two) times daily.   . cloNIDine (CATAPRES) 0.1 MG tablet Take 0.5 mg by mouth 4 (four) times daily -  with meals and at bedtime.    Marland Kitchen  fluticasone (FLONASE) 50 MCG/ACT nasal spray Place 2 sprays into both nostrils daily as needed for allergies or rhinitis. Reported on 04/26/2016   . insulin aspart (NOVOLOG) 100 UNIT/ML injection Inject 6 Units into the skin 3 (three) times daily before meals. (Patient taking differently: Inject 10 Units into the skin 3 (three) times daily before meals. )   . Insulin Detemir (LEVEMIR FLEXTOUCH) 100 UNIT/ML Pen Inject 35 Units into the skin daily. (Patient taking differently: Inject 15 Units into the skin daily. )   .  metFORMIN (GLUCOPHAGE) 1000 MG tablet Take 1,000 mg by mouth 2 (two) times daily with a meal.   . Amlodipine-Valsartan-HCTZ 10-160-12.5 MG TABS Take 1 tablet by mouth daily.    No facility-administered encounter medications on file as of 05/02/2016.     Functional Status:   In your present state of health, do you have any difficulty performing the following activities: 05/02/2016 04/30/2016  Hearing? [name]  Vision? Y Y  Difficulty concentrating or making decisions? [name]  Walking or climbing stairs? N N  Dressing or bathing? N N  Doing errands, shopping? N N  Preparing Food and eating ? N N  Using the Toilet? N N  In the past six months, have you accidently leaked urine? N N  Do you have problems with loss of bowel control? N N  Managing your Medications? N N  Managing your Finances? N N  Housekeeping or managing your Housekeeping? N N  Some recent data might be hidden    Fall/Depression Screening:    PHQ 2/9 Scores 05/02/2016 04/30/2016  PHQ - 2 Score 0 1   Fall Risk  05/02/2016 04/30/2016 04/12/2016  Falls in the past year? No No No    Assessment:   (1) Reviewed purpose of [facility] case management. Explain program in detail. Reviewed consent on file and patient and wife deny any changes needed. (2) poorly controlled DM. Recent hgb A1c of 10.7.  Recent increase insulin. (3) hypoglycemia episode during home visit today. (4) Home blood pressure readings remain high.  Home blood pressure monitor checked with this case manager and home monitor is off about 4 points.  (5)recent stroke. (6) no advanced directives  Plan:  (1) Consent in chart. Provided [facility] calendar to assist with recording of CBG and BP readings. (2) Provided DM education and discussed low card diets. Provided carb tear off sheet, provided [facility] DM folder, Provided samples diets.  Reviewed healthy snack options. Will continue to reassess. (3) Wife assisted patient with eating a sandwich with peanut butter and a [place] sandwich.   Wife gave patient glucose pill, and a ginger ale.  Blood sugar came up and encouraged wife to closely monitor CBG throughout the day and call MD for problems. (4) Reviewed importance of taking medications as prescribed. Reviewed low salt diet and provided low salt tear off for patient and wife. Will refer to [place]. (5) reviewed importance of exercise to gain strength. Encouraged wife and patient to get home exercise instructions from physical therapist. (6) Provided advanced directive packet to patient and reviewed how to complete. Place call to Deanne Coffer [facility] pharmacist) about patients medications and his struggles with blood pressure and DM. Referral made for assessment.  Reviewed plan for telephone follow up in 2 weeks and home visit in 4 weeks. Home visit planned for August 24th.  Care planning and goal setting during home visit. Primary goal is to improve DM control. [facility] CM Care Plan Problem One  Flowsheet Row Most Recent Value  Care Plan Problem One  Knowledge deficit of diabetes related to dietary modifications  Role Documenting the Problem One  Care Management Coordinator  Care Plan for Problem One  Active  [facility] Long Term Goal (31-90 days)  Patient will be able to create a menu for the week choosing appropriate foods in the next 60 days  THN Long Term Goal Start Date  04/30/16  Interventions for Problem One Long Term Goal  sample menu provided to assist patient with making better meal choices  THN CM Short Term Goal #1 (0-30 days)  patient will identify 2 foods high in protein within the next 3 weeks  THN CM Short Term Goal #1 Start Date  04/30/16  Interventions for Short Term Goal #1  Educate patient about food types:  simple carbs, proteins, fats  THN CM Short Term Goal #2 (0-30 days)  patient will log all consumed foods in a journal over the next 30 days  THN CM Short Term Goal #2 Start Date  04/30/16  Interventions for Short Term Goal #2  Reviewed high protein foods and  provided EMMI education on DM and diets. [facility] DM folder provided to patient    [facility] CM Care Plan Problem Two   Flowsheet Row Most Recent Value  Care Plan Problem Two  active  Role Documenting the Problem Two  Care Management [place] for Problem Two  Active  Interventions for Problem Two Long Term Goal   Reviewed importance of following DM diet. Provided sample diets and education about carb counting.  THN Long Term Goal (31-90) days  Patient will report decrease in a1c in the next 90 days.  THN Long Term Goal Start Date  05/02/16  THN CM Short Term Goal #1 (0-30 days)  Patient will record CBG readings and comments in [facility] calendar for the next 30 days  THN CM Short Term Goal #1 Start Date  05/02/16  Interventions for Short Term Goal #2   [facility] calendar provided. Reviewed signs and symptoms of low blood sugar. Reviewed when to call MD.   [facility] CM Short Term Goal #2 (0-30 days)  Patient will report exercising 10 minutes per day for the next 30 days.  THN CM Short Term Goal #2 Start Date  05/02/16  Interventions for Short Term Goal #2  Reviewed benefits of exercise post stroke and for DM control., Encouraged patient to discuss with physical therapist his need for home exercise plan.      This note sent to MD. Tiajuana Amass letter sent to MD. Tomasa Rand, RN, BSN, [place] Coordinator [phone]

## 2016-05-03 DIAGNOSIS — R531 Weakness: Secondary | ICD-10-CM | POA: Diagnosis not present

## 2016-05-03 DIAGNOSIS — R2681 Unsteadiness on feet: Secondary | ICD-10-CM | POA: Diagnosis not present

## 2016-05-03 DIAGNOSIS — R269 Unspecified abnormalities of gait and mobility: Secondary | ICD-10-CM | POA: Diagnosis not present

## 2016-05-08 ENCOUNTER — Other Ambulatory Visit: Payer: Self-pay | Admitting: Pharmacist

## 2016-05-08 NOTE — Patient Outreach (Signed)
Called patient to discuss medication adherence and medication management for his diabetes and hypertension.  Talked with patient and patients wife about his medication adherence.  He does use a pill box and doesn't usually miss doses of his medication other than he occasionally does miss a dose of his Novolog or Clonidine due to the more frequent dosing schedule.  Patient did state that he had another episode of hypoglycemia on 05/07/16 which is the second episode over the past week (had another episode during [place] home visit).  Patient did not check his blood glucose during the episode but states he had symptoms of weakness, and sweating.  His wife states she treated it appropriately with grape juice then a chicken sandwich.  Patient/caregiver state that they are confused if they should give long acting/short acting insulin following a hypoglycemic episode and which insulin to give.  Following the hypoglycemic episode last night they did skip the Novolog and the Levemir.  05/08/16 Self Monitored Blood Glucose: Before Breakfast 246 mg/dL, Before lunch 251 mg/dL 05/08/16 BP 167/109 mm Hg.   Plan: Schedule Pharmacy home visit for medication management and adherence this week Counseled on proper signs/symptoms and treatment of hypoglycemia Will review current medications and provide patient/caregiver with handouts on hypoglycemia and insulin treatment for diabetes at home visit  Instructed patient to call [facility] Nurse Advice line if he experiences more than 1 hypoglycemic episode before Friday.   [name], PharmD [facility] PGY2 Pharmacy Resident [phone]

## 2016-05-11 ENCOUNTER — Encounter: Payer: Self-pay | Admitting: Pharmacist

## 2016-05-11 ENCOUNTER — Other Ambulatory Visit: Payer: Self-pay | Admitting: Pharmacist

## 2016-05-16 NOTE — Patient Outreach (Signed)
[facility]) Care Management  [place]   05/16/2016  Mark Stewart February 01, 1946 [id number]  Subjective: 70 year old male referred to me for medication management due to uncontrolled type 2 diabetes and hypertension.  Patient has past medical history of CVA (02/26/16), hypertension, diabetes, hyperlipidemia, and hypogonadism.  He was hospitalized in may with a acute ischemic stroke.  During the hospitalization his A1C was increased at 10.7 and he was started on Novolog. His last visit with Dr [name] was on 03/02/16 and he reports he has followup in October.   Patient reports hypoglycemic events. 3 events over the past week.  Patient states likely due to irregular meals and taking his insulin without eating his meals within 5-10 minutes following injection. He also had one event after he took his morning dose of Novolog with breakfast and then took a second dose of Novolog with lunch which was only around 2 hours later.   Patient reported dietary habits: Eats 3 meals/day.  Eats irregular meals and often eats breakfast and lunch within 2 hours of each other. Patient states he sometimes does skip meals. Patient states he tries to follow low sodium and low carbohydrate diet.   Patient reported exercise habits: Starting to walk more.  Wife is trying to get him to walk around the house during the day.   Home fasting CBG: 251, 129, 87, 127 mg/dL Before Lunch CBG: 246, 367, 64, 304 mg/dL  Before Dinner CBG: 256 mg/dL   Home Blood Pressure: 167/109, 183/98, 184/90, 206/98, 242/106, 192/93, 190/102 mmHg Home Heart Rate: 69, 70, 63, 55, 68, 72 beats per min   Objective:  Lab Results  Component Value Date   HGBA1C 10.7 (H) 02/27/2016   01/30/16 Hemoglobin A1C: 11   Vitals:   05/11/16 1205  BP: (!) 190/102  Pulse: 72   BMET    Component Value Date/Time   NA 137 02/27/2016 0536   K 3.7 02/27/2016 0536   CL 103 02/27/2016 0536   CO2 28 02/27/2016 0536   GLUCOSE 251 (H)  02/27/2016 0536   BUN 8 02/27/2016 0536   CREATININE 0.71 02/27/2016 0536   CALCIUM 8.5 (L) 02/27/2016 0536   GFRNONAA >60 02/27/2016 0536   GFRAA >60 02/27/2016 0536    Lipid Panel     Component Value Date/Time   CHOL 121 02/27/2016 0536   TRIG 82 02/27/2016 0536   HDL 34 (L) 02/27/2016 0536   CHOLHDL 3.6 02/27/2016 0536   VLDL 16 02/27/2016 0536   LDLCALC 71 02/27/2016 0536    Encounter Medications: Outpatient Encounter Prescriptions as of 05/11/2016  Medication Sig Note  . Amlodipine-Valsartan-HCTZ 10-160-12.5 MG TABS Take 1 tablet by mouth daily.   Marland Kitchen aspirin EC 325 MG tablet Take 1 tablet (325 mg total) by mouth daily.   Marland Kitchen atorvastatin (LIPITOR) 40 MG tablet Take 40 mg by mouth daily.   . carvedilol (COREG) 25 MG tablet Take 25 mg by mouth 2 (two) times daily.   . cloNIDine (CATAPRES) 0.1 MG tablet Take 0.05 mg by mouth 4 (four) times daily -  with meals and at bedtime.    . fluticasone (FLONASE) 50 MCG/ACT nasal spray Place 2 sprays into both nostrils daily as needed for allergies or rhinitis. Reported on 04/26/2016   . insulin aspart (NOVOLOG) 100 UNIT/ML injection Inject 6 Units into the skin 3 (three) times daily before meals. (Patient taking differently: Inject 10 Units into the skin 3 (three) times daily before meals. )   .  Insulin Detemir (LEVEMIR FLEXTOUCH) 100 UNIT/ML Pen Inject 35 Units into the skin daily. (Patient taking differently: Inject 15 Units into the skin daily. )   . metFORMIN (GLUCOPHAGE) 1000 MG tablet Take 1,000 mg by mouth 2 (two) times daily with a meal.   . [DISCONTINUED] Amlodipine-Valsartan-HCTZ 10-160-12.5 MG TABS Take by mouth. 04/26/2016: Received from: [facility]  . [DISCONTINUED] atorvastatin (LIPITOR) 20 MG tablet Take 1 tablet (20 mg total) by mouth daily. (Patient not taking: Reported on 05/11/2016)    No facility-administered encounter medications on file as of 05/11/2016.     Functional Status: In your present state of  health, do you have any difficulty performing the following activities: 05/02/2016 04/30/2016  Hearing? [name]  Vision? Y Y  Difficulty concentrating or making decisions? [name]  Walking or climbing stairs? N N  Dressing or bathing? N N  Doing errands, shopping? N N  Preparing Food and eating ? N N  Using the Toilet? N N  In the past six months, have you accidently leaked urine? N N  Do you have problems with loss of bowel control? N N  Managing your Medications? N N  Managing your Finances? N N  Housekeeping or managing your Housekeeping? N N  Some recent data might be hidden    Fall/Depression Screening: PHQ 2/9 Scores 05/02/2016 04/30/2016  PHQ - 2 Score 0 1    Assessment: Drugs sorted by system: Cardiovascular: Amlodipine-valsartan-hydrochlorothiazide 10-160-12.5 mg daily, aspirin 325 mg daily, atorvastatin 40 mg daily, carvedilol 25 mg daily, clonidine 0.05 mg four times daily  Pulmonary/Allergy: Fluticasone, Normal saline nasal spray  Endocrine: metformin 1000 mg twice daily, Levemir 15 units daily, Novolog 10 units three times daily before meals  Diabetes: A1C currently above goal of <7% on metformin, Levemir, and Novolog.  Patient has had increased hypoglycemia since starting Novolog likely due to irregular meals, taking multiple doses of Novolog within a 2-3 hours time period with breakfast and lunch, and skipping meals.   Hypertension: Home blood pressure readings remain above goal of <140/90 on amlodipine/valsartan/hydrochlorothizide and clonidine.  Valsartan and hydrochlorothiazide are not at max doses.   Plan: -Counseled patient on signs/symptoms and treatment of hypoglycemia using the rule of 15s -Counseled on low carbohydrate and low sodium diet, portion sizes, and maintaining adequate fluid intake with water. Recommended he try flavored sugar free water packets.   -Counseled on proper insulin administration including only taking Novolog if he is sure will be eating a full  meal within 5-15 minutes.  Instructed patient to not take second dose of Novolog if he will be eating 2 meals within a 3 hour time period due to the risk of hypoglycemia.   -Discussed the differences between Levemir and Novolog -Discussed other medications for diabetes including GLP-1 agonists such as Victoza and SGLT-2 inhibitors including mechanism of action, side effects, efficacy, and cardiovascular data.  Patient was open to trying SGLT-2 inhibitor due to its positive effects on blood pressure, A1C, and cardiovascular risk reduction. He was hesitant of trying a GLP-1 agonist due to weight effects and satiety.  -Will followup with Dr [name] about considering increasing Valsartan to help with patients consistently elevated blood pressure. -Will followup with Dr [name] about considering SGLT-2 inhibitor due to lower risk of hypoglycemia and positive A1C and cardiovascular risk reduction.  Followup pharmacy visit to patients home in 3 weeks.  [name], PharmD [facility] PGY2 Pharmacy Resident [phone]

## 2016-05-17 ENCOUNTER — Ambulatory Visit: Payer: Self-pay

## 2016-05-17 ENCOUNTER — Other Ambulatory Visit: Payer: Self-pay

## 2016-05-17 NOTE — Patient Outreach (Signed)
Follow up telephone call:  Placed call to patient who reports that he is doing well. States that his blood sugars are running around 100 daily fasting. Reports another hypoglycemic episodes in the last 2 weeks.  None in the last week.  Reports evening readings 160-170.   Encouraged patient to eat a snack daily prior to bedtime.   Patient denies any new concerns at this time.   PLAN:  Reviewed with patient schedule home visit for August 24.  Encouraged patient to call MD for hypoglycemic episodes.   Tomasa Rand, RN, BSN, CEN [facility] [employer] [phone]

## 2016-05-19 NOTE — ED Provider Notes (Signed)
ROS:  + elevated blood sugar + weakness All other sx reviewed and are negative  Physical Exam:  Gen:  Awake and alert HEENT:  Perrla, eomi, nasopharynx clear Neck:  No lad, supple CV:  Rrr, no mrg Lungs:  Clear to auscultation bilaterally Abd:  Soft, nontender Neuro:  Awake and alert, moving all 4 extr, tremulous Psych:  normal   Isla Pence, MD 05/19/16 [phone]

## 2016-05-28 ENCOUNTER — Other Ambulatory Visit: Payer: Self-pay | Admitting: *Deleted

## 2016-05-28 NOTE — Patient Outreach (Signed)
[place] [facility]) Care Management  05/28/2016  Mark Stewart Sep 11, 1946 [id number]   Follow up telephone call  Placed call to [name] reports he is doing okay. Patient states his blood sugars are running around 100 in the am, and 150's after that.  Patient denies any new concerns at this time. Discussed with patient need to reschedule home visit for 8/24 , with assigned care manager Tomasa Rand, patient prefers to wait for follow up call from Care manager regarding rescheduling of home visit.  Plan Tomasa Rand, Community care Coordinator will follow up with patient within next week,  regarding visit for August.    Joylene Draft, RN, [place] Management [phone]- Mobile [phone]- [place] Office

## 2016-05-31 ENCOUNTER — Ambulatory Visit: Payer: Self-pay

## 2016-06-04 ENCOUNTER — Other Ambulatory Visit: Payer: Self-pay

## 2016-06-04 NOTE — Patient Outreach (Signed)
Care Coordination: Placed call to patient to reschedule missed visit from last week.  Offered home visit on 06/07/2016 and patient has accepted.  PLAN: Will see patient on 06/07/2016 for home visit.  Tomasa Rand, RN, BSN, CEN [facility] [employer] [phone]

## 2016-06-06 ENCOUNTER — Other Ambulatory Visit: Payer: Self-pay | Admitting: Pharmacist

## 2016-06-06 ENCOUNTER — Encounter: Payer: Self-pay | Admitting: Pharmacist

## 2016-06-06 NOTE — Patient Outreach (Signed)
[facility]) Care Management  [place]   06/07/2016  Mark Stewart 03-25-1946 [id number]  Subjective: 70 year old male referred to me for medication management due to uncontrolled type 2 diabetes and hypertension.  Patient has past medical history of CVA (02/26/16), hypertension, diabetes, hyperlipidemia, and hypogonadism.  He was hospitalized in may with a acute ischemic stroke.  During the hospitalization his A1C was increased at 10.7 and he was started on Novolog. His last visit with Dr Mark Stewart was on 03/02/16 and he reports he has followup in October.   He reports taking multiple doses of Novolog with breakfast and lunch that are ~ 2 hours apart.  He also takes Novolog after his lunch around 10 minutes after he walks after the restaurant.  Reports one episode of hypoglycemia with one low of 67 of which he treated by eating breakfast.  According to his blood glucose log he has had two lows since my last visit on 05/11/16.  Reports nocturia 2-3 times per day which has improved Denies vision changes Denies neuropathy Reports checking feet daily and reports no changes.   Does miss clonidine occasionally and states adherence to his medications.  Denies falls but does report some issues with balance occasionally.  Denies shortness of breath States that he does have some tiredness. Sleeping a lot throughout the day but has trouble sleeping at night.  Does have some frustration.  Forgets hearing aids and doing some of his normal activities. He has work he needs to do and doesn't seem to be able to get himself in a mood to do it. He doesn't have to do the work but he wants to do it and has always been able to do it and this has frustrated him. A lot of stress is coming from financial related stress.   Exercise: Reports mall walking 2-3 days for 30 minutes to 1 hour.  Does walk 5-6 laps.    Diet: States he has increased water by using snapple packets in water. States is trying  to follow low sodium and low carbohydrate diet.    Objective:  Lab Results  Component Value Date   HGBA1C 10.7 (H) 02/27/2016   Vitals:   06/06/16 1103  BP: (!) 197/102  Pulse: 67    Lipid Panel     Component Value Date/Time   CHOL 121 02/27/2016 0536   TRIG 82 02/27/2016 0536   HDL 34 (L) 02/27/2016 0536   CHOLHDL 3.6 02/27/2016 0536   VLDL 16 02/27/2016 0536   LDLCALC 71 02/27/2016 0536    Home fasting CBG before breakfast (last week): 130, 110, 173, 131, 146, 115, 152 Home blood pressure: 168/88, 187/87, 192/92, 187/92, 161/85, 189/99, 190/109, 163/112, 178/93, 187/90, 182/101, 175/84, 196/97, 190/104 mmHg Home heart rate: 67, 66, 71, 80, 71, 75, 65, 74, 76, 64, 67, 64, 72, 68   Encounter Medications: Outpatient Encounter Prescriptions as of 06/06/2016  Medication Sig  . Amlodipine-Valsartan-HCTZ 10-160-12.5 MG TABS Take 1 tablet by mouth daily.  Marland Kitchen aspirin EC 325 MG tablet Take 1 tablet (325 mg total) by mouth daily.  Marland Kitchen atorvastatin (LIPITOR) 40 MG tablet Take 40 mg by mouth daily.  . carvedilol (COREG) 25 MG tablet Take 25 mg by mouth 2 (two) times daily.  . cloNIDine (CATAPRES) 0.1 MG tablet Take 0.05 mg by mouth 4 (four) times daily -  with meals and at bedtime.   . fluticasone (FLONASE) 50 MCG/ACT nasal spray Place 2 sprays into both nostrils daily  as needed for allergies or rhinitis. Reported on 04/26/2016  . insulin aspart (NOVOLOG) 100 UNIT/ML injection Inject 6 Units into the skin 3 (three) times daily before meals. (Patient taking differently: Inject 10 Units into the skin 3 (three) times daily before meals. )  . Insulin Detemir (LEVEMIR FLEXTOUCH) 100 UNIT/ML Pen Inject 35 Units into the skin daily. (Patient taking differently: Inject 15 Units into the skin daily. )  . metFORMIN (GLUCOPHAGE) 1000 MG tablet Take 1,000 mg by mouth 2 (two) times daily with a meal.   No facility-administered encounter medications on file as of 06/06/2016.     Functional Status: In  your present state of health, do you have any difficulty performing the following activities: 06/06/2016 05/02/2016  Hearing? N Y  Vision? N Y  Difficulty concentrating or making decisions? N Y  Walking or climbing stairs? N N  Dressing or bathing? N N  Doing errands, shopping? N N  Preparing Food and eating ? - N  Using the Toilet? - N  In the past six months, have you accidently leaked urine? - N  Do you have problems with loss of bowel control? - N  Managing your Medications? - N  Managing your Finances? - N  Housekeeping or managing your Housekeeping? - N  Some recent data might be hidden    Fall/Depression Screening: PHQ 2/9 Scores 06/06/2016 06/06/2016 05/02/2016 04/30/2016  PHQ - 2 Score 4 0 0 1  PHQ- 9 Score 13 - - -    Assessment: Drugs sorted by system: Cardiovascular: Amlodipine-valsartan-hydrochlorothiazide 10-160-12.5 mg daily, aspirin 325 mg daily, atorvastatin 40 mg daily, carvedilol 25 mg twice daily, clonidine 0.05 mg four times daily  Endocrine: metformin 1000 mg twice daily, Levemir 15 units daily, Novolog 10 units three times daily before meals  Diabetes: A1C currently above goal of <7% on metformin, Levemir, and Novolog with improved home blood glucose readings since last visit. Hypoglycemic episodes have improved but patient is still taking Novolog after meals and taking 2 doses of Novolog with breakfast and lunch within 2 hours increasing his risk for hypoglycemic episodes.   Hypertension: BP consistently elevated above goal of <140/90 with consistently elevated home systolic blood pressures in [id number], diastolic blood pressures in 80-100s, and heart rates in the 60-70s.   Plan: -Called Dr Mark Stewart office to notify them of patients continually elevated blood pressures and his nurse states that she will notify him of my call -Counseled patient on signs/symptoms/treatment of hypoglycemia. Instructed him to take insulin before meals not after meals and to not take  bolus novolog within 3 hours of giving the prior dose if his meals are within that time period -Will contact Dr Mark Stewart office for Novolog Pen prescription as this may assist patient with taking his insulin before meals and increase proper administration -Will recommend to Dr Mark Stewart to increase Valsartan to 320 mg daily and continue other blood pressure medications with a basic metabolic panel to follow up renal function/electrolytes 1 week after initiation -Referral has been placed by [place] RN to Education officer, museum to assist with depression  [name], PharmD [facility] PGY2 Pharmacy Resident [phone]  [facility] CM Care Plan Problem One   Flowsheet Row Most Recent Value  Care Plan Problem One  Knowledge deficit of diabetes management related to insulin administration  Role Documenting the Problem One  Clinical Pharmacist  Care Plan for Problem One  Active  THN CM Short Term Goal #1 (0-30 days)  Patient will administer his Novolog insulin before  meals and not within 3 hours of the previous Novolog administration over the next 30 days as measured by patient report  THN CM Short Term Goal #1 Start Date  06/06/16  Interventions for Short Term Goal #1  Counseled patient on signs/symptoms/treatment of hypoglycemia. Instructed him to take insulin before meals not after meals and to not take bolus novolog within 3 hours of giving the prior dose if his meals are within that time period

## 2016-06-07 ENCOUNTER — Other Ambulatory Visit: Payer: Self-pay

## 2016-06-07 NOTE — Patient Outreach (Signed)
[place] [facility]) Care Management   06/07/2016  Mark Stewart 10-10-1945 [id number]  Mark Stewart is an 70 y.o. male  Subjective: Patient reports that he does not have any energy. Reports being sleepy all day. States all he wants to do is sleep.  Reports that he is eating better. Eats that he eats all throughout the day. Feels lack of motivation.  Feels depressed.. Reports that he has had 2 episodes of low blood sugar but was able to eat and gets his blood sugar back up. Reports these occurred once in the morning and once after eating meals too close together and taking insulin with each meal.  Reports no episodes like the last home visit. Patient is monitoring CBG 2-3 times per day. Fasting range of 67-251.  Mostly 130-180.  Todays reading of 169.  Patient is also monitoring daily blood pressures and readings in the last 30 days are all elevated.  160-200/80-110.   Objective:  Vitals:   06/07/16 0957  BP: 128/62  Pulse: 77  Resp: 18  SpO2: 97%    Review of Systems  Constitutional: Positive for malaise/fatigue.  HENT: Negative.   Eyes: Negative.   Respiratory: Negative.   Cardiovascular: Positive for leg swelling.       Right leg swelling  Gastrointestinal: Negative.   Genitourinary: Negative.   Musculoskeletal: Negative.        Reports stumbles but no falls.  Skin: Negative.   Neurological: Negative.   Endo/Heme/Allergies: Negative.   Psychiatric/Behavioral: Positive for depression.    Physical Exam  Constitutional: He is oriented to person, place, and time. He appears well-developed and well-nourished.  Cardiovascular: Normal rate.   Respiratory: Effort normal and breath sounds normal.  GI: Soft. Bowel sounds are normal.  Musculoskeletal: Normal range of motion. He exhibits edema.  1 plus to the right leg.  Neurological: He is alert and oriented to person, place, and time.  Appears sluggish  Skin: Skin is warm and dry.  No broken skin to feet.   Psychiatric: He has a normal mood and affect. His behavior is normal. Judgment and thought content normal.  Mood appears sluggish, easily engaged in conversation, affect appears somewhat flat.    Encounter Medications:   Outpatient Encounter Prescriptions as of 06/07/2016  Medication Sig  . Amlodipine-Valsartan-HCTZ 10-160-12.5 MG TABS Take 1 tablet by mouth daily.  Marland Kitchen aspirin EC 325 MG tablet Take 1 tablet (325 mg total) by mouth daily.  Marland Kitchen atorvastatin (LIPITOR) 40 MG tablet Take 40 mg by mouth daily.  . carvedilol (COREG) 25 MG tablet Take 25 mg by mouth 2 (two) times daily.  . cloNIDine (CATAPRES) 0.1 MG tablet Take 0.05 mg by mouth 4 (four) times daily -  with meals and at bedtime.   . fluticasone (FLONASE) 50 MCG/ACT nasal spray Place 2 sprays into both nostrils daily as needed for allergies or rhinitis. Reported on 04/26/2016  . insulin aspart (NOVOLOG) 100 UNIT/ML injection Inject 6 Units into the skin 3 (three) times daily before meals. (Patient taking differently: Inject 10 Units into the skin 3 (three) times daily before meals. )  . Insulin Detemir (LEVEMIR FLEXTOUCH) 100 UNIT/ML Pen Inject 35 Units into the skin daily. (Patient taking differently: Inject 15 Units into the skin daily. )  . metFORMIN (GLUCOPHAGE) 1000 MG tablet Take 1,000 mg by mouth 2 (two) times daily with a meal.   No facility-administered encounter medications on file as of 06/07/2016.     Functional Status:   In  your present state of health, do you have any difficulty performing the following activities: 06/06/2016 05/02/2016  Hearing? N Y  Vision? N Y  Difficulty concentrating or making decisions? N Y  Walking or climbing stairs? N N  Dressing or bathing? N N  Doing errands, shopping? N N  Preparing Food and eating ? - N  Using the Toilet? - N  In the past six months, have you accidently leaked urine? - N  Do you have problems with loss of bowel control? - N  Managing your Medications? - N  Managing your  Finances? - N  Housekeeping or managing your Housekeeping? - N  Some recent data might be hidden    Fall/Depression Screening:    PHQ 2/9 Scores 06/06/2016 06/06/2016 05/02/2016 04/30/2016  PHQ - 2 Score 4 0 0 1  PHQ- 9 Score 13 - - -    Assessment:   (1) Blood pressure per log elevated. Patient has not taken log to MD. Reports occasionally missing some doses of clonidine. (2) Cbg readings are improving.  Patient with a hearty appetite. Ate 2 sandwiches at bedtime last night. (3) appears depressed. Willing to work with [facility] social worker, willing to talk with MD about depression and is willing to take medication if recommended. Reports being sleepy and lacks motivation. (4)lack of exercise.   Plan:  (1) Encouraged patient to follow low salt diet. Take medications as prescribed. Follow up with primary MD. Encouraged patient to take blood pressure logs to MD and call MD daily with elevated blood pressure readings.  (2)Encouraged patient to continue to eat protein and reduce carbs. Reviewed white bread vs wheat bread when eating 2 sandwiches. (3) placed order for [facility] social worker to assist with depression. (4) Patient has agreed to start daily exercise program.  Will update [facility] pharmacist. Will placed call to MD to arrange a follow up appointment to discuss blood pressure readings, A1c. (Last labs done on 02/27/2016) and to discuss depression and lack of energy and motivation.   Next home visit planned for September 27th. This date and time recorded in patients calendar. Will follow up with patient via phone about MD appointment.    [facility] CM Care Plan Problem One   Flowsheet Row Most Recent Value  Care Plan Problem One  Knowledge deficit of diabetes related to dietary modifications  Role Documenting the Problem One  Care Management Coordinator  Care Plan for Problem One  Active  THN Long Term Goal (31-90 days)  Patient will be able to create a menu for the week choosing appropriate foods in  the next 60 days  THN Long Term Goal Start Date  04/30/16  Interventions for Problem One Long Term Goal  Reviewed importance of daily meal planning.  THN CM Short Term Goal #1 (0-30 days)  patient will identify 2 foods high in protein within the next 3 weeks  THN CM Short Term Goal #1 Start Date  04/30/16  [facility] CM Short Term Goal #1 Met Date  06/07/16  Interventions for Short Term Goal #1  Educate patient about food types:  simple carbs, proteins, fats  THN CM Short Term Goal #2 (0-30 days)  patient will log all consumed foods in a journal over the next 30 days  THN CM Short Term Goal #2 Start Date  04/30/16  [facility] CM Short Term Goal #2 Met Date  06/07/16  Interventions for Short Term Goal #2  Reviewed high protein foods and provided EMMI education on DM and  diets. THN DM folder provided to patient    [facility] CM Care Plan Problem Two   Flowsheet Row Most Recent Value  Care Plan Problem Two  active  Role Documenting the Problem Two  Care Management [place] for Problem Two  Active  Interventions for Problem Two Long Term Goal   will place call to MD office to make a follow up appointment for labs and office visit.  THN Long Term Goal (31-90) days  Patient will report decrease in a1c in the next 90 days.  THN Long Term Goal Start Date  05/02/16  THN CM Short Term Goal #1 (0-30 days)  Patient will record CBG readings and comments in [facility] calendar for the next 30 days  THN CM Short Term Goal #1 Start Date  05/02/16  [facility] CM Short Term Goal #1 Met Date   06/07/16  Interventions for Short Term Goal #2   Reviewed importance of eating a daily bedtime snack.  THN CM Short Term Goal #2 (0-30 days)  Patient will report exercising 10 minutes per day for the next 30 days.  THN CM Short Term Goal #2 Start Date  06/07/16  Interventions for Short Term Goal #2  Reviewed benefits of exercise post stroke and for DM control., Encouraged patient to discuss with physical therapist his need for home exercise  plan.      Tomasa Rand, RN, BSN, CEN [facility] [employer] [phone]

## 2016-06-08 ENCOUNTER — Other Ambulatory Visit: Payer: Self-pay

## 2016-06-08 NOTE — Patient Outreach (Signed)
Care Coordination: Placed call to MD office and confirmed with staff that patient has a follow up appointment for labs, MD visit to discuss blood pressure, blood sugar and depression.  Placed call to patient to confirm appointment for 06/20/2016. Patient is planning to go to this appointment and is aware he needs to be fasting.  Also patient wanted me to know that he is out for a walk as we discussed yesterday. Encouraged patient to continue exercise.  PLAN: Will follow up as planned with patient in 4 weeks. Will update [facility] pharmacist.  Tomasa Rand, RN, BSN, CEN [place] Coordinator [phone]

## 2016-06-12 ENCOUNTER — Other Ambulatory Visit: Payer: Self-pay | Admitting: *Deleted

## 2016-06-12 NOTE — Patient Outreach (Signed)
[place] [facility]) Care Management  06/12/2016  Mark Stewart 01-03-1946 [id number]   CSW was able to make initial contact with patient today to introduce self and role. Patient agrees to visit and have planned for initial visit next Monday, 06/18/16.   [name], MSW, [place] Worker  [place] [phone]

## 2016-06-13 IMAGING — CT CT ANGIO HEAD
1 of 13 series · 1 of 33 positions shown · IV contrast (isovue)
Comparison: None.

CLINICAL DATA: Dizziness for 2 weeks.

EXAM:
CT ANGIOGRAPHY HEAD AND NECK
TECHNIQUE: Multidetector CT imaging of the head and neck was performed using
the standard protocol during bolus administration of intravenous
contrast. Multiplanar CT image reconstructions and MIPs were
obtained to evaluate the vascular anatomy. Carotid stenosis
measurements (when applicable) are obtained utilizing NASCET
criteria, using the distal internal carotid diameter as the
denominator.
CONTRAST:  50 cc Isovue 370 intravenous

[Series 300: locator · axial · 0.49mm/px · 1 of 1 slices shown]
[im 1/1  soft-tissue]
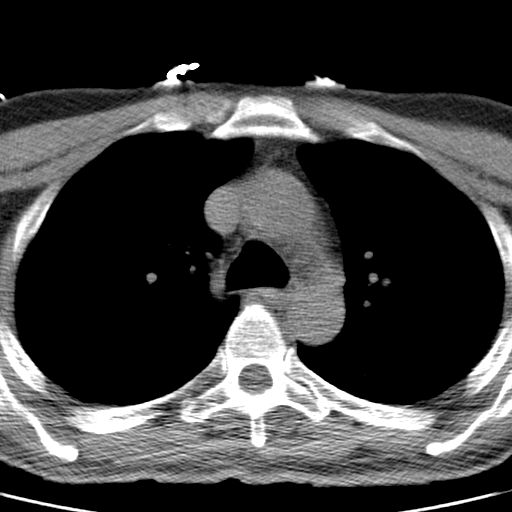

[1 of 33 positions shown; findings below may reference images not displayed]

FINDINGS: CT HEAD

Brain: Known perforator infarct affecting the right thalamus. No
superimposed hemorrhage. No notable ischemic changes elsewhere. No
hydrocephalus.

Calvarium and skull base: Negative

Paranasal sinuses: Clear

Orbits: Bilateral optic drusen.  Bilateral cataract resection.

CTA NECK

Aortic arch: No aneurysm or dissection.  Three vessel branching

Right carotid system: Predominately calcified plaque at the
bifurcation without stenosis greater than 50%. No dissection or
plaque ulceration noted.

Left carotid system: Predominant noncalcified plaque along the mid
common carotid and bifurcation without flow limiting stenosis or
dissection. No plaque ulceration.

Vertebral arteries:Left dominant. Moderate atheromatous narrowing at
the left ostia (503: 145). There is prominent plaque on the proximal
left subclavian artery without stenosis greater than 50%. Positive
remodeling at the subclavian atherosclerosis. Smooth and patent
right vertebral artery.

Skeleton: No contributory finding.

Other neck: No incidental mass in the neck. Symmetric prominence of
lingual tonsils.

CTA HEAD

Anterior circulation: Intact circle-of-Willis with large
communicating arteries. No major branch occlusion or flow limiting
stenosis. Negative for aneurysm.

Posterior circulation: Left dominant. Fetal type PCA bilaterally.
Right P4 branch stenosis noted 503: [REDACTED]. Otherwise widely
patent. No regularity along the right posterior communicating artery
or P1 segment to correlate with acute infarct.

Venous sinuses: Patent

Anatomic variants: Intact circle-of-Willis.  Fetal type PCA.

Delayed phase: No parenchymal enhancement or mass.
IMPRESSION: 1. No acute arterial finding.
2. Cervical carotid atherosclerosis without flow limiting stenosis.
3. Moderate atheromatous narrowing of the dominant left vertebral
artery ostium.
4. Focal high-grade right P4 branch stenosis.

## 2016-06-18 ENCOUNTER — Other Ambulatory Visit: Payer: Self-pay | Admitting: *Deleted

## 2016-06-18 ENCOUNTER — Encounter: Payer: Self-pay | Admitting: *Deleted

## 2016-06-18 NOTE — Patient Outreach (Signed)
[name]) Care Management  [facility] Social Work  06/18/2016  Mark Stewart May 29, 1946 [id number]  Subjective:  Patient with depression signs/symptoms as well as verbalized feelings of depression since his CVA.    Encounter Medications:  Outpatient Encounter Prescriptions as of 06/18/2016  Medication Sig  . Amlodipine-Valsartan-HCTZ 10-160-12.5 MG TABS Take 1 tablet by mouth daily.  Marland Kitchen aspirin EC 325 MG tablet Take 1 tablet (325 mg total) by mouth daily.  Marland Kitchen atorvastatin (LIPITOR) 40 MG tablet Take 40 mg by mouth daily.  . carvedilol (COREG) 25 MG tablet Take 25 mg by mouth 2 (two) times daily.  . cloNIDine (CATAPRES) 0.1 MG tablet Take 0.05 mg by mouth 4 (four) times daily -  with meals and at bedtime.   . fluticasone (FLONASE) 50 MCG/ACT nasal spray Place 2 sprays into both nostrils daily as needed for allergies or rhinitis. Reported on 04/26/2016  . insulin aspart (NOVOLOG) 100 UNIT/ML injection Inject 6 Units into the skin 3 (three) times daily before meals. (Patient taking differently: Inject 10 Units into the skin 3 (three) times daily before meals. )  . Insulin Detemir (LEVEMIR FLEXTOUCH) 100 UNIT/ML Pen Inject 35 Units into the skin daily. (Patient taking differently: Inject 15 Units into the skin daily. )  . metFORMIN (GLUCOPHAGE) 1000 MG tablet Take 1,000 mg by mouth 2 (two) times daily with a meal.   No facility-administered encounter medications on file as of 06/18/2016.     Functional Status:  In your present state of health, do you have any difficulty performing the following activities: 06/06/2016 05/02/2016  Hearing? N Y  Vision? N Y  Difficulty concentrating or making decisions? N Y  Walking or climbing stairs? N N  Dressing or bathing? N N  Doing errands, shopping? N N  Preparing Food and eating ? - N  Using the Toilet? - N  In the past six months, have you accidently leaked urine? - N  Do you have problems with loss of bowel control? - N  Managing  your Medications? - N  Managing your Finances? - N  Housekeeping or managing your Housekeeping? - N  Some recent data might be hidden    Fall/Depression Screening:  PHQ 2/9 Scores 06/18/2016 06/06/2016 06/06/2016 05/02/2016 04/30/2016  PHQ - 2 Score 6 4 0 0 1  PHQ- 9 Score 14 13 - - -    Assessment:  CSW met with patient in his home today. Patient shared with CSW that he had a stroke and since that time has felt depressed.  "My get up and go has got up and gone". He reports signs/symptoms consistent with depression and scored 14 on the PHQ-9 Depression screening tool. CSW discussed with patient treatment options/recommnedations for possible medication to help treat symptoms.   He reports his wife takes an anti-depressant and it has helped her.  He wants to become more motivated, active and involved but admits to being "lazy" and "choosing to sleep in my chair a lot".  He is agreeable to plans to discuss this with his PCP at his scheduled visit this Wednesday.   Plan: CSW will communicate this visit with his PCP and plan follow up call to patient next week for update on PCP plans.    [facility] CM Care Plan Problem One   Flowsheet Row Most Recent Value  Care Plan Problem One  Patient voices signs/symptoms of depression and scored mild-moderate on screening tool for depression.  Role Documenting the Problem One  Clinical Social  Worker  Care Plan for Problem One  Active  THN Long Term Goal (31-90 days)  Patient will discuss his depression with PCP and consider RX treatment within the next 90 days.   THN Long Term Goal Start Date  06/18/16  Interventions for Problem One Long Term Goal  CSW completed screening for depression and treatment options.   THN CM Short Term Goal #1 (0-30 days)  Patient will communicate with PCP concerns for depression within the next 30 days.   THN CM Short Term Goal #1 Start Date  06/18/16  Interventions for Short Term Goal #1  CSW discussed signs/symptons and reasons for  depression and treatment options including RX.    THN CM Short Term Goal #2 (0-30 days)  Patient will recive and take an anti-depressant to help treat his depression within the next 30days.   THN CM Short Term Goal #2 Start Date  06/18/16  Interventions for Short Term Goal #2  CSW will communicate with PCP and encouraged patient to discsuss at PCP visit this week. [CSW encouarged patient to discuss with his PCP at visit. ]       [name], MSW, [place] Worker  [place] [phone]

## 2016-06-20 DIAGNOSIS — E784 Other hyperlipidemia: Secondary | ICD-10-CM | POA: Diagnosis not present

## 2016-06-20 DIAGNOSIS — Z794 Long term (current) use of insulin: Secondary | ICD-10-CM | POA: Diagnosis not present

## 2016-06-20 DIAGNOSIS — Z8673 Personal history of transient ischemic attack (TIA), and cerebral infarction without residual deficits: Secondary | ICD-10-CM | POA: Diagnosis not present

## 2016-06-20 DIAGNOSIS — I10 Essential (primary) hypertension: Secondary | ICD-10-CM | POA: Diagnosis not present

## 2016-06-20 DIAGNOSIS — E118 Type 2 diabetes mellitus with unspecified complications: Secondary | ICD-10-CM | POA: Diagnosis not present

## 2016-07-04 ENCOUNTER — Other Ambulatory Visit: Payer: Self-pay

## 2016-07-04 ENCOUNTER — Other Ambulatory Visit: Payer: Self-pay | Admitting: Pharmacist

## 2016-07-04 NOTE — Patient Outreach (Signed)
Care Coordination: Received a call back from MD office. Spoke with Hildred Alamin. Reviewed recent BP readings in the last week and todays readings.   Hildred Alamin wanted another reading from today to determine if patient needs to go to ED or be seen in office tomorrow by Dr. [name].  Hildred Alamin has agreed to call patient and get him to check his bp.  Reviewed with Hildred Alamin that I had called [facility] Hypertension clinic and requested an earlier appointment due to increased BP. Informed Hildred Alamin that I am waiting for a call back from Hypertension clinic.  Plan: will continue to follow up with patient.   Tomasa Rand, RN, BSN, CEN [facility] [employer] [phone]

## 2016-07-04 NOTE — Patient Outreach (Addendum)
[facility]) Care Management  [place]   07/04/2016  ERYX ZANE [birth year] [id number]  Subjective: 70 year old male referred to me for medication management due to uncontrolled type 2 diabetes and hypertension.  Patient has past medical history of CVA (02/26/16), hypertension, diabetes, hyperlipidemia, and hypogonadism.  He was hospitalized in may with a acute ischemic stroke. Has been checking blood glucose 1-2 times per day and checking blood pressure and heart rate 1-2 times per day.  Denies hypoglycemia. Patient reports taking his Novolog before meals and that the Novolog pen has been better for him to have proper adherence to his insulin. Patient states Dr [name] believes clonidine may be causing some of the depressive symptoms but he wants to continue it until patient sees a blood pressure specialist. Patient states he has blood pressure specialist followup on 07/24/16.  Patient states that he continues to have drowsiness and sleeping throughout the day. He reports adherence to his medications and states that he has had his blood pressure medications this morning. Patient denies headache, dizziness, or blurred vision.   Diet: Patient reports adherence to a low sodium diet. He reports that he eats a lot of breads but he tried wheat bread but he did not like it.   Exercise:  Patient has started walking for 20-30 minutes a 2-3 times a week. He states that walking has may have helped his spirits/depressive symptoms but only a little bit.   CBG today 289 mg/dL. Patient states Dr [name] wants him to send a blood glucose log to his office on 10/13.  Patient states he changed to Novolog 5-10-5 units with breakfast, lunch, and dinner on 06/08/16.   He denies falls but states he has occasional problems with balance. Reports nocturia 2 times per night.   Denies vision changes. Denies signs/symptoms of neuropathy.  Reports foot exams daily and reports no changes.   He reports a  dry cough for around 8 months which is worse at night when he lies to one side.   Objective:  BP 200//90 mm Hg Home Blood Pressure Readings: 168/92, 171/89, 185/94, 182/100, 184/89, 177/89, 183/83 mm Hg Home Heart Rate: 79, 72, 72, 75, 69, 69, 67, 63 beats/min Home Blood Glucose Before breakfast: 289, 258, 160, 166, 158, 370, 256, 253, 232 Before lunch: 182, 117, 229, 151, 336, 335 Before supper: 300, 306  06/20/16 A1C 8.5%  Lipid Profile (06/20/2016 9:22 AM) Lipid Profile (06/20/2016 9:22 AM)  Component Value Ref Range  LDL Direct 81 <150 mg/dL  Total Cholesterol 131 25 - 199 MG/DL  Triglycerides 67 10 - 150 MG/DL  HDL Cholesterol 39 35 - 135 MG/DL  Total Chol / HDL Cholesterol 3.4 <4.5  Non-HDL Cholesterol 92 MG/DL   Encounter Medications: Outpatient Encounter Prescriptions as of 07/04/2016  Medication Sig  . Amlodipine-Valsartan-HCTZ 10-160-12.5 MG TABS Take 1 tablet by mouth daily.  Marland Kitchen aspirin EC 325 MG tablet Take 1 tablet (325 mg total) by mouth daily.  Marland Kitchen atorvastatin (LIPITOR) 80 MG tablet Take 80 mg by mouth daily.  . carvedilol (COREG) 25 MG tablet Take 25 mg by mouth 2 (two) times daily.  . cloNIDine (CATAPRES) 0.1 MG tablet Take 0.05 mg by mouth 4 (four) times daily -  with meals and at bedtime.   . fluticasone (FLONASE) 50 MCG/ACT nasal spray Place 2 sprays into both nostrils daily as needed for allergies or rhinitis. Reported on 04/26/2016  . insulin aspart (NOVOLOG) 100 UNIT/ML FlexPen Inject 5-10 Units into  the skin 3 (three) times daily with meals. 5 units before breakfast, 10 units before lunch, 5 units before supper  . Insulin Detemir (LEVEMIR FLEXTOUCH) 100 UNIT/ML Pen Inject 35 Units into the skin daily. (Patient taking differently: Inject 15 Units into the skin daily. )  . metFORMIN (GLUCOPHAGE) 1000 MG tablet Take 1,000 mg by mouth 2 (two) times daily with a meal.  . [DISCONTINUED] atorvastatin (LIPITOR) 40 MG tablet Take 40 mg by mouth daily.  . [DISCONTINUED]  insulin aspart (NOVOLOG) 100 UNIT/ML injection Inject 6 Units into the skin 3 (three) times daily before meals. (Patient taking differently: Inject 5-10 Units into the skin 3 (three) times daily before meals. Take 5 units before breakfast, 10 units before lunch, and 5 units before supper)   No facility-administered encounter medications on file as of 07/04/2016.     Functional Status: In your present state of health, do you have any difficulty performing the following activities: 06/06/2016 05/02/2016  Hearing? N Y  Vision? N Y  Difficulty concentrating or making decisions? N Y  Walking or climbing stairs? N N  Dressing or bathing? N N  Doing errands, shopping? N N  Preparing Food and eating ? - N  Using the Toilet? - N  In the past six months, have you accidently leaked urine? - N  Do you have problems with loss of bowel control? - N  Managing your Medications? - N  Managing your Finances? - N  Housekeeping or managing your Housekeeping? - N  Some recent data might be hidden    Fall/Depression Screening: PHQ 2/9 Scores 06/18/2016 06/06/2016 06/06/2016 05/02/2016 04/30/2016  PHQ - 2 Score 6 4 0 0 1  PHQ- 9 Score 14 13 - - -    Assessment: Diabetes: A1C currently above goal of <7% but improved from 10.7% to 8.5% on metformin, Levemir, and Novolog. His Novolog was decreased by Dr [name] due to hypoglycemic episodes to Novolog 5-10-5 units with breakfast, lunch, and dinner respectively.     Hypertension: Remains above goal of <140/90 on amlodipine/valsartan/hydrochlorothizide and clonidine. Valsartan and hydrochlorothiazide are not at max doses. [facility] pharmacy has previously recommended a dose increase of Valsartan to Dr [name] but no changes were made.  Dr [name] has now referred Mr Kahrs to a hypertension specialist at [facility]. Consistently elevated blood pressures place patient at increased risk of recurrent stroke.   Plan: -Discussed signs/sypmtoms of stroke and to call 911 if he  has any of those symptoms. -Counseled on signs/symptoms and treatment of hypoglycemia -[facility] community nurse called Dr Yvette Rack office to notify of elevated blood pressure while in home.  -Will not make additional HTN pharmacotherapy recommendations to Dr [name] as he has refused recommendations in the past despite continually elevated patient home blood pressure readings.  Will await followup call from Dr Yvette Rack office to [facility] community nurse and hypertension clinic visit -Instructed patient to continue to document his blood glucose and blood pressure readings from home and to take them to the hypertension specialist visit and to send them to Dr [name] on 07/20/16. -Will followup in 1 month following hypertension visit.   [name], PharmD [facility] PGY2 Pharmacy Resident [phone]  [facility] CM Care Plan Problem One   Flowsheet Row Most Recent Value  Care Plan Problem One  Knowledge deficit of diabetes management related to insulin administration  Role Documenting the Problem One  Clinical Pharmacist  Care Plan for Problem One  Active  THN CM Short Term Goal #1 (0-30  days)  Patient will administer his Novolog insulin before meals and not within 3 hours of the previous Novolog administration over the next 30 days as measured by patient report  THN CM Short Term Goal #1 Start Date  06/06/16  [facility] CM Short Term Goal #1 Met Date  07/04/16  Interventions for Short Term Goal #1  Counseled patient on signs/symptoms/treatment of hypoglycemia. Instructed him to take insulin before meals not after meals and to not take bolus novolog within 3 hours of giving the prior dose if his meals are within that time period  [facility] CM Short Term Goal #2 (0-30 days)  Patient will continue to check his blood glucose 1-2 times per day and his blood pressure at least daily over the next 30 days  THN CM Short Term Goal #2 Start Date  07/04/16  Interventions for Short Term Goal #2  Discussed importance of blood pressure and diabetes  control to prevent further cardiovascular risk.  Discussed the importance of attending his hypertension specialist visit in the next few weeks.

## 2016-07-04 NOTE — Patient Outreach (Signed)
[facility]) Care Management   07/04/2016  Mark Stewart 1946-06-20 [id number]  Mark Stewart is an 70 y.o. male Joint home visit with [place]. Subjective: Patient reports the depression is same. States that he discussed this with his primary  MD who wanted to have clonidine reevaluated by a hypertensive clinic at [facility].  Appointment pending in October.  Patient reports that he is walking about 3 times per week. States that he continues to sleep a lot.  CBG range 107-370.  today's reading of 289. Denies any low CBG   BP readings remain elevated 160-180/90-110.   Patient denies any headaches.  Patient reports biggest concern right now is his lack of energy.  Reports that his balance is off but no falls.    Objective:  Reviewed care everywhere and noted Hgb A1c of 8.5 .  Patient is awake and alert. Ambulating well. To note: patient denies any headache, blurred vision, dizziness, weakness. Vitals:   07/04/16 1155 07/04/16 1159 07/04/16 1201  BP: (!) 200/82 (!) 202/90 (!) 204/97  Pulse: 73    SpO2: 98%    Weight: 186 lb (84.4 kg)     Review of Systems  Constitutional: Positive for malaise/fatigue.  HENT: Negative.   Eyes: Negative.   Respiratory: Positive for cough.        Reports cough for about 8 months.   Cardiovascular: Negative.   Gastrointestinal: Negative.   Genitourinary: Negative.   Musculoskeletal: Negative.  Negative for falls.  Skin: Negative.   Neurological:       Reports altered balance  Endo/Heme/Allergies: Negative.   Psychiatric/Behavioral: Positive for depression.    Physical Exam  Constitutional: He is oriented to person, place, and time. He appears well-developed and well-nourished.  Cardiovascular: Normal rate and regular rhythm.   Respiratory: Effort normal and breath sounds normal.  Lungs clear  GI: Soft. Bowel sounds are normal.  Musculoskeletal: Normal range of motion. He exhibits edema.  1 plus both lower legs   Neurological: He is alert and oriented to person, place, and time.  Skin: Skin is warm and dry.  Psychiatric: He has a normal mood and affect. His behavior is normal. Judgment and thought content normal.    Encounter Medications:   Outpatient Encounter Prescriptions as of 07/04/2016  Medication Sig  . Amlodipine-Valsartan-HCTZ 10-160-12.5 MG TABS Take 1 tablet by mouth daily.  Marland Kitchen aspirin EC 325 MG tablet Take 1 tablet (325 mg total) by mouth daily.  Marland Kitchen atorvastatin (LIPITOR) 80 MG tablet Take 80 mg by mouth daily.  . carvedilol (COREG) 25 MG tablet Take 25 mg by mouth 2 (two) times daily.  . cloNIDine (CATAPRES) 0.1 MG tablet Take 0.05 mg by mouth 4 (four) times daily -  with meals and at bedtime.   . fluticasone (FLONASE) 50 MCG/ACT nasal spray Place 2 sprays into both nostrils daily as needed for allergies or rhinitis. Reported on 04/26/2016  . insulin aspart (NOVOLOG) 100 UNIT/ML FlexPen Inject 5-10 Units into the skin 3 (three) times daily with meals. 5 units before breakfast, 10 units before lunch, 5 units before supper  . Insulin Detemir (LEVEMIR FLEXTOUCH) 100 UNIT/ML Pen Inject 35 Units into the skin daily. (Patient taking differently: Inject 15 Units into the skin daily. )  . metFORMIN (GLUCOPHAGE) 1000 MG tablet Take 1,000 mg by mouth 2 (two) times daily with a meal.   No facility-administered encounter medications on file as of 07/04/2016.     Functional Status:   In  your present state of health, do you have any difficulty performing the following activities: 06/06/2016 05/02/2016  Hearing? N Y  Vision? N Y  Difficulty concentrating or making decisions? N Y  Walking or climbing stairs? N N  Dressing or bathing? N N  Doing errands, shopping? N N  Preparing Food and eating ? - N  Using the Toilet? - N  In the past six months, have you accidently leaked urine? - N  Do you have problems with loss of bowel control? - N  Managing your Medications? - N  Managing your Finances? - N   Housekeeping or managing your Housekeeping? - N  Some recent data might be hidden    Fall/Depression Screening:    PHQ 2/9 Scores 06/18/2016 06/06/2016 06/06/2016 05/02/2016 04/30/2016  PHQ - 2 Score 6 4 0 0 1  PHQ- 9 Score 14 13 - - -    Assessment:   (1) Hgb A1c down to 8.5( on 06/20/2016) from 10.7  ( 02/27/2016). (2) reports no changes in depression. States that he is tired and sleepy all the time. (3) High blood pressure readings since last home visit. Reported talked with MD and has an appointment with hypertension clinic on 07/24/2016.  Plan: (1) encouraged patient to continue to follow diet, monitor daily, exercise and take medications as prescribed. (2) Encouraged patient to exercise as much as he can. (3) 12:04pm  Placed call to MD office to reports high blood pressure readings.  Reported all readings from todays home visit and a review of recent home monitor readings.  Spoke with Judeen Hammans who states MD's at lunch and she will call me back with suggestion after 1pm.  Reviewed with Judeen Hammans that patient has no headache or dizziness at this time. Confirmed referral appointment with Hypertension Clinic at [facility] on 07/24/2016.  Inquired about contact number and I was told "wife has the information" 12:30pm  Placed call to hypertension clinic to inquire about an earlier appointment. Spoke with Kennyth Lose. Transferred to [employer]. Left a message requesting a call back.   This note faxed to Dr. [name] office. Next home visit planned for October 18th    Reviewed with patient that I would call back as soon as I hear from MD about suggestions. Patient voiced understanding of need to call 911 for any symptoms of stroke.  Reviewed signs and symptoms of stroke. All medications reviewed by [name] pharmacist [place] during home visit.    [facility] CM Care Plan Problem One   Flowsheet Row Most Recent Value  Care Plan Problem One  Knowledge deficit of diabetes related to dietary modifications  Role  Documenting the Problem One  Care Management Coordinator  Care Plan for Problem One  Active  THN Long Term Goal (31-90 days)  Patient will be able to create a menu for the week choosing appropriate foods in the next 60 days  THN Long Term Goal Start Date  04/30/16  [facility] Long Term Goal Met Date  07/04/16  Interventions for Problem One Long Term Goal  Reviewed importance of daily meal planning.  THN CM Short Term Goal #1 (0-30 days)  patient will identify 2 foods high in protein within the next 3 weeks  THN CM Short Term Goal #1 Start Date  04/30/16  [facility] CM Short Term Goal #1 Met Date  06/07/16  Interventions for Short Term Goal #1  Educate patient about food types:  simple carbs, proteins, fats  THN CM Short Term Goal #2 (0-30 days)  patient will log all consumed foods in a journal over the next 30 days  THN CM Short Term Goal #2 Start Date  04/30/16  [facility] CM Short Term Goal #2 Met Date  06/07/16  Interventions for Short Term Goal #2  Reviewed high protein foods and provided EMMI education on DM and diets. THN DM folder provided to patient    [facility] CM Care Plan Problem Two   Flowsheet Row Most Recent Value  Care Plan Problem Two  active  Role Documenting the Problem Two  Care Management [place] for Problem Two  Active  Interventions for Problem Two Long Term Goal   will place call to MD office to make a follow up appointment for labs and office visit.  THN Long Term Goal (31-90) days  Patient will report decrease in a1c in the next 90 days.  THN Long Term Goal Start Date  05/02/16  THN Long Term Goal Met Date  07/04/16  THN CM Short Term Goal #1 (0-30 days)  Patient will record CBG readings and comments in [facility] calendar for the next 30 days  THN CM Short Term Goal #1 Start Date  05/02/16  [facility] CM Short Term Goal #1 Met Date   06/07/16  Interventions for Short Term Goal #2   Reviewed importance of eating a daily bedtime snack.  THN CM Short Term Goal #2 (0-30 days)  Patient will  report exercising 10 minutes per day for the next 30 days.  THN CM Short Term Goal #2 Start Date  07/04/16  Interventions for Short Term Goal #2  Reviewed benefits of exercise post stroke and for DM control., Encouraged patient to discuss with physical therapist his need for home exercise plan.    [facility] CM Care Plan Problem Three   Flowsheet Row Most Recent Value  Care Plan Problem Three  Alteration in blood pressure  Role Documenting the Problem Three  Care Management Coordinator  Care Plan for Problem Three  Active  THN Long Term Goal (31-90) days  Patient will report decreased blood pressure in the next 90 days.  THN Long Term Goal Start Date  07/04/16  Interventions for Problem Three Long Term Goal  reviewed importance of low salt diet, exercise, and taking medications as prescribed.  THN CM Short Term Goal #1 (0-30 days)  Patient will be able to verbalize signs of stoke and iaction plan in the next 30 days  THN CM Short Term Goal #1 Start Date  07/04/16  Interventions for Short Term Goal #1  reviwed signs of stroke and need to call 911 immediately for symptoms.  THN CM Short Term Goal #2 (0-30 days)  Patient will record daily BP readings and call Md for symptoms in the next 30 days  THN CM Short Term Goal #2 Start Date  07/04/16  Interventions for Short Term Goal #2  Placed call to MD office and reported findings. Reviewed with patient importance of good blood pressure control. Also placed call to [facility] Hypertension clinic in attempt to secure a early appointment due to BP readings.     Tomasa Rand, RN, BSN, CEN [facility] [employer] [phone]

## 2016-07-05 ENCOUNTER — Other Ambulatory Visit: Payer: Self-pay

## 2016-07-05 DIAGNOSIS — Z23 Encounter for immunization: Secondary | ICD-10-CM | POA: Diagnosis not present

## 2016-07-05 NOTE — Patient Outreach (Signed)
Follow up care coordination:  Received 2 calls from fax machine from MD office on mobile number. Placed call to MD office and spoke with [facility] She reports that patient is coming in today for office visit at 10:45am.  I provided correct [facility] fax number to [facility].  PLAN: will continue to follow up with patient and monitor BP.  Tomasa Rand, RN, BSN, CEN [facility] [employer] [phone]

## 2016-07-06 ENCOUNTER — Other Ambulatory Visit: Payer: Self-pay

## 2016-07-06 NOTE — Patient Outreach (Signed)
Care coordination: Placed call to patient to review his pending appointment at the hypertension clinic.  Discussed with patient that appointment has been moved up to Tuesday 07/10/2016 at 10am and to be removed on 07/11/2016 at 1030 am.  Advised patient to go to the 5th floor [employer].  Patient voiced understanding. Reports that he saw his MD and meds were adjusted.  Patient reports that he thinks carvedilol was increased to 1.5 tablets and clonidine changed to twice a day. States that he has it wrote down but does not have the paperwork with his at the time of this call.  PLAN: encouraged patient to continue to monitor and report abnormal blood pressure readings.  Patient is very happy about assistance with moving up his appointment.   Will follow up with patient as needed or scheduled home visit.  Will update [facility] pharmacist.  Tomasa Rand, RN, BSN, CEN [place] Coordinator [phone]

## 2016-07-11 DIAGNOSIS — E113293 Type 2 diabetes mellitus with mild nonproliferative diabetic retinopathy without macular edema, bilateral: Secondary | ICD-10-CM | POA: Diagnosis not present

## 2016-07-24 ENCOUNTER — Encounter: Payer: Self-pay | Admitting: *Deleted

## 2016-07-24 ENCOUNTER — Other Ambulatory Visit: Payer: Self-pay | Admitting: *Deleted

## 2016-07-24 NOTE — Patient Outreach (Signed)
  [facility]) Care Management  [facility] Social Work  07/24/2016  Mark Stewart [birth year] [id number]  Subjective:  Patient with depression, but reports "it's better".   Encounter Medications:  Outpatient Encounter Prescriptions as of 07/24/2016  Medication Sig  . Amlodipine-Valsartan-HCTZ 10-160-12.5 MG TABS Take 1 tablet by mouth daily.  Marland Kitchen aspirin EC 325 MG tablet Take 1 tablet (325 mg total) by mouth daily.  Marland Kitchen atorvastatin (LIPITOR) 80 MG tablet Take 80 mg by mouth daily.  . carvedilol (COREG) 25 MG tablet Take 25 mg by mouth 2 (two) times daily.  . cloNIDine (CATAPRES) 0.1 MG tablet Take 0.05 mg by mouth 4 (four) times daily -  with meals and at bedtime.   . fluticasone (FLONASE) 50 MCG/ACT nasal spray Place 2 sprays into both nostrils daily as needed for allergies or rhinitis. Reported on 04/26/2016  . insulin aspart (NOVOLOG) 100 UNIT/ML FlexPen Inject 5-10 Units into the skin 3 (three) times daily with meals. 5 units before breakfast, 10 units before lunch, 5 units before supper  . Insulin Detemir (LEVEMIR FLEXTOUCH) 100 UNIT/ML Pen Inject 35 Units into the skin daily. (Patient taking differently: Inject 15 Units into the skin daily. )  . metFORMIN (GLUCOPHAGE) 1000 MG tablet Take 1,000 mg by mouth 2 (two) times daily with a meal.   No facility-administered encounter medications on file as of 07/24/2016.     Functional Status:  In your present state of health, do you have any difficulty performing the following activities: 06/06/2016 05/02/2016  Hearing? N Y  Vision? N Y  Difficulty concentrating or making decisions? N Y  Walking or climbing stairs? N N  Dressing or bathing? N N  Doing errands, shopping? N N  Preparing Food and eating ? - N  Using the Toilet? - N  In the past six months, have you accidently leaked urine? - N  Do you have problems with loss of bowel control? - N  Managing your Medications? - N  Managing your Finances? - N  Housekeeping or  managing your Housekeeping? - N  Some recent data might be hidden    Fall/Depression Screening:  PHQ 2/9 Scores 06/18/2016 06/06/2016 06/06/2016 05/02/2016 04/30/2016  PHQ - 2 Score 6 4 0 0 1  PHQ- 9 Score 14 13 - - -    Assessment: Patient reports that he and his PCP discussed his depression and decided not to take medication- He declines any mental health counseling and appreciates the follow up. Patient encouraged to follow up with his PCP regarding the depression if symptoms worsen.  "I believe I am somewhat better and continuing to be".  Denies SI.HI at this time as well as further CSW support/needs.    Plan: Close case and advise PCP and [facility] team .   [name], MSW, [place] Worker  [place] [phone]

## 2016-07-25 ENCOUNTER — Other Ambulatory Visit: Payer: Self-pay | Admitting: Pharmacist

## 2016-07-25 ENCOUNTER — Other Ambulatory Visit: Payer: Self-pay

## 2016-07-25 NOTE — Patient Outreach (Signed)
[name]) Care Management  [place]   07/25/16  Mark Stewart 30-May-1946 [id number]  Subjective: Patient was referred to hypertension specialist on 10/3 for ambulatory blood pressure monitoring.  Patient reports the results were sent to Dr [name] and he has an appointment on 10/24 but patient believes this appointment is for his regular checkup not blood pressure titration. Wife reports that patient has not been exercising as much 1-2 times every 2-3 weeks. She states he has still been sleeping a lot.  Patient states that he doesn't want to exercise but he does enjoy walking while shopping.  Patient states that he has discussed his depression with Dr [name]. Patient states adherence to a low sodium diet. He states that he has difficulty with his low carbohydrate diet especially with the bread. Today he states that overall his health is "pretty good." He states that he has some yard work that he needs to do and wants to start doing this to help with his exercise under the surveillance of his wife watching to make sure he is okay.   Reports drinking grape juice late last night which attributed to his higher CBG today of 236 mg/dL.  Denies hypoglycemia.  Denies blurred vision, headaches, dizziness.  Denies signs/symptoms of orthostasis. Denies vision changes but states that his last eye exam was on 07/11/16. Reports self foot exams and denies problems or changes with his feet. Denies lower extremity edema.   BP range 141-191/77-131.  Mostly running 160-170 /90's.  CBG range 111-286.  Objective:  BP 192/82 Heart Rate 65  Pulse Ox 98%  Encounter Medications: Outpatient Encounter Prescriptions as of 07/25/2016  Medication Sig  . Amlodipine-Valsartan-HCTZ 10-160-12.5 MG TABS Take 1 tablet by mouth daily.  Marland Kitchen aspirin EC 325 MG tablet Take 1 tablet (325 mg total) by mouth daily.  Marland Kitchen atorvastatin (LIPITOR) 80 MG tablet Take 80 mg by mouth daily.  . carvedilol (COREG) 25 MG  tablet Take 37.5 mg by mouth 2 (two) times daily.   . cloNIDine (CATAPRES) 0.1 MG tablet Take 0.1 mg by mouth 2 (two) times daily.   . fluticasone (FLONASE) 50 MCG/ACT nasal spray Place 2 sprays into both nostrils daily as needed for allergies or rhinitis. Reported on 04/26/2016  . insulin aspart (NOVOLOG) 100 UNIT/ML FlexPen Inject 5-10 Units into the skin 3 (three) times daily with meals. 5 units before breakfast, 10 units before lunch, 5 units before supper  . Insulin Detemir (LEVEMIR FLEXTOUCH) 100 UNIT/ML Pen Inject 35 Units into the skin daily. (Patient taking differently: Inject 15 Units into the skin daily. )  . metFORMIN (GLUCOPHAGE) 1000 MG tablet Take 1,000 mg by mouth 2 (two) times daily with a meal.   No facility-administered encounter medications on file as of 07/25/2016.     Functional Status: In your present state of health, do you have any difficulty performing the following activities: 06/06/2016 05/02/2016  Hearing? N Y  Vision? N Y  Difficulty concentrating or making decisions? N Y  Walking or climbing stairs? N N  Dressing or bathing? N N  Doing errands, shopping? N N  Preparing Food and eating ? - N  Using the Toilet? - N  In the past six months, have you accidently leaked urine? - N  Do you have problems with loss of bowel control? - N  Managing your Medications? - N  Managing your Finances? - N  Housekeeping or managing your Housekeeping? - N  Some recent data might be hidden  Fall/Depression Screening: PHQ 2/9 Scores 06/18/2016 06/06/2016 06/06/2016 05/02/2016 04/30/2016  PHQ - 2 Score 6 4 0 0 1  PHQ- 9 Score 14 13 - - -    Assessment: Diabetes:A1C currently above goal of <7% but improved from 10.7% to 8.5% on metformin, Levemir, and Novolog. His Novolog was decreased by Dr [name] due to hypoglycemic episodes to Novolog 5-10-5 units with breakfast, lunch, and dinner respectively.    Hypertension: Remains above goal of <140/90 on  amlodipine/valsartan/hydrochlorothizide and clonidine. Valsartan and hydrochlorothiazide are not at max doses. [facility] pharmacy has previously recommended increasing valsartan but no changes were made.  After the last visit Miller reported systolic blood pressure >383 mm Hg and Dr [name] increased his clonidine and Coreg and referred [name] to a hypertension specialist at [facility] in which he received ambulatory blood pressure monitoring but no further changes were made. Consistently elevated blood pressures >170/90s place patient at increased risk of recurrent stroke.   Plan: Discussed low carbohydrate and low sodium diet.  Counseled on signs and symptoms of hypoglycemia Will followup with Neurology about blood pressure management Discussed signs/symptoms of a stroke and to call 911 if he experiences these symptoms. Instructed patient to continue to document his blood glucose and blood pressure readings from home and to take them to the neurology visit and to send them to Dr [name]  [name], PharmD [facility] PGY2 Pharmacy Resident [phone]  [facility] CM Care Plan Problem One   Flowsheet Row Most Recent Value  Care Plan Problem One  Knowledge deficit of diabetes management related to insulin administration  Role Documenting the Problem One  Clinical Pharmacist  Care Plan for Problem One  Active  THN CM Short Term Goal #1 (0-30 days)  Patient will administer his Novolog insulin before meals and not within 3 hours of the previous Novolog administration over the next 30 days as measured by patient report  THN CM Short Term Goal #1 Start Date  06/06/16  [facility] CM Short Term Goal #1 Met Date  07/04/16  Interventions for Short Term Goal #1  Counseled patient on signs/symptoms/treatment of hypoglycemia. Instructed him to take insulin before meals not after meals and to not take bolus novolog within 3 hours of giving the prior dose if his meals are within that time period  [facility] CM Short Term  Goal #2 (0-30 days)  Patient will continue to check his blood glucose 1-2 times per day and his blood pressure at least daily over the next 30 days  THN CM Short Term Goal #2 Start Date  07/04/16  Interventions for Short Term Goal #2  Discussed importance of blood pressure and diabetes control to prevent further cardiovascular risk.  Discussed the importance of attending his hypertension specialist visit in the next few weeks.

## 2016-07-25 NOTE — Patient Outreach (Signed)
[facility]) Care Management   07/25/2016  Mark Stewart 1945-10-18 [id number]  Mark Stewart is an 70 y.o. male Joint visit with [name] pharmacist.  Subjective: Patient reports that he went to the hypertension clinic for monitoring. Patient reports that he has not gotten any results from BP monitoring.  Patient reports that he has an appointment with MD next.   Patient denies any recent nose bleeds, dizziness, blurred vision or headaches. Reports that he is walking about 1 to 2 times per 2 to 3 weeks.  Reports no interest in exercising right now.   BP range 141-191/77-131.  Mostly running 160-170 /90's.  CBG range 111-286.  CBG today 236.  Reports drinking grape juice late last night. .  No recent low blood sugar readings.  Patient reports that he spoke with MD about depression but no medications prescribed.  Reports following low salt diet. Reports not following DM diet. Reports that he eats a lot of sandwiches. Patient reports that he continues to be very sleepy during the day.   Objective:   Vitals:   07/25/16 1055 07/25/16 1056  BP: (!) 192/82 (!) 200/80  Pulse:  65  Resp:  18  SpO2:  98%   Vitals:   07/25/16 1055  BP: (!) 192/82   Review of Systems  Constitutional: Negative.   HENT: Negative.   Eyes: Negative.   Respiratory: Negative.   Cardiovascular: Negative.   Gastrointestinal: Negative.   Genitourinary: Negative.   Musculoskeletal: Negative.   Skin: Negative.   Neurological: Negative.   Endo/Heme/Allergies: Negative.   Psychiatric/Behavioral: Positive for depression.    Physical Exam  Constitutional: He is oriented to person, place, and time. He appears well-developed and well-nourished.  Cardiovascular: Normal rate and normal heart sounds.   Respiratory: Effort normal and breath sounds normal.  GI: Soft. Bowel sounds are normal.  Musculoskeletal: Normal range of motion. He exhibits no edema.  Neurological: He is alert and oriented  to person, place, and time.  Skin: Skin is warm and dry.  Feet without lesions or sores.  Psychiatric: He has a normal mood and affect. His behavior is normal. Judgment and thought content normal.    Encounter Medications:  Medications review by [facility] pharmacist. Outpatient Encounter Prescriptions as of 07/25/2016  Medication Sig  . Amlodipine-Valsartan-HCTZ 10-160-12.5 MG TABS Take 1 tablet by mouth daily.  Marland Kitchen aspirin EC 325 MG tablet Take 1 tablet (325 mg total) by mouth daily.  Marland Kitchen atorvastatin (LIPITOR) 80 MG tablet Take 80 mg by mouth daily.  . carvedilol (COREG) 25 MG tablet Take 25 mg by mouth 2 (two) times daily.  . cloNIDine (CATAPRES) 0.1 MG tablet Take 0.05 mg by mouth 4 (four) times daily -  with meals and at bedtime.   . fluticasone (FLONASE) 50 MCG/ACT nasal spray Place 2 sprays into both nostrils daily as needed for allergies or rhinitis. Reported on 04/26/2016  . insulin aspart (NOVOLOG) 100 UNIT/ML FlexPen Inject 5-10 Units into the skin 3 (three) times daily with meals. 5 units before breakfast, 10 units before lunch, 5 units before supper  . Insulin Detemir (LEVEMIR FLEXTOUCH) 100 UNIT/ML Pen Inject 35 Units into the skin daily. (Patient taking differently: Inject 15 Units into the skin daily. )  . metFORMIN (GLUCOPHAGE) 1000 MG tablet Take 1,000 mg by mouth 2 (two) times daily with a meal.   No facility-administered encounter medications on file as of 07/25/2016.     Functional Status:   In your present state  of health, do you have any difficulty performing the following activities: 06/06/2016 05/02/2016  Hearing? N Y  Vision? N Y  Difficulty concentrating or making decisions? N Y  Walking or climbing stairs? N N  Dressing or bathing? N N  Doing errands, shopping? N N  Preparing Food and eating ? - N  Using the Toilet? - N  In the past six months, have you accidently leaked urine? - N  Do you have problems with loss of bowel control? - N  Managing your Medications? - N   Managing your Finances? - N  Housekeeping or managing your Housekeeping? - N  Some recent data might be hidden    Fall/Depression Screening:    PHQ 2/9 Scores 06/18/2016 06/06/2016 06/06/2016 05/02/2016 04/30/2016  PHQ - 2 Score 6 4 0 0 1  PHQ- 9 Score 14 13 - - -    Assessment:   (1) Bp elevated at todays home visit. Not symptomatic. Denies headache, blurred vision, weakness, confusion or dizziness. BP log supports continued elevated BP. Pending results of BP monitoring at [facility]. Has a follow up with neuro on 10/23 and Dr. [name] on 10/24.  Wife call to inquire about BP monitoring results and is waiting for a call back. (2) CBG today of 236.  Average about 150-180 daily ( fasting) denies any low readings. (3) continues to have lack of energy and feels depressed.   Plan:  (1) Will send this note to MD. Reviewed signs of stroke and need to call 911 for any symptoms. Encouraged patient to continue to take medications as prescribed. Patient able to teach back reasons to call 911. (2) Patient will have labs drawn on 10/24. Reports that he will go to MD office fasting. Encouraged patient to follow DM diet. (3) Encourage patient to exercise and discuss concerns with MD at office visit next week.  This note sent to MD. Next joint home visit planned with [facility] pharmacist on 08/14/2016.  [facility] CM Care Plan Problem One   Flowsheet Row Most Recent Value  Care Plan Problem One  Knowledge deficit of diabetes related to dietary modifications  Role Documenting the Problem One  Care Management Coordinator  Care Plan for Problem One  Active  THN Long Term Goal (31-90 days)  Patient will be able to create a menu for the week choosing appropriate foods in the next 60 days  THN Long Term Goal Start Date  04/30/16  [facility] Long Term Goal Met Date  07/04/16  Interventions for Problem One Long Term Goal  Reviewed importance of daily meal planning.  THN CM Short Term Goal #1 (0-30 days)  patient will identify 2  foods high in protein within the next 3 weeks  THN CM Short Term Goal #1 Start Date  04/30/16  [facility] CM Short Term Goal #1 Met Date  06/07/16  Interventions for Short Term Goal #1  Educate patient about food types:  simple carbs, proteins, fats  THN CM Short Term Goal #2 (0-30 days)  patient will log all consumed foods in a journal over the next 30 days  THN CM Short Term Goal #2 Start Date  04/30/16  [facility] CM Short Term Goal #2 Met Date  06/07/16  Interventions for Short Term Goal #2  Reviewed high protein foods and provided EMMI education on DM and diets. THN DM folder provided to patient    [facility] CM Care Plan Problem Two   Flowsheet Row Most Recent Value  Care Plan Problem Two  active  Role Documenting the Problem Two  Care Management Coordinator  Care Plan for Problem Two  Active  Interventions for Problem Two Long Term Goal   will place call to MD office to make a follow up appointment for labs and office visit.  THN Long Term Goal (31-90) days  Patient will report decrease in a1c in the next 90 days.  THN Long Term Goal Start Date  05/02/16  THN Long Term Goal Met Date  07/04/16  THN CM Short Term Goal #1 (0-30 days)  Patient will record CBG readings and comments in [facility] calendar for the next 30 days  THN CM Short Term Goal #1 Start Date  05/02/16  [facility] CM Short Term Goal #1 Met Date   06/07/16  Interventions for Short Term Goal #2   Reviewed importance of eating a daily bedtime snack.  THN CM Short Term Goal #2 (0-30 days)  Patient will report exercising 10 minutes per day for the next 30 days.  THN CM Short Term Goal #2 Start Date  07/04/16  [facility] CM Short Term Goal #2 Met Date  07/25/16  Interventions for Short Term Goal #2  Reviewed benefits of exercise post stroke and for DM control., Encouraged patient to discuss with physical therapist his need for home exercise plan.    [facility] CM Care Plan Problem Three   Flowsheet Row Most Recent Value  Care Plan Problem Three  Alteration in blood  pressure  Role Documenting the Problem Three  Care Management Coordinator  Care Plan for Problem Three  Active  THN Long Term Goal (31-90) days  Patient will report decreased blood pressure in the next 90 days.  THN Long Term Goal Start Date  07/04/16  Interventions for Problem Three Long Term Goal  Reviewed importance of low salt diet and calling MD for high BP readings.  THN CM Short Term Goal #1 (0-30 days)  Patient will be able to verbalize signs of stoke and iaction plan in the next 30 days  THN CM Short Term Goal #1 Start Date  07/04/16  [facility] CM Short Term Goal #1 Met Date  07/25/16  Interventions for Short Term Goal #1  reviwed signs of stroke and need to call 911 immediately for symptoms.  THN CM Short Term Goal #2 (0-30 days)  Patient will record daily BP readings and call Md for symptoms in the next 30 days  THN CM Short Term Goal #2 Start Date  07/04/16  [facility] CM Short Term Goal #2 Met Date  07/25/16  Interventions for Short Term Goal #2  Placed call to MD office and reported findings. Reviewed with patient importance of good blood pressure control. Also placed call to [facility] Hypertension clinic in attempt to secure a early appointment due to BP readings.     Tomasa Rand, RN, BSN, CEN [facility] [employer] [phone]

## 2016-07-30 ENCOUNTER — Encounter: Payer: Self-pay | Admitting: Nurse Practitioner

## 2016-07-30 ENCOUNTER — Ambulatory Visit (INDEPENDENT_AMBULATORY_CARE_PROVIDER_SITE_OTHER): Payer: PPO | Admitting: Nurse Practitioner

## 2016-07-30 VITALS — BP 131/78 | HR 63 | Ht 72.0 in | Wt 192.0 lb

## 2016-07-30 DIAGNOSIS — I10 Essential (primary) hypertension: Secondary | ICD-10-CM | POA: Diagnosis not present

## 2016-07-30 DIAGNOSIS — E785 Hyperlipidemia, unspecified: Secondary | ICD-10-CM

## 2016-07-30 DIAGNOSIS — I639 Cerebral infarction, unspecified: Secondary | ICD-10-CM

## 2016-07-30 DIAGNOSIS — E1165 Type 2 diabetes mellitus with hyperglycemia: Secondary | ICD-10-CM | POA: Diagnosis not present

## 2016-07-30 NOTE — Patient Instructions (Signed)
Continue aspirin for secondary stroke prevention Maintain strict control of hypertension with blood pressure goal below 130/90, today's reading 131/78 continue antihypertensive medications Control of diabetes with hemoglobin A1c below 7 followed by primary care most recent hemoglobin A1c8.5  continue diabetic medications  Cholesterol with LDL cholesterol less than 70, followed by primary care,  most recent 71  continue Lipitor Exercise by walking, slowly increase , eat healthy diet with whole grains,  fresh fruits and vegetables Continue home exercise program Follow-up in 6 months

## 2016-07-30 NOTE — Progress Notes (Signed)
[facility]  PATIENT: Mark Stewart DOB: June 21, 1946   REASON FOR VISIT:  follow-up for right thalamic infarct secondary to small vessel disease, memory loss since stroke, risk factors of hypertension hyperlipidemia and insulin-dependent diabetes not well controlled HISTORY FROM: Patient and wife    HISTORY OF PRESENT ILLNESS:UPDATE 07/30/2016 Mark Stewart, 70 year old male returns for follow-up. He has a history of acute right ischemic thalamic infarction which occurred in May 2017. He remains on aspirin for secondary stroke prevention without significant bruising or bleeding. He denies further stroke or TIA symptoms. Blood pressure is well controlled in the office today at 131/78. He remains on Coreg Catapres and valsartan. He exercises very little. He does not perform his home exercise program. Most recent hemoglobin A1c 8.5 on 06/20/2016. He is also Lipitor without complaints of muscle aches. She returns for reevaluation  HISTORY 04/26/16 Mark Stewart is an 70 y.o. male with a history of diabetes mellitus, hypertension and hyperlipidemia presenting with a complaint of dizziness and unstable gait for about 4 days. He was initially seen in the ED at [facility] on 02/27/16  and subsequently transferred to [facility] for further management. MRI of his brain showed acute right ischemic thalamic infarction. He had no change in speech. Family has noted slight droop of left side of his face. He has not experienced weakness nor sensory changes involving extremities. He has no previous history of stroke nor TIA. He has not been taking aspirin consistently on a daily basis. NIH stroke score was 2. He was LKW 02/22/2016, time unclear. Patient was not administered IV t-PA secondary to delay in arrival. He was admitted for further evaluation and treatment.CTA head/neck - No significant large vessel finding. High grade stenosis R P4 2D Echo EF 55-60% LDL 71 hemoglobin A1c of 10.7. He  returns for follow-up. He has not had further stroke or TIA symptoms. He is currently getting physical therapy for lower extremity weakness. He is not doing home exercise program at this point. He had recent changes to his insulin dose. He is keeping a log of his blood pressures. He has multiple questions. He returns for reevaluation   REVIEW OF SYSTEMS: Full 14 system review of systems performed and notable only for those listed, all others are neg:  Constitutional: Fatigue  Cardiovascular: neg Ear/Nose/Throat: neg  Skin: neg Eyes: neg Respiratory: neg Gastroitestinal: neg  Hematology/Lymphatic: Easy bruising Endocrine: neg Musculoskeletal: Walking difficulty Allergy/Immunology: neg Neurological: Memory loss, weakness Psychiatric: Confusion Sleep : neg   ALLERGIES: Allergies  Allergen Reactions  . Benazepril Cough  . Penicillins Rash    Has patient had a PCN reaction causing immediate rash, facial/tongue/throat swelling, SOB or lightheadedness with hypotension: yes Has patient had a PCN reaction causing severe rash involving mucus membranes or skin necrosis: no Has patient had a PCN reaction that required hospitalization: no Has patient had a PCN reaction occurring within the last 10 years: no If all of the above answers are "NO", then may proceed with Cephalosporin use.     HOME MEDICATIONS: Outpatient Medications Prior to Visit  Medication Sig Dispense Refill  . Amlodipine-Valsartan-HCTZ 10-160-12.5 MG TABS Take 1 tablet by mouth daily.  11  . aspirin EC 325 MG tablet Take 1 tablet (325 mg total) by mouth daily. 30 tablet 0  . atorvastatin (LIPITOR) 80 MG tablet Take 80 mg by mouth daily.    . carvedilol (COREG) 25 MG tablet Take 37.5 mg by mouth 2 (two) times daily.   11  .  cloNIDine (CATAPRES) 0.1 MG tablet Take 0.1 mg by mouth 2 (two) times daily.   5  . fluticasone (FLONASE) 50 MCG/ACT nasal spray Place 2 sprays into both nostrils daily as needed for allergies or  rhinitis. Reported on 04/26/2016    . insulin aspart (NOVOLOG) 100 UNIT/ML FlexPen Inject 5-10 Units into the skin 3 (three) times daily with meals. 5 units before breakfast, 10 units before lunch, 5 units before supper    . Insulin Detemir (LEVEMIR FLEXTOUCH) 100 UNIT/ML Pen Inject 35 Units into the skin daily. (Patient taking differently: Inject 15 Units into the skin daily. 15 units at night) 15 mL 11  . metFORMIN (GLUCOPHAGE) 1000 MG tablet Take 1,000 mg by mouth 2 (two) times daily with a meal.     No facility-administered medications prior to visit.     PAST MEDICAL HISTORY: Past Medical History:  Diagnosis Date  . Allergy   . Cataract    history of surgery bilaterally  . Diabetes mellitus without complication ([place])   . Hypercholesteremia   . Hypertension   . 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 Stroke [facility])     PAST SURGICAL HISTORY: Past Surgical History:  Procedure Laterality Date  . EYE SURGERY    . PROSTATE SURGERY    . SPLENECTOMY      FAMILY HISTORY: Family History  Problem Relation Age of Onset  . Breast cancer Mother   . Diabetes Mellitus II Father   . Breast cancer Sister     SOCIAL HISTORY: Social History   Social History  . Marital status: Married    Spouse name: N/A  . Number of children: N/A  . Years of education: N/A   Occupational History  . Not on file.   Social History Main Topics  . Smoking status: Former Research scientist (life sciences)  . Smokeless tobacco: Never Used     Comment: patient states last cigarette 35 years ago  . Alcohol use No  . Drug use: No  . Sexual activity: Not on file   Other Topics Concern  . Not on file   Social History Narrative  . No narrative on file     PHYSICAL EXAM  Vitals:   07/30/16 1245  BP: 131/78  Pulse: 63  Weight: 192 lb (87.1 kg)  Height: 6' (1.829 m)   Body mass index is 26.04 kg/m.  Generalized: Well developed, in no acute distress  Head: normocephalic and atraumatic,.  Oropharynx benign  Neck: Supple, no carotid bruits  Cardiac: Regular rate rhythm, no murmur  Musculoskeletal: No deformity   Neurological examination   Mentation: Alert oriented to time, place, history taking. Attention span and concentration appropriate. Recent and remote memory intact.  Follows all commands speech and language fluent.  Cranial nerve II-XII: Fundoscopic exam not done. Pupils were equal round reactive to light extraocular movements were full, visual field were full on confrontational test. Facial sensation and strength were normal. hearing was intact to finger rubbing bilaterally. Uvula tongue midline. head turning and shoulder shrug were normal and symmetric.Tongue protrusion into cheek strength was normal. Motor: normal bulk and tone, full strength in the BUE, BLE, fine finger movements normal, no pronator drift. No focal weakness Sensory: normal and symmetric to light touch, pinprick, and  Vibration, in the upper and lower extremities Coordination: finger-nose-finger, heel-to-shin bilaterally, no dysmetria. No tremor Reflexes: 1+ upper lower and symmetric, plantar responses were flexor bilaterally. Gait and Station: Rising up from seated position  without assistance, normal stance,  moderate stride, good arm swing, smooth turning, able to perform tiptoe, and heel walking without difficulty. Tandem gait is mildly unsteady. No assistive device  DIAGNOSTIC DATA (LABS, IMAGING, TESTING) - I reviewed patient records, labs, notes, testing and imaging myself where available.  Lab Results  Component Value Date   WBC 6.6 02/27/2016   HGB 14.2 02/27/2016   HCT 42.9 02/27/2016   MCV 91.9 02/27/2016   PLT 248 02/27/2016      Component Value Date/Time   NA 137 02/27/2016 0536   K 3.7 02/27/2016 0536   CL 103 02/27/2016 0536   CO2 28 02/27/2016 0536   GLUCOSE 251 (H) 02/27/2016 0536   BUN 8 02/27/2016 0536   CREATININE 0.71 02/27/2016 0536   CALCIUM 8.5 (L) 02/27/2016 0536    PROT 6.4 (L) 02/27/2016 0536   ALBUMIN 3.1 (L) 02/27/2016 0536   AST 14 (L) 02/27/2016 0536   ALT 16 (L) 02/27/2016 0536   ALKPHOS 72 02/27/2016 0536   BILITOT 0.8 02/27/2016 0536   GFRNONAA >60 02/27/2016 0536   GFRAA >60 02/27/2016 0536   Lab Results  Component Value Date   CHOL 121 02/27/2016   HDL 34 (L) 02/27/2016   LDLCALC 71 02/27/2016   TRIG 82 02/27/2016   CHOLHDL 3.6 02/27/2016   Lab Results  Component Value Date   HGBA1C 10.7 (H) 02/27/2016    ASSESSMENT AND PLAN  70 y.o. year old male  has a past medical history of Allergy; Cataract; Diabetes mellitus without complication ([place]); Hypercholesteremia; Hypertension; Spleen absent; and Stroke ([place]). here For  follow-up. MRI of his brain showed acute right ischemic thalamic infarction.Marland KitchenCTA head/neck - No significant large vessel finding. High grade stenosis R P4 2D Echo EF 55-60% LDL 71 hemoglobin A1c of 8.5. The patient is a current patient of Dr. [name]  who is out of the office today . This note is sent to the work in doctor.      PLAN: Stressed the importance of management of risk factors to prevent further stroke Continue aspirin for secondary stroke prevention Maintain strict control of hypertension with blood pressure goal below 130/90, today's reading 131/78 continue antihypertensive medications and f/u with PCP Control of diabetes with hemoglobin A1c below7 followed by primary care most recent hemoglobin A1c8.5  continue diabetic medications  Cholesterol with LDL cholesterol less than 70, followed by primary care,  most recent 71  continue Lipitor Exercise by walking, slowly increase , eat healthy diet with whole grains,  fresh fruits and vegetables Continue home exercise program Follow-up in 6 months Dennie Bible, [facility], [facility], APRN  [facility] [address], [place], [place] [address] [phone]

## 2016-07-30 NOTE — Progress Notes (Signed)
I have read the note, and I agree with the clinical assessment and plan.  Maurice Fotheringham KEITH   

## 2016-08-13 DIAGNOSIS — Z794 Long term (current) use of insulin: Secondary | ICD-10-CM | POA: Diagnosis not present

## 2016-08-13 DIAGNOSIS — E784 Other hyperlipidemia: Secondary | ICD-10-CM | POA: Diagnosis not present

## 2016-08-13 DIAGNOSIS — E118 Type 2 diabetes mellitus with unspecified complications: Secondary | ICD-10-CM | POA: Diagnosis not present

## 2016-08-13 DIAGNOSIS — I10 Essential (primary) hypertension: Secondary | ICD-10-CM | POA: Diagnosis not present

## 2016-08-14 ENCOUNTER — Other Ambulatory Visit: Payer: Self-pay

## 2016-08-15 NOTE — Patient Outreach (Signed)
[facility]) Care Management   08/15/2016  DEKE TILGHMAN 10/05/1946 [id number]  Mark Stewart is an 70 y.o. male 10:30 am  Arrived for home visit. Wife Pearline Cables present and engaged during home visit.  Subjective:  Patient reports that he saw Primary MD yesterday. Reports no results from BP monitoring. Patient states that he thinks primary MD is making him an appointment with the hypertension clinic.  Wife reports that she will call to inquire about results and pending MD appointment.  Patient reports that his blood pressure remains elevated daily.  Today home reading prior to my visit was 190/102.  Patient reports that he and his wife inquired about a dangerous range and when to go to the hospital.  Wife states that they were directed to call MD office for directions when BP is high.  BP log reviewed and BP range is consistently 180-200/ 90-100. ( see log in media tab)  Patient reports that in the last 4 days his fasting CBG has in the 300's.  Todays fasting CBG of 305.  Patient has misplaced his CBG log but when reviewing meter his range has been 200-300.  ( meter will not provide averages) Patient reports no changes in diet. States that he eats about 10 pieces of bread per day.  Reports that he has a good appetite. Patient continues to report lack of energy in the last 4-5 months.  Reports that he has been exercising better recently.  Patient reports that he requested a referral for an endocrinologist.  Reports that he was referred to a Dr. Tamala Julian in [facility] with [facility].  Appointment is planned for Dec. 21st.   Reviewed with patient to check to see if MD's he was referred to are in network by calling [facility] customer service to inquire.  Reviewed that this number is on the back of his insurance card.Also reviewed with patient and wife that they could inquire about what MD are in network if the one he has an appointment with is not in network. Patient and wife  indicated they would not pay for an MD that is not in network.    Objective:  Awake and sleepy. Yawning many times during home visit.  When ambulating balance is altered when first standing.  Vitals:   08/14/16 1100  BP: (!) 184/90  Pulse: 71  Resp: 18    Review of Systems  Constitutional: Positive for malaise/fatigue.  HENT: Negative.   Eyes: Negative.   Respiratory: Negative.   Cardiovascular: Negative.   Gastrointestinal: Negative.   Genitourinary: Negative.   Musculoskeletal: Negative.   Skin: Negative.   Neurological:       Reports balance is about the same since his stroke.   Endo/Heme/Allergies: Negative.   Psychiatric/Behavioral:       Reports being sleepy all the time and lacks motivation to do anything    Physical Exam  Constitutional: He is oriented to person, place, and time. He appears well-developed and well-nourished.  Cardiovascular: Normal rate, normal heart sounds and intact distal pulses.   Respiratory: Effort normal and breath sounds normal.  Lungs clear. Talking in complete sentences.   GI: Soft. Bowel sounds are normal.  Musculoskeletal: Normal range of motion.  Neurological: He is alert and oriented to person, place, and time.  Skin: Skin is warm and dry.  Declined foot exam. States MD did foot exam yesterday.  Psychiatric: He has a normal mood and affect. His behavior is normal. Judgment and thought content  normal.  sluggish    Encounter Medications:   Outpatient Encounter Prescriptions as of 08/14/2016  Medication Sig  . Amlodipine-Valsartan-HCTZ 10-160-12.5 MG TABS Take 0.5 tablets by mouth 2 (two) times daily.   Marland Kitchen aspirin EC 325 MG tablet Take 1 tablet (325 mg total) by mouth daily.  Marland Kitchen atorvastatin (LIPITOR) 80 MG tablet Take 80 mg by mouth daily.  . carvedilol (COREG) 25 MG tablet Take 37.5 mg by mouth 2 (two) times daily.   . cloNIDine (CATAPRES) 0.1 MG tablet Take 0.1 mg by mouth 2 (two) times daily.   . fluticasone (FLONASE) 50 MCG/ACT  nasal spray Place 2 sprays into both nostrils daily as needed for allergies or rhinitis. Reported on 04/26/2016  . insulin aspart (NOVOLOG) 100 UNIT/ML FlexPen Inject 5-10 Units into the skin 3 (three) times daily with meals. 5 units before breakfast, 10 units before lunch, 5 units before supper  . Insulin Detemir (LEVEMIR FLEXTOUCH) 100 UNIT/ML Pen Inject 35 Units into the skin daily. (Patient taking differently: Inject 15 Units into the skin daily. 15 units at night)  . metFORMIN (GLUCOPHAGE) 1000 MG tablet Take 1,000 mg by mouth 2 (two) times daily with a meal.   No facility-administered encounter medications on file as of 08/14/2016.     Functional Status:   In your present state of health, do you have any difficulty performing the following activities: 06/06/2016 05/02/2016  Hearing? N Y  Vision? N Y  Difficulty concentrating or making decisions? N Y  Walking or climbing stairs? N N  Dressing or bathing? N N  Doing errands, shopping? N N  Preparing Food and eating ? - N  Using the Toilet? - N  In the past six months, have you accidently leaked urine? - N  Do you have problems with loss of bowel control? - N  Managing your Medications? - N  Managing your Finances? - N  Housekeeping or managing your Housekeeping? - N  Some recent data might be hidden    Fall/Depression Screening:    PHQ 2/9 Scores 06/18/2016 06/06/2016 06/06/2016 05/02/2016 04/30/2016  PHQ - 2 Score 6 4 0 0 1  PHQ- 9 Score 14 13 - - -    Assessment:   (1) continues to have elevated daily blood pressure. Reports taking all medications as prescribed. Denies missing any meds. Reports following a low salt diet.  Reports increase in exercising. BP today during home visit elevated.   (2) Recently higher CBG in the 300's.  Lacks motivation to follow DM diets.  (3) pending endocrinology appointment in December. (4) patient thinks he is pending results of BP monitoring from 10/3 and hypertension clinic appointment.    Plan:   (1) Reviewed with patient when to call 911 for symptoms of stroke. Reviewed importance of exercising. Reviewed importance of taking all medications as prescribed.  Will send this note to MD to report high BP readings. (2)  Will notify MD about high CBG readings in the last 4-5 days. Pending follow up with endocrinology. Reviewed with patient DM diet and education material provided at previous home visit. Patient has agreed to decrease bread serving to 4 per day.  Will send CBG reading to MD on this note. (3) reviewed with patient how to determine if MD is in network. (4)Wife called MD office during home visit to inquire about results and follow up.   Goal setting during this home visit. Patient reports that he is trying to get better but does not know how. Reports  that he does not think things have improved in the last several months.  New goals set for modification in exercise and diet.    This note sent to MD. [facility] CM Care Plan Problem One   Flowsheet Row Most Recent Value  Care Plan Problem One  Knowledge deficit of diabetes related to dietary modifications  Role Documenting the Problem One  Care Management Coordinator  Care Plan for Problem One  Active  THN Long Term Goal (31-90 days)  Patient will be able to create a menu for the week choosing appropriate foods in the next 60 days  THN Long Term Goal Start Date  04/30/16  [facility] Long Term Goal Met Date  07/04/16  Interventions for Problem One Long Term Goal  Reviewed importance of daily meal planning.  THN CM Short Term Goal #1 (0-30 days)  patient will identify 2 foods high in protein within the next 3 weeks  THN CM Short Term Goal #1 Start Date  04/30/16  [facility] CM Short Term Goal #1 Met Date  06/07/16  Interventions for Short Term Goal #1  Educate patient about food types:  simple carbs, proteins, fats  THN CM Short Term Goal #2 (0-30 days)  patient will log all consumed foods in a journal over the next 30 days  THN CM Short Term Goal #2  Start Date  04/30/16  [facility] CM Short Term Goal #2 Met Date  06/07/16  Interventions for Short Term Goal #2  Reviewed high protein foods and provided EMMI education on DM and diets. THN DM folder provided to patient    [facility] CM Care Plan Problem Two   Flowsheet Row Most Recent Value  Care Plan Problem Two  active  Role Documenting the Problem Two  Care Management Liborio Negron Torres for Problem Two  Active  Interventions for Problem Two Long Term Goal   will place call to MD office to make a follow up appointment for labs and office visit.  THN Long Term Goal (31-90) days  Patient will report decrease in a1c in the next 90 days.  THN Long Term Goal Start Date  05/02/16  THN Long Term Goal Met Date  07/04/16  THN CM Short Term Goal #1 (0-30 days)  Patient will record CBG readings and comments in [facility] calendar for the next 30 days  THN CM Short Term Goal #1 Start Date  05/02/16  [facility] CM Short Term Goal #1 Met Date   06/07/16  Interventions for Short Term Goal #2   Reviewed importance of eating a daily bedtime snack.  THN CM Short Term Goal #2 (0-30 days)  Patient will report exercising 10 minutes per day for the next 30 days.  THN CM Short Term Goal #2 Start Date  07/04/16  [facility] CM Short Term Goal #2 Met Date  07/25/16  Interventions for Short Term Goal #2  Reviewed benefits of exercise post stroke and for DM control., Encouraged patient to discuss with physical therapist his need for home exercise plan.    [facility] CM Care Plan Problem Three   Flowsheet Row Most Recent Value  Care Plan Problem Three  Alteration in blood pressure  Role Documenting the Problem Three  Care Management Coordinator  Care Plan for Problem Three  Active  THN Long Term Goal (31-90) days  Patient will report decreased blood pressure in the next 90 days.  THN Long Term Goal Start Date  07/04/16  Interventions for Problem Three Long Term Goal  Encouraged  patient to continue to follow low salt diet, take medications as  prescribed and call MD for changes in condition.  Reviewed signs and symptoms of a stroke and reasons to call 911.  THN CM Short Term Goal #1 (0-30 days)  Patient will be able to verbalize signs of stoke and iaction plan in the next 30 days  THN CM Short Term Goal #1 Start Date  07/04/16  [facility] CM Short Term Goal #1 Met Date  07/25/16  Interventions for Short Term Goal #1  reviwed signs of stroke and need to call 911 immediately for symptoms.  THN CM Short Term Goal #2 (0-30 days)  Patient will record daily BP readings and call Md for symptoms in the next 30 days  THN CM Short Term Goal #2 Start Date  07/04/16  [facility] CM Short Term Goal #2 Met Date  07/25/16  Interventions for Short Term Goal #2  Placed call to MD office and reported findings. Reviewed with patient importance of good blood pressure control. Also placed call to [facility] Hypertension clinic in attempt to secure a early appointment due to BP readings.  THN CM Short Term Goal #3 (0-30 days)  Patient will report exercising 5 times per week for the next 30 days.  THN  CM Short Term Goal #3 Start Date  08/14/16  Interventions for Short Term Goal #3  reviewed goals with patient and he knows he needs to exerise and is going to try again.   THN CM Short Term Goal #4 (0-30 days)  Patient reports that he will decrease his bread servings to 4 per day for the next 30 days.   THN CM Short Term Goal #4 Start Date  08/14/16  Interventions for Short Term Goal #4  Reviewed importance of DM control  reviewed importance to call MD for high CBG readings.  Will send this note to MD>       Tomasa Rand, RN, BSN, CEN [place] Coordinator [phone]

## 2016-08-16 ENCOUNTER — Other Ambulatory Visit: Payer: Self-pay

## 2016-08-16 NOTE — Patient Outreach (Signed)
Care Coordination: Wife called and left a message stating endocrinology appointment changed 09/04/2016.   Placed call back to wife who confirms change in appointment. Wife states that she found out patient has not been taking his levemir due to cost.  RX called into for a refill.   Encouraged wife to as for samples from MD. [facility] pharmacy assistance with medication. Wife will consider and let me know.    Wife also reports no appointment with hypertension clinic.    PLAN: Will continue to follow up with patient.  Home visit planned on December 5th.  Tomasa Rand, RN, BSN, CEN [facility] [employer] [phone]

## 2016-08-17 ENCOUNTER — Other Ambulatory Visit: Payer: Self-pay | Admitting: *Deleted

## 2016-08-17 NOTE — Patient Outreach (Signed)
[facility] [facility]) Care Management  08/17/2016  Mark Stewart 10-Sep-1946 [id number]   Received message from [facility], that patient's wife, [name] had called [facility] office regarding concerns with patient. Placed call to Mr.Enterline, spoke with Dorma Russell , she described patient had episode this afternoon , when returning from his walk, he got extremely weak, leaning backward when coming in the doorway, she assisted him to his chair,he complained of feeling woozy headed,  checked his blood pressure 141/63 heart rate 75, checked his blood sugar it was 129, she gave patient food and drink. Patient now resting in chair, speech clear, no drift when holding arms out no changes in facial expression. Instructed Mrs.Chopra to recheck patient blood pressure and blood sugar since he has eaten. She discussed that he took levimir 15 units last night, ate breakfast, had not taken his lunch time novolog.  Discussed with wife MD needs to be notified of this new concerns on today  1500 return call from Mrs.Chaires after rechecking his blood sugar and blood pressure , 153/72, heart rate 63, and blood sugar now 223, she states patient feeling back to usual state, tolerating ambulation in home. Mrs.Burnsworth states maybe his blood sugar just dropped some .   Plan Will notify MD of concerns., placed call to Dr.Dough office received return call from [place] , able to explain patient recent symptoms and update on how he's feeling now, she will discuss with Dr.Dough and if changes or not is will  notify patient/wife . Notified Mrs.Payer.  Joylene Draft, RN, [place] Management [phone]- Mobile [phone]- Toll Free Main Office

## 2016-08-20 ENCOUNTER — Other Ambulatory Visit: Payer: Self-pay

## 2016-08-20 NOTE — Patient Outreach (Signed)
Telephone follow up: Placed call to patient to follow up on "weakness " episodes on 08/17/2016.  Patient reports that he has not had any other problems. Reports blood pressure in his normal range. States CBG today of 140.  Denies any further concern from episode on Friday.   PLAN: will see patient for home visit as planned 09/11/2016  Tomasa Rand, RN, BSN, [place] Coordinator [phone]

## 2016-09-04 DIAGNOSIS — Z794 Long term (current) use of insulin: Secondary | ICD-10-CM | POA: Diagnosis not present

## 2016-09-04 DIAGNOSIS — E1165 Type 2 diabetes mellitus with hyperglycemia: Secondary | ICD-10-CM | POA: Diagnosis not present

## 2016-09-04 DIAGNOSIS — I10 Essential (primary) hypertension: Secondary | ICD-10-CM | POA: Diagnosis not present

## 2016-09-04 DIAGNOSIS — E118 Type 2 diabetes mellitus with unspecified complications: Secondary | ICD-10-CM | POA: Diagnosis not present

## 2016-09-04 DIAGNOSIS — E784 Other hyperlipidemia: Secondary | ICD-10-CM | POA: Diagnosis not present

## 2016-09-11 ENCOUNTER — Other Ambulatory Visit: Payer: Self-pay

## 2016-09-11 NOTE — Patient Outreach (Signed)
[facility] [facility]) Care Management   09/11/2016  Mark Stewart 02-Oct-1946 [id number]  Mark Stewart is an 70 y.o. male  Subjective: Patient reports that he is doing better. States that he went to see diabetes specialist and reports he had some medications changes. Reports that he is being more active and watching his diet. 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 Reports that this is keeping him busy.   Reports todays CBG of 225.  Range in the last month of 83-217.  Reports BP range of 160-213/ 81-108.   Patient reports never got results from BP monitoring and was never set up for follow up.   Objective:  Awake and sleeping. Yawning throughout home visit.  Ambulating without difficulty.  Vitals:   09/11/16 1211  BP: (!) 190/82  Pulse: 63  Resp: 18  SpO2: 98%  Weight: 186 lb (84.4 kg)   Review of Systems  Constitutional: Positive for malaise/fatigue.  HENT: Negative.   Eyes: Negative.   Respiratory: Negative.   Cardiovascular: Negative.   Gastrointestinal: Negative.   Genitourinary: Negative.   Musculoskeletal: Negative.   Skin: Negative.   Neurological: Negative.   Endo/Heme/Allergies: Negative.   Psychiatric/Behavioral: Positive for depression.    Physical Exam  Constitutional: He is oriented to person, place, and time. He appears well-developed and well-nourished.  Cardiovascular: Normal rate, normal heart sounds and intact distal pulses.   Respiratory: Effort normal.  GI: Soft. Bowel sounds are normal.  Musculoskeletal: Normal range of motion.  Neurological: He is alert and oriented to person, place, and time.  Skin: Skin is warm and dry.    Encounter Medications:     Medication List       Accurate as of 09/11/16 12:16 PM. Always use your most recent med list.          Amlodipine-Valsartan-HCTZ 10-160-12.5 MG Tabs Take 0.5 tablets by mouth 2 (two) times daily.   aspirin EC 325 MG  tablet Take 1 tablet (325 mg total) by mouth daily.   atorvastatin 80 MG tablet Commonly known as:  LIPITOR Take 80 mg by mouth daily.   carvedilol 25 MG tablet Commonly known as:  COREG Take 37.5 mg by mouth 2 (two) times daily.   cloNIDine 0.1 MG tablet Commonly known as:  CATAPRES Take 0.1 mg by mouth 2 (two) times daily.   empagliflozin 10 MG Tabs tablet Commonly known as:  JARDIANCE Take 10 mg by mouth daily.   fluticasone 50 MCG/ACT nasal spray Commonly known as:  FLONASE Place 2 sprays into both nostrils daily as needed for allergies or rhinitis. Reported on 04/26/2016   insulin aspart 100 UNIT/ML FlexPen Commonly known as:  NOVOLOG Inject 5-10 Units into the skin 3 (three) times daily with meals. 5 units before breakfast, 10 units before lunch, 5 units before supper   Insulin Detemir 100 UNIT/ML Pen Commonly known as:  LEVEMIR FLEXTOUCH Inject 35 Units into the skin daily.   metFORMIN 1000 MG tablet Commonly known as:  GLUCOPHAGE Take 1,000 mg by mouth 2 (two) times daily with a meal.        Functional Status:   In your present state of health, do you have any difficulty performing the following activities: 06/06/2016 05/02/2016  Hearing? N Y  Vision? N Y  Difficulty concentrating or making decisions? N Y  Walking or climbing stairs? N N  Dressing or bathing? N N  Doing errands, shopping? N  N  Preparing Food and eating ? - N  Using the Toilet? - N  In the past six months, have you accidently leaked urine? - N  Do you have problems with loss of bowel control? - N  Managing your Medications? - N  Managing your Finances? - N  Housekeeping or managing your Housekeeping? - N  Some recent data might be hidden    Fall/Depression Screening:    PHQ 2/9 Scores 06/18/2016 06/06/2016 06/06/2016 05/02/2016 04/30/2016  PHQ - 2 Score 6 4 0 0 1  PHQ- 9 Score 14 13 - - -    Assessment:   (1) reviewed recommendations from endocrinologist. Reviewed patient needed to be  monitoring CBG twice a day.  Hgb A1c results of 9.4 (2) reports cough (3) interest in seeing cardiology about BP.   (4) reports being active ( 30 minutes of walking) 2-3 times per week.  (5) reports following a better diet. Reports decreased bread and eating more vegs.      Plan:  (1) Encouraged patient to take all medications as prescribed.   (2)Patient with questions about cough and if this could be related to BP medications. Will send an in basket message to [facility] pharmacist. (3) Will assist  patient in getting [facility] directory for patient to choose in network cardiologist. Then will assist patient in getting appointment for BP management. (4) Encouraged patient to continue to be active and exercise to decrease CBG, BP and depression. (5) encouraged patient to continue to follow his DM diet.    [facility] CM Care Plan Problem One   Flowsheet Row Most Recent Value  Care Plan Problem One  Knowledge deficit of diabetes related to dietary modifications  Role Documenting the Problem One  Care Management Coordinator  Care Plan for Problem One  Active  THN Long Term Goal (31-90 days)  Patient will be able to create a menu for the week choosing appropriate foods in the next 60 days  THN Long Term Goal Start Date  04/30/16  [facility] Long Term Goal Met Date  07/04/16  Interventions for Problem One Long Term Goal  Reviewed importance of daily meal planning.  THN CM Short Term Goal #1 (0-30 days)  patient will identify 2 foods high in protein within the next 3 weeks  THN CM Short Term Goal #1 Start Date  04/30/16  [facility] CM Short Term Goal #1 Met Date  06/07/16  Interventions for Short Term Goal #1  Educate patient about food types:  simple carbs, proteins, fats  THN CM Short Term Goal #2 (0-30 days)  patient will log all consumed foods in a journal over the next 30 days  THN CM Short Term Goal #2 Start Date  04/30/16  [facility] CM Short Term Goal #2 Met Date  06/07/16  Interventions for Short Term Goal #2  Reviewed  high protein foods and provided EMMI education on DM and diets. THN DM folder provided to patient    [facility] CM Care Plan Problem Two   Flowsheet Row Most Recent Value  Care Plan Problem Two  active  Role Documenting the Problem Two  Care Management [place] for Problem Two  Active  Interventions for Problem Two Long Term Goal   will place call to MD office to make a follow up appointment for labs and office visit.  THN Long Term Goal (31-90) days  Patient will report decrease in a1c in the next 90 days.  THN Long Term Goal Start Date  05/02/16  THN Long Term Goal Met Date  07/04/16  THN CM Short Term Goal #1 (0-30 days)  Patient will record CBG readings and comments in [facility] calendar for the next 30 days  THN CM Short Term Goal #1 Start Date  05/02/16  [facility] CM Short Term Goal #1 Met Date   06/07/16  Interventions for Short Term Goal #2   Reviewed importance of eating a daily bedtime snack.  THN CM Short Term Goal #2 (0-30 days)  Patient will report exercising 10 minutes per day for the next 30 days.  THN CM Short Term Goal #2 Start Date  07/04/16  [facility] CM Short Term Goal #2 Met Date  07/25/16  Interventions for Short Term Goal #2  Reviewed benefits of exercise post stroke and for DM control., Encouraged patient to discuss with physical therapist his need for home exercise plan.    [facility] CM Care Plan Problem Three   Flowsheet Row Most Recent Value  Care Plan Problem Three  Alteration in blood pressure  Role Documenting the Problem Three  Care Management Coordinator  Care Plan for Problem Three  Active  THN Long Term Goal (31-90) days  Patient will report decreased blood pressure in the next 90 days.  THN Long Term Goal Start Date  07/04/16  Interventions for Problem Three Long Term Goal  Reviewed importance of BP monitoring and reporting high or low BP readings to MD.   THN CM Short Term Goal #1 (0-30 days)  Patient will be able to verbalize signs of stoke and iaction plan in the next  30 days  THN CM Short Term Goal #1 Start Date  07/04/16  [facility] CM Short Term Goal #1 Met Date  07/25/16  Interventions for Short Term Goal #1  reviwed signs of stroke and need to call 911 immediately for symptoms.  THN CM Short Term Goal #2 (0-30 days)  Patient will record daily BP readings and call Md for symptoms in the next 30 days  THN CM Short Term Goal #2 Start Date  07/04/16  [facility] CM Short Term Goal #2 Met Date  07/25/16  Interventions for Short Term Goal #2  Placed call to MD office and reported findings. Reviewed with patient importance of good blood pressure control. Also placed call to [facility] Hypertension clinic in attempt to secure a early appointment due to BP readings.  THN CM Short Term Goal #3 (0-30 days)  Patient will report exercising 5 times per week for the next 30 days.  THN  CM Short Term Goal #3 Start Date  09/11/16  Interventions for Short Term Goal #3  reviewed goals with patient and he knows he needs to exerise and is going to try again.   THN CM Short Term Goal #4 (0-30 days)  Patient reports that he will decrease his bread servings to 4 per day for the next 30 days.   THN CM Short Term Goal #4 Start Date  08/14/16  THN CM Short Term Goal #4 Met Date  09/11/16  Interventions for Short Term Goal #4  Reviewed importance of DM control  reviewed importance to call MD for high CBG readings.  Will send this note to MD>       Reviewed with plan with patient and my next home visit planned for 10/09/2016   Will send this note to primary MD for update.  Tomasa Rand, RN, BSN, CEN [facility] [employer] [phone]

## 2016-09-14 ENCOUNTER — Other Ambulatory Visit: Payer: Self-pay | Admitting: Pharmacist

## 2016-09-14 NOTE — Patient Outreach (Signed)
Mark Stewart) Care Management  09/14/2016  Mark Stewart [birth year] [id number]   70year old male referred to Mark Stewart for medication management due to uncontrolled type 2 diabetes and hypertension. Patient last seen by Mark Stewart on 07/25/16 and last seen by Mark Stewart on 09/11/2016.  Called patient today to followup possible cough due to one of his medications which was reported by [facility] RN Mark Stewart.  Was unable to reach patient via telephone and left a HIPAA compliant voicemail.  Plan: Followup via telephone next week  Mark Stewart, PharmD [facility] PGY2 Pharmacy Resident [phone]

## 2016-09-19 ENCOUNTER — Other Ambulatory Visit: Payer: Self-pay | Admitting: Pharmacist

## 2016-09-19 NOTE — Patient Outreach (Signed)
[facility]) Care Management  09/19/2016  Mark Stewart [birth year] [id number]  70 year old .  Patient has a cough at night when he goes to bed at night. He reports the cough is mostly at night and occasionally during the day.  He reports it is sometimes a deep cough.  He denies sneezing, runny nose, or post-nasal drip.  Reports it has been going on for a month or 2.  He states he has not been using his flonase on a regular basis. Patient does report adherence to his other medications  Assessment: Cough of unknown etiology: Cough could be medication induced by Valsartan but patient has been on this for years and this is unlikely. Cough could be caused by postnasal drip or other causes.  Plan: Instructed patient to discuss cough with his primary care physician at his next visit. Instructed patient to try taking his Flonase every day to see if this helps with the cough to prevent postnasal drip.  [name], PharmD [facility] PGY2 Pharmacy Resident [phone]  [facility] CM Care Plan Problem One   Flowsheet Row Most Recent Value  Care Plan Problem One  Knowledge deficit of diabetes management related to insulin administration  Role Documenting the Problem One  Clinical Pharmacist  Care Plan for Problem One  Active  THN CM Short Term Goal #1 (0-30 days)  Patient will administer his Novolog insulin before meals and not within 3 hours of the previous Novolog administration over the next 30 days as measured by patient report  THN CM Short Term Goal #1 Start Date  06/06/16  [facility] CM Short Term Goal #1 Met Date  07/04/16  Interventions for Short Term Goal #1  Counseled patient on signs/symptoms/treatment of hypoglycemia. Instructed him to take insulin before meals not after meals and to not take bolus novolog within 3 hours of giving the prior dose if his meals are within that time period  [facility] CM Short Term Goal #2 (0-30 days)  Patient will continue to check his blood glucose 1-2 times per  day and his blood pressure at least daily over the next 30 days  THN CM Short Term Goal #2 Start Date  07/04/16  Interventions for Short Term Goal #2  Discussed importance of blood pressure and diabetes control to prevent further cardiovascular risk.  Discussed the importance of attending his hypertension specialist visit in the next few weeks.

## 2016-09-20 ENCOUNTER — Other Ambulatory Visit: Payer: Self-pay

## 2016-09-20 NOTE — Patient Outreach (Signed)
Care Coordination: Spoke with patient today about cardiology inquiry.  Update to get patient a printed provider list.    PLAN: encouraged patient to call his customer service number on the back of his card and get assistance with picking an cardiologist. Encouraged patient to call sooner if questions or problems.  Tomasa Rand, RN, BSN, CEN [facility] [employer] [phone]

## 2016-10-09 ENCOUNTER — Other Ambulatory Visit: Payer: Self-pay | Admitting: Pharmacist

## 2016-10-09 ENCOUNTER — Other Ambulatory Visit: Payer: Self-pay

## 2016-10-09 NOTE — Patient Outreach (Signed)
[facility] [facility]) Care Management  [place]   10/09/2016  Mark Stewart 1946/06/02 [id number]  Subjective:  71year old male referred to [place] for medication management due to uncontrolled type 2 diabetes and hypertension. Patient has past medical history of CVA (02/26/16), hypertension, diabetes, hyperlipidemia, and hypogonadism. He was hospitalized in 02/2016 with a acute ischemic stroke.  Today during pharmacist home visit with [facility] RN wife reports patient has not been exercising, sleeping all of the time and is having trouble sleeping at night.  Patient reports he has been using treadmill a couple of times but rarely. Patient reports cough only when laying down but denies cough during the day.  Reports this is a dry cough only when he lays down that is more a of a "sensation" that he needs to cough. Denies shortness of breath.  Has follow up with Endocrinologist and Dr [name] in March 2018.   Reports adherence to his medications.  Denies signs/sypmtoms of hypoglycemia.  Denies pain/burning upon urination.  Does report some episodes of dizziness but this is rare for him.   Patient is still trying to get a list of cardiologists in the [employer].    Currently checking CBGs mostly once daily.   CBG today (fasting): 163 mg/dL CBG range (fasting): 85-234 mg/dL.  Mostly 150-200 mg/dL CBG after dinner (only 3 readings from 12/18 - 12/20): 257, 518, 298 mg/dL   Home Blood Pressure over last 5 days: 172/86, 155/83, 162/111, 172/82, 176/80 Heart Rate: 73, 63, 73, 68, 61   Objective:  POCT glycosylated hemoglobin (Hb A1C) (09/04/2016 2:22 PM) POCT glycosylated hemoglobin (Hb A1C) (09/04/2016 2:22 PM)  Component Value Ref Range  HEMOGLOBIN A1C, POC 9.4 (A) 4.1 - 6.4%   Blood Pressure 152/78, 138/72 Heart Rate: 66    Encounter Medications: Outpatient Encounter Prescriptions as of 10/09/2016  Medication Sig  . Amlodipine-Valsartan-HCTZ  10-160-12.5 MG TABS Take 0.5 tablets by mouth 2 (two) times daily.   Marland Kitchen aspirin EC 325 MG tablet Take 1 tablet (325 mg total) by mouth daily.  Marland Kitchen atorvastatin (LIPITOR) 80 MG tablet Take 80 mg by mouth daily.  . carvedilol (COREG) 25 MG tablet Take 37.5 mg by mouth 2 (two) times daily.   . Cinnamon 500 MG TABS Take 1,000 mg by mouth daily.  . cloNIDine (CATAPRES) 0.1 MG tablet Take 0.1 mg by mouth 2 (two) times daily.   . empagliflozin (JARDIANCE) 10 MG TABS tablet Take 10 mg by mouth daily.  . fluticasone (FLONASE) 50 MCG/ACT nasal spray Place 2 sprays into both nostrils daily as needed for allergies or rhinitis. Reported on 04/26/2016  . insulin detemir (LEVEMIR) 100 UNIT/ML injection Inject 15 Units into the skin at bedtime.  . metFORMIN (GLUCOPHAGE) 1000 MG tablet Take 1,000 mg by mouth 2 (two) times daily with a meal.  . [DISCONTINUED] Insulin Detemir (LEVEMIR FLEXTOUCH) 100 UNIT/ML Pen Inject 35 Units into the skin daily. (Patient taking differently: Inject 15 Units into the skin daily. 15 units at night)  . [DISCONTINUED] insulin aspart (NOVOLOG) 100 UNIT/ML FlexPen Inject 5-10 Units into the skin 3 (three) times daily with meals. 5 units before breakfast, 10 units before lunch, 5 units before supper   No facility-administered encounter medications on file as of 10/09/2016.     Functional Status: In your present state of health, do you have any difficulty performing the following activities: 10/09/2016 06/06/2016  Hearing? - N  Vision? - N  Difficulty concentrating or making decisions? -  N  Walking or climbing stairs? - N  Dressing or bathing? - N  Doing errands, shopping? - N  Conservation officer, nature and eating ? N -  Using the Toilet? N -  In the past six months, have you accidently leaked urine? N -  Do you have problems with loss of bowel control? N -  Managing your Medications? N -  Managing your Finances? N -  Housekeeping or managing your Housekeeping? N -  Some recent data might be  hidden    Fall/Depression Screening: PHQ 2/9 Scores 06/18/2016 06/06/2016 06/06/2016 05/02/2016 04/30/2016  PHQ - 2 Score 6 4 0 0 1  PHQ- 9 Score 14 13 - - -    Assessment: Diabetes: Currently uncontrolled with A1C of 9.4% without signs/symptoms of hypoglycemia.  Patients Novolog was recently discontinued and replaced with Jardiance by Endocrinology.   Hypertension: Above goal of <140/90 on clonidine 0.1 mg twice daily, carvedilol 37.5 mg twice daily and amlodipine/valsartan/HCTZ 10/160/12.5 mg 1/2 tablet twice daily.    Plan: Will contact Dr [name] to consider increasing amlodipine/valsartan/HCTZ 10/160/12.5 mg to amlodipine/valsartan/HCTZ 10/320/12.5 mg daily Patient will call to obtain cardiology appointment.   Patient has set goal to increase exercise on treadmill 15 minutes/day for 3 days per week and to get on better sleeping schedule Patient will continue to check his blood pressure and CBGs and write the values down.  Followup home visit in 1 month with [facility] RN   [name], PharmD, [place] PGY2 Pharmacy Resident [phone]  [facility] CM Care Plan Problem One   Flowsheet Row Most Recent Value  Care Plan Problem One  Knowledge deficit of diabetes management related to diabetes control  Role Documenting the Problem One  Clinical Pharmacist  Care Plan for Problem One  Active  THN CM Short Term Goal #1 (0-30 days)  Patient will administer his Novolog insulin before meals and not within 3 hours of the previous Novolog administration over the next 30 days as measured by patient report  THN CM Short Term Goal #1 Start Date  06/06/16  [facility] CM Short Term Goal #1 Met Date  07/04/16  Interventions for Short Term Goal #1  Counseled patient on signs/symptoms/treatment of hypoglycemia. Instructed him to take insulin before meals not after meals and to not take bolus novolog within 3 hours of giving the prior dose if his meals are within that time period  [facility] CM Short Term Goal #2 (0-30 days)   Patient will continue to check his blood glucose 1-2 times per day and his blood pressure at least daily over the next 30 days  THN CM Short Term Goal #2 Start Date  07/04/16  Interventions for Short Term Goal #2  Discussed importance of checking his CBGs more often so his providers will know if his diabetes is controlled throughout the day    [facility] CM Care Plan Problem Two   Flowsheet Row Most Recent Value  Care Plan Problem Two  Lack of motivation for lifestyle management related to chronic disease states  Role Documenting the Problem Two  Clinical Pharmacist  Care Plan for Problem Two  Active  THN CM Short Term Goal #1 (0-30 days)  Patient will exercise on treadmill 15 minutes per day for 3 days per week over the next 30 days as measured by patient reprot  THN CM Short Term Goal #1 Start Date  10/09/16  Interventions for Short Term Goal #2   Discussed importance of exercise in controlling diabetes and hypertension and  reducing cardiovascular risk  THN CM Short Term Goal #2 (0-30 days)  Patient will start going to bed and waking up at scheduled times over the next 30 days as measured by patient report  THN CM Short Term Goal #2 Start Date  10/09/16  Interventions for Short Term Goal #2  Discussed importance of scheduled sleep in maintaining a healthy sleep schedule and not being drowsy and tired throughout the day

## 2016-10-09 NOTE — Patient Outreach (Signed)
**Note Mark-Identified via Obfuscation** Mark Stewart) Care Management   10/09/2016  Mark Stewart Sep 30, 1946 [id number]  Mark Stewart is an 71 y.o. male Joint visit with Mark Stewart, Mark Stewart Pharmacist Subjective: Wife reports that patient is sleeping more during the day and not sleeping well at night. Patient declines exercising. States not very motivated. Todays Cbg of 163.  Range 85- 234.  Mostly running 150-200.   BP log with 4 readings.   155-172/82-111.  Reports that he continues to cough more when he lays down. Patient reports follow up planned with Dr. [name] in March.  Denies any hypoglycemia. Reports good appetite.  Patient reports that he is still interested in seeking a cardiologist.   Objective: Awake  and alert.  Ambulating slowly.  Vitals:   10/09/16 1115 10/09/16 1118  BP: 138/72 (!) 152/78  Pulse: 66   Resp:  18  SpO2: 97%   Weight: 185 lb (83.9 kg)      Review of Systems  Constitutional: Positive for malaise/fatigue.  HENT:       Reports frequent nose bleeds/ daily  Eyes: Negative.   Respiratory: Positive for cough.   Cardiovascular: Negative.   Gastrointestinal: Negative.   Genitourinary: Positive for frequency.  Musculoskeletal: Negative.   Skin: Negative.   Neurological: Positive for dizziness.       On and off dizzy spells. Reports slow "walking"   Endo/Heme/Allergies: Bruises/bleeds easily.  Psychiatric/Behavioral: Positive for depression.    Physical Exam  Constitutional: He is oriented to person, place, and time. He appears well-developed and well-nourished.  Cardiovascular: Normal rate, normal heart sounds and intact distal pulses.   Respiratory: Effort normal and breath sounds normal.  GI: Soft. Bowel sounds are normal.  Musculoskeletal: Normal range of motion. He exhibits no edema.  Neurological: He is alert and oriented to person, place, and time.  Skin: Skin is warm and dry.  Feet without lesions.  Psychiatric: He has a normal mood and affect. His behavior is  normal. Judgment and thought content normal.    Encounter Medications:   Outpatient Encounter Prescriptions as of 10/09/2016  Medication Sig  . Amlodipine-Valsartan-HCTZ 10-160-12.5 MG TABS Take 0.5 tablets by mouth 2 (two) times daily.   Marland Kitchen aspirin EC 325 MG tablet Take 1 tablet (325 mg total) by mouth daily.  Marland Kitchen atorvastatin (LIPITOR) 80 MG tablet Take 80 mg by mouth daily.  . carvedilol (COREG) 25 MG tablet Take 37.5 mg by mouth 2 (two) times daily.   . cloNIDine (CATAPRES) 0.1 MG tablet Take 0.1 mg by mouth 2 (two) times daily.   . empagliflozin (JARDIANCE) 10 MG TABS tablet Take 10 mg by mouth daily.  . fluticasone (FLONASE) 50 MCG/ACT nasal spray Place 2 sprays into both nostrils daily as needed for allergies or rhinitis. Reported on 04/26/2016  . Insulin Detemir (LEVEMIR FLEXTOUCH) 100 UNIT/ML Pen Inject 35 Units into the skin daily. (Patient taking differently: Inject 15 Units into the skin daily. 15 units at night)  . metFORMIN (GLUCOPHAGE) 1000 MG tablet Take 1,000 mg by mouth 2 (two) times daily with a meal.   No facility-administered encounter medications on file as of 10/09/2016.     Functional Status:   In your present state of health, do you have any difficulty performing the following activities: 06/06/2016 05/02/2016  Hearing? N Y  Vision? N Y  Difficulty concentrating or making decisions? N Y  Walking or climbing stairs? N N  Dressing or bathing? N N  Doing errands, shopping? N [employer]  and eating ? - N  Using the Toilet? - N  In the past six months, have you accidently leaked urine? - N  Do you have problems with loss of bowel control? - N  Managing your Medications? - N  Managing your Finances? - N  Housekeeping or managing your Housekeeping? - N  Some recent data might be hidden    Fall/Depression Screening:    PHQ 2/9 Scores 06/18/2016 06/06/2016 06/06/2016 05/02/2016 04/30/2016  PHQ - 2 Score 6 4 0 0 1  PHQ- 9 Score 14 13 - - -   NOTE: meds reviewed by Mark Stewart during home visit. Assessment:   (1) Not exercising. (2) Monitoring CBG once daily for fasting readings. (3) only 4 recent BP recording. (4) reports sleeping anytime he sits still. Reports sleeping during the day and difficulty sleeping at night. (5) reviewed interested in cardiology referral.   Plan:  (1) reviewed goals with patient and patient feels he can get on treadmill for 15 minutes 3 times per week.  States he will record on his calendar. (2) Pharmacist suggest fasting and non fasting CNG readings to determine effectiveness of insulin. (3) encouraged patient to record BP readings daily and report abnormal readings to MD. (4) Encouraged patient to establish a sleep/awake schedule and stick to it for atleast 1 month to see if his helps him feels better, be more motivated to get task accomplished.  Will reassess next month at home visit. (5) wife states that she will call and make patient an appointment for cardiology. States no results from [facility] for BP monitoring or MD visit for BP control.   Next home visit planned on 11/14/2015 at 10:30 am  [facility] CM Care Plan Problem One   Flowsheet Row Most Recent Value  Care Plan Problem One  Knowledge deficit of diabetes related to dietary modifications  Role Documenting the Problem One  Care Management Coordinator  Care Plan for Problem One  Active  THN Long Term Goal (31-90 days)  Patient will be able to create a menu for the week choosing appropriate foods in the next 60 days  THN Long Term Goal Start Date  04/30/16  [facility] Long Term Goal Met Date  07/04/16  Interventions for Problem One Long Term Goal  Reviewed importance of daily meal planning.  THN CM Short Term Goal #1 (0-30 days)  patient will identify 2 foods high in protein within the next 3 weeks  THN CM Short Term Goal #1 Start Date  04/30/16  [facility] CM Short Term Goal #1 Met Date  06/07/16  Interventions for Short Term Goal #1  Educate patient about food types:  simple  carbs, proteins, fats  THN CM Short Term Goal #2 (0-30 days)  patient will log all consumed foods in a journal over the next 30 days  THN CM Short Term Goal #2 Start Date  04/30/16  [facility] CM Short Term Goal #2 Met Date  06/07/16  Interventions for Short Term Goal #2  Reviewed high protein foods and provided EMMI education on DM and diets. THN DM folder provided to patient    [facility] CM Care Plan Problem Two   Flowsheet Row Most Recent Value  Care Plan Problem Two  active  Role Documenting the Problem Two  Care Management [place] for Problem Two  Active  Interventions for Problem Two Long Term Goal   will place call to MD office to make a follow up appointment for labs and  office visit.  THN Long Term Goal (31-90) days  Patient will report decrease in a1c in the next 90 days.  THN Long Term Goal Start Date  05/02/16  THN Long Term Goal Met Date  07/04/16  THN CM Short Term Goal #1 (0-30 days)  Patient will record CBG readings and comments in [facility] calendar for the next 30 days  THN CM Short Term Goal #1 Start Date  05/02/16  [facility] CM Short Term Goal #1 Met Date   06/07/16  Interventions for Short Term Goal #2   Reviewed importance of eating a daily bedtime snack.  THN CM Short Term Goal #2 (0-30 days)  Patient will report exercising 10 minutes per day for the next 30 days.  THN CM Short Term Goal #2 Start Date  07/04/16  [facility] CM Short Term Goal #2 Met Date  07/25/16  Interventions for Short Term Goal #2  Reviewed benefits of exercise post stroke and for DM control., Encouraged patient to discuss with physical therapist his need for home exercise plan.    [facility] CM Care Plan Problem Three   Flowsheet Row Most Recent Value  Care Plan Problem Three  Alteration in blood pressure  Role Documenting the Problem Three  Care Management Coordinator  Care Plan for Problem Three  Active  THN Long Term Goal (31-90) days  Patient will report decreased blood pressure in the next 90 days.  THN Long  Term Goal Start Date  07/04/16  Interventions for Problem Three Long Term Goal  encouraged patient to monitor BP daily and record in [facility] calendar. Encouraged patient to make an appointment for cardiology.  THN CM Short Term Goal #1 (0-30 days)  Patient will report exercising for 15 minutes three times per week for the next 30 days using the treadmill.  THN CM Short Term Goal #1 Start Date  10/09/16  Interventions for Short Term Goal #1  reviewed benefits of exercise for BP and DM control.  THN CM Short Term Goal #2 (0-30 days)  Patient will create and follow a daily schedule of awakening time and sleep time for the next 30 days.  THN CM Short Term Goal #2 Start Date  10/09/16  Interventions for Short Term Goal #2  Reviewed the importance of following a daily sleep and awake scheduled as well as an daily schdule to get activites completed.  Encouraged wife to assist patient with success.  THN CM Short Term Goal #3 (0-30 days)  Patient will report exercising 5 times per week for the next 30 days.  THN  CM Short Term Goal #3 Start Date  09/11/16  Interventions for Short Term Goal #3  reviewed goals with patient and he knows he needs to exerise and is going to try again.   THN CM Short Term Goal #4 (0-30 days)  Patient reports that he will decrease his bread servings to 4 per day for the next 30 days.   THN CM Short Term Goal #4 Start Date  08/14/16  THN CM Short Term Goal #4 Met Date  09/11/16  Interventions for Short Term Goal #4  Reviewed importance of DM control  reviewed importance to call MD for high CBG readings.  Will send this note to MD>      Mark Rand, RN, BSN, CEN [place] Coordinator [phone]

## 2016-10-12 ENCOUNTER — Other Ambulatory Visit: Payer: Self-pay

## 2016-10-12 NOTE — Patient Outreach (Signed)
Care Coordination: Patient called to state that he has a cardiology appointment on Monday January 8th.   PLAN: encouraged patient to take his past and current BP logs with him to his appointment.  Tomasa Rand, RN, BSN, CEN [facility] [employer] [phone]

## 2016-10-14 NOTE — Progress Notes (Signed)
Cardiology Office Note    Date:  10/15/2016   ID:  VAN GAMARRA, DOB October 22, 1945, MRN [id number]  PCP:  Teressa Lower, MD  Cardiologist:  New to [facility] - Dr. Sallyanne Kuster  Chief Complaint  Patient presents with  . New Patient (Initial Visit)    HTN    History of Present Illness:    Mark Stewart is a 71 y.o. male with past medical history of IDDM, HTN, HLD, and prior CVA (02/2016) who presents to the office today as a new patient referral for evaluation of uncontrolled HTN.   He reports ever since being diagnosed with his CVA in 02/2016, his blood pressure readings have remained elevated. Was on Amlodipine-Valsartan-HCTZ 10-160-12.5 daily prior to his CVA with Coreg 37.5mg  BID and Clonidine 0.1mg  BID being added since. Reports SBP readings were initially in the 180's to low-200's but have since improved with SBP readings in the 160's - 170's.   He is followed by [facility] with frequent RN and Pharmacy visits. He brings in today a recording of recent BP readings for the past month. Systolic readings are mostly in the 160's - Q000111Q with diastolic's in the 99991111 to low-100's. The lowest reading he has obtained is 143/90.    BP was checked by his Neurologist in 07/2016 and 131/78 at that time. Reports his BP is generally "improved" when at the doctor's office. He has brought in his home cuff to be checked at prior visits and says the values have matched.  Denies any symptoms when his BP is elevated. No dizziness, lightheadedness, presyncope, or headaches. Denies any recent chest discomfort, palpitations, or dyspnea with exertion. His wife who is present today reports he has been more fatigued since his CVA in 2017.  He denies any prior cardiac history. No known MI's or known arrhythmias. Reports a family history of cancer but no known cardiac disease. Smoked for 20 years but quit in the 1960's. No alcohol use.    Past Medical History:  Diagnosis Date  . Allergy   . Cataract    history of surgery  bilaterally  . Diabetes mellitus without complication ([place])   . Hypercholesteremia   . Hypertension   . 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 Stroke [facility]) 02/2016    Past Surgical History:  Procedure Laterality Date  . EYE SURGERY    . PROSTATE SURGERY    . SPLENECTOMY      Current Medications: Outpatient Medications Prior to Visit  Medication Sig Dispense Refill  . aspirin EC 325 MG tablet Take 1 tablet (325 mg total) by mouth daily. 30 tablet 0  . atorvastatin (LIPITOR) 80 MG tablet Take 80 mg by mouth daily.    . carvedilol (COREG) 25 MG tablet Take 37.5 mg by mouth 2 (two) times daily.   11  . Cinnamon 500 MG TABS Take 1,000 mg by mouth daily.    . cloNIDine (CATAPRES) 0.1 MG tablet Take 0.1 mg by mouth 2 (two) times daily.   5  . empagliflozin (JARDIANCE) 10 MG TABS tablet Take 10 mg by mouth daily.    . fluticasone (FLONASE) 50 MCG/ACT nasal spray Place 2 sprays into both nostrils daily as needed for allergies or rhinitis. Reported on 04/26/2016    . insulin detemir (LEVEMIR) 100 UNIT/ML injection Inject 15 Units into the skin at bedtime.    . metFORMIN (GLUCOPHAGE) 1000 MG tablet Take 1,000 mg by mouth 2 (two) times daily with  a meal.    . Amlodipine-Valsartan-HCTZ 10-160-12.5 MG TABS Take 0.5 tablets by mouth 2 (two) times daily.   11   No facility-administered medications prior to visit.      Allergies:   Benazepril and Penicillins   Social History   Social History  . Marital status: Married    Spouse name: N/A  . Number of children: N/A  . Years of education: N/A   Social History Main Topics  . Smoking status: Former Smoker    Packs/day: 1.00    Years: 20.00  . Smokeless tobacco: Never Used     Comment: patient states last cigarette 35 years ago  . Alcohol use No  . Drug use: No  . Sexual activity: Not Asked   Other Topics Concern  . None   Social History Narrative  . None     Family History:  The patient's family  history includes Breast cancer in his mother and sister; Diabetes Mellitus II in his father.   Review of Systems:   Please see the history of present illness.     General:  No chills, fever, night sweats or weight changes. Positive for fatigue.  Cardiovascular:  No chest pain, dyspnea on exertion, edema, orthopnea, palpitations, paroxysmal nocturnal dyspnea. Dermatological: No rash, lesions/masses Respiratory: No cough, dyspnea Urologic: No hematuria, dysuria Abdominal:   No nausea, vomiting, diarrhea, bright red blood per rectum, melena, or hematemesis Neurologic:  No visual changes, wkns, changes in mental status. All other systems reviewed and are otherwise negative except as noted above.   Physical Exam:    VS:  BP 136/73 (BP Location: Left Arm)   Pulse 67   Ht 6' (1.829 m)   Wt 184 lb (83.5 kg)   SpO2 99%   BMI 24.95 kg/m    General: Well developed, well nourished Caucasian male appearing in no acute distress. Head: Normocephalic, atraumatic, sclera non-icteric, no xanthomas, nares are without discharge.  Neck: No carotid bruits. JVD not elevated.  Lungs: Respirations regular and unlabored, without wheezes or rales.  Heart: Regular rate and rhythm. No S3 or S4.  No murmur, no rubs, or gallops appreciated. Abdomen: Soft, non-tender, non-distended with normoactive bowel sounds. No hepatomegaly. No rebound/guarding. No obvious abdominal masses. Msk:  Strength and tone appear normal for age. No joint deformities or effusions. Extremities: No clubbing or cyanosis. No edema.  Distal pedal pulses are 2+ bilaterally. Neuro: Alert and oriented X 3. Moves all extremities spontaneously. No focal deficits noted. Psych:  Responds to questions appropriately with a normal affect. Skin: No rashes or lesions noted  Wt Readings from Last 3 Encounters:  10/15/16 184 lb (83.5 kg)  10/09/16 185 lb (83.9 kg)  09/11/16 186 lb (84.4 kg)    Studies/Labs Reviewed:   EKG:  EKG is ordered today.  The EKG ordered today demonstrates NSR, HR 66, with no acute ST or T-wave changes.   Recent Labs: 02/27/2016: ALT 16; BUN 8; Creatinine, Ser 0.71; Hemoglobin 14.2; Platelets 248; Potassium 3.7; Sodium 137   Lipid Panel    Component Value Date/Time   CHOL 121 02/27/2016 0536   TRIG 82 02/27/2016 0536   HDL 34 (L) 02/27/2016 0536   CHOLHDL 3.6 02/27/2016 0536   VLDL 16 02/27/2016 0536   LDLCALC 71 02/27/2016 0536    Additional studies/ records that were reviewed today include:   Echocardiogram: 02/2016 Study Conclusions  - Left ventricle: The cavity size was mildly dilated. Wall   thickness was normal. Systolic function was normal.  The estimated   ejection fraction was in the range of 55% to 60%. Doppler   parameters are consistent with abnormal left ventricular   relaxation (grade 1 diastolic dysfunction). - Mitral valve: Calcified annulus. Mildly thickened leaflets .  Assessment:    1. Essential hypertension   2. Mixed hyperlipidemia   3. Type 2 diabetes mellitus with hyperglycemia, with long-term current use of insulin (HCC)   4. Cerebral infarction due to cerebral artery occlusion (HCC)      Plan:   In order of problems listed above:  1. Essential HTN - he has been experiencing issues with BP control since his CVA in 02/2016. His sheet of recent readings over the past month shows SBP readings in the 160's - Q000111Q with diastolic readings in the 99991111 to low-100's. BP 136/73 during today's visit (has checked his home cuff with office readings). Denies any symptoms when BP is elevated. - recent echo from 02/2016 showed a preserved EF of 55-60% with Grade 1 DD.  - this patient was discussed with Dr. Sallyanne Kuster (DOD) as he is a new patient with our practice. With his elevated BP readings will increase his Amlodipine-Valsartan-HCTZ from 10-160-12.5 daily to 10-320-25mg  daily. Reports labs were recently checked by PCP with no significant abnormalities noted. Continue with Coreg  37.5mg  BID and Clonidine 0.1mg  BID. If able, would eventually like to stop Clonidine if BP remains well-controlled with new regimen due to the side effects of this medication. He will continue to record readings at home and notify us of the results.   2. HLD - Lipid Panel in 02/2016 showed total cholesterol 121, HDL 34, and LDL 71. - continue Atorvastatin.   3. Type 2 DM - A1c elevated to 10.7 in 02/2016. - remains on Levemir and recently started on Empagliflozin. Followed by PCP.   4. Prior CVA - occurring in 02/2016.  - followed by Neurology   Medication Adjustments/Labs and Tests Ordered: Current medicines are reviewed at length with the patient today.  Concerns regarding medicines are outlined above.  Medication changes, Labs and Tests ordered today are listed in the Patient Instructions below. Patient Instructions  Medication Instructions:  Your physician has recommended you make the following change in your medication:  Increase amlodipine-valsartan-hctz to 10/320/25 mg half tablet by mouth twice daily.   Labwork: none  Testing/Procedures: none  Follow-Up: Your physician wants you to follow-up in: 12 months with Dr. Sallyanne Kuster or sooner if BP remains elevated. You will receive a reminder letter in the mail two months in advance. If you don't receive a letter, please call our office to schedule the follow-up appointment.  Any Other Special Instructions Will Be Listed Below (If Applicable).  Please call our office if blood pressure is running above [id number] systolic (top number) in about 3-4 weeks.   If you need a refill on your cardiac medications before your next appointment, please call your pharmacy.   Signed, Erma Heritage, [place]  10/15/2016 2:31 PM    [facility] [place], [place], [place]  [address] Phone: [phone]; Fax: [phone]  [address] [place], [place] [address] Phone: [phone]

## 2016-10-15 ENCOUNTER — Encounter: Payer: Self-pay | Admitting: Student

## 2016-10-15 ENCOUNTER — Ambulatory Visit (INDEPENDENT_AMBULATORY_CARE_PROVIDER_SITE_OTHER): Payer: PPO | Admitting: Student

## 2016-10-15 VITALS — BP 136/73 | HR 67 | Ht 72.0 in | Wt 184.0 lb

## 2016-10-15 DIAGNOSIS — E782 Mixed hyperlipidemia: Secondary | ICD-10-CM | POA: Diagnosis not present

## 2016-10-15 DIAGNOSIS — I10 Essential (primary) hypertension: Secondary | ICD-10-CM | POA: Diagnosis not present

## 2016-10-15 DIAGNOSIS — E1165 Type 2 diabetes mellitus with hyperglycemia: Secondary | ICD-10-CM

## 2016-10-15 DIAGNOSIS — Z794 Long term (current) use of insulin: Secondary | ICD-10-CM

## 2016-10-15 DIAGNOSIS — I6350 Cerebral infarction due to unspecified occlusion or stenosis of unspecified cerebral artery: Secondary | ICD-10-CM | POA: Diagnosis not present

## 2016-10-15 MED ORDER — AMLODIPINE-VALSARTAN-HCTZ 10-320-25 MG PO TABS: 0.5000 | ORAL_TABLET | Freq: Two times a day (BID) | ORAL | 6 refills | Status: DC

## 2016-10-15 NOTE — Patient Instructions (Signed)
Medication Instructions:  Your physician has recommended you make the following change in your medication:  Increase amlodipine-valsartan-hctz to 10/320/25 mg half tablet by mouth twice daily.    Labwork: none  Testing/Procedures: none  Follow-Up: Your physician wants you to follow-up in: 12 months with Dr. Sallyanne Kuster. You will receive a reminder letter in the mail two months in advance. If you don't receive a letter, please call our office to schedule the follow-up appointment.   Any Other Special Instructions Will Be Listed Below (If Applicable).  Please call our office if blood pressure is running above [id number] systolic (top number) in about 3-4 weeks.    If you need a refill on your cardiac medications before your next appointment, please call your pharmacy.

## 2016-10-24 ENCOUNTER — Other Ambulatory Visit: Payer: Self-pay

## 2016-10-24 NOTE — Patient Outreach (Signed)
Telephone assessment on hypertension:  Placed call to patient to follow up on cardiology office visit. Patient reports office visit went well.  States that his medication was increased to Amlodipine/valsartan/HCTZ 10/320/25mg   Patient is take a half tablet twice a day.  BP readings are:  10/24/2016      151/91                               10/23/2016     151/81                               10/22/2016    167/84                               10/21/2016     194/80                               10/20/2016     196/89                               10/19/2016     177/88  Patient reports that he is exercising now too.   PLAN: next home visit planned for 11/13/2016.  Encouraged patient to continue to monitor and record BP readings daily.  Will send in basket message to cardiology and route this note to the office.   Tomasa Rand, RN, BSN, CEN [facility] [employer] [phone]

## 2016-10-29 NOTE — Progress Notes (Signed)
I guess that there is a trend of improvement. It seems he has not kept a log of heart rates. If he is not bradycardic (i.e. HR>60) I would recommend increasing the carvedilol to 50 mg BID. It would be great if we could achieve BP control and get him off clonidine, but that may not work. Sanda Klein, MD

## 2016-11-13 ENCOUNTER — Ambulatory Visit: Payer: Self-pay | Admitting: Pharmacist

## 2016-11-13 ENCOUNTER — Other Ambulatory Visit: Payer: Self-pay

## 2016-11-13 ENCOUNTER — Other Ambulatory Visit: Payer: Self-pay | Admitting: Pharmacist

## 2016-11-13 ENCOUNTER — Ambulatory Visit: Payer: Self-pay

## 2016-11-13 NOTE — Patient Outreach (Signed)
[facility]) Care Management   11/13/2016  Mark Stewart 20-Mar-1946 [id number]  Mark Stewart is an 71 y.o. male  Subjective:  Patient reports that he is feeling well.  BP log with decreased BP readings.  Home readings as below: 11/13/2016    154/82   HR   68   CBG   202 11/12/2016 153/91      HR  71    CBG 168   CBG 350 11/11/2016     172/87     HR  66     CBG 233 11/07/2016   132/74      HR  64    CBG  167 11/02/2016    153/72     HR  70     CBG   94 11/01/2016    131/73      HR   65    CBG   222 10/31/2016    160/76    HR   62        CBG   164 10/30/2016   168/81     HR 62          CBG  225 10/29/2016   173/95    HR 69         CBG   149 10/28/2016      148/87    HR 65       CBG  157   Patient reports that he continues to sleep later than normal.  Gets up around 9 am.  Very little home exercise.  Reports being active working in [employer].   Reports no depression.  Patient reports that he feels everything is going well.  Patient reports that he continues to have problems getting task completed.  ( taxes from 2016)   Objective:  Awake and alert. Ambulating without difficulty.  Vitals:   11/13/16 1441  BP: 134/68  Pulse: 67  Resp: 18  SpO2: 98%  Weight: 184 lb (83.5 kg)   Review of Systems  Constitutional: Negative.   HENT: Negative.   Eyes: Negative.   Respiratory: Negative.   Cardiovascular: Negative.   Gastrointestinal: Negative.   Genitourinary: Negative.   Musculoskeletal: Negative.   Skin: Negative.   Neurological: Negative.   Endo/Heme/Allergies: Negative.   Psychiatric/Behavioral: Negative for depression.    Physical Exam  Constitutional: He is oriented to person, place, and time. He appears well-developed and well-nourished.  Cardiovascular: Normal rate, normal heart sounds and intact distal pulses.   Respiratory: Effort normal and breath sounds normal.  GI: Soft. Bowel sounds are normal.  Musculoskeletal: He exhibits edema.  1 plus edema to  lower legs  Neurological: He is alert and oriented to person, place, and time.  Skin: Skin is warm and dry.  Feet without lesions or open skin  Psychiatric: He has a normal mood and affect. His behavior is normal. Judgment and thought content normal.    Encounter Medications:   Outpatient Encounter Prescriptions as of 11/13/2016  Medication Sig  . Amlodipine-Valsartan-HCTZ 10-320-25 MG TABS Take 0.5 tablets by mouth 2 (two) times daily.  Marland Kitchen aspirin EC 325 MG tablet Take 1 tablet (325 mg total) by mouth daily.  Marland Kitchen atorvastatin (LIPITOR) 80 MG tablet Take 80 mg by mouth daily.  . carvedilol (COREG) 25 MG tablet Take 37.5 mg by mouth 2 (two) times daily.   . Cinnamon 500 MG TABS Take 1,000 mg by mouth daily.  . cloNIDine (CATAPRES) 0.1 MG tablet Take 0.1  mg by mouth 2 (two) times daily.   . empagliflozin (JARDIANCE) 10 MG TABS tablet Take 10 mg by mouth daily.  . fluticasone (FLONASE) 50 MCG/ACT nasal spray Place 2 sprays into both nostrils daily as needed for allergies or rhinitis. Reported on 04/26/2016  . insulin detemir (LEVEMIR) 100 UNIT/ML injection Inject 15 Units into the skin at bedtime.  . metFORMIN (GLUCOPHAGE) 1000 MG tablet Take 1,000 mg by mouth 2 (two) times daily with a meal.   No facility-administered encounter medications on file as of 11/13/2016.    Medications list reviewed by [facility] pharmacist. Joseph Berkshire Functional Status:   In your present state of health, do you have any difficulty performing the following activities: 10/09/2016 06/06/2016  Hearing? - N  Vision? - N  Difficulty concentrating or making decisions? - N  Walking or climbing stairs? - N  Dressing or bathing? - N  Doing errands, shopping? - N  Conservation officer, nature and eating ? N -  Using the Toilet? N -  In the past six months, have you accidently leaked urine? N -  Do you have problems with loss of bowel control? N -  Managing your Medications? N -  Managing your Finances? N -  Housekeeping or managing your  Housekeeping? N -  Some recent data might be hidden    Fall/Depression Screening:    PHQ 2/9 Scores 06/18/2016 06/06/2016 06/06/2016 05/02/2016 04/30/2016  PHQ - 2 Score 6 4 0 0 1  PHQ- 9 Score 14 13 - - -    Assessment:   (1) improved BP readings.    1 plus edema in the lower legs. (2) feels good. (3) CBG readings  ( average of 150) (4) no home exercise program.    Plan:  (1) will send cardiology BP readings. Encouraged low salt diet.  BP readings to be sent to cardiology from [place] with recomendations.  (2) encouraged patient to remain active. (3) reviewed importance of checking CBG after meals and record readings. (4)  Reviewed importance of continuing to exercise.   Will see patient for another home visit on 12/18/2016. Encouraged patient to call sooner if needed.   [facility] CM Care Plan Problem One   Flowsheet Row Most Recent Value  Care Plan Problem One  Knowledge deficit of diabetes related to dietary modifications  Role Documenting the Problem One  Care Management Coordinator  Care Plan for Problem One  Active  THN Long Term Goal (31-90 days)  Patient will be able to create a menu for the week choosing appropriate foods in the next 60 days  THN Long Term Goal Start Date  04/30/16  [facility] Long Term Goal Met Date  07/04/16  Interventions for Problem One Long Term Goal  Reviewed importance of daily meal planning.  THN CM Short Term Goal #1 (0-30 days)  patient will identify 2 foods high in protein within the next 3 weeks  THN CM Short Term Goal #1 Start Date  04/30/16  [facility] CM Short Term Goal #1 Met Date  06/07/16  Interventions for Short Term Goal #1  Educate patient about food types:  simple carbs, proteins, fats  THN CM Short Term Goal #2 (0-30 days)  patient will log all consumed foods in a journal over the next 30 days  THN CM Short Term Goal #2 Start Date  04/30/16  [facility] CM Short Term Goal #2 Met Date  06/07/16  Interventions for Short Term Goal #2  Reviewed high protein  foods and provided  EMMI education on DM and diets. THN DM folder provided to patient    [facility] CM Care Plan Problem Two   Flowsheet Row Most Recent Value  Care Plan Problem Two  active  Role Documenting the Problem Two  Care Management [place] for Problem Two  Active  Interventions for Problem Two Long Term Goal   will place call to MD office to make a follow up appointment for labs and office visit.  THN Long Term Goal (31-90) days  Patient will report decrease in a1c in the next 90 days.  THN Long Term Goal Start Date  05/02/16  THN Long Term Goal Met Date  07/04/16  THN CM Short Term Goal #1 (0-30 days)  Patient will record CBG readings and comments in [facility] calendar for the next 30 days  THN CM Short Term Goal #1 Start Date  05/02/16  [facility] CM Short Term Goal #1 Met Date   06/07/16  Interventions for Short Term Goal #2   Reviewed importance of eating a daily bedtime snack.  THN CM Short Term Goal #2 (0-30 days)  Patient will report exercising 10 minutes per day for the next 30 days.  THN CM Short Term Goal #2 Start Date  07/04/16  [facility] CM Short Term Goal #2 Met Date  07/25/16  Interventions for Short Term Goal #2  Reviewed benefits of exercise post stroke and for DM control., Encouraged patient to discuss with physical therapist his need for home exercise plan.  THN CM Short Term Goal #3 (0-30 days)  Patient will monitor CBG twice a day with one reading 1-2 hours after a meal for the next  30 days.  THN CM Short Term Goal #3 Start Date  11/13/16  Interventions for Short Term Goal #3  Reviewed importance of taking CBG readings throughtout the day.  Encouraged patient to monitor and reording any abnormal events on [facility] calendar or CBG log.     [facility] CM Care Plan Problem Three   Flowsheet Row Most Recent Value  Care Plan Problem Three  Alteration in blood pressure  Role Documenting the Problem Three  Care Management Coordinator  Care Plan for Problem Three  Active  THN Long Term Goal  (31-90) days  Patient will report decreased blood pressure in the next 90 days.  THN Long Term Goal Start Date  07/04/16  THN Long Term Goal Met Date  11/13/16  Interventions for Problem Three Long Term Goal  encouraged patient to monitor BP daily and record in [facility] calendar. Encouraged patient to make an appointment for cardiology.  THN CM Short Term Goal #1 (0-30 days)  Patient will report exercising for 15 minutes three times per week for the next 30 days using the treadmill.  THN CM Short Term Goal #1 Start Date  10/09/16  [facility] CM Short Term Goal #1 Met Date  11/13/16  Interventions for Short Term Goal #1  reviewed benefits of exercise for BP and DM control.  THN CM Short Term Goal #2 (0-30 days)  Patient will create and follow a daily schedule of awakening time and sleep time for the next 30 days.  THN CM Short Term Goal #2 Start Date  10/09/16  [facility] CM Short Term Goal #2 Met Date  11/13/16  Interventions for Short Term Goal #2  Reviewed the importance of following a daily sleep and awake scheduled as well as an daily schdule to get activites completed.  Encouraged wife to assist patient with success.  THN CM Short Term Goal #3 (0-30 days)  Patient will report exercising 5 times per week for the next 30 days.  THN  CM Short Term Goal #3 Start Date  09/11/16  [facility] CM Short Term Goal #3 Met Date  11/13/16  Interventions for Short Term Goal #3  reviewed goals with patient and he knows he needs to exerise and is going to try again.   THN CM Short Term Goal #4 (0-30 days)  Patient reports that he will decrease his bread servings to 4 per day for the next 30 days.   THN CM Short Term Goal #4 Start Date  08/14/16  THN CM Short Term Goal #4 Met Date  09/11/16  Interventions for Short Term Goal #4  Reviewed importance of DM control  reviewed importance to call MD for high CBG readings.  Will send this note to MD>       Tomasa Rand, RN, BSN, CEN [place] Coordinator [phone]

## 2016-11-13 NOTE — Patient Outreach (Signed)
[facility]) Care Management  [place]   11/13/16  Mark Stewart 20-May-1946 [id number]  Subjective: 71year old male referred to [facility] for medication management due to uncontrolled type 2 diabetes and hypertension. Patient has past medical history of CVA (02/26/16), hypertension, diabetes, hyperlipidemia, and hypogonadism. He was hospitalized in 02/2016 with a acute ischemic stroke.  Today [facility] pharmacist home visit was performed with [facility] RN.  Patient reports adherence to his medications.  Reports only missing medications one night.   States that he has not started trying to be on a "schedule."  Is working 5-6 days per week.  He is missing some BP readings due to his cuff being messed up.   Reports his mood has improved.   Exercise: Denies home exercise but is active working 5-6 days per week and has been throwing ball with his grandson.   Denies chest pain, shortness of breath, dizziness.   Denies pain/burning upon urination  Denies hypoglycemia.   Reports nocturia 1-2 times per night and sometimes 0 times per night which he states has improved.   Reports self foot exams.  Denies changes.   Objective:  Blood Pressure 134/68 Heart Rate 67  Date BP HR Time  2/6 154/82 68 756 AM  2/5 167/79 66 800 PM   2/5 153/91 71 1010 AM  2/4 172/87 66 830 AM  1/31 132/74 64 1130 AM  1/26 153/72 70 820 AM  1/25 131/73 65 1030 AM  1/24 160/76 62 956 AM  1/23 168/81 62 815 AM  1/22 173/95 69 930 AM  1/21 148/87 65 830 AM  1/20 159/83 69 600 PM  1/20 152/80 68 930 AM  1/19 164/80 68 945 AM   *Blood Pressure are prior to Medications per patient report    Encounter Medications: Outpatient Encounter Prescriptions as of 11/13/2016  Medication Sig  . Amlodipine-Valsartan-HCTZ 10-320-25 MG TABS Take 0.5 tablets by mouth 2 (two) times daily.  Marland Kitchen aspirin EC 325 MG tablet Take 1 tablet (325 mg total) by mouth daily.  Marland Kitchen atorvastatin (LIPITOR) 80 MG tablet Take 80  mg by mouth daily.  . carvedilol (COREG) 25 MG tablet Take 37.5 mg by mouth 2 (two) times daily.   . Cinnamon 500 MG TABS Take 1,000 mg by mouth daily.  . cloNIDine (CATAPRES) 0.1 MG tablet Take 0.1 mg by mouth 2 (two) times daily.   . empagliflozin (JARDIANCE) 10 MG TABS tablet Take 10 mg by mouth daily.  . fluticasone (FLONASE) 50 MCG/ACT nasal spray Place 2 sprays into both nostrils daily as needed for allergies or rhinitis. Reported on 04/26/2016  . insulin detemir (LEVEMIR) 100 UNIT/ML injection Inject 15 Units into the skin at bedtime.  . metFORMIN (GLUCOPHAGE) 1000 MG tablet Take 1,000 mg by mouth 2 (two) times daily with a meal.   No facility-administered encounter medications on file as of 11/13/2016.     Functional Status: In your present state of health, do you have any difficulty performing the following activities: 10/09/2016 06/06/2016  Hearing? - N  Vision? - N  Difficulty concentrating or making decisions? - N  Walking or climbing stairs? - N  Dressing or bathing? - N  Doing errands, shopping? - N  Conservation officer, nature and eating ? N -  Using the Toilet? N -  In the past six months, have you accidently leaked urine? N -  Do you have problems with loss of bowel control? N -  Managing your Medications? N -  Managing your Finances? N -  Housekeeping or managing your Housekeeping? N -  Some recent data might be hidden    Fall/Depression Screening: PHQ 2/9 Scores 11/13/2016 06/18/2016 06/06/2016 06/06/2016 05/02/2016 04/30/2016  PHQ - 2 Score 4 6 4  0 0 1  PHQ- 9 Score 11 14 13  - - -    Assessment: Hypertension: Home blood pressure at goal today but home blood pressure remains above goal <140/90 following titration of thiazide and ACE inhibitor.  Patient continues on clonidine 0.1 mg twice daily, carvedilol 37.5 mg twice daily, and amlodipine/valsartan/HCTZ 10/320/25 mg 1/2 tablet twice daily.    Plan: Will contact cardiology to consider transition of HCTZ to chlorthalidone for greater  blood pressure effects or to consider further increasing carvedilol (patient has minimal heart rate to work with as most home heart rates are in 60s).  If patient is switched to chlorthalidone he may need repeat BMET to monitor renal function and electrolyte changes.  Patient will Check post prandial (2 hours after meals) CBG at least 1 time per day.  Instructed to Continue checking blood pressure and heart rate and write the values down.   Followup home visit in 1 month.  [facility] pharmacy will consider signing off at this visit.   [name], PharmD, BCPS [facility] PGY2 Pharmacy Resident [phone]  [facility] CM Care Plan Problem One   Flowsheet Row Most Recent Value  Care Plan Problem One  Knowledge deficit related to diabetes management/hypertension as evidenced by current A1C and elevated blood pressure  Role Documenting the Problem One  Clinical Pharmacist  Care Plan for Problem One  Active  THN CM Short Term Goal #1 (0-30 days)  Patient will check post prandial blood glucose at least 1 time per day over the next 30 days  THN CM Short Term Goal #1 Start Date  11/13/16  [facility] CM Short Term Goal #1 Met Date  07/04/16  Interventions for Short Term Goal #1  Discussed importance of checking CBG multiple times per day to provide a better understanding of blood glucose throughout the day  THN CM Short Term Goal #2 (0-30 days)  Patient will continue checking blood pressure and heart rate and write values down over the next 30 days as measured by patient report  THN CM Short Term Goal #2 Start Date  11/13/16  Interventions for Short Term Goal #2  Discussed blood pressure goals and importance of getting to goal blood pressure.  [place] pharmacist will send current home blood pressures to patients cardiologist.     [facility] CM Care Plan Problem Two   Flowsheet Row Most Recent Value  Care Plan Problem Two  Lack of motivation for lifestyle management related to chronic disease states  Role Documenting the Problem Two   Clinical Pharmacist  Care Plan for Problem Two  Active  THN CM Short Term Goal #1 (0-30 days)  Patient will exercise on treadmill 15 minutes per day for 3 days per week over the next 30 days as measured by patient reprot  THN CM Short Term Goal #1 Start Date  10/09/16  Interventions for Short Term Goal #2   Discussed importance of exercise in controlling diabetes and hypertension and reducing cardiovascular risk  THN CM Short Term Goal #2 (0-30 days)  Patient will start going to bed and waking up at scheduled times over the next 30 days as measured by patient report  THN CM Short Term Goal #2 Start Date  10/09/16  Interventions for Short Term Goal #2  Discussed importance of scheduled sleep in maintaining a healthy sleep schedule and not being drowsy and tired throughout the day

## 2016-11-19 NOTE — Progress Notes (Signed)
Since his hydrochlorothiazide is part of a combination antihypertensive I would not change it at this point. I think there is a little room with his heart rate. Please increase carvedilol to 50 mg twice daily. MCr

## 2016-11-20 ENCOUNTER — Other Ambulatory Visit: Payer: Self-pay | Admitting: Pharmacist

## 2016-11-20 ENCOUNTER — Telehealth: Payer: Self-pay

## 2016-11-20 MED ORDER — CARVEDILOL 25 MG PO TABS: 50.0000 mg | ORAL_TABLET | Freq: Two times a day (BID) | ORAL | 11 refills | Status: DC

## 2016-11-20 NOTE — Telephone Encounter (Signed)
From: Kem Parkinson, [facility]  Sent: 11/18/2016  8:32 PM  To: Sanda Klein, MD, Erma [place], [place]   Dr Sallyanne Kuster and Bernerd Pho, [place],  [facility] pharmacy saw patient in his home last week. His home blood pressure remains above goal <140/90 following titration of thiazide and ACE inhibitor. Patient continues on clonidine 0.1 mg twice daily, carvedilol 37.5 mg twice daily, and amlodipine/valsartan/HCTZ 10/320/25 mg 1/2 tablet twice daily.     Please see my attached note for home  Please consider transition of HCTZ to chlorthalidone for greater blood pressure effects or to consider further increasing carvedilol (Dr Sallyanne Kuster previously mentioned this but patient has minimal heart rate to work with as most home heart rates are in 60s). If patient is switched to chlorthalidone he may need repeat BMET to monitor renal function and electrolyte changes.   Thanks  [name], PharmD, [place] PGY2 Pharmacy Resident  [phone]

## 2016-11-20 NOTE — Patient Outreach (Addendum)
[facility]) Care Management  11/20/2016  Mark Stewart June 20, 1946 [id number]   71year old male referred to [place] for medication management due to uncontrolled type 2 diabetes and hypertension.  Called patient today to notify him of carvedilol dose change per Dr Sallyanne Kuster (response to my note sent on 11/13/16).  Patient will increase carvedilol to 50 mg twice daily.  I have instructed him he may take two 25 mg tablets twice daily until he runs out of his current supply but his cardiologist will be sending in a new prescription likely for the 50 mg tablets.  Also discussed monitoring his heart rate, blood pressure, and for signs/symptoms of orthostasis including dizziness or blurred vision especially when standing rapidly.  I have asked him to call myself, [facility] RN or his cardiology office if he does have signs/symptoms of orthostasis.  Patient demonstrated understanding.  Will followup medication management with home visit with [facility] RN/pharmacist in March.   [name], PharmD, [place] PGY2 Pharmacy Resident [phone]

## 2016-11-20 NOTE — Telephone Encounter (Signed)
Rx(s) sent to pharmacy electronically.  

## 2016-11-20 NOTE — Telephone Encounter (Signed)
Mark Klein, MD at 11/13/2016 2:30 PM   Status: Signed    Since his hydrochlorothiazide is part of a combination antihypertensive I would not change it at this point. I think there is a little room with his heart rate. Please increase carvedilol to 50 mg twice daily. MCr

## 2016-11-21 DIAGNOSIS — H26493 Other secondary cataract, bilateral: Secondary | ICD-10-CM | POA: Diagnosis not present

## 2016-11-21 DIAGNOSIS — E113293 Type 2 diabetes mellitus with mild nonproliferative diabetic retinopathy without macular edema, bilateral: Secondary | ICD-10-CM | POA: Diagnosis not present

## 2016-12-06 DIAGNOSIS — E118 Type 2 diabetes mellitus with unspecified complications: Secondary | ICD-10-CM | POA: Diagnosis not present

## 2016-12-06 DIAGNOSIS — E784 Other hyperlipidemia: Secondary | ICD-10-CM | POA: Diagnosis not present

## 2016-12-06 DIAGNOSIS — I10 Essential (primary) hypertension: Secondary | ICD-10-CM | POA: Diagnosis not present

## 2016-12-06 DIAGNOSIS — Z794 Long term (current) use of insulin: Secondary | ICD-10-CM | POA: Diagnosis not present

## 2016-12-12 DIAGNOSIS — E784 Other hyperlipidemia: Secondary | ICD-10-CM | POA: Diagnosis not present

## 2016-12-12 DIAGNOSIS — Z794 Long term (current) use of insulin: Secondary | ICD-10-CM | POA: Diagnosis not present

## 2016-12-12 DIAGNOSIS — I10 Essential (primary) hypertension: Secondary | ICD-10-CM | POA: Diagnosis not present

## 2016-12-12 DIAGNOSIS — E118 Type 2 diabetes mellitus with unspecified complications: Secondary | ICD-10-CM | POA: Diagnosis not present

## 2016-12-13 ENCOUNTER — Other Ambulatory Visit: Payer: Self-pay

## 2016-12-13 NOTE — Patient Outreach (Signed)
Telephone call: Placed call to patient to change appointment time to 9am.  Patient agreed.  Patient states that he is doing well. Reports that he feels good. States saw endocrinology this week and Jardiance was doubled. Reports levemir was increased to 20 units and patient is to increase by 2 units per day until he gets to goal of less than 120 per day fasting.   Patient reports CBG today of 134.  PLAN: will see patient as planned on 12/18/2016 at [place].  In basket message to [place] for update.   Tomasa Rand, RN, BSN, CEN [facility] [employer] [phone]

## 2016-12-17 ENCOUNTER — Other Ambulatory Visit: Payer: Self-pay

## 2016-12-17 DIAGNOSIS — I10 Essential (primary) hypertension: Secondary | ICD-10-CM | POA: Diagnosis not present

## 2016-12-17 DIAGNOSIS — E784 Other hyperlipidemia: Secondary | ICD-10-CM | POA: Diagnosis not present

## 2016-12-17 NOTE — Patient Outreach (Signed)
Care Coordination: Placed call to patient about scheduled home visit for 12/18/2016.  Due to snow and sleet and potential hazardous road appointment rescheduled to 12/25/2016.  [place] notified.   PLAN: home visit changed to 12/25/2016  Tomasa Rand, RN, BSN, CEN [place] Coordinator [phone]

## 2016-12-18 ENCOUNTER — Ambulatory Visit: Payer: Self-pay | Admitting: Pharmacist

## 2016-12-18 ENCOUNTER — Ambulatory Visit: Payer: Self-pay

## 2016-12-25 ENCOUNTER — Encounter: Payer: Self-pay | Admitting: Pharmacist

## 2016-12-25 ENCOUNTER — Other Ambulatory Visit: Payer: Self-pay | Admitting: Pharmacist

## 2016-12-25 ENCOUNTER — Other Ambulatory Visit: Payer: Self-pay

## 2016-12-25 NOTE — Patient Outreach (Signed)
[facility]) Care Management   12/25/2016  Mark Stewart 1945/12/26 [id number]  Mark Stewart is an 71 y.o. male Joint visit with [place] pharmacy Subjective: Patient reports that he has been feeling good.  Reports went to endocrinologist and primary MD in the last week.  Reports Hgb A1c up to 10.6 on 12/06/2016.   Reports increased levemir to 22 units and increased Jardiance.  Reports that he was placed on sliding scale insulin with meal time. Reports that he has only used his sliding scale once. Reports after taking sliding scale his blood sugar was low the next morning ( reading  88) Reports that he felt symptoms of shaky and feeling bad.  He ate something and then felt better. Reports BP low at MD office about 2 weeks ago.  According to BP Log BP has been elevated 119-171/71-86 in the last week.  Todays home monitoring of 171/86  ( early this am prior to this home visit)Heart rates 60's.    CBG readings 96-233 in the last week. Todays CBG reading 187.  Patient reports no current exercise plan.  Currently is not on a routine schedule for sleeping and working. Patient reports that he continues to be sleepy most of the time. Denies depression. Patient reports that he is had been eating more potatoes and bread.   Objective:  Awake and alert. Engaged in home visit. Ambulating without difficulty.  Vitals:   12/25/16 1040 12/25/16 1046 12/25/16 1047  BP: (!) 142/80 (!) 162/80 (!) 166/85  Pulse: 74    Resp: 18    SpO2: 97%    Weight: 180 lb (81.6 kg)     Review of Systems  Constitutional: Positive for malaise/fatigue.  HENT: Negative.   Eyes: Negative.        Recent eye exam within the last few weeks  Respiratory: Negative.   Cardiovascular: Negative.   Gastrointestinal: Negative.   Genitourinary: Positive for frequency.  Musculoskeletal: Negative.   Skin: Negative.   Neurological:       Reports occasional staggering  Endo/Heme/Allergies: Negative.    Psychiatric/Behavioral:       Reports decreased depression    Physical Exam  Constitutional: He is oriented to person, place, and time. He appears well-developed and well-nourished.  Cardiovascular: Normal rate, normal heart sounds and intact distal pulses.   Respiratory: Effort normal and breath sounds normal.  Lungs clear  GI: Soft. Bowel sounds are normal.  Musculoskeletal: Normal range of motion. He exhibits edema.  1 plus edema above sock level on both legs  Neurological: He is alert and oriented to person, place, and time.  Skin: Skin is warm and dry.  Feet without open skin or lesions  Psychiatric: He has a normal mood and affect. His behavior is normal. Judgment and thought content normal.    Encounter Medications:   Outpatient Encounter Prescriptions as of 12/25/2016  Medication Sig  . Amlodipine-Valsartan-HCTZ 10-320-25 MG TABS Take 0.5 tablets by mouth 2 (two) times daily.  Marland Kitchen aspirin EC 325 MG tablet Take 1 tablet (325 mg total) by mouth daily.  Marland Kitchen atorvastatin (LIPITOR) 80 MG tablet Take 80 mg by mouth daily.  . carvedilol (COREG) 25 MG tablet Take 2 tablets (50 mg total) by mouth 2 (two) times daily.  . Cinnamon 500 MG TABS Take 1,000 mg by mouth daily.  . cloNIDine (CATAPRES) 0.1 MG tablet Take 0.1 mg by mouth 2 (two) times daily.   . empagliflozin (JARDIANCE) 25 MG TABS tablet Take  25 mg by mouth daily.   . fluticasone (FLONASE) 50 MCG/ACT nasal spray Place 2 sprays into both nostrils daily as needed for allergies or rhinitis. Reported on 04/26/2016  . insulin detemir (LEVEMIR) 100 UNIT/ML injection Inject 15 Units into the skin at bedtime.  . metFORMIN (GLUCOPHAGE) 1000 MG tablet Take 1,000 mg by mouth 2 (two) times daily with a meal.   No facility-administered encounter medications on file as of 12/25/2016.     Functional Status:   In your present state of health, do you have any difficulty performing the following activities: 10/09/2016 06/06/2016  Hearing? - N   Vision? - N  Difficulty concentrating or making decisions? - N  Walking or climbing stairs? - N  Dressing or bathing? - N  Doing errands, shopping? - N  Conservation officer, nature and eating ? N -  Using the Toilet? N -  In the past six months, have you accidently leaked urine? N -  Do you have problems with loss of bowel control? N -  Managing your Medications? N -  Managing your Finances? N -  Housekeeping or managing your Housekeeping? N -  Some recent data might be hidden    Fall/Depression Screening:    PHQ 2/9 Scores 11/13/2016 06/18/2016 06/06/2016 06/06/2016 05/02/2016 04/30/2016  PHQ - 2 Score _0 0 0 1  PHQ- 9 Score _1 - - -    Assessment:  Reviewed progress over the last 7 months with patient.  Reviewed patient current goals.  At this point patient is not interested in exercise. Reports he knows about diet.  Currently patients goals are getting on the correct dose of insulin to manage his DM.   Plan: Patient agrees to closing his case for nursing and remaining active with [place].  Patient has set goals with [facility] pharmacy for ongoing assistance.  Reminded patient if he has nursing needs in the future to please call. Patient agreed. Will close to nursing. Will send update to primary MD. Reviewed plan with [place].   THN CM Care Plan Problem One     Most Recent Value  Care Plan Problem One  Knowledge deficit of diabetes related to dietary modifications  Role Documenting the Problem One  Care Management Coordinator  Care Plan for Problem One  Active  THN Long Term Goal (31-90 days)  Patient will be able to create a menu for the week choosing appropriate foods in the next 60 days  THN Long Term Goal Start Date  04/30/16  [facility] Long Term Goal Met Date  07/04/16  Interventions for Problem One Long Term Goal  Reviewed importance of daily meal planning.  THN CM Short Term Goal #1 (0-30 days)  patient will identify 2 foods high in protein within the next 3 weeks   THN CM Short Term Goal #1 Start Date  04/30/16  [facility] CM Short Term Goal #1 Met Date  06/07/16  Interventions for Short Term Goal #1  Educate patient about food types:  simple carbs, proteins, fats  THN CM Short Term Goal #2 (0-30 days)  patient will log all consumed foods in a journal over the next 30 days  THN CM Short Term Goal #2 Start Date  04/30/16  [facility] CM Short Term Goal #2 Met Date  06/07/16  Interventions for Short Term Goal #2  Reviewed high protein foods and provided EMMI education on DM and diets. THN DM folder provided to patient    [facility] CM  Care Plan Problem Two     Most Recent Value  Care Plan Problem Two  active  Role Documenting the Problem Two  Care Management Coordinator  Care Plan for Problem Two  Active  Interventions for Problem Two Long Term Goal   will place call to MD office to make a follow up appointment for labs and office visit.  THN Long Term Goal (31-90) days  Patient will report decrease in a1c in the next 90 days.  THN Long Term Goal Start Date  05/02/16  THN Long Term Goal Met Date  07/04/16  THN CM Short Term Goal #1 (0-30 days)  Patient will record CBG readings and comments in [facility] calendar for the next 30 days  THN CM Short Term Goal #1 Start Date  05/02/16  [facility] CM Short Term Goal #1 Met Date   06/07/16  Interventions for Short Term Goal #2   Reviewed importance of eating a daily bedtime snack.  THN CM Short Term Goal #2 (0-30 days)  Patient will report exercising 10 minutes per day for the next 30 days.  THN CM Short Term Goal #2 Start Date  07/04/16  [facility] CM Short Term Goal #2 Met Date  07/25/16  Interventions for Short Term Goal #2  Reviewed benefits of exercise post stroke and for DM control., Encouraged patient to discuss with physical therapist his need for home exercise plan.  THN CM Short Term Goal #3 (0-30 days)  Patient will monitor CBG twice a day with one reading 1-2 hours after a meal for the next  30 days.  THN CM Short Term Goal #3 Start Date   11/13/16  [facility] CM Short Term Goal #3 Met Date  12/25/16  Interventions for Short Term Goal #3  Reviewed importance of taking CBG readings throughtout the day.  Encouraged patient to monitor and reording any abnormal events on [facility] calendar or CBG log.     THN CM Care Plan Problem Three     Most Recent Value  Care Plan Problem Three  Alteration in blood pressure  Role Documenting the Problem Three  Care Management Coordinator  Care Plan for Problem Three  Active  THN Long Term Goal (31-90) days  Patient will report decreased blood pressure in the next 90 days.  THN Long Term Goal Start Date  07/04/16  THN Long Term Goal Met Date  11/13/16  Interventions for Problem Three Long Term Goal  encouraged patient to monitor BP daily and record in [facility] calendar. Encouraged patient to make an appointment for cardiology.  THN CM Short Term Goal #1 (0-30 days)  Patient will report exercising for 15 minutes three times per week for the next 30 days using the treadmill.  THN CM Short Term Goal #1 Start Date  10/09/16  [facility] CM Short Term Goal #1 Met Date  11/13/16  Interventions for Short Term Goal #1  reviewed benefits of exercise for BP and DM control.  THN CM Short Term Goal #2 (0-30 days)  Patient will create and follow a daily schedule of awakening time and sleep time for the next 30 days.  THN CM Short Term Goal #2 Start Date  10/09/16  [facility] CM Short Term Goal #2 Met Date  11/13/16  Interventions for Short Term Goal #2  Reviewed the importance of following a daily sleep and awake scheduled as well as an daily schdule to get activites completed.  Encouraged wife to assist patient with success.  THN CM Short Term Goal #  3 (0-30 days)  Patient will report exercising 5 times per week for the next 30 days.  THN  CM Short Term Goal #3 Start Date  09/11/16  [facility] CM Short Term Goal #3 Met Date  11/13/16  Interventions for Short Term Goal #3  reviewed goals with patient and he knows he needs to exerise and is going to  try again.   THN CM Short Term Goal #4 (0-30 days)  Patient reports that he will decrease his bread servings to 4 per day for the next 30 days.   THN CM Short Term Goal #4 Start Date  08/14/16  THN CM Short Term Goal #4 Met Date  09/11/16  Interventions for Short Term Goal #4  Reviewed importance of DM control  reviewed importance to call MD for high CBG readings.  Will send this note to MD>      Tomasa Rand, RN, BSN, CEN [place] Coordinator [phone]

## 2016-12-25 NOTE — Patient Outreach (Signed)
[facility]) Care Management  [place]   12/25/2016  Mark Stewart 04/03/1946 [id number]  Subjective: 71year old male referred to [place] for medication management due to uncontrolled type 2 diabetes and hypertension. Pharmacy home visit performed today with [facility] RN.  Currently on Levemir 22 units daily (Per MD to increase by 2 units every 3 days for fasting CBG > 120).  Patient has only increased 1 time since his endocrinology appointment on 12/06/16.   He has not been using Novolog sliding scale very often (2 times).    Patient reports he is sleepy throughout the day and isn't sleeping very much at night as he falls asleep in his chair.   Patient reported dietary habits: Eats 3 meals/day.  Reports he is eating too much potatoes and bread.  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    Patient reports hypoglycemic events on one occasion after using Novolog sliding scale with a CBG of 88 mg/dL.  Denies hypoglycemic episodes less than 70 mg/dL.   Patient reports nocturia 2-3 times per night.  Patient denies pain/burning upon urination.  Patient denies neuropathy. Patient denies visual changes. Reports last eye exam in March 2018.   Patient reports self foot exams. Denies changes.  Denies chest pain/shortness of breath.    Patient reported self monitored blood glucose frequency 1-2 times per day.   Home fasting CBG (see scanned document): 187, 137, 180, 127, 138, 96 mg/dL    Objective:  POCT glycosylated hemoglobin (Hb A1C) (12/06/2016 1:43 PM) POCT glycosylated hemoglobin (Hb A1C) (12/06/2016 1:43 PM)  Component Value Ref Range  HEMOGLOBIN A1C, POC 10.6 (A)    Vitals:   12/25/16 1039  BP: (!) 142/80  Pulse: 74   Lipid Panel     Component Value Date/Time   CHOL 121 02/27/2016 0536   TRIG 82 02/27/2016 0536   HDL 34 (L) 02/27/2016 0536   CHOLHDL 3.6  02/27/2016 0536   VLDL 16 02/27/2016 0536   LDLCALC 71 02/27/2016 0536   Encounter Medications: Outpatient Encounter Prescriptions as of 12/25/2016  Medication Sig  . Amlodipine-Valsartan-HCTZ 10-320-25 MG TABS Take 0.5 tablets by mouth 2 (two) times daily.  Marland Kitchen aspirin EC 325 MG tablet Take 1 tablet (325 mg total) by mouth daily.  Marland Kitchen atorvastatin (LIPITOR) 80 MG tablet Take 80 mg by mouth daily.  . carvedilol (COREG) 25 MG tablet Take 2 tablets (50 mg total) by mouth 2 (two) times daily. (Patient taking differently: Take 37.5 mg by mouth 2 (two) times daily. )  . cloNIDine (CATAPRES) 0.1 MG tablet Take 0.15 mg by mouth 2 (two) times daily.   . empagliflozin (JARDIANCE) 25 MG TABS tablet Take 25 mg by mouth daily.   . fluticasone (FLONASE) 50 MCG/ACT nasal spray Place 2 sprays into both nostrils daily as needed for allergies or rhinitis. Reported on 04/26/2016  . insulin aspart (NOVOLOG) 100 UNIT/ML injection Inject 0-12 Units into the skin 3 (three) times daily before meals. Per Sliding Scale  BG < 200: 0 units BG 201-250: 2 units; BG 251-300: 4 units; BG 301-350: 6 units; BG 351-400: 8 units; BG 401-450: 10 units; BG > 451: 12 units  . insulin detemir (LEVEMIR) 100 UNIT/ML injection Inject 22 Units into the skin at bedtime. If fasting blood sugars > 120 for 3 days, increase Levemir by 2 units.  May repeat as needed.  Marland Kitchen  metFORMIN (GLUCOPHAGE) 1000 MG tablet Take 1,000 mg by mouth 2 (two) times daily with a meal.  . [DISCONTINUED] Cinnamon 500 MG TABS Take 1,000 mg by mouth daily.   No facility-administered encounter medications on file as of 12/25/2016.     Functional Status: In your present state of health, do you have any difficulty performing the following activities: 10/09/2016 06/06/2016  Hearing? - N  Vision? - N  Difficulty concentrating or making decisions? - N  Walking or climbing stairs? - N  Dressing or bathing? - N  Doing errands, shopping? - N  Conservation officer, nature and eating ? N  -  Using the Toilet? N -  In the past six months, have you accidently leaked urine? N -  Do you have problems with loss of bowel control? N -  Managing your Medications? N -  Managing your Finances? N -  Housekeeping or managing your Housekeeping? N -  Some recent data might be hidden    Fall/Depression Screening: PHQ 2/9 Scores 11/13/2016 06/18/2016 06/06/2016 06/06/2016 05/02/2016 04/30/2016  PHQ - 2 Score 4 6 4  0 0 1  PHQ- 9 Score 11 14 13  - - -    Assessment: Diabetes: Currently uncontrolled with increased A1C.  Patient started on patient directed long acting insulin titration but has only increased one time since endocrinology visit.    Hypertension: Currently uncontrolled on amlodipine/valsartan/hctz 10/320/25 mg daily, carvedilol 37.5 mg BID (prescribed 50 mg BID), and clonidine 0.15 mg BID  Plan: -Instructed patient to increase Carvedilol to 50 mg BID as prescribed by Dr Sallyanne Kuster -Encouraged patient to self titrate Levemir by 2 units every 3 days for FBG >120 per endocrinology -Counseled on hypoglycemia signs/symptoms/treatment using rule of 15s -Patient to email CBG and blood pressure log to [facility] pharmacist in 1-2 weeks to assess for further diabetes and hypertension medication management intervention  [name], PharmD, BCPS [facility] PGY2 Pharmacy Resident [phone]  [facility] CM Care Plan Problem One     Most Recent Value  Care Plan Problem One  Knowledge deficit related to diabetes management/hypertension as evidenced by current A1C and elevated blood pressure  Role Documenting the Problem One  Clinical Pharmacist  Care Plan for Problem One  Active  THN CM Short Term Goal #1 (0-30 days)  Patient will check blood glucose at least once daily and blood pressure at least once daily and will send log to [facility] pharmacist every 2 weeks over the next 30 days  THN CM Short Term Goal #1 Start Date  12/25/16  [facility] CM Short Term Goal #1 Met Date  12/25/16  Interventions for Short Term Goal #1   Discussed importance of blood pressure and glucose control.  [facility] pharmacist plans to assess values and followup with appropriate providers for further medication management.   THN CM Short Term Goal #2 (0-30 days)  Patient will continue checking blood pressure and heart rate and write values down over the next 30 days as measured by patient report  THN CM Short Term Goal #2 Start Date  11/13/16  [facility] CM Short Term Goal #2 Met Date  12/25/16  Interventions for Short Term Goal #2  Discussed blood pressure goals and importance of getting to goal blood pressure.  [place] pharmacist will send current home blood pressures to patients cardiologist.

## 2017-01-23 ENCOUNTER — Other Ambulatory Visit: Payer: Self-pay | Admitting: Pharmacist

## 2017-01-23 NOTE — Patient Outreach (Signed)
[place] [facility]) Care Management  01/23/2017  Mark Stewart 17-Oct-1945 [id number]  71 y.o. year old male referred to [place] for Medication Management (Pharmacy Telephone Outreach) Was unable to reach patient via telephone today and have left HIPAA compliant voicemail asking him to return my call (unsuccessful outreach #1).  Patient was last seen on 12/25/16 and had agreed to email his CBG and blood pressure logs but has not yet sent them via email.    Plan: Followup in 2-3 days via telephone and will assess the need for followup home visit.    [name], PharmD, [place] PGY2 Pharmacy Resident [phone]

## 2017-01-25 ENCOUNTER — Other Ambulatory Visit: Payer: Self-pay | Admitting: Pharmacist

## 2017-01-25 NOTE — Patient Outreach (Signed)
[place] [facility]) Care Management  01/25/2017  RICHAD RAMSAY 04/10/1946 [id number]   71 y.o. year old male referred to [place] for Medication Management (Pharmacy Telephone Followup) Called patient today to followup home blood glucose and blood pressure.  Patient plans to email or fax home readings to [facility] pharmacist today  Plan: Followup in 1 week via telephone  [name], PharmD, [place] PGY2 Pharmacy Resident [phone]

## 2017-01-29 ENCOUNTER — Encounter: Payer: Self-pay | Admitting: Nurse Practitioner

## 2017-01-29 ENCOUNTER — Ambulatory Visit (INDEPENDENT_AMBULATORY_CARE_PROVIDER_SITE_OTHER): Payer: PPO | Admitting: Nurse Practitioner

## 2017-01-29 VITALS — BP 102/58 | HR 65 | Wt 187.0 lb

## 2017-01-29 DIAGNOSIS — E782 Mixed hyperlipidemia: Secondary | ICD-10-CM | POA: Diagnosis not present

## 2017-01-29 DIAGNOSIS — I10 Essential (primary) hypertension: Secondary | ICD-10-CM

## 2017-01-29 DIAGNOSIS — I6350 Cerebral infarction due to unspecified occlusion or stenosis of unspecified cerebral artery: Secondary | ICD-10-CM

## 2017-01-29 DIAGNOSIS — E1165 Type 2 diabetes mellitus with hyperglycemia: Secondary | ICD-10-CM | POA: Diagnosis not present

## 2017-01-29 NOTE — Patient Instructions (Addendum)
Stressed the importance of management of risk factors to prevent further stroke Continue aspirin for secondary stroke prevention Maintain strict control of hypertension with blood pressure goal below 130/90, today's reading 102/58. continue antihypertensive medications and f/u with cardiology Control of diabetes with hemoglobin A1c below7 followed by endocrinology  most recent hemoglobin A1c8.5  continue diabetic medications  Cholesterol with LDL cholesterol less than 70, followed by primary care,  continue Lipitor Exercise by walking, slowly increase , eat healthy diet with whole grains,  fresh fruits and vegetables Follow-up in 6 months Stroke Prevention Some medical conditions and behaviors are associated with an increased chance of having a stroke. You may prevent a stroke by making healthy choices and managing medical conditions. How can I reduce my risk of having a stroke?  Stay physically active. Get at least 30 minutes of activity on most or all days.  Do not smoke. It may also be helpful to avoid exposure to secondhand smoke.  Limit alcohol use. Moderate alcohol use is considered to be:  No more than 2 drinks per day for men.  No more than 1 drink per day for nonpregnant women.  Eat healthy foods. This involves:  Eating 5 or more servings of fruits and vegetables a day.  Making dietary changes that address high blood pressure (hypertension), high cholesterol, diabetes, or obesity.  Manage your cholesterol levels.  Making food choices that are high in fiber and low in saturated fat, trans fat, and cholesterol may control cholesterol levels.  Take any prescribed medicines to control cholesterol as directed by your health care provider.  Manage your diabetes.  Controlling your carbohydrate and sugar intake is recommended to manage diabetes.  Take any prescribed medicines to control diabetes as directed by your health care provider.  Control your hypertension.  Making  food choices that are low in salt (sodium), saturated fat, trans fat, and cholesterol is recommended to manage hypertension.  Ask your health care provider if you need treatment to lower your blood pressure. Take any prescribed medicines to control hypertension as directed by your health care provider.  If you are 9-9 years of age, have your blood pressure checked every 3-5 years. If you are 40 years of age or older, have your blood pressure checked every year.  Maintain a healthy weight.  Reducing calorie intake and making food choices that are low in sodium, saturated fat, trans fat, and cholesterol are recommended to manage weight.  Stop drug abuse.  Avoid taking birth control pills.  Talk to your health care provider about the risks of taking birth control pills if you are over 88 years old, smoke, get migraines, or have ever had a blood clot.  Get evaluated for sleep disorders (sleep apnea).  Talk to your health care provider about getting a sleep evaluation if you snore a lot or have excessive sleepiness.  Take medicines only as directed by your health care provider.  For some people, aspirin or blood thinners (anticoagulants) are helpful in reducing the risk of forming abnormal blood clots that can lead to stroke. If you have the irregular heart rhythm of atrial fibrillation, you should be on a blood thinner unless there is a good reason you cannot take them.  Understand all your medicine instructions.  Make sure that other conditions (such as anemia or atherosclerosis) are addressed. Get help right away if:  You have sudden weakness or numbness of the face, arm, or leg, especially on one side of the body.  Your face  or eyelid droops to one side.  You have sudden confusion.  You have trouble speaking (aphasia) or understanding.  You have sudden trouble seeing in one or both eyes.  You have sudden trouble walking.  You have dizziness.  You have a loss of balance or  coordination.  You have a sudden, severe headache with no known cause.  You have new chest pain or an irregular heartbeat. Any of these symptoms may represent a serious problem that is an emergency. Do not wait to see if the symptoms will go away. Get medical help at once. Call your local emergency services (911 in [place] Do not drive yourself to the hospital. This information is not intended to replace advice given to you by your health care provider. Make sure you discuss any questions you have with your health care provider. Document Released: 11/01/2004 Document Revised: 03/01/2016 Document Reviewed: 03/27/2013 Elsevier Interactive Patient Education  2017 [employer].

## 2017-01-29 NOTE — Progress Notes (Signed)
[facility]  PATIENT: Mark Stewart DOB: 18-Dec-1945   REASON FOR VISIT:  follow-up for right thalamic infarct secondary to small vessel disease, memory loss since stroke, risk factors of hypertension hyperlipidemia and insulin-dependent diabetes not well controlled HISTORY FROM: Patient and wife    HISTORY OF PRESENT ILLNESS:UPDATE 04/24/2018CM [name], 71 year old male returns for follow-up with history of right ischemic thalmic infarction in May 2017. He is currently on aspirin for secondary stroke prevention with no bruising and bleeding in addition he is on Lipitor without complaints of myalgias. He complains of a lot of fatigue however his diabetes is still not in control and his most recent hemoglobin A1c was 8. He has now been put on a sliding scale. Blood pressure in the office today 102/58. His wife says he doesn't drink enough. He has some recent reduction to his blood pressure medicine from cardiology note. He does little exercise he returns for reevaluation    UPDATE 10/23/2017CM  [name], 71 year old male returns for follow-up. He has a history of acute right ischemic thalamic infarction which occurred in May 2017. He remains on aspirin for secondary stroke prevention without significant bruising or bleeding. He denies further stroke or TIA symptoms. Blood pressure is well controlled in the office today at 131/78. He remains on Coreg Catapres and valsartan. He exercises very little. He does not perform his home exercise program. Most recent hemoglobin A1c 8.5 on 06/20/2016. He is also Lipitor without complaints of muscle aches. He returns for reevaluation  HISTORY 04/26/16 CM.Mark Stewart is an 71 y.o. male with a history of diabetes mellitus, hypertension and hyperlipidemia presenting with a complaint of dizziness and unstable gait for about 4 days. He was initially seen in the ED at [facility] on 02/27/16  and subsequently transferred to [facility]  for further management. MRI of his brain showed acute right ischemic thalamic infarction. He had no change in speech. Family has noted slight droop of left side of his face. He has not experienced weakness nor sensory changes involving extremities. He has no previous history of stroke nor TIA. He has not been taking aspirin consistently on a daily basis. NIH stroke score was 2. He was LKW 02/22/2016, time unclear. Patient was not administered IV t-PA secondary to delay in arrival. He was admitted for further evaluation and treatment.CTA head/neck - No significant large vessel finding. High grade stenosis R P4 2D Echo EF 55-60% LDL 71 hemoglobin A1c of 10.7. He returns for follow-up. He has not had further stroke or TIA symptoms. He is currently getting physical therapy for lower extremity weakness. He is not doing home exercise program at this point. He had recent changes to his insulin dose. He is keeping a log of his blood pressures. He has multiple questions. He returns for reevaluation   REVIEW OF SYSTEMS: Full 14 system review of systems performed and notable only for those listed, all others are neg:  Constitutional: Fatigue  Cardiovascular: neg Ear/Nose/Throat: neg  Skin: neg Eyes: neg Respiratory: neg Gastroitestinal: neg  Hematology/Lymphatic: neg Endocrine: neg Musculoskeletal: neg Allergy/Immunology: neg Neurological:neg Psychiatric: neg Sleep : neg   ALLERGIES: Allergies  Allergen Reactions  . Benazepril Cough  . Penicillins Rash    Has patient had a PCN reaction causing immediate rash, facial/tongue/throat swelling, SOB or lightheadedness with hypotension: yes Has patient had a PCN reaction causing severe rash involving mucus membranes or skin necrosis: no Has patient had a PCN reaction that required hospitalization: no Has  patient had a PCN reaction occurring within the last 10 years: no If all of the above answers are "NO", then may proceed with Cephalosporin use.      HOME MEDICATIONS: Outpatient Medications Prior to Visit  Medication Sig Dispense Refill  . Amlodipine-Valsartan-HCTZ 10-320-25 MG TABS Take 0.5 tablets by mouth 2 (two) times daily. 30 tablet 6  . aspirin EC 325 MG tablet Take 1 tablet (325 mg total) by mouth daily. 30 tablet 0  . atorvastatin (LIPITOR) 80 MG tablet Take 80 mg by mouth daily.    . carvedilol (COREG) 25 MG tablet Take 2 tablets (50 mg total) by mouth 2 (two) times daily. (Patient taking differently: Take 37.5 mg by mouth 2 (two) times daily. ) 120 tablet 11  . cloNIDine (CATAPRES) 0.1 MG tablet Take 0.15 mg by mouth 2 (two) times daily.   5  . empagliflozin (JARDIANCE) 25 MG TABS tablet Take 25 mg by mouth daily.     . fluticasone (FLONASE) 50 MCG/ACT nasal spray Place 2 sprays into both nostrils daily as needed for allergies or rhinitis. Reported on 04/26/2016    . insulin detemir (LEVEMIR) 100 UNIT/ML injection Inject 22 Units into the skin at bedtime. If fasting blood sugars > 120 for 3 days, increase Levemir by 2 units.  May repeat as needed.    . metFORMIN (GLUCOPHAGE) 1000 MG tablet Take 1,000 mg by mouth 2 (two) times daily with a meal.    . insulin aspart (NOVOLOG) 100 UNIT/ML injection Inject 0-12 Units into the skin 3 (three) times daily before meals. Per Sliding Scale  BG < 200: 0 units BG 201-250: 2 units; BG 251-300: 4 units; BG 301-350: 6 units; BG 351-400: 8 units; BG 401-450: 10 units; BG > 451: 12 units     No facility-administered medications prior to visit.     PAST MEDICAL HISTORY: Past Medical History:  Diagnosis Date  . Allergy   . Cataract    history of surgery bilaterally  . Diabetes mellitus without complication ([place])   . Hypercholesteremia   . Hypertension   . 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 Stroke [facility] 02/2016    PAST SURGICAL HISTORY: Past Surgical History:  Procedure Laterality Date  . EYE SURGERY    . PROSTATE SURGERY    . SPLENECTOMY       FAMILY HISTORY: Family History  Problem Relation Age of Onset  . Breast cancer Mother   . Diabetes Mellitus II Father   . Breast cancer Sister     SOCIAL HISTORY: Social History   Social History  . Marital status: Married    Spouse name: graye  . Number of children: N/A  . Years of education: N/A   Occupational History  . Not on file.   Social History Main Topics  . Smoking status: Former Smoker    Packs/day: 1.00    Years: 20.00  . Smokeless tobacco: Never Used     Comment: patient states last cigarette 35 years ago  . Alcohol use No  . Drug use: No  . Sexual activity: Not on file   Other Topics Concern  . Not on file   Social History Narrative  . No narrative on file     PHYSICAL EXAM  Vitals:   01/29/17 1104  BP: (!) 102/58  Pulse: 65  Weight: 187 lb (84.8 kg)   Body mass index is 25.36 kg/m.  Generalized: Well developed, in no  acute distress  Head: normocephalic and atraumatic,. Oropharynx benign  Neck: Supple, no carotid bruits  Cardiac: Regular rate rhythm, no murmur  Musculoskeletal: No deformity   Neurological examination   Mentation: Alert oriented to time, place, history taking. Attention span and concentration appropriate. Recent and remote memory intact.  Follows all commands speech and language fluent.  Cranial nerve II-XII: Pupils were equal round reactive to light extraocular movements were full, visual field were full on confrontational test. Facial sensation and strength were normal. hearing was intact to finger rubbing bilaterally. Uvula tongue midline. head turning and shoulder shrug were normal and symmetric.Tongue protrusion into cheek strength was normal. Motor: normal bulk and tone, full strength in the BUE, BLE, fine finger movements normal, no pronator drift. No focal weakness Sensory: normal and symmetric to light touch, pinprick, and  Vibration, in the upper and lower extremities Coordination: finger-nose-finger,  heel-to-shin bilaterally, no dysmetria. No tremor Reflexes: 1+ upper lower and symmetric, plantar responses were flexor bilaterally. Gait and Station: Rising up from seated position without assistance, normal stance,  moderate stride, good arm swing, smooth turning, able to perform tiptoe, and heel walking without difficulty. Tandem gait is mildly unsteady. No assistive device  DIAGNOSTIC DATA (LABS, IMAGING, TESTING) - I reviewed patient records, labs, notes, testing and imaging myself where available.  Lab Results  Component Value Date   WBC 6.6 02/27/2016   HGB 14.2 02/27/2016   HCT 42.9 02/27/2016   MCV 91.9 02/27/2016   PLT 248 02/27/2016      Component Value Date/Time   NA 137 02/27/2016 0536   K 3.7 02/27/2016 0536   CL 103 02/27/2016 0536   CO2 28 02/27/2016 0536   GLUCOSE 251 (H) 02/27/2016 0536   BUN 8 02/27/2016 0536   CREATININE 0.71 02/27/2016 0536   CALCIUM 8.5 (L) 02/27/2016 0536   PROT 6.4 (L) 02/27/2016 0536   ALBUMIN 3.1 (L) 02/27/2016 0536   AST 14 (L) 02/27/2016 0536   ALT 16 (L) 02/27/2016 0536   ALKPHOS 72 02/27/2016 0536   BILITOT 0.8 02/27/2016 0536   GFRNONAA >60 02/27/2016 0536   GFRAA >60 02/27/2016 0536   Lab Results  Component Value Date   CHOL 121 02/27/2016   HDL 34 (L) 02/27/2016   LDLCALC 71 02/27/2016   TRIG 82 02/27/2016   CHOLHDL 3.6 02/27/2016   Lab Results  Component Value Date   HGBA1C 10.7 (H) 02/27/2016    ASSESSMENT AND PLAN  71 y.o. year old male  has a past medical history of  Diabetes mellitus without complication ([place]); Hypercholesteremia; Hypertension;  and Stroke ([place]) (02/2016). here For  follow-up. MRI of his brain showed acute right ischemic thalamic infarction.Marland KitchenCTA head/neck - No significant large vessel finding. High grade stenosis R P4 2D Echo EF 55-60% LDL 71 hemoglobin A1c of 8.5. The patient is a current patient of Dr. [name]  who is out of the office today . This note is sent to the work in doctor.      PLAN:  Stressed the importance of management of risk factors to prevent further stroke Continue aspirin for secondary stroke prevention Maintain strict control of hypertension with blood pressure goal below 130/90, today's reading 102/58. continue antihypertensive medications and f/u with cardiology Stay well hydrated Control of diabetes with hemoglobin A1c below7 followed by endocrinology  most recent hemoglobin A1c8.5  continue diabetic medications  Cholesterol with LDL cholesterol less than 70, followed by primary care,  continue Lipitor Exercise by walking, slowly increase , eat  healthy diet with whole grains,  fresh fruits and vegetables Follow-up in 6 months I spent 25 min  in total face to face time with the patient more than 50% of which was spent counseling and coordination of care, reviewing test results reviewing medications and discussing and reviewing the diagnosis of stroke and prevention of another stroke by monitoring risk factors. , Rayburn Ma, [facility], APRN  [facility] [address] [place], [place] [address] [phone]

## 2017-01-30 ENCOUNTER — Ambulatory Visit: Payer: Self-pay | Admitting: Pharmacist

## 2017-01-30 NOTE — Progress Notes (Signed)
I have reviewed and agreed above plan. 

## 2017-02-01 ENCOUNTER — Other Ambulatory Visit: Payer: Self-pay | Admitting: Pharmacist

## 2017-02-08 NOTE — Patient Outreach (Addendum)
[place] [facility]) Care Management  02/01/2017  Mark Stewart April 24, 1946 [id number]   71year old male referred to [place] for medication management due to uncontrolled type 2 diabetes and hypertension. Received patient CBG and Blood Pressure log from patient today.    Current hypertension medications: amlodipine/valsartan/HCTZ 10/320/25 mg 1/2 tablet twice daily, carvedilol 50 mg twice daily, clonidine 0.15 mg twice daily  Current diabetes medications: Jardiance 25 mg daily, Novolog sliding scale (patient not taking), Levemir 26 units daily  Patient denies hypoglycemic episodes with CBGs <70 mg/dL but does state he had a few CBGs in 70s because he didn't eat very much the night before. Denies dizziness/lightheadedness.  Home BP Readings (4/12-4/19; see scanned document for full results): 134/74, 147/75, 162/78, 141/73, 196/87, 146/71, 136/65, 164/83 Home BP readings are prior to medications   Home HR Readings (4/12-4/19): 67, 64, 65, 65, 59, 64, 64, 66  Home CBG Readings (4/12-4/19): Fasting 180, 166, 104, 118, 75, 97, 207, 195, 81 mg/dL   Plan Will notify cardiologist and endocrinologist of blood pressure and CBG results Will followup with patient in 4-6 weeks for updated blood pressure and CBG values  [name], PharmD, [place] PGY2 Pharmacy Resident [phone]  [facility] CM Care Plan Problem One     Most Recent Value  Care Plan Problem One  Knowledge deficit related to diabetes management/hypertension as evidenced by current A1C and elevated blood pressure  Role Documenting the Problem One  Clinical Pharmacist  Care Plan for Problem One  Active  THN CM Short Term Goal #1 (0-30 days)  Patient will check blood glucose at least once daily and blood pressure at least once daily and will send log to [facility] pharmacist every 2 weeks over the next 30 days  THN CM Short Term Goal #1 Start Date  02/01/17  Interventions for Short Term Goal #1  Discussed importance of  blood pressure and glucose control.  [facility] pharmacist plans to assess values and followup with appropriate providers for further medication management.

## 2017-02-11 ENCOUNTER — Telehealth: Payer: Self-pay | Admitting: Nurse Practitioner

## 2017-02-11 NOTE — Telephone Encounter (Signed)
Discussed most recent visit of 01/29/2017 with Dr. [name]. CTA of the head and neck no significant large vessel finding but high-grade stenosis R P4 , 2 D. Dr. [name]  states there is no reason for follow-up CTA. Patient will be discharged at his next visit

## 2017-02-22 DIAGNOSIS — I10 Essential (primary) hypertension: Secondary | ICD-10-CM | POA: Diagnosis not present

## 2017-02-22 DIAGNOSIS — Z7901 Long term (current) use of anticoagulants: Secondary | ICD-10-CM | POA: Diagnosis not present

## 2017-02-22 DIAGNOSIS — R040 Epistaxis: Secondary | ICD-10-CM | POA: Diagnosis not present

## 2017-03-07 DIAGNOSIS — F331 Major depressive disorder, recurrent, moderate: Secondary | ICD-10-CM | POA: Diagnosis not present

## 2017-03-11 DIAGNOSIS — I10 Essential (primary) hypertension: Secondary | ICD-10-CM | POA: Diagnosis not present

## 2017-03-11 DIAGNOSIS — E118 Type 2 diabetes mellitus with unspecified complications: Secondary | ICD-10-CM | POA: Diagnosis not present

## 2017-03-11 DIAGNOSIS — E784 Other hyperlipidemia: Secondary | ICD-10-CM | POA: Diagnosis not present

## 2017-03-11 DIAGNOSIS — Z794 Long term (current) use of insulin: Secondary | ICD-10-CM | POA: Diagnosis not present

## 2017-03-22 DIAGNOSIS — R040 Epistaxis: Secondary | ICD-10-CM | POA: Diagnosis not present

## 2017-03-22 DIAGNOSIS — Z7901 Long term (current) use of anticoagulants: Secondary | ICD-10-CM | POA: Diagnosis not present

## 2017-04-03 DIAGNOSIS — M545 Low back pain: Secondary | ICD-10-CM | POA: Diagnosis not present

## 2017-04-05 DIAGNOSIS — R040 Epistaxis: Secondary | ICD-10-CM | POA: Diagnosis not present

## 2017-04-09 DIAGNOSIS — M545 Low back pain: Secondary | ICD-10-CM | POA: Diagnosis not present

## 2017-04-12 ENCOUNTER — Other Ambulatory Visit: Payer: Self-pay | Admitting: Pharmacist

## 2017-04-12 NOTE — Patient Outreach (Signed)
[facility] [facility]) Care Management  04/12/2017  Mark Stewart 15-Sep-1946 [id number]   71 y.o. year old male referred to [place] for Medication Management (Pharmacy Telephone Followup)   Was unable to reach patient via telephone today and have left HIPAA compliant voicemail asking patient to return my call (unsuccessful outreach #1).  Plan: Will followup within 7 days via telephone  [name], PharmD, [place] PGY2 Pharmacy Resident [phone]

## 2017-04-16 ENCOUNTER — Other Ambulatory Visit: Payer: Self-pay | Admitting: Pharmacist

## 2017-04-16 NOTE — Patient Outreach (Signed)
[facility]) Care Management  [place]   04/16/2017  Mark Stewart 1946-02-16 [id number]  Subjective: 71 y.o. year old male referred to [place] for Medication Management (Pharmacy Visit/Case Closure) Called patient today.  Patient states he ran out of Jardiance 2-3 days ago but plans on picking it up in the next day or two.  Patient reports he was having some back pain and Dr Gaetano Net gave him prednisone for 3 days.  Patient reprots he has followup with Cardiology on 7/30, his primary care on 8/16 and endocrinology on 07/12/17.  Patient reports his fasting CBGs have been 100-110 mg/dL.  Patient denies signs/symtoms of hypoglycemia and reports proper treatment knowledge.    Patient states he is doing fine and states he is aware to call [facility] for any future needs.   Objective:   Encounter Medications: Outpatient Encounter Prescriptions as of 04/16/2017  Medication Sig Note  . Amlodipine-Valsartan-HCTZ 10-320-25 MG TABS Take 0.5 tablets by mouth 2 (two) times daily.   Marland Kitchen aspirin EC 325 MG tablet Take 1 tablet (325 mg total) by mouth daily.   Marland Kitchen atorvastatin (LIPITOR) 80 MG tablet Take 80 mg by mouth daily.   Marland Kitchen buPROPion (WELLBUTRIN SR) 100 MG 12 hr tablet Take 100 mg by mouth daily.   . carvedilol (COREG) 25 MG tablet Take 2 tablets (50 mg total) by mouth 2 (two) times daily.   . cloNIDine (CATAPRES) 0.1 MG tablet Take 0.15 mg by mouth 2 (two) times daily.    . fluticasone (FLONASE) 50 MCG/ACT nasal spray Place 2 sprays into both nostrils daily as needed for allergies or rhinitis. Reported on 04/26/2016   . insulin degludec (TRESIBA) 100 UNIT/ML SOPN FlexTouch Pen Inject 26 Units into the skin daily.   . metFORMIN (GLUCOPHAGE) 1000 MG tablet Take 1,000 mg by mouth 2 (two) times daily with a meal.   . empagliflozin (JARDIANCE) 25 MG TABS tablet Take 25 mg by mouth daily.  04/16/2017: Ran out 2-3 days ago  . [DISCONTINUED] insulin aspart (NOVOLOG) 100 UNIT/ML  injection Inject 0-12 Units into the skin 3 (three) times daily before meals. Per Sliding Scale  BG < 200: 0 units BG 201-250: 2 units; BG 251-300: 4 units; BG 301-350: 6 units; BG 351-400: 8 units; BG 401-450: 10 units; BG > 451: 12 units 01/29/2017: 01/29/17 as needed  . [DISCONTINUED] insulin detemir (LEVEMIR) 100 UNIT/ML injection Inject 26 Units into the skin at bedtime. If fasting blood sugars > 120 for 3 days, increase Levemir by 2 units.  May repeat as needed.    No facility-administered encounter medications on file as of 04/16/2017.     Functional Status: In your present state of health, do you have any difficulty performing the following activities: 10/09/2016 06/06/2016  Hearing? - N  Vision? - N  Difficulty concentrating or making decisions? - N  Walking or climbing stairs? - N  Dressing or bathing? - N  Doing errands, shopping? - N  Conservation officer, nature and eating ? N -  Using the Toilet? N -  In the past six months, have you accidently leaked urine? N -  Do you have problems with loss of bowel control? N -  Managing your Medications? N -  Managing your Finances? N -  Housekeeping or managing your Housekeeping? N -  Some recent data might be hidden    Fall/Depression Screening: Fall Risk  01/29/2017 11/13/2016 10/24/2016  Falls in the past year? No No No   PHQ 2/9  Scores 11/13/2016 06/18/2016 06/06/2016 06/06/2016 05/02/2016 04/30/2016  PHQ - 2 Score _0 0 0 1  PHQ- 9 Score _1 - - -   Assessment: Drugs sorted by system: Neurologic/Psychologic: bupropion  Cardiovascular: aspirin, carvedilol, amlodipine/valsartan/HCTZ, clonidine, atorvastatin  Endocrine: [place], Metformin  Plan: Reviewed medications with patient today.  Patient reports adherence to regimen and denies complaints Discussed signs/symptoms/treatment of hypoglycemia Encouraged patient to continue taking medications as prescribed and continue checking CBGs and home blood pressure and write the  values down [place] will close case.  Please reconsult if needed  [name], PharmD, [place] PGY2 Pharmacy Resident [phone]  [facility] CM Care Plan Problem One     Most Recent Value  Care Plan Problem One  Knowledge deficit related to diabetes management/hypertension as evidenced by current A1C and elevated blood pressure  Role Documenting the Problem One  Clinical Pharmacist  Care Plan for Problem One  Active  THN CM Short Term Goal #1   Patient will check blood glucose at least once daily and blood pressure at least once daily and will send log to [facility] pharmacist every 2 weeks over the next 30 days  THN CM Short Term Goal #1 Start Date  02/01/17  [facility] CM Short Term Goal #1 Met Date  04/16/17  Interventions for Short Term Goal #1  Discussed importance of blood pressure and glucose control.  [facility] pharmacist plans to assess values and followup with appropriate providers for further medication management.

## 2017-05-06 ENCOUNTER — Encounter: Payer: Self-pay | Admitting: Cardiovascular Disease

## 2017-05-06 ENCOUNTER — Ambulatory Visit (INDEPENDENT_AMBULATORY_CARE_PROVIDER_SITE_OTHER): Payer: PPO | Admitting: Cardiovascular Disease

## 2017-05-06 VITALS — BP 108/62 | HR 57 | Ht 72.0 in | Wt 189.0 lb

## 2017-05-06 DIAGNOSIS — I10 Essential (primary) hypertension: Secondary | ICD-10-CM

## 2017-05-06 MED ORDER — CLONIDINE HCL 0.1 MG/24HR TD PTWK: 0.1000 mg | MEDICATED_PATCH | TRANSDERMAL | 12 refills | Status: DC

## 2017-05-06 NOTE — Patient Instructions (Signed)
Dr Sallyanne Kuster has recommended making the following medication changes: 1. STOP oral Clonidine 2. START Catapres patch - place 1 patch weekly  Your physician has requested that you regularly monitor and record your blood pressure readings at home. Please use the same machine to check your readings and record them to bring to your follow-up visit.  Your physician recommends that you schedule a follow-up appointment in 1-2 weeks with clinical pharmacist.  Dr Sallyanne Kuster recommends that you schedule a follow-up appointment first available.  If you need a refill on your cardiac medications before your next appointment, please call your pharmacy.

## 2017-05-06 NOTE — Progress Notes (Signed)
Cardiology Office Note:    Date:  05/06/2017   ID:  Mark Stewart, DOB September 04, 1946, MRN [id number]  PCP:  Algis Greenhouse, MD  Cardiologist:  Sanda Klein, MD    Referring MD: Algis Greenhouse, MD   Chief Complaint  Patient presents with  . Follow-up    NO chest pain, shortness of breath, no edema, occassional cramping in legs at night, lightheaded or dizziness    History of Present Illness:    Mark Stewart is a 71 y.o. male with a hx of Long-standing hypertension (over 20 years), insulin requiring type 2 diabetes mellitus, hyperlipidemia, right thalamic ischemic stroke in May 2017 reason [place] Holter for evaluation of hypertension therapy.  Blood pressure control has been fairly erratic. Typical blood pressure at home is mildly elevated in the 140s/80s. Systolic blood pressure as high as 175 in the 183 mmHg has been recorded. Diastolic blood pressure is generally in the mid 70s-mid 80s. Heart rate is consistently in the 60-70 range. The lowest blood pressure he has recorded at home in the last 3 weeks was 122/70. Reports that his home blood pressure cuff monitor has been checked in a doctor's office and found to be accurate.  In the office today his blood pressure was substantially lower than what he has recorded at home. Initially checked 108/62, rechecked 15 minutes later at 112/74. ECG confirmed heart rate of 57 bpm.  His wife speaks up for Mark Stewart, reporting that she has noticed a major change in his personality since his stroke a year ago. He lacks motivation and initiative. He has to be forced to leave the house. He does not perform any type of physical exercise if he can avoid it. He is often sleepy and takes naps during the day. She believes he has depression. He was started on bupropion about 3-4 weeks ago and initially felt mild improvement, currently no better.  He has severe hypertension and takes 5 different agents for this. For them are in maximum usual doses (amlodipine 10 mg,  valsartan 320 mg, carvedilol 50 mg twice daily, hydrochlorothiazide 25 mg daily). In addition he takes clonidine 0.1 mg tablets, 1-1/2 tablets twice a day. He is relatively lean. He takes a combination of Tresiba, Vania Rea and metformin for diabetes.  The patient specifically denies any chest pain at rest exertion, dyspnea at rest or with exertion, orthopnea, paroxysmal nocturnal dyspnea, syncope, palpitations, focal neurological deficits, intermittent claudication, lower extremity edema, unexplained weight gain, cough, hemoptysis or wheezing.  From a neurological exam point of view, his wife states that he sometimes will drag his left foot and cannot move fast. Otherwise he has recovered function well.  Past Medical History:  Diagnosis Date  . Allergy   . Cataract    history of surgery bilaterally  . Diabetes mellitus without complication ([place])   . Hypercholesteremia   . Hypertension   . 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 Stroke [facility]) 02/2016    Past Surgical History:  Procedure Laterality Date  . EYE SURGERY    . PROSTATE SURGERY    . SPLENECTOMY      Current Medications: Current Meds  Medication Sig  . Amlodipine-Valsartan-HCTZ 10-320-25 MG TABS Take 0.5 tablets by mouth 2 (two) times daily.  Marland Kitchen aspirin EC 325 MG tablet Take 1 tablet (325 mg total) by mouth daily.  Marland Kitchen atorvastatin (LIPITOR) 80 MG tablet Take 80 mg by mouth daily.  Marland Kitchen buPROPion (WELLBUTRIN SR) 100  MG 12 hr tablet Take 100 mg by mouth daily.  . carvedilol (COREG) 25 MG tablet Take 2 tablets (50 mg total) by mouth 2 (two) times daily.  . empagliflozin (JARDIANCE) 25 MG TABS tablet Take 25 mg by mouth daily.   . fluticasone (FLONASE) 50 MCG/ACT nasal spray Place 2 sprays into both nostrils daily as needed for allergies or rhinitis. Reported on 04/26/2016  . insulin degludec (TRESIBA) 100 UNIT/ML SOPN FlexTouch Pen Inject 26 Units into the skin daily.  . metFORMIN (GLUCOPHAGE) 1000 MG  tablet Take 1,000 mg by mouth 2 (two) times daily with a meal.  . [DISCONTINUED] cloNIDine (CATAPRES) 0.1 MG tablet Take 0.15 mg by mouth 2 (two) times daily.      Allergies:   Benazepril and Penicillins   Social History   Social History  . Marital status: Married    Spouse name: Mark Stewart  . Number of children: N/A  . Years of education: N/A   Social History Main Topics  . Smoking status: Former Smoker    Packs/day: 1.00    Years: 20.00  . Smokeless tobacco: Never Used     Comment: patient states last cigarette 35 years ago  . Alcohol use No  . Drug use: No  . Sexual activity: Not Asked   Other Topics Concern  . None   Social History Narrative  . None     Family History: The patient's family history includes Breast cancer in his mother and sister; Diabetes Mellitus II in his father. ROS:   Please see the history of present illness.     All other systems reviewed and are negative.  EKGs/Labs/Other Studies Reviewed:    MRI from 02/26/2016 shows an acute/subacute ischemic infarct involving the right thalamus, also mild chronic small vessel ischemic disease Echo from 02/27/2016 shows a mildly dilated left ventricle (LVEDD 55 mm may actually be normal), normal EF 55-60 percent, abnormal relaxation, left atrium 39 mm  EKG:  EKG is ordered today.  The ekg ordered today demonstrates Normal sinus rhythm, generalized flat T waves, otherwise normal.  Recent Labs: No results found for requested labs within last 8760 hours.  Recent Lipid Panel    Component Value Date/Time   CHOL 121 02/27/2016 0536   TRIG 82 02/27/2016 0536   HDL 34 (L) 02/27/2016 0536   CHOLHDL 3.6 02/27/2016 0536   VLDL 16 02/27/2016 0536   LDLCALC 71 02/27/2016 0536    Physical Exam:    VS:  BP 108/62   Pulse (!) 57   Ht 6' (1.829 m)   Wt 189 lb (85.7 kg)   BMI 25.63 kg/m     Wt Readings from Last 3 Encounters:  05/06/17 189 lb (85.7 kg)  01/29/17 187 lb (84.8 kg)  12/25/16 180 lb (81.6 kg)       GEN:  Well nourished, well developed in no acute distress HEENT: Normal Except for very subtle droop of the left corner of his mouth  NECK: No JVD; No carotid bruits LYMPHATICS: No lymphadenopathy CARDIAC: RRR, no murmurs, rubs, gallops RESPIRATORY:  Clear to auscultation without rales, wheezing or rhonchi  ABDOMEN: Soft, non-tender, non-distended MUSCULOSKELETAL:  No edema; No deformity  SKIN: Warm and dry NEUROLOGIC:  Alert and oriented x 3, cannot detect significant weakness by exam  PSYCHIATRIC:  Normal affect   ASSESSMENT:    1. Uncontrolled hypertension    PLAN:    In order of problems listed above:  1. HTN: His blood pressure substantially lower in the  office than recorded at home, even though reportedly his home blood pressure cuff has been confirmed as accurate. Many of the complaints described by his wife are consistent with side effects of clonidine. It's possible that his erratic blood pressure control is due to the short acting effect of clonidine. We'll try to discontinue this medication. Switch to Catapres-TTS-1 patch. Reevaluate his blood pressure in about a week's time. This sleepiness improves on this lower dose of medication, we'll continue it. Not a whole lot of other options for blood pressure management except adding minoxidil. 2. L hemiparesis: This appears to be mild and does not interfere with daily activities. I wonder if his change in behavior is related to long-term affects of the stroke, in addition to possible depression and side effects of clonidine. A neurologist would be better suited to confirm/deny this. 3. HLP: On high-dose statin, LDL at target 4. DM: On multiple meds, including insulin. I don't have his most recent glycohemoglobin.   Medication Adjustments/Labs and Tests Ordered: Current medicines are reviewed at length with the patient today.  Concerns regarding medicines are outlined above.  Orders Placed This Encounter  Procedures  . EKG  12-Lead   Meds ordered this encounter  Medications  . cloNIDine (CATAPRES-TTS-1) 0.1 mg/24hr patch    Sig: Place 1 patch (0.1 mg total) onto the skin once a week.    Dispense:  4 patch    Refill:  12    Signed, Sanda Klein, MD  05/06/2017 1:16 PM    [facility]

## 2017-05-09 ENCOUNTER — Telehealth: Payer: Self-pay | Admitting: Cardiovascular Disease

## 2017-05-09 NOTE — Telephone Encounter (Signed)
New message    Pt c/o medication issue:  1. Name of Medication: clonidine   2. How are you currently taking this medication (dosage and times per day)? One patch a week   3. Are you having a reaction (difficulty breathing--STAT)? Place under his ear  4. What is your medication issue? Pt isnt sure if patch is causing him to have tender spot under the ear

## 2017-05-10 DIAGNOSIS — R6884 Jaw pain: Secondary | ICD-10-CM | POA: Diagnosis not present

## 2017-05-10 NOTE — Telephone Encounter (Signed)
I spoke to patient yesterday (forgot to document). He was having lymph node enlargement in neck and wanted to know if this was a response to newly started clonidine patch. He reports he is putting patch on as instructed on upper arms. Reviewed w pharmD who advised very unlikely to be related. Kristin's instruction, which I relayed to patient, was to observe & notify PCP if unimproved after several days. Patient verbalized understanding and thanks.

## 2017-05-16 ENCOUNTER — Telehealth: Payer: Self-pay | Admitting: Pharmacist Clinician (PhC)/ Clinical Pharmacy Specialist

## 2017-05-16 ENCOUNTER — Ambulatory Visit (INDEPENDENT_AMBULATORY_CARE_PROVIDER_SITE_OTHER): Payer: PPO | Admitting: Pharmacist Clinician (PhC)/ Clinical Pharmacy Specialist

## 2017-05-16 DIAGNOSIS — I10 Essential (primary) hypertension: Secondary | ICD-10-CM

## 2017-05-16 NOTE — Assessment & Plan Note (Signed)
Patient with uncontrolled hypertension now on 5 meds (4 at max doses).  He just started the clonidine patches last week.  While he is apparently compliant with his medications, he is out eating his medications with salt and poor choices.  I am not going to make any medication changes today, despite his reading.  I think he was a little un-nerved driving up from [place], and he admits to checking his home BP "on the run".  He was given specific instructions on home blood pressure checks, including resting for at least 5 minutes and sitting comfortably.   He was encouraged to try eating at home 1-2 nights a week (at least, to start) and making better choices about vegetables over potatoes/fries/rice.   He was also encouraged to walk 10-15 minutes most days at home.  Will see him back in 2 weeks to determine accuracy of home readings and verify cuff.  At that time if he is still hypertensive, we can consider increasing clonidine patch to 0.2 mg dose.

## 2017-05-16 NOTE — Progress Notes (Signed)
05/16/2017 Mark Stewart December 29, 1945 [id number]   HPI:  Mark Stewart is a 71 y.o. male patient of Dr Sallyanne Kuster, with a PMH below who presents today for hypertension clinic evaluation.  He has a 20+ year history of hypertension and is currently on 5 medications, with 4 of those at maximum doses.  When he saw Dr. Sallyanne Kuster about 10 days ago, he was switched from clonidine tablets to the 0.1 mg patch in hopes of regulating his readings.     His other medical history is significant for CVA, hyperlipidemia and poorly controlled DM2.  His last A1c in Epic just 2 months ago and was 9.3  He is here alone today and admits this is the first time his wife has let him drive to [facility] since his stroke last year (May 2017).  She was not able to come with him today because of some recent surgery, but he made several comments about her nagging him about exercise and eating better (although she apparently doesn't do either herself).      Blood Pressure Goal:  130/90  Current Medications:  Amlodipine/valsartan/hctz 10/320/25 mg (1/2 tablet twice daily)  Carvedilol 50 mg twice daily  Clonidine 0.1 mg patch, each Monday  Family Hx:  f - 81 diabetes complications  m - 57 cancer  2 sisters, 2 children, no hypertension  Social Hx:  No tobacco, quit in 81; no alcohol; diet Coke- about 1 liter per day; some black coffee in am, un-sweet tea with sweet N low  Diet:  Eat out twice daily (lunch and dinner) almost every day, some sit down restaurants and sandwich shops, but mostly fast food - usually dinner every night at [employer] or [employer] (with milkshakes).  States he doesn't add salt to his food, but admits that he probably gets too much in just eating out.  Does eat breakfast at home, usually honey nut Cheerios.   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  Does not walk, although his wife nags him to get exercise.  Home BP readings:  Home average for last 9 days (since Dr. Loletha Grayer appointment) was 164/82 with a range of 132-167/76-89.  Heart rate consistent from 63-72.    Intolerances:   ACEI causes cough  Wt Readings from Last 3 Encounters:  05/06/17 189 lb (85.7 kg)  01/29/17 187 lb (84.8 kg)  12/25/16 180 lb (81.6 kg)   BP Readings from Last 3 Encounters:  05/16/17 (!) 176/88  05/06/17 108/62  01/29/17 (!) 102/58   Pulse Readings from Last 3 Encounters:  05/16/17 64  05/06/17 (!) 57  01/29/17 65    Current Outpatient Prescriptions  Medication Sig Dispense Refill  . Amlodipine-Valsartan-HCTZ 10-320-25 MG TABS Take 0.5 tablets by mouth 2 (two) times daily. 30 tablet 6  . aspirin EC 325 MG tablet Take 1 tablet (325 mg total) by mouth daily. 30 tablet 0  . atorvastatin (LIPITOR) 80 MG tablet Take 80 mg by mouth daily.    Marland Kitchen buPROPion (WELLBUTRIN SR) 100 MG 12 hr tablet Take 100 mg by mouth daily.    . carvedilol (COREG) 25 MG tablet Take 2 tablets (50 mg total) by mouth 2 (two) times daily. 120 tablet 11  . cloNIDine (CATAPRES-TTS-1)  0.1 mg/24hr patch Place 1 patch (0.1 mg total) onto the skin once a week. 4 patch 12  . empagliflozin (JARDIANCE) 25 MG TABS tablet Take 25 mg by mouth daily.     . fluticasone (FLONASE) 50 MCG/ACT nasal spray Place 2 sprays into both nostrils daily as needed for allergies or rhinitis. Reported on 04/26/2016    . insulin degludec (TRESIBA) 100 UNIT/ML SOPN FlexTouch Pen Inject 26 Units into the skin daily.    . metFORMIN (GLUCOPHAGE) 1000 MG tablet Take 1,000 mg by mouth 2 (two) times daily with a meal.     No current facility-administered medications for this visit.     Allergies  Allergen Reactions  . Benazepril Cough  . Penicillins Rash    Has patient had a PCN reaction causing immediate rash, facial/tongue/throat swelling, SOB or lightheadedness  with hypotension: yes Has patient had a PCN reaction causing severe rash involving mucus membranes or skin necrosis: no Has patient had a PCN reaction that required hospitalization: no Has patient had a PCN reaction occurring within the last 10 years: no If all of the above answers are "NO", then may proceed with Cephalosporin use.     Past Medical History:  Diagnosis Date  . Allergy   . Cataract    history of surgery bilaterally  . Diabetes mellitus without complication ([place])   . Hypercholesteremia   . Hypertension   . 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 Stroke ([place]) 02/2016    Blood pressure (!) 176/88, pulse 64.  Uncontrolled hypertension Patient with uncontrolled hypertension now on 5 meds (4 at max doses).  He just started the clonidine patches last week.  While he is apparently compliant with his medications, he is out eating his medications with salt and poor choices.  I am not going to make any medication changes today, despite his reading.  I think he was a little un-nerved driving up from [place], and he admits to checking his home BP "on the run".  He was given specific instructions on home blood pressure checks, including resting for at least 5 minutes and sitting comfortably.   He was encouraged to try eating at home 1-2 nights a week (at least, to start) and making better choices about vegetables over potatoes/fries/rice.   He was also encouraged to walk 10-15 minutes most days at home.  Will see him back in 2 weeks to determine accuracy of home readings and verify cuff.  At that time if he is still hypertensive, we can consider increasing clonidine patch to 0.2 mg dose.      Tommy Medal PharmD CPP [facility]

## 2017-05-16 NOTE — Telephone Encounter (Signed)
Updated med list to include meloxicam

## 2017-05-16 NOTE — Patient Instructions (Addendum)
Return for a a follow up appointment in 3 weeks  Your blood pressure today is 176/88 - goal is around 130/80  Check your blood pressure at home daily and keep record of the readings.  Take your BP meds as follows:  Continue with your current medications  Bring all of your meds, your BP cuff and your record of home blood pressures to your next appointment.  Exercise as you're able, try to walk approximately 30 minutes per day.  Keep salt intake to a minimum, especially watch canned and prepared boxed foods.  Eat more fresh fruits and vegetables and fewer canned items.  Avoid eating in fast food restaurants.    HOW TO TAKE YOUR BLOOD PRESSURE: . Rest 5 minutes before taking your blood pressure. .  Don't smoke or drink caffeinated beverages for at least 30 minutes before. . Take your blood pressure before (not after) you eat. . Sit comfortably with your back supported and both feet on the floor (don't cross your legs). . Elevate your arm to heart level on a table or a desk. . Use the proper sized cuff. It should fit smoothly and snugly around your bare upper arm. There should be enough room to slip a fingertip under the cuff. The bottom edge of the cuff should be 1 inch above the crease of the elbow. . Ideally, take 3 measurements at one sitting and record the average.

## 2017-05-16 NOTE — Progress Notes (Signed)
Thanks MCr 

## 2017-05-24 DIAGNOSIS — K1121 Acute sialoadenitis: Secondary | ICD-10-CM | POA: Diagnosis not present

## 2017-05-24 DIAGNOSIS — F331 Major depressive disorder, recurrent, moderate: Secondary | ICD-10-CM | POA: Diagnosis not present

## 2017-05-30 ENCOUNTER — Encounter: Payer: Self-pay | Admitting: Pharmacist Clinician (PhC)/ Clinical Pharmacy Specialist

## 2017-05-30 ENCOUNTER — Ambulatory Visit (INDEPENDENT_AMBULATORY_CARE_PROVIDER_SITE_OTHER): Payer: PPO | Admitting: Pharmacist Clinician (PhC)/ Clinical Pharmacy Specialist

## 2017-05-30 DIAGNOSIS — I10 Essential (primary) hypertension: Secondary | ICD-10-CM

## 2017-05-30 MED ORDER — CLONIDINE HCL 0.2 MG/24HR TD PTWK: 0.2000 mg | MEDICATED_PATCH | TRANSDERMAL | 12 refills | Status: DC

## 2017-05-30 NOTE — Patient Instructions (Signed)
Return for a a follow up appointment in 3-4 weeks    Your blood pressure today is 138/78     Check your blood pressure at home daily and keep record of the readings.  Take your BP meds as follows:  Increase your clonidine patch to 0.2 mg.  Change every Wednesday evening when you go to [place].  Continue all other medications  Bring all of your meds, your BP cuff and your record of home blood pressures to your next appointment.  Exercise as you're able, try to walk approximately 30 minutes per day.  Keep salt intake to a minimum, especially watch canned and prepared boxed foods.  Eat more fresh fruits and vegetables and fewer canned items.  Avoid eating in fast food restaurants.    HOW TO TAKE YOUR BLOOD PRESSURE: . Rest 5 minutes before taking your blood pressure. .  Don't smoke or drink caffeinated beverages for at least 30 minutes before. . Take your blood pressure before (not after) you eat. . Sit comfortably with your back supported and both feet on the floor (don't cross your legs). . Elevate your arm to heart level on a table or a desk. . Use the proper sized cuff. It should fit smoothly and snugly around your bare upper arm. There should be enough room to slip a fingertip under the cuff. The bottom edge of the cuff should be 1 inch above the crease of the elbow. . Ideally, take 3 measurements at one sitting and record the average.

## 2017-05-30 NOTE — Assessment & Plan Note (Signed)
Patient with uncontrolled blood pressure and on maximum doses of 4 medications.  He has no complaints about his clonidine, so for now will increase that dose from 0.1 mg patch to .02 mg.  He will change this every Wednesday night before going to church, to help him remember.   We will see him back in a month for follow up.  Explained the need to take his BP correctly, and continue working on better eating habits.

## 2017-05-30 NOTE — Progress Notes (Signed)
05/30/2017 Atiba Kimberlin Mclester 16-Oct-1945 [id number]   HPI:  MELBOURNE JAKUBIAK is a 71 y.o. male patient of Dr Sallyanne Kuster, with a PMH below who presents today for hypertension clinic follow up.  At his last visit, although his pressure was still elevated, I did not make any medication changes.  He has a 20+ year history of hypertension and is currently on 5 medications, with 4 of those at maximum doses.  He had some home BP readings with him, most of which were elevated, but he was taking his pressure "on the run" with improper technique.   We spent some time talking about sitting in a quiet environment and not eating within 30 minutes.  I asked that he take his pressure twice daily for 2 weeks then return for a follow up with more accurate readings.  He admits to poor compliance with the clonidine patches, stating that he usually remembers to change them every 8-10 days.  In addition to hypertension, his medical history is significant for CVA, hyperlipidemia and poorly controlled DM2.  His last A1c in Epic just 2 months ago and was 9.3  He is again, here alone, and was about 20 minutes late because he got lost once getting to [place].    Today he returns with a continued list of home BP readings.  He only took once daily, and admits he continued to just put the cuff on and check, without waiting or necessarily sitting down.  Interestingly, for about 6 days, he checked his pressure a second time, about 2 minutes after the first, and those readings were on average 9/2 points lower than his first readings.  He did not bring his cuff with him today, but saw his PCP, Dr. [name], in [place] and he says the home meter tested accurate at that office.      Blood Pressure Goal:  130/90  Current Medications:  Amlodipine/valsartan/hctz 10/320/25 mg (1/2 tablet twice daily)  Carvedilol 50 mg twice daily  Clonidine 0.1 mg patch, each Monday  Family Hx:  f - 81 diabetes complications  m - 57 cancer  2  sisters, 2 children, no hypertension  Social Hx:  No tobacco, quit in '81; no alcohol; diet Coke- about 1 liter per day; some black coffee in am, un-sweet tea with sweet N low  Diet:  Eat out twice daily (lunch and dinner) almost every day, some sit down restaurants and sandwich shops, but mostly fast food - usually dinner every night at [employer] or [employer] (with milkshakes).  States he doesn't add salt to his food, but admits that he probably gets too much in just eating out.  Does eat breakfast at home, usually honey nut Cheerios.   Drinks diet Coke, more than a liter per day, tea with Sweet n Low regularly, and 1 cup of coffee each morning.  After his last visit I asked that he make an attempt to eat fewer fast foods, and he states that he is eating out the same, but has tried to eat more grilled sandwiches.    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  Does not walk, although his wife nags him to get exercise.  Home BP readings:  Home average since he was last here remains essentially unchanged at 166/90.  However when we look at just the 6 days when he took a second reading  2-3 minutes after the first, the average systolic of those 6 is 956.    Intolerances:   ACEI causes cough  Wt Readings from Last 3 Encounters:  05/06/17 189 lb (85.7 kg)  01/29/17 187 lb (84.8 kg)  12/25/16 180 lb (81.6 kg)   BP Readings from Last 3 Encounters:  05/30/17 138/72  05/16/17 (!) 176/88  05/06/17 108/62   Pulse Readings from Last 3 Encounters:  05/30/17 72  05/16/17 64  05/06/17 (!) 57    Current Outpatient Prescriptions  Medication Sig Dispense Refill  . Amlodipine-Valsartan-HCTZ 10-320-25 MG TABS Take 0.5 tablets by mouth 2 (two) times daily. 30 tablet 6  . aspirin EC 325 MG tablet Take 1 tablet (325 mg total) by mouth daily. 30 tablet 0  . atorvastatin (LIPITOR) 80 MG tablet Take 80 mg by mouth daily.      Marland Kitchen buPROPion (WELLBUTRIN SR) 100 MG 12 hr tablet Take 100 mg by mouth daily.    . carvedilol (COREG) 25 MG tablet Take 2 tablets (50 mg total) by mouth 2 (two) times daily. 120 tablet 11  . cloNIDine (CATAPRES - DOSED IN MG/24 HR) 0.2 mg/24hr patch Place 1 patch (0.2 mg total) onto the skin once a week. 4 patch 12  . empagliflozin (JARDIANCE) 25 MG TABS tablet Take 25 mg by mouth daily.     . fluticasone (FLONASE) 50 MCG/ACT nasal spray Place 2 sprays into both nostrils daily as needed for allergies or rhinitis. Reported on 04/26/2016    . insulin degludec (TRESIBA) 100 UNIT/ML SOPN FlexTouch Pen Inject 26 Units into the skin daily.    . meloxicam (MOBIC) 15 MG tablet Take 15 mg by mouth daily.    . metFORMIN (GLUCOPHAGE) 1000 MG tablet Take 1,000 mg by mouth 2 (two) times daily with a meal.     No current facility-administered medications for this visit.     Allergies  Allergen Reactions  . Benazepril Cough  . Penicillins Rash    Has patient had a PCN reaction causing immediate rash, facial/tongue/throat swelling, SOB or lightheadedness with hypotension: yes Has patient had a PCN reaction causing severe rash involving mucus membranes or skin necrosis: no Has patient had a PCN reaction that required hospitalization: no Has patient had a PCN reaction occurring within the last 10 years: no If all of the above answers are "NO", then may proceed with Cephalosporin use.     Past Medical History:  Diagnosis Date  . Allergy   . Cataract    history of surgery bilaterally  . Diabetes mellitus without complication ([place])   . Hypercholesteremia   . Hypertension   . 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 Stroke ([place]) 02/2016    Blood pressure 138/72, pulse 72.  Uncontrolled hypertension Patient with uncontrolled blood pressure and on maximum doses of 4 medications.  He has no complaints about his clonidine, so for now will increase that dose from 0.1 mg patch  to .02 mg.  He will change this every Wednesday night before going to church, to help him remember.   We will see him back in a month for follow up.  Explained the need to take his BP correctly, and continue working on better eating habits.     Tommy Medal PharmD CPP [facility]

## 2017-06-17 ENCOUNTER — Other Ambulatory Visit: Payer: Self-pay | Admitting: Student

## 2017-06-19 ENCOUNTER — Ambulatory Visit (INDEPENDENT_AMBULATORY_CARE_PROVIDER_SITE_OTHER): Payer: PPO | Admitting: Pharmacist

## 2017-06-19 VITALS — BP 128/60 | HR 68

## 2017-06-19 DIAGNOSIS — I10 Essential (primary) hypertension: Secondary | ICD-10-CM | POA: Diagnosis not present

## 2017-06-19 NOTE — Assessment & Plan Note (Signed)
Blood pressure at goal today and controlled at home as well. Home BP average dropped from an average of 166/90 to 145/78. Patient also denies problems with current therapy. Compliance with medication administration and instructions remain a problem but doing better now. Will continue current therapy without changes and follow up with cardiologist in 4 weeks as previously scehduled.

## 2017-06-19 NOTE — Progress Notes (Signed)
HPI:  Mark Stewart is a 71 y.o. male patient of Dr Sallyanne Kuster.  PMH includes history of CVA, hyperlipidemia, poorly controlled DM, and hypertension.  During most recent HTN follow up, 3 weeks ago, his clonidine patch was changed from 0.1mg  to 0.2mg . Patient was instructed to change the patch on Wednesday but he waited until Sunday to change; therefore, clonidine 0.2mg  path was initiated only 2 weeks ago. Patient was asked to take his pressure twice daily (morning and evening) for 2 weeks then return for a follow up, but he only measure BP in the mornings.  Patient did not bring his cuff with him today, but saw his PCP, Dr. [name], in [place] and he says the home meter tested accurate at that office. Denies dizziness, increased fatigue, chest pain, swelling or any other problems related to therapy.  Blood Pressure Goal:  130/90  Current Medications:  Amlodipine/valsartan/hctz 10/320/25 mg (1/2 tablet twice daily)  Carvedilol 50 mg twice daily  Clonidine 0.2 mg patch, each Wednesday  Intolerances:   ACEI causes cough  Family Hx:  fathe - 81 diabetes complications  Mother - 50 cancer  2 sisters, 2 children, no hypertension  Social Hx:  No tobacco, quit in '81; no alcohol; diet Coke- about 1 liter per day; some black coffee in am, un-sweet tea with sweet N low  Diet:  Eat out twice daily (lunch and dinner) almost every day, some sit down restaurants and sandwich shops, but mostly fast food - usually dinner every night at [employer] or [employer] (with milkshakes).  States he doesn't add salt to his food, but admits that he probably gets too much in just eating out.  Does eat breakfast at home, usually honey nut Cheerios.   Drinks diet Coke, more than a liter per day, tea with Sweet n Low regularly, and 1 cup of coffee each morning.  After his last visit I asked that he make an attempt to eat fewer fast foods, and he states that he is eating out the same, but has tried to eat more grilled  sandwiches.    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  Does not walk, although his wife nags him to get exercise.  Home BP readings:  17 readings; average 145/78 (pulse range 61-76 pbm)  Wt Readings from Last 3 Encounters:  05/06/17 189 lb (85.7 kg)  01/29/17 187 lb (84.8 kg)  12/25/16 180 lb (81.6 kg)   BP Readings from Last 3 Encounters:  06/19/17 128/60  05/30/17 138/72  05/16/17 (!) 176/88   Pulse Readings from Last 3 Encounters:  06/19/17 68  05/30/17 72  05/16/17 64    Current Outpatient Prescriptions  Medication Sig Dispense Refill  . Amlodipine-Valsartan-HCTZ 10-320-25 MG TABS TAKE 1/2 TABLET BY MOUTH 2 TIMES DAILY 30 tablet 10  . aspirin EC 325 MG tablet Take 1 tablet (325 mg total) by mouth daily. 30 tablet 0  . atorvastatin (LIPITOR) 80 MG tablet Take 80 mg by mouth daily.    Marland Kitchen buPROPion (WELLBUTRIN SR) 100 MG 12 hr tablet Take 100 mg by mouth daily.    . carvedilol (COREG) 25 MG tablet Take 2 tablets (50 mg total) by mouth 2 (two) times daily. 120 tablet 11  . cloNIDine (CATAPRES - DOSED IN MG/24 HR) 0.2 mg/24hr patch Place 1 patch (0.2 mg total) onto the skin once a week. 4  patch 12  . empagliflozin (JARDIANCE) 25 MG TABS tablet Take 25 mg by mouth daily.     . fluticasone (FLONASE) 50 MCG/ACT nasal spray Place 2 sprays into both nostrils daily as needed for allergies or rhinitis. Reported on 04/26/2016    . insulin degludec (TRESIBA) 100 UNIT/ML SOPN FlexTouch Pen Inject 26 Units into the skin daily.    . meloxicam (MOBIC) 15 MG tablet Take 15 mg by mouth daily.    . metFORMIN (GLUCOPHAGE) 1000 MG tablet Take 1,000 mg by mouth 2 (two) times daily with a meal.     No current facility-administered medications for this visit.     Allergies  Allergen Reactions  . Benazepril Cough  . Penicillins Rash    Has patient had a PCN reaction causing immediate rash,  facial/tongue/throat swelling, SOB or lightheadedness with hypotension: yes Has patient had a PCN reaction causing severe rash involving mucus membranes or skin necrosis: no Has patient had a PCN reaction that required hospitalization: no Has patient had a PCN reaction occurring within the last 10 years: no If all of the above answers are "NO", then may proceed with Cephalosporin use.     Past Medical History:  Diagnosis Date  . Allergy   . Cataract    history of surgery bilaterally  . Diabetes mellitus without complication ([place])   . Hypercholesteremia   . Hypertension   . 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 Stroke ([place]) 02/2016    Blood pressure 128/60, pulse 68.  Uncontrolled hypertension Blood pressure at goal today and controlled at home as well. Home BP average dropped from an average of 166/90 to 145/78. Patient also denies problems with current therapy. Compliance with medication administration and instructions remain a problem but doing better now. Will continue current therapy without changes and follow up with cardiologist in 4 weeks as previously scehduled.    Tierra Thoma Rodriguez-Guzman PharmD, BCPS, [place] [address] 06/19/2017 8:41 PM

## 2017-06-19 NOTE — Patient Instructions (Addendum)
Return for a follow up appointment in as needed  Your blood pressure today is 128/60 pulse 68  Check your blood pressure at home daily (if able) and keep record of the readings.  Take your BP meds as follows: *All medication as prescribed by PCP*   Bring your BP cuff and your record of home blood pressures to your next appointment.  Exercise as you're able, try to walk approximately 30 minutes per day.  Keep salt intake to a minimum, especially watch canned and prepared boxed foods.  Eat more fresh fruits and vegetables and fewer canned items.  Avoid eating in fast food restaurants.    HOW TO TAKE YOUR BLOOD PRESSURE: . Rest 5 minutes before taking your blood pressure. .  Don't smoke or drink caffeinated beverages for at least 30 minutes before. . Take your blood pressure before (not after) you eat. . Sit comfortably with your back supported and both feet on the floor (don't cross your legs). . Elevate your arm to heart level on a table or a desk. . Use the proper sized cuff. It should fit smoothly and snugly around your bare upper arm. There should be enough room to slip a fingertip under the cuff. The bottom edge of the cuff should be 1 inch above the crease of the elbow. . Ideally, take 3 measurements at one sitting and record the average.

## 2017-06-20 DIAGNOSIS — I10 Essential (primary) hypertension: Secondary | ICD-10-CM | POA: Diagnosis not present

## 2017-06-20 DIAGNOSIS — E118 Type 2 diabetes mellitus with unspecified complications: Secondary | ICD-10-CM | POA: Diagnosis not present

## 2017-06-20 DIAGNOSIS — E784 Other hyperlipidemia: Secondary | ICD-10-CM | POA: Diagnosis not present

## 2017-06-20 DIAGNOSIS — F331 Major depressive disorder, recurrent, moderate: Secondary | ICD-10-CM | POA: Diagnosis not present

## 2017-06-27 DIAGNOSIS — K1120 Sialoadenitis, unspecified: Secondary | ICD-10-CM | POA: Diagnosis not present

## 2017-06-27 DIAGNOSIS — K115 Sialolithiasis: Secondary | ICD-10-CM | POA: Diagnosis not present

## 2017-06-28 DIAGNOSIS — S93515A Sprain of interphalangeal joint of left lesser toe(s), initial encounter: Secondary | ICD-10-CM | POA: Diagnosis not present

## 2017-07-12 DIAGNOSIS — I10 Essential (primary) hypertension: Secondary | ICD-10-CM | POA: Diagnosis not present

## 2017-07-12 DIAGNOSIS — E118 Type 2 diabetes mellitus with unspecified complications: Secondary | ICD-10-CM | POA: Diagnosis not present

## 2017-07-12 DIAGNOSIS — Z794 Long term (current) use of insulin: Secondary | ICD-10-CM | POA: Diagnosis not present

## 2017-07-12 DIAGNOSIS — E7849 Other hyperlipidemia: Secondary | ICD-10-CM | POA: Diagnosis not present

## 2017-07-16 DIAGNOSIS — K1120 Sialoadenitis, unspecified: Secondary | ICD-10-CM | POA: Diagnosis not present

## 2017-07-16 DIAGNOSIS — R591 Generalized enlarged lymph nodes: Secondary | ICD-10-CM | POA: Diagnosis not present

## 2017-07-16 DIAGNOSIS — G501 Atypical facial pain: Secondary | ICD-10-CM | POA: Diagnosis not present

## 2017-07-19 ENCOUNTER — Encounter: Payer: Self-pay | Admitting: Cardiovascular Disease

## 2017-07-19 ENCOUNTER — Ambulatory Visit (INDEPENDENT_AMBULATORY_CARE_PROVIDER_SITE_OTHER): Payer: PPO | Admitting: Cardiovascular Disease

## 2017-07-19 VITALS — BP 136/74 | HR 68 | Ht 72.0 in | Wt 193.0 lb

## 2017-07-19 DIAGNOSIS — E1165 Type 2 diabetes mellitus with hyperglycemia: Secondary | ICD-10-CM | POA: Diagnosis not present

## 2017-07-19 DIAGNOSIS — E782 Mixed hyperlipidemia: Secondary | ICD-10-CM

## 2017-07-19 DIAGNOSIS — I10 Essential (primary) hypertension: Secondary | ICD-10-CM | POA: Diagnosis not present

## 2017-07-19 DIAGNOSIS — G8194 Hemiplegia, unspecified affecting left nondominant side: Secondary | ICD-10-CM | POA: Diagnosis not present

## 2017-07-19 DIAGNOSIS — Z794 Long term (current) use of insulin: Secondary | ICD-10-CM | POA: Diagnosis not present

## 2017-07-19 NOTE — Progress Notes (Signed)
Cardiology Office Note:    Date:  07/19/2017   ID:  Mark Stewart, DOB [birth year], MRN [id number]  PCP:  Mark Greenhouse, MD  Cardiologist:  Mark Klein, MD    Referring MD: Mark Greenhouse, MD   Chief Complaint  Patient presents with  . Follow-up  . Edema    Feet and ankles.    History of Present Illness:    Mark Stewart is a 71 y.o. male with a hx of Long-standing hypertension (over 20 years), insulin requiring type 2 diabetes mellitus, hyperlipidemia, right thalamic ischemic stroke in May 2017.  He denies problems with dyspnea or angina. He has occasional ankle and pedal edema, but this generally resolves after overnight recumbency. He has not had claudication or any new neurological events. He has no complaints, but has before his wife complains of his apathy and inability to follow-up on duties and tasks. He was "always a go-getter", but since his stroke he will not complete even simple tasks. When she asks him why he didn't do something his answer is universally "I don't know". His apathy has persisted despite titration of bupropion to higher doses.  She recorded with his home monitor shows mildly elevated systolic blood pressure usually in the 140s or low 150s. However when checked in the office his systolic blood pressure is in the 120s or low 130s. This has been a consistent pattern.  His wife speaks up for Mark Stewart, reporting that she has noticed a major change in his personality since his stroke a year ago. He lacks motivation and initiative. He has to be forced to leave the house. He does not perform any type of physical exercise if he can avoid it. He is often sleepy and takes naps during the day. She believes he has depression. He was started on bupropion about 3-4 weeks ago and initially felt mild improvement, currently no better.  Continues to take maximum doses of amlodipine, valsartan and carvedilol (50 mg twice daily) as well as hydrochlorothiazide and clonidine  patch.   Past Medical History:  Diagnosis Date  . Allergy   . Cataract    history of surgery bilaterally  . Diabetes mellitus without complication ([place])   . Hypercholesteremia   . Hypertension   . 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 Stroke [facility]) 02/2016    Past Surgical History:  Procedure Laterality Date  . EYE SURGERY    . PROSTATE SURGERY    . SPLENECTOMY      Current Medications: Current Meds  Medication Sig  . Amlodipine-Valsartan-HCTZ 10-320-25 MG TABS TAKE 1/2 TABLET BY MOUTH 2 TIMES DAILY  . aspirin EC 325 MG tablet Take 1 tablet (325 mg total) by mouth daily.  Marland Kitchen atorvastatin (LIPITOR) 80 MG tablet Take 80 mg by mouth daily.  Marland Kitchen buPROPion (WELLBUTRIN SR) 100 MG 12 hr tablet Take 100 mg by mouth daily.  . carvedilol (COREG) 25 MG tablet Take 2 tablets (50 mg total) by mouth 2 (two) times daily.  . cloNIDine (CATAPRES - DOSED IN MG/24 HR) 0.2 mg/24hr patch Place 1 patch (0.2 mg total) onto the skin once a week.  . empagliflozin (JARDIANCE) 25 MG TABS tablet Take 25 mg by mouth daily.   . fluticasone (FLONASE) 50 MCG/ACT nasal spray Place 2 sprays into both nostrils daily as needed for allergies or rhinitis. Reported on 04/26/2016  . insulin degludec (TRESIBA) 100 UNIT/ML SOPN FlexTouch Pen Inject 26 Units into the  skin daily.  . meloxicam (MOBIC) 15 MG tablet Take 15 mg by mouth daily.  . [DISCONTINUED] metFORMIN (GLUCOPHAGE) 1000 MG tablet Take 1,000 mg by mouth 2 (two) times daily with a meal.     Allergies:   Benazepril and Penicillins   Social History   Social History  . Marital status: Married    Spouse name: Mark Stewart  . Number of children: N/A  . Years of education: N/A   Social History Main Topics  . Smoking status: Former Smoker    Packs/day: 1.00    Years: 20.00  . Smokeless tobacco: Never Used     Comment: patient states last cigarette 35 years ago  . Alcohol use No  . Drug use: No  . Sexual activity: Not Asked    Other Topics Concern  . None   Social History Narrative  . None     Family History: The patient's family history includes Breast cancer in his mother and sister; Diabetes Mellitus II in his father. ROS:   Please see the history of present illness.     All other systems reviewed and are negative.  EKGs/Labs/Other Studies Reviewed:    MRI from 02/26/2016 shows an acute/subacute ischemic infarct involving the right thalamus, also mild chronic small vessel ischemic disease Echo from 02/27/2016 shows a mildly dilated left ventricle (LVEDD 55 mm may actually be normal), normal EF 55-60 percent, abnormal relaxation, left atrium 39 mm  EKG:  EKG is ordered today.  The ekg ordered today demonstrates Normal sinus rhythm, generalized flat T waves, otherwise normal.  Recent Labs: No results found for requested labs within last 8760 hours.  Recent Lipid Panel    Component Value Date/Time   CHOL 121 02/27/2016 0536   TRIG 82 02/27/2016 0536   HDL 34 (L) 02/27/2016 0536   CHOLHDL 3.6 02/27/2016 0536   VLDL 16 02/27/2016 0536   LDLCALC 71 02/27/2016 0536    Physical Exam:    VS:  BP 136/74   Pulse 68   Ht 6' (1.829 m)   Wt 193 lb (87.5 kg)   BMI 26.18 kg/m     Wt Readings from Last 3 Encounters:  07/19/17 193 lb (87.5 kg)  05/06/17 189 lb (85.7 kg)  01/29/17 187 lb (84.8 kg)     General: Alert, oriented x3, no distress, Appears comfortable and relaxed Head: no evidence of trauma, PERRL, EOMI, no exophtalmos or lid lag, no myxedema, no xanthelasma; normal ears, nose and oropharynx Neck: normal jugular venous pulsations and no hepatojugular reflux; brisk carotid pulses without delay and no carotid bruits Chest: clear to auscultation, no signs of consolidation by percussion or palpation, normal fremitus, symmetrical and full respiratory excursions Cardiovascular: normal position and quality of the apical impulse, regular rhythm, normal first and second heart sounds, no murmurs,  rubs or gallops Abdomen: no tenderness or distention, no masses by palpation, no abnormal pulsatility or arterial bruits, normal bowel sounds, no hepatosplenomegaly Extremities: no clubbing, cyanosis or edema; 2+ radial, ulnar and brachial pulses bilaterally; 2+ right femoral, posterior tibial and dorsalis pedis pulses; 2+ left femoral, posterior tibial and dorsalis pedis pulses; no subclavian or femoral bruits Neurological: grossly nonfocal Psych: Normal mood and affect  ASSESSMENT:    1. Essential hypertension   2. Left hemiparesis ([place])   3. Mixed hyperlipidemia   4. Type 2 diabetes mellitus with hyperglycemia, with long-term current use of insulin (HCC)    PLAN:    In order of problems listed above:  1. HTN:  I think his blood pressure control is adequate. I suspect that his home monitor may be overestimating his true blood pressure and advised him to have it rechecked in an office setting.Marland Kitchen No changes made to his current medications. 2. L hemiparesis: The motor residual effects of a stroke are very mild and do not interfere with activity, but he seems to have a fairly significant deterioration in executive skills and has apathy. I'm not sure how to help with this. I've advised him to discuss it with the neurology team. 3. HLP: Continue high-dose statin. Last lipid profile showed parameters at target 4. DM: On insulin therapy and SGLT 2 inhibitor   Medication Adjustments/Labs and Tests Ordered: Current medicines are reviewed at length with the patient today.  Concerns regarding medicines are outlined above.  No orders of the defined types were placed in this encounter.  No orders of the defined types were placed in this encounter.   Signed, Mark Klein, MD  07/19/2017 12:57 PM    [place]

## 2017-07-19 NOTE — Patient Instructions (Signed)
Dr Sallyanne Kuster recommends that you schedule a follow-up appointment in 12 months. You will receive a reminder letter in the mail two months in advance. If you don't receive a letter, please call our office to schedule the follow-up appointment.  If you need a refill on your cardiac medications before your next appointment, please call your pharmacy.   When taking your blood pressure, please make sure you've rested for at least 10 minutes before checking.

## 2017-07-24 DIAGNOSIS — K1120 Sialoadenitis, unspecified: Secondary | ICD-10-CM | POA: Diagnosis not present

## 2017-07-24 DIAGNOSIS — M542 Cervicalgia: Secondary | ICD-10-CM | POA: Diagnosis not present

## 2017-07-24 DIAGNOSIS — I700 Atherosclerosis of aorta: Secondary | ICD-10-CM | POA: Diagnosis not present

## 2017-07-30 NOTE — Progress Notes (Deleted)
[facility]  PATIENT: Mark Stewart DOB: 11-28-1945   REASON FOR VISIT:  follow-up for right thalamic infarct secondary to small vessel disease, memory loss since stroke, risk factors of hypertension hyperlipidemia and insulin-dependent diabetes not well controlled HISTORY FROM: Patient and wife    HISTORY OF PRESENT ILLNESS:UPDATE 04/24/2018CM Mark Stewart, 71 year old male returns for follow-up with history of right ischemic thalmic infarction in May 2017. He is currently on aspirin for secondary stroke prevention with no bruising and bleeding in addition he is on Lipitor without complaints of myalgias. He complains of a lot of fatigue however his diabetes is still not in control and his most recent hemoglobin A1c was 8. He has now been put on a sliding scale. Blood pressure in the office today 102/58. His wife says he doesn't drink enough. He has some recent reduction to his blood pressure medicine from cardiology note. He does little exercise he returns for reevaluation    UPDATE 10/23/2017CM  Mark Stewart, 71 year old male returns for follow-up. He has a history of acute right ischemic thalamic infarction which occurred in May 2017. He remains on aspirin for secondary stroke prevention without significant bruising or bleeding. He denies further stroke or TIA symptoms. Blood pressure is well controlled in the office today at 131/78. He remains on Coreg Catapres and valsartan. He exercises very little. He does not perform his home exercise program. Most recent hemoglobin A1c 8.5 on 06/20/2016. He is also Lipitor without complaints of muscle aches. He returns for reevaluation  HISTORY 04/26/16 CM.Mark Stewart is an 71 y.o. male with a history of diabetes mellitus, hypertension and hyperlipidemia presenting with a complaint of dizziness and unstable gait for about 4 days. He was initially seen in the ED at [facility] on 02/27/16  and subsequently transferred to [facility]  for further management. MRI of his brain showed acute right ischemic thalamic infarction. He had no change in speech. Family has noted slight droop of left side of his face. He has not experienced weakness nor sensory changes involving extremities. He has no previous history of stroke nor TIA. He has not been taking aspirin consistently on a daily basis. NIH stroke score was 2. He was LKW 02/22/2016, time unclear. Patient was not administered IV t-PA secondary to delay in arrival. He was admitted for further evaluation and treatment.CTA head/neck - No significant large vessel finding. High grade stenosis R P4 2D Echo EF 55-60% LDL 71 hemoglobin A1c of 10.7. He returns for follow-up. He has not had further stroke or TIA symptoms. He is currently getting physical therapy for lower extremity weakness. He is not doing home exercise program at this point. He had recent changes to his insulin dose. He is keeping a log of his blood pressures. He has multiple questions. He returns for reevaluation   REVIEW OF SYSTEMS: Full 14 system review of systems performed and notable only for those listed, all others are neg:  Constitutional: Fatigue  Cardiovascular: neg Ear/Nose/Throat: neg  Skin: neg Eyes: neg Respiratory: neg Gastroitestinal: neg  Hematology/Lymphatic: neg Endocrine: neg Musculoskeletal: neg Allergy/Immunology: neg Neurological:neg Psychiatric: neg Sleep : neg   ALLERGIES: Allergies  Allergen Reactions  . Benazepril Cough  . Penicillins Rash    Has patient had a PCN reaction causing immediate rash, facial/tongue/throat swelling, SOB or lightheadedness with hypotension: yes Has patient had a PCN reaction causing severe rash involving mucus membranes or skin necrosis: no Has patient had a PCN reaction that required hospitalization: no Has  patient had a PCN reaction occurring within the last 10 years: no If all of the above answers are "NO", then may proceed with Cephalosporin use.      HOME MEDICATIONS: Outpatient Medications Prior to Visit  Medication Sig Dispense Refill  . Amlodipine-Valsartan-HCTZ 10-320-25 MG TABS TAKE 1/2 TABLET BY MOUTH 2 TIMES DAILY 30 tablet 10  . aspirin EC 325 MG tablet Take 1 tablet (325 mg total) by mouth daily. 30 tablet 0  . atorvastatin (LIPITOR) 80 MG tablet Take 80 mg by mouth daily.    Marland Kitchen buPROPion (WELLBUTRIN SR) 100 MG 12 hr tablet Take 100 mg by mouth daily.    . carvedilol (COREG) 25 MG tablet Take 2 tablets (50 mg total) by mouth 2 (two) times daily. 120 tablet 11  . cloNIDine (CATAPRES - DOSED IN MG/24 HR) 0.2 mg/24hr patch Place 1 patch (0.2 mg total) onto the skin once a week. 4 patch 12  . empagliflozin (JARDIANCE) 25 MG TABS tablet Take 25 mg by mouth daily.     . fluticasone (FLONASE) 50 MCG/ACT nasal spray Place 2 sprays into both nostrils daily as needed for allergies or rhinitis. Reported on 04/26/2016    . insulin degludec (TRESIBA) 100 UNIT/ML SOPN FlexTouch Pen Inject 26 Units into the skin daily.    . meloxicam (MOBIC) 15 MG tablet Take 15 mg by mouth daily.     No facility-administered medications prior to visit.     PAST MEDICAL HISTORY: Past Medical History:  Diagnosis Date  . Allergy   . Cataract    history of surgery bilaterally  . Diabetes mellitus without complication ([place])   . Hypercholesteremia   . Hypertension   . 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 Stroke [facility]) 02/2016    PAST SURGICAL HISTORY: Past Surgical History:  Procedure Laterality Date  . EYE SURGERY    . PROSTATE SURGERY    . SPLENECTOMY      FAMILY HISTORY: Family History  Problem Relation Age of Onset  . Breast cancer Mother   . Diabetes Mellitus II Father   . Breast cancer Sister     SOCIAL HISTORY: Social History   Social History  . Marital status: Married    Spouse name: graye  . Number of children: N/A  . Years of education: N/A   Occupational History  . Not on file.   Social  History Main Topics  . Smoking status: Former Smoker    Packs/day: 1.00    Years: 20.00  . Smokeless tobacco: Never Used     Comment: patient states last cigarette 35 years ago  . Alcohol use No  . Drug use: No  . Sexual activity: Not on file   Other Topics Concern  . Not on file   Social History Narrative  . No narrative on file     PHYSICAL EXAM  There were no vitals filed for this visit. There is no height or weight on file to calculate BMI.  Generalized: Well developed, in no acute distress  Head: normocephalic and atraumatic,. Oropharynx benign  Neck: Supple, no carotid bruits  Cardiac: Regular rate rhythm, no murmur  Musculoskeletal: No deformity   Neurological examination   Mentation: Alert oriented to time, place, history taking. Attention span and concentration appropriate. Recent and remote memory intact.  Follows all commands speech and language fluent.  Cranial nerve II-XII: Pupils were equal round reactive to light extraocular movements were full, visual field  were full on confrontational test. Facial sensation and strength were normal. hearing was intact to finger rubbing bilaterally. Uvula tongue midline. head turning and shoulder shrug were normal and symmetric.Tongue protrusion into cheek strength was normal. Motor: normal bulk and tone, full strength in the BUE, BLE, fine finger movements normal, no pronator drift. No focal weakness Sensory: normal and symmetric to light touch, pinprick, and  Vibration, in the upper and lower extremities Coordination: finger-nose-finger, heel-to-shin bilaterally, no dysmetria. No tremor Reflexes: 1+ upper lower and symmetric, plantar responses were flexor bilaterally. Gait and Station: Rising up from seated position without assistance, normal stance,  moderate stride, good arm swing, smooth turning, able to perform tiptoe, and heel walking without difficulty. Tandem gait is mildly unsteady. No assistive device  DIAGNOSTIC DATA  (LABS, IMAGING, TESTING) - I reviewed patient records, labs, notes, testing and imaging myself where available.  Lab Results  Component Value Date   WBC 6.6 02/27/2016   HGB 14.2 02/27/2016   HCT 42.9 02/27/2016   MCV 91.9 02/27/2016   PLT 248 02/27/2016      Component Value Date/Time   NA 137 02/27/2016 0536   K 3.7 02/27/2016 0536   CL 103 02/27/2016 0536   CO2 28 02/27/2016 0536   GLUCOSE 251 (H) 02/27/2016 0536   BUN 8 02/27/2016 0536   CREATININE 0.71 02/27/2016 0536   CALCIUM 8.5 (L) 02/27/2016 0536   PROT 6.4 (L) 02/27/2016 0536   ALBUMIN 3.1 (L) 02/27/2016 0536   AST 14 (L) 02/27/2016 0536   ALT 16 (L) 02/27/2016 0536   ALKPHOS 72 02/27/2016 0536   BILITOT 0.8 02/27/2016 0536   GFRNONAA >60 02/27/2016 0536   GFRAA >60 02/27/2016 0536   Lab Results  Component Value Date   CHOL 121 02/27/2016   HDL 34 (L) 02/27/2016   LDLCALC 71 02/27/2016   TRIG 82 02/27/2016   CHOLHDL 3.6 02/27/2016   Lab Results  Component Value Date   HGBA1C 10.7 (H) 02/27/2016    ASSESSMENT AND PLAN  71 y.o. year old male  has a past medical history of  Diabetes mellitus without complication ([place]); Hypercholesteremia; Hypertension;  and Stroke ([place]) (02/2016). here For  follow-up. MRI of his brain showed acute right ischemic thalamic infarction.Marland KitchenCTA head/neck - No significant large vessel finding. High grade stenosis R P4 2D Echo EF 55-60% LDL 71 hemoglobin A1c of 8.5. The patient is a current patient of Dr. [name]  who is out of the office today . This note is sent to the work in doctor.      PLAN: Stressed the importance of management of risk factors to prevent further stroke Continue aspirin for secondary stroke prevention Maintain strict control of hypertension with blood pressure goal below 130/90, today's reading 102/58. continue antihypertensive medications and f/u with cardiology Stay well hydrated Control of diabetes with hemoglobin A1c below7 followed by endocrinology  most recent  hemoglobin A1c8.5  continue diabetic medications  Cholesterol with LDL cholesterol less than 70, followed by primary care,  continue Lipitor Exercise by walking, slowly increase , eat healthy diet with whole grains,  fresh fruits and vegetables Follow-up in 6 months I spent 25 min  in total face to face time with the patient more than 50% of which was spent counseling and coordination of care, reviewing test results reviewing medications and discussing and reviewing the diagnosis of stroke and prevention of another stroke by monitoring risk factors. , Dennie Bible, [facility], [facility], [facility] [phone] [address]  [address] [place], [place] [address] [phone]

## 2017-07-31 ENCOUNTER — Ambulatory Visit: Payer: PPO | Admitting: Nurse Practitioner

## 2017-07-31 DIAGNOSIS — E113293 Type 2 diabetes mellitus with mild nonproliferative diabetic retinopathy without macular edema, bilateral: Secondary | ICD-10-CM | POA: Diagnosis not present

## 2017-08-01 ENCOUNTER — Encounter: Payer: Self-pay | Admitting: Nurse Practitioner

## 2017-08-01 DIAGNOSIS — I10 Essential (primary) hypertension: Secondary | ICD-10-CM | POA: Diagnosis not present

## 2017-08-01 DIAGNOSIS — M47819 Spondylosis without myelopathy or radiculopathy, site unspecified: Secondary | ICD-10-CM | POA: Diagnosis not present

## 2017-08-01 DIAGNOSIS — R51 Headache: Secondary | ICD-10-CM | POA: Diagnosis not present

## 2017-08-01 DIAGNOSIS — I7090 Unspecified atherosclerosis: Secondary | ICD-10-CM | POA: Diagnosis not present

## 2017-08-05 DIAGNOSIS — K648 Other hemorrhoids: Secondary | ICD-10-CM | POA: Diagnosis not present

## 2017-08-05 DIAGNOSIS — D126 Benign neoplasm of colon, unspecified: Secondary | ICD-10-CM | POA: Diagnosis not present

## 2017-08-13 DIAGNOSIS — Z1211 Encounter for screening for malignant neoplasm of colon: Secondary | ICD-10-CM | POA: Diagnosis not present

## 2017-08-13 DIAGNOSIS — K648 Other hemorrhoids: Secondary | ICD-10-CM | POA: Diagnosis not present

## 2017-08-13 DIAGNOSIS — I10 Essential (primary) hypertension: Secondary | ICD-10-CM | POA: Diagnosis not present

## 2017-08-13 DIAGNOSIS — Z7982 Long term (current) use of aspirin: Secondary | ICD-10-CM | POA: Diagnosis not present

## 2017-08-13 DIAGNOSIS — Z8673 Personal history of transient ischemic attack (TIA), and cerebral infarction without residual deficits: Secondary | ICD-10-CM | POA: Diagnosis not present

## 2017-08-13 DIAGNOSIS — Z86010 Personal history of colonic polyps: Secondary | ICD-10-CM | POA: Diagnosis not present

## 2017-08-13 DIAGNOSIS — E119 Type 2 diabetes mellitus without complications: Secondary | ICD-10-CM | POA: Diagnosis not present

## 2017-08-13 DIAGNOSIS — Z79899 Other long term (current) drug therapy: Secondary | ICD-10-CM | POA: Diagnosis not present

## 2017-08-13 LAB — HM COLONOSCOPY

## 2017-08-19 DIAGNOSIS — E118 Type 2 diabetes mellitus with unspecified complications: Secondary | ICD-10-CM | POA: Diagnosis not present

## 2017-08-19 DIAGNOSIS — I10 Essential (primary) hypertension: Secondary | ICD-10-CM | POA: Diagnosis not present

## 2017-08-19 DIAGNOSIS — E7849 Other hyperlipidemia: Secondary | ICD-10-CM | POA: Diagnosis not present

## 2017-08-19 DIAGNOSIS — Z794 Long term (current) use of insulin: Secondary | ICD-10-CM | POA: Diagnosis not present

## 2017-08-28 DIAGNOSIS — E118 Type 2 diabetes mellitus with unspecified complications: Secondary | ICD-10-CM | POA: Diagnosis not present

## 2017-08-28 DIAGNOSIS — Z794 Long term (current) use of insulin: Secondary | ICD-10-CM | POA: Diagnosis not present

## 2017-09-02 DIAGNOSIS — E118 Type 2 diabetes mellitus with unspecified complications: Secondary | ICD-10-CM | POA: Diagnosis not present

## 2017-09-02 DIAGNOSIS — Z794 Long term (current) use of insulin: Secondary | ICD-10-CM | POA: Diagnosis not present

## 2017-09-05 NOTE — Progress Notes (Deleted)
[facility]  PATIENT: Mark Stewart DOB: 18-Dec-1945   REASON FOR VISIT:  follow-up for right thalamic infarct secondary to small vessel disease, memory loss since stroke, risk factors of hypertension hyperlipidemia and insulin-dependent diabetes not well controlled HISTORY FROM: Patient and wife    HISTORY OF PRESENT ILLNESS:UPDATE 04/24/2018CM [name], 71 year old male returns for follow-up with history of right ischemic thalmic infarction in May 2017. He is currently on aspirin for secondary stroke prevention with no bruising and bleeding in addition he is on Lipitor without complaints of myalgias. He complains of a lot of fatigue however his diabetes is still not in control and his most recent hemoglobin A1c was 8. He has now been put on a sliding scale. Blood pressure in the office today 102/58. His wife says he doesn't drink enough. He has some recent reduction to his blood pressure medicine from cardiology note. He does little exercise he returns for reevaluation    UPDATE 10/23/2017CM  Mark Stewart, 71 year old male returns for follow-up. He has a history of acute right ischemic thalamic infarction which occurred in May 2017. He remains on aspirin for secondary stroke prevention without significant bruising or bleeding. He denies further stroke or TIA symptoms. Blood pressure is well controlled in the office today at 131/78. He remains on Coreg Catapres and valsartan. He exercises very little. He does not perform his home exercise program. Most recent hemoglobin A1c 8.5 on 06/20/2016. He is also Lipitor without complaints of muscle aches. He returns for reevaluation  HISTORY 04/26/16 Mark.Mark Stewart is an 71 y.o. male with a history of diabetes mellitus, hypertension and hyperlipidemia presenting with a complaint of dizziness and unstable gait for about 4 days. He was initially seen in the ED at [facility] on 02/27/16  and subsequently transferred to [facility]  for further management. MRI of his brain showed acute right ischemic thalamic infarction. He had no change in speech. Family has noted slight droop of left side of his face. He has not experienced weakness nor sensory changes involving extremities. He has no previous history of stroke nor TIA. He has not been taking aspirin consistently on a daily basis. NIH stroke score was 2. He was LKW 02/22/2016, time unclear. Patient was not administered IV t-PA secondary to delay in arrival. He was admitted for further evaluation and treatment.CTA head/neck - No significant large vessel finding. High grade stenosis R P4 2D Echo EF 55-60% LDL 71 hemoglobin A1c of 10.7. He returns for follow-up. He has not had further stroke or TIA symptoms. He is currently getting physical therapy for lower extremity weakness. He is not doing home exercise program at this point. He had recent changes to his insulin dose. He is keeping a log of his blood pressures. He has multiple questions. He returns for reevaluation   REVIEW OF SYSTEMS: Full 14 system review of systems performed and notable only for those listed, all others are neg:  Constitutional: Fatigue  Cardiovascular: neg Ear/Nose/Throat: neg  Skin: neg Eyes: neg Respiratory: neg Gastroitestinal: neg  Hematology/Lymphatic: neg Endocrine: neg Musculoskeletal: neg Allergy/Immunology: neg Neurological:neg Psychiatric: neg Sleep : neg   ALLERGIES: Allergies  Allergen Reactions  . Benazepril Cough  . Penicillins Rash    Has patient had a PCN reaction causing immediate rash, facial/tongue/throat swelling, SOB or lightheadedness with hypotension: yes Has patient had a PCN reaction causing severe rash involving mucus membranes or skin necrosis: no Has patient had a PCN reaction that required hospitalization: no Has  patient had a PCN reaction occurring within the last 10 years: no If all of the above answers are "NO", then may proceed with Cephalosporin use.      HOME MEDICATIONS: Outpatient Medications Prior to Visit  Medication Sig Dispense Refill  . Amlodipine-Valsartan-HCTZ 10-320-25 MG TABS TAKE 1/2 TABLET BY MOUTH 2 TIMES DAILY 30 tablet 10  . aspirin EC 325 MG tablet Take 1 tablet (325 mg total) by mouth daily. 30 tablet 0  . atorvastatin (LIPITOR) 80 MG tablet Take 80 mg by mouth daily.    Marland Kitchen buPROPion (WELLBUTRIN SR) 100 MG 12 hr tablet Take 100 mg by mouth daily.    . carvedilol (COREG) 25 MG tablet Take 2 tablets (50 mg total) by mouth 2 (two) times daily. 120 tablet 11  . cloNIDine (CATAPRES - DOSED IN MG/24 HR) 0.2 mg/24hr patch Place 1 patch (0.2 mg total) onto the skin once a week. 4 patch 12  . empagliflozin (JARDIANCE) 25 MG TABS tablet Take 25 mg by mouth daily.     . fluticasone (FLONASE) 50 MCG/ACT nasal spray Place 2 sprays into both nostrils daily as needed for allergies or rhinitis. Reported on 04/26/2016    . insulin degludec (TRESIBA) 100 UNIT/ML SOPN FlexTouch Pen Inject 26 Units into the skin daily.    . meloxicam (MOBIC) 15 MG tablet Take 15 mg by mouth daily.     No facility-administered medications prior to visit.     PAST MEDICAL HISTORY: Past Medical History:  Diagnosis Date  . Allergy   . Cataract    history of surgery bilaterally  . Diabetes mellitus without complication ([place])   . Hypercholesteremia   . Hypertension   . 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 Stroke [facility]) 02/2016    PAST SURGICAL HISTORY: Past Surgical History:  Procedure Laterality Date  . EYE SURGERY    . PROSTATE SURGERY    . SPLENECTOMY      FAMILY HISTORY: Family History  Problem Relation Age of Onset  . Breast cancer Mother   . Diabetes Mellitus II Father   . Breast cancer Sister     SOCIAL HISTORY: Social History   Socioeconomic History  . Marital status: Married    Spouse name: graye  . Number of children: Not on file  . Years of education: Not on file  . Highest education level:  Not on file  Social Needs  . Financial resource strain: Not on file  . Food insecurity - worry: Not on file  . Food insecurity - inability: Not on file  . Transportation needs - medical: Not on file  . Transportation needs - non-medical: Not on file  Occupational History  . Not on file  Tobacco Use  . Smoking status: Former Smoker    Packs/day: 1.00    Years: 20.00    Pack years: 20.00  . Smokeless tobacco: Never Used  . Tobacco comment: patient states last cigarette 35 years ago  Substance and Sexual Activity  . Alcohol use: No  . Drug use: No  . Sexual activity: Not on file  Other Topics Concern  . Not on file  Social History Narrative  . Not on file     PHYSICAL EXAM  There were no vitals filed for this visit. There is no height or weight on file to calculate BMI.  Generalized: Well developed, in no acute distress  Head: normocephalic and atraumatic,. Oropharynx benign  Neck: Supple, no  carotid bruits  Cardiac: Regular rate rhythm, no murmur  Musculoskeletal: No deformity   Neurological examination   Mentation: Alert oriented to time, place, history taking. Attention span and concentration appropriate. Recent and remote memory intact.  Follows all commands speech and language fluent.  Cranial nerve II-XII: Pupils were equal round reactive to light extraocular movements were full, visual field were full on confrontational test. Facial sensation and strength were normal. hearing was intact to finger rubbing bilaterally. Uvula tongue midline. head turning and shoulder shrug were normal and symmetric.Tongue protrusion into cheek strength was normal. Motor: normal bulk and tone, full strength in the BUE, BLE, fine finger movements normal, no pronator drift. No focal weakness Sensory: normal and symmetric to light touch, pinprick, and  Vibration, in the upper and lower extremities Coordination: finger-nose-finger, heel-to-shin bilaterally, no dysmetria. No tremor Reflexes:  1+ upper lower and symmetric, plantar responses were flexor bilaterally. Gait and Station: Rising up from seated position without assistance, normal stance,  moderate stride, good arm swing, smooth turning, able to perform tiptoe, and heel walking without difficulty. Tandem gait is mildly unsteady. No assistive device  DIAGNOSTIC DATA (LABS, IMAGING, TESTING) - I reviewed patient records, labs, notes, testing and imaging myself where available.  Lab Results  Component Value Date   WBC 6.6 02/27/2016   HGB 14.2 02/27/2016   HCT 42.9 02/27/2016   MCV 91.9 02/27/2016   PLT 248 02/27/2016      Component Value Date/Time   NA 137 02/27/2016 0536   K 3.7 02/27/2016 0536   CL 103 02/27/2016 0536   CO2 28 02/27/2016 0536   GLUCOSE 251 (H) 02/27/2016 0536   BUN 8 02/27/2016 0536   CREATININE 0.71 02/27/2016 0536   CALCIUM 8.5 (L) 02/27/2016 0536   PROT 6.4 (L) 02/27/2016 0536   ALBUMIN 3.1 (L) 02/27/2016 0536   AST 14 (L) 02/27/2016 0536   ALT 16 (L) 02/27/2016 0536   ALKPHOS 72 02/27/2016 0536   BILITOT 0.8 02/27/2016 0536   GFRNONAA >60 02/27/2016 0536   GFRAA >60 02/27/2016 0536   Lab Results  Component Value Date   CHOL 121 02/27/2016   HDL 34 (L) 02/27/2016   LDLCALC 71 02/27/2016   TRIG 82 02/27/2016   CHOLHDL 3.6 02/27/2016   Lab Results  Component Value Date   HGBA1C 10.7 (H) 02/27/2016    ASSESSMENT AND PLAN  71 y.o. year old male  has a past medical history of  Diabetes mellitus without complication ([place]); Hypercholesteremia; Hypertension;  and Stroke ([place]) (02/2016). here For  follow-up. MRI of his brain showed acute right ischemic thalamic infarction.Marland KitchenCTA head/neck - No significant large vessel finding. High grade stenosis R P4 2D Echo EF 55-60% LDL 71 hemoglobin A1c of 8.5. The patient is a current patient of Dr. [name]  who is out of the office today . This note is sent to the work in doctor.      PLAN: Stressed the importance of management of risk factors to  prevent further stroke Continue aspirin for secondary stroke prevention Maintain strict control of hypertension with blood pressure goal below 130/90, today's reading 102/58. continue antihypertensive medications and f/u with cardiology Stay well hydrated Control of diabetes with hemoglobin A1c below7 followed by endocrinology  most recent hemoglobin A1c8.5  continue diabetic medications  Cholesterol with LDL cholesterol less than 70, followed by primary care,  continue Lipitor Exercise by walking, slowly increase , eat healthy diet with whole grains,  fresh fruits and vegetables Follow-up in 6  months I spent 25 min  in total face to face time with the patient more than 50% of which was spent counseling and coordination of care, reviewing test results reviewing medications and discussing and reviewing the diagnosis of stroke and prevention of another stroke by monitoring risk factors. , Rayburn Ma, [facility], APRN  [facility] [address] [place], [place] [address] [phone]

## 2017-09-06 ENCOUNTER — Ambulatory Visit: Payer: PPO | Admitting: Nurse Practitioner

## 2017-09-06 ENCOUNTER — Telehealth: Payer: Self-pay | Admitting: Nurse Practitioner

## 2017-09-06 ENCOUNTER — Telehealth: Payer: Self-pay | Admitting: *Deleted

## 2017-09-06 NOTE — Telephone Encounter (Signed)
Patient was no show for follow up with NP today.  

## 2017-09-06 NOTE — Telephone Encounter (Signed)
Noted, NP aware.

## 2017-09-19 DIAGNOSIS — E118 Type 2 diabetes mellitus with unspecified complications: Secondary | ICD-10-CM | POA: Diagnosis not present

## 2017-09-25 DIAGNOSIS — L578 Other skin changes due to chronic exposure to nonionizing radiation: Secondary | ICD-10-CM | POA: Diagnosis not present

## 2017-09-25 DIAGNOSIS — L821 Other seborrheic keratosis: Secondary | ICD-10-CM | POA: Diagnosis not present

## 2017-09-25 DIAGNOSIS — R233 Spontaneous ecchymoses: Secondary | ICD-10-CM | POA: Diagnosis not present

## 2017-09-25 DIAGNOSIS — L570 Actinic keratosis: Secondary | ICD-10-CM | POA: Diagnosis not present

## 2017-09-25 DIAGNOSIS — C44629 Squamous cell carcinoma of skin of left upper limb, including shoulder: Secondary | ICD-10-CM | POA: Diagnosis not present

## 2017-09-25 DIAGNOSIS — C44622 Squamous cell carcinoma of skin of right upper limb, including shoulder: Secondary | ICD-10-CM | POA: Diagnosis not present

## 2017-09-27 DIAGNOSIS — C44629 Squamous cell carcinoma of skin of left upper limb, including shoulder: Secondary | ICD-10-CM | POA: Diagnosis not present

## 2017-10-08 DIAGNOSIS — E118 Type 2 diabetes mellitus with unspecified complications: Secondary | ICD-10-CM | POA: Diagnosis not present

## 2017-10-11 DIAGNOSIS — C44622 Squamous cell carcinoma of skin of right upper limb, including shoulder: Secondary | ICD-10-CM | POA: Diagnosis not present

## 2017-10-24 NOTE — Progress Notes (Signed)
[facility]  PATIENT: Mark Stewart DOB: [birth year] patient   REASON FOR VISIT:  follow-up for right thalamic infarct secondary to small vessel disease, memory loss since stroke, risk factors of hypertension hyperlipidemia and  diabetes not well controlled HISTORY FROM: Patient and wife    HISTORY OF PRESENT ILLNESS: UPDATE 10/25/17 CM Mark Stewart, 72 year old male returns for follow-up with a history of right thalmic infarct in May 2017.  He is currently on aspirin for secondary stroke prevention without further stroke or TIA symptoms.  He does have some bruising no bleeding.  He remains on Lipitor for secondary stroke prevention hyperlipidemia he denies myalgias.  He continues to complain with fatigue, unable to get his strength back since his stroke.  He does not exercise.  CBGs remain elevated most of the time.  He has significant daytime drowsiness according to the wife.  He usually falls asleep in the chair at night.  He complains of more memory loss.  He also has a history of depression and is on Wellbutrin.  Appetite is good however they eat out a lot.  According to the wife he could sleep all the time.  He also has poor p.o. intake with water basically drinks sodas during the day.  He returns for reevaluation.  He just saw his cardiologist Dr.Croitoru in October.  No changes were made   UPDATE 04/24/2018CM Mark Stewart, 72 year old male returns for follow-up with history of right ischemic thalmic infarction in May 2017. He is currently on aspirin for secondary stroke prevention with no bruising and bleeding in addition he is on Lipitor without complaints of myalgias. He complains of a lot of fatigue however his diabetes is still not in control and his most recent hemoglobin A1c was 8. He has now been put on a sliding scale. Blood pressure in the office today 102/58. His wife says he doesn't drink enough. He has some recent reduction to his blood pressure medicine from  cardiology note. He does little exercise he returns for reevaluation    UPDATE 10/23/2017CM  Mark Stewart, 73 year old male returns for follow-up. He has a history of acute right ischemic thalamic infarction which occurred in May 2017. He remains on aspirin for secondary stroke prevention without significant bruising or bleeding. He denies further stroke or TIA symptoms. Blood pressure is well controlled in the office today at 131/78. He remains on Coreg Catapres and valsartan. He exercises very little. He does not perform his home exercise program. Most recent hemoglobin A1c 8.5 on 06/20/2016. He is also Lipitor without complaints of muscle aches. He returns for reevaluation  HISTORY 04/26/16 CM.Mark Stewart is an 72 y.o. male with a history of diabetes mellitus, hypertension and hyperlipidemia presenting with a complaint of dizziness and unstable gait for about 4 days. He was initially seen in the ED at [facility] on 02/27/16  and subsequently transferred to [facility] for further management. MRI of his brain showed acute right ischemic thalamic infarction. He had no change in speech. Family has noted slight droop of left side of his face. He has not experienced weakness nor sensory changes involving extremities. He has no previous history of stroke nor TIA. He has not been taking aspirin consistently on a daily basis. NIH stroke score was 2. He was LKW 02/22/2016, time unclear. Patient was not administered IV t-PA secondary to delay in arrival. He was admitted for further evaluation and treatment.CTA head/neck - No significant large vessel finding. High grade stenosis R P4  2D Echo EF 55-60% LDL 71 hemoglobin A1c of 10.7. He returns for follow-up. He has not had further stroke or TIA symptoms. He is currently getting physical therapy for lower extremity weakness. He is not doing home exercise program at this point. He had recent changes to his insulin dose. He is keeping a log of his blood pressures. He  has multiple questions. He returns for reevaluation   REVIEW OF SYSTEMS: Full 14 system review of systems performed and notable only for those listed, all others are neg:  Constitutional: Fatigue  Cardiovascular: neg Ear/Nose/Throat: Hearing loss Skin: neg Eyes: Blurred vision Respiratory: neg Gastroitestinal: neg  Hematology/Lymphatic: neg Endocrine: neg Musculoskeletal: neg Allergy/Immunology: neg Neurological: memory loss Psychiatric: Depression Sleep : Daytime sleepiness   ALLERGIES: Allergies  Allergen Reactions  . Benazepril Cough  . Penicillins Rash    Has patient had a PCN reaction causing immediate rash, facial/tongue/throat swelling, SOB or lightheadedness with hypotension: yes Has patient had a PCN reaction causing severe rash involving mucus membranes or skin necrosis: no Has patient had a PCN reaction that required hospitalization: no Has patient had a PCN reaction occurring within the last 10 years: no If all of the above answers are "NO", then may proceed with Cephalosporin use.     HOME MEDICATIONS: Outpatient Medications Prior to Visit  Medication Sig Dispense Refill  . Amlodipine-Valsartan-HCTZ 10-320-25 MG TABS TAKE 1/2 TABLET BY MOUTH 2 TIMES DAILY 30 tablet 10  . aspirin EC 325 MG tablet Take 1 tablet (325 mg total) by mouth daily. 30 tablet 0  . atorvastatin (LIPITOR) 80 MG tablet Take 80 mg by mouth daily.    Marland Kitchen buPROPion (WELLBUTRIN SR) 100 MG 12 hr tablet Take 100 mg by mouth daily.    . carvedilol (COREG) 25 MG tablet Take 2 tablets (50 mg total) by mouth 2 (two) times daily. 120 tablet 11  . cloNIDine (CATAPRES - DOSED IN MG/24 HR) 0.2 mg/24hr patch Place 1 patch (0.2 mg total) onto the skin once a week. 4 patch 12  . empagliflozin (JARDIANCE) 25 MG TABS tablet Take 25 mg by mouth daily.     . fluticasone (FLONASE) 50 MCG/ACT nasal spray Place 2 sprays into both nostrils daily as needed for allergies or rhinitis. Reported on 04/26/2016    . insulin  degludec (TRESIBA) 100 UNIT/ML SOPN FlexTouch Pen Inject 26 Units into the skin daily.    . meloxicam (MOBIC) 15 MG tablet Take 15 mg by mouth daily.     No facility-administered medications prior to visit.     PAST MEDICAL HISTORY: Past Medical History:  Diagnosis Date  . Allergy   . Cancer ([place])    skin, hand  . Cataract    history of surgery bilaterally  . Diabetes mellitus without complication ([place])   . Hypercholesteremia   . Hypertension   . 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 Stroke [facility]) 02/2016    PAST SURGICAL HISTORY: Past Surgical History:  Procedure Laterality Date  . EYE SURGERY    . PROSTATE SURGERY    . SPLENECTOMY      FAMILY HISTORY: Family History  Problem Relation Age of Onset  . Breast cancer Mother   . Diabetes Mellitus II Father   . Breast cancer Sister     SOCIAL HISTORY: Social History   Socioeconomic History  . Marital status: Married    Spouse name: graye  . Number of children: Not on file  .  Years of education: Not on file  . Highest education level: Not on file  Social Needs  . Financial resource strain: Not on file  . Food insecurity - worry: Not on file  . Food insecurity - inability: Not on file  . Transportation needs - medical: Not on file  . Transportation needs - non-medical: Not on file  Occupational History  . Not on file  Tobacco Use  . Smoking status: Former Smoker    Packs/day: 1.00    Years: 20.00    Pack years: 20.00  . Smokeless tobacco: Never Used  . Tobacco comment: patient states last cigarette 35 years ago  Substance and Sexual Activity  . Alcohol use: No  . Drug use: No  . Sexual activity: Not on file  Other Topics Concern  . Not on file  Social History Narrative  . Not on file     PHYSICAL EXAM  Vitals:   10/25/17 0805  BP: 116/69  Pulse: 63  Weight: 195 lb (88.5 kg)   Body mass index is 26.45 kg/m.  Generalized: Well developed, in no acute distress    Head: normocephalic and atraumatic,. Oropharynx benign  Neck: Supple, no carotid bruits  Cardiac: Regular rate rhythm, no murmur  Musculoskeletal: No deformity   Neurological examination   Mentation: Alert oriented to time, place, history taking. MMSE 27/30 missing calculation and 1 of 3 recall.  Clock drawing for 4 AFT 9.   Follows all commands speech and language fluent.  Cranial nerve II-XII: Pupils were equal round reactive to light extraocular movements were full, visual field were full on confrontational test. Facial sensation and strength were normal. hearing was intact to finger rubbing bilaterally. Uvula tongue midline. head turning and shoulder shrug were normal and symmetric.Tongue protrusion into cheek strength was normal. Motor: normal bulk and tone, full strength in the BUE, BLE, fine finger movements normal, no pronator drift. No focal weakness Sensory: normal and symmetric to light touch, pinprick, and  Vibration, in the upper and lower extremities Coordination: finger-nose-finger, heel-to-shin bilaterally, no dysmetria. No tremor Reflexes: 1+ upper lower and symmetric, plantar responses were flexor bilaterally. Gait and Station: Rising up from seated position without assistance, normal stance,  moderate stride, good arm swing, smooth turning, able to perform tiptoe, and heel walking without difficulty. Tandem gait is  unsteady. No assistive device  DIAGNOSTIC DATA (LABS, IMAGING, TESTING) - I reviewed patient records, labs, notes, testing and imaging myself where available.  Lab Results  Component Value Date   WBC 6.6 02/27/2016   HGB 14.2 02/27/2016   HCT 42.9 02/27/2016   MCV 91.9 02/27/2016   PLT 248 02/27/2016      Component Value Date/Time   NA 137 02/27/2016 0536   K 3.7 02/27/2016 0536   CL 103 02/27/2016 0536   CO2 28 02/27/2016 0536   GLUCOSE 251 (H) 02/27/2016 0536   BUN 8 02/27/2016 0536   CREATININE 0.71 02/27/2016 0536   CALCIUM 8.5 (L) 02/27/2016  0536   PROT 6.4 (L) 02/27/2016 0536   ALBUMIN 3.1 (L) 02/27/2016 0536   AST 14 (L) 02/27/2016 0536   ALT 16 (L) 02/27/2016 0536   ALKPHOS 72 02/27/2016 0536   BILITOT 0.8 02/27/2016 0536   GFRNONAA >60 02/27/2016 0536   GFRAA >60 02/27/2016 0536   Lab Results  Component Value Date   CHOL 121 02/27/2016   HDL 34 (L) 02/27/2016   LDLCALC 71 02/27/2016   TRIG 82 02/27/2016   CHOLHDL 3.6 02/27/2016  Lab Results  Component Value Date   HGBA1C 10.7 (H) 02/27/2016    ASSESSMENT AND PLAN  72 y.o. year old male  has a past medical history of  Diabetes mellitus without complication ([place]); Hypercholesteremia; Hypertension;  and Stroke ([place]) (02/2016). here For  follow-up. MRI of his brain showed acute right ischemic thalamic infarction.Marland KitchenCTA head/neck - No significant large vessel finding. High grade stenosis R P4 2D Echo EF 55-60% LDL 71 hemoglobin A1c of 8.5.  Patient continues to complain of significant fatigue, daytime drowsiness and memory loss.  The patient has never had a sleep study.  The patient is a current patient of Dr. [name]  who is out of the office today . This note is sent to the work in doctor.      PLAN: Stressed the importance of management of risk factors to prevent further stroke Continue aspirin for secondary stroke prevention Maintain strict control of hypertension with blood pressure goal below 130/90, today's reading 102/58. continue antihypertensive medications and f/u with cardiology Stay well hydrated this is important to keep from being dehydrated Control of diabetes with hemoglobin A1c below7 followed by endocrinology    continue diabetic medications  Cholesterol with LDL cholesterol less than 70, followed by primary care,  continue Lipitor Exercise by walking, at least 30 minutes daily  Diabetic diet which is low-fat low-carb   fresh  Vegetables Continue follow-up with primary care for depression or may see psychiatrist Will obtain sleep study for daytime  drowsiness Follow-up in 6 months I explained in particular the risks and ramifications of untreated moderate to severe OSA, especially with respect to cardiovascular disease  including congestive heart failure, difficult to treat hypertension, cardiac arrhythmias, or stroke. Even type 2 diabetes has, in part, been linked to untreated OSA. Symptoms of untreated OSA include daytime sleepiness, memory problems, mood irritability and mood disorder such as depression and anxiety, lackof energy, as well as recurrent headaches, especially morning headaches. I spent 45 min  in total face to face time with the patient more than 50% of which was spent counseling and coordination of care, reviewing test results reviewing medications and discussing and reviewing the diagnosis of stroke and prevention of another stroke by monitoring risk factors, also discussed optimal treatment of depression, and importance of sleep study for his reported symptoms of fatigue memory loss.  Dennie Bible, [facility], [facility], APRN  [facility] [address] [place], [place] [address] [phone]

## 2017-10-25 ENCOUNTER — Ambulatory Visit: Payer: PPO | Admitting: Nurse Practitioner

## 2017-10-25 ENCOUNTER — Encounter (INDEPENDENT_AMBULATORY_CARE_PROVIDER_SITE_OTHER): Payer: Self-pay

## 2017-10-25 ENCOUNTER — Encounter: Payer: Self-pay | Admitting: Nurse Practitioner

## 2017-10-25 VITALS — BP 116/69 | HR 63 | Wt 195.0 lb

## 2017-10-25 DIAGNOSIS — E782 Mixed hyperlipidemia: Secondary | ICD-10-CM | POA: Diagnosis not present

## 2017-10-25 DIAGNOSIS — I10 Essential (primary) hypertension: Secondary | ICD-10-CM

## 2017-10-25 DIAGNOSIS — R400 Somnolence: Secondary | ICD-10-CM | POA: Diagnosis not present

## 2017-10-25 DIAGNOSIS — I6350 Cerebral infarction due to unspecified occlusion or stenosis of unspecified cerebral artery: Secondary | ICD-10-CM

## 2017-10-25 DIAGNOSIS — E1165 Type 2 diabetes mellitus with hyperglycemia: Secondary | ICD-10-CM

## 2017-10-25 NOTE — Patient Instructions (Signed)
Stressed the importance of management of risk factors to prevent further stroke Continue aspirin for secondary stroke prevention Maintain strict control of hypertension with blood pressure goal below 130/90, today's reading 102/58. continue antihypertensive medications and f/u with cardiology Stay well hydrated this is important to keep from being dehydrated Control of diabetes with hemoglobin A1c below7 followed by endocrinology    continue diabetic medications  Cholesterol with LDL cholesterol less than 70, followed by primary care,  continue Lipitor Exercise by walking, at least 30 minutes daily  Diabetic diet which is low-fat low-carb   fresh  Vegetables Continue follow-up with primary care for depression Will obtain sleep study for daytime drowsiness Follow-up in 6 months I explained in particular the risks and ramifications of untreated moderate to severe OSA, especially with respect to cardiovascular disease  including congestive heart failure, difficult to treat hypertension, cardiac arrhythmias, or stroke. Even type 2 diabetes has, in part, been linked to untreated OSA. Symptoms of untreated OSA include daytime sleepiness, memory problems, mood irritability and mood disorder such as depression and anxiety, lack of energy, as well as recurrent headaches, especially morning headaches.

## 2017-10-27 NOTE — Progress Notes (Signed)
I agree with the assessment and plan as directed by NP .The patient is not known to me .   Fountain Derusha, MD

## 2017-11-14 DIAGNOSIS — H903 Sensorineural hearing loss, bilateral: Secondary | ICD-10-CM | POA: Diagnosis not present

## 2017-11-20 DIAGNOSIS — I10 Essential (primary) hypertension: Secondary | ICD-10-CM | POA: Diagnosis not present

## 2017-11-20 DIAGNOSIS — E118 Type 2 diabetes mellitus with unspecified complications: Secondary | ICD-10-CM | POA: Diagnosis not present

## 2017-11-20 DIAGNOSIS — E7849 Other hyperlipidemia: Secondary | ICD-10-CM | POA: Diagnosis not present

## 2017-11-20 DIAGNOSIS — Z794 Long term (current) use of insulin: Secondary | ICD-10-CM | POA: Diagnosis not present

## 2017-11-25 DIAGNOSIS — H903 Sensorineural hearing loss, bilateral: Secondary | ICD-10-CM | POA: Diagnosis not present

## 2017-12-05 ENCOUNTER — Encounter: Payer: Self-pay | Admitting: Neurology

## 2017-12-05 ENCOUNTER — Ambulatory Visit (INDEPENDENT_AMBULATORY_CARE_PROVIDER_SITE_OTHER): Payer: PPO | Admitting: Neurology

## 2017-12-05 VITALS — BP 176/72 | HR 80 | Ht 72.0 in | Wt 192.0 lb

## 2017-12-05 DIAGNOSIS — R400 Somnolence: Secondary | ICD-10-CM | POA: Diagnosis not present

## 2017-12-05 DIAGNOSIS — E663 Overweight: Secondary | ICD-10-CM

## 2017-12-05 DIAGNOSIS — I6350 Cerebral infarction due to unspecified occlusion or stenosis of unspecified cerebral artery: Secondary | ICD-10-CM

## 2017-12-05 DIAGNOSIS — R0683 Snoring: Secondary | ICD-10-CM

## 2017-12-05 DIAGNOSIS — R351 Nocturia: Secondary | ICD-10-CM

## 2017-12-05 DIAGNOSIS — M25472 Effusion, left ankle: Secondary | ICD-10-CM

## 2017-12-05 NOTE — Patient Instructions (Signed)

## 2017-12-05 NOTE — Progress Notes (Signed)
Subjective:    Patient ID: Mark Stewart is a 72 y.o. male.  HPI     Star Age, MD, PhD [facility] [address], [address] [place], [place] [address]  Dear Mark Stewart and Mark Stewart,   I saw your patient, Mark Stewart, upon your kind request in my clinic today for initial consultation of his sleep disorder, in particular, concern for underlying obstructive sleep apnea. The patient is accompanied by his wife today. As you know, Ms. Mark Stewart is a 71 year old right-handed gentleman with an underlying medical history of hypertension, hyperlipidemia, status post splenectomy as a teenager, stroke in 2017, diabetes, cataracts, allergies, and overweight state, who reports snoring and excessive daytime somnolence. I reviewed your office note from 10/25/2017. His Epworth sleepiness score is 23 out of 24 today, fatigue score is 41 out of 63. He quit smoking, he does not utilize alcohol currently, drinks caffeine in the form of coffee, typically 1 cup per day and 3 cups of tea per day on average. He does not have that scheduled for his bedtime and rise time. Per wife, he tends to sit in the recliner in the dependent watch TV but he dozes off frequently. He may not come to bed until 1 or 2 AM and sometimes as late as 3 AM, her patient his rise time is around 7 but per wife, he may be in bed until 10 unless she makes him get out of bed. He does not have a formal family history of sleep apnea but suspects that his father could've had it. He had a tonsillectomy as a child. He quit smoking over 30 years ago. He has 20 more is and 4 grandchildren, is retired as an Optometrist. His wife also recently retired. His weight has been stable. He has nocturia about once or twice per night, denies morning headaches. He denies telltale symptoms of restless leg syndrome. Sometimes he mumbles in his sleep and says a few words per wife's report.  His Past Medical History Is Significant For: Past  Medical History:  Diagnosis Date  . Allergy   . Cancer ([place])    skin, hand  . Cataract    history of surgery bilaterally  . Diabetes mellitus without complication ([place])   . Hypercholesteremia   . Hypertension   . 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 Stroke [facility]) 02/2016    His Past Surgical History Is Significant For: Past Surgical History:  Procedure Laterality Date  . EYE SURGERY    . PROSTATE SURGERY    . SPLENECTOMY      His Family History Is Significant For: Family History  Problem Relation Age of Onset  . Breast cancer Mother   . Diabetes Mellitus II Father   . Breast cancer Sister     His Social History Is Significant For: Social History   Socioeconomic History  . Marital status: Married    Spouse name: graye  . Number of children: None  . Years of education: None  . Highest education level: None  Social Needs  . Financial resource strain: None  . Food insecurity - worry: None  . Food insecurity - inability: None  . Transportation needs - medical: None  . Transportation needs - non-medical: None  Occupational History  . None  Tobacco Use  . Smoking status: Former Smoker    Packs/day: 1.00    Years: 20.00    Pack years: 20.00  . Smokeless  tobacco: Never Used  . Tobacco comment: patient states last cigarette 35 years ago  Substance and Sexual Activity  . Alcohol use: No  . Drug use: No  . Sexual activity: None  Other Topics Concern  . None  Social History Narrative  . None    His Allergies Are:  Allergies  Allergen Reactions  . Benazepril Cough  . Penicillins Rash    Has patient had a PCN reaction causing immediate rash, facial/tongue/throat swelling, SOB or lightheadedness with hypotension: yes Has patient had a PCN reaction causing severe rash involving mucus membranes or skin necrosis: no Has patient had a PCN reaction that required hospitalization: no Has patient had a PCN reaction occurring within the  last 10 years: no If all of the above answers are "NO", then may proceed with Cephalosporin use.   :   His Current Medications Are:  Outpatient Encounter Medications as of 12/05/2017  Medication Sig  . Amlodipine-Valsartan-HCTZ 10-320-25 MG TABS TAKE 1/2 TABLET BY MOUTH 2 TIMES DAILY  . aspirin EC 325 MG tablet Take 1 tablet (325 mg total) by mouth daily.  Marland Kitchen atorvastatin (LIPITOR) 80 MG tablet Take 80 mg by mouth daily.  Marland Kitchen buPROPion (WELLBUTRIN SR) 100 MG 12 hr tablet Take 100 mg by mouth daily.  . carvedilol (COREG) 25 MG tablet Take 2 tablets (50 mg total) by mouth 2 (two) times daily.  . cloNIDine (CATAPRES - DOSED IN MG/24 HR) 0.2 mg/24hr patch Place 1 patch (0.2 mg total) onto the skin once a week.  . empagliflozin (JARDIANCE) 25 MG TABS tablet Take 25 mg by mouth daily.   . fluticasone (FLONASE) 50 MCG/ACT nasal spray Place 2 sprays into both nostrils daily as needed for allergies or rhinitis. Reported on 04/26/2016  . insulin degludec (TRESIBA) 100 UNIT/ML SOPN FlexTouch Pen Inject 26 Units into the skin daily.  . meloxicam (MOBIC) 15 MG tablet Take 15 mg by mouth daily.   No facility-administered encounter medications on file as of 12/05/2017.   :  Review of Systems:  Out of a complete 14 point review of systems, all are reviewed and negative with the exception of these symptoms as listed below: Review of Systems  Neurological:       Pt presents today to discuss his sleep. Pt has never had a sleep study but does endorse occasional snoring.  Epworth Sleepiness Scale 0= would never doze 1= slight chance of dozing 2= moderate chance of dozing 3= high chance of dozing  Sitting and reading: 3 Watching TV: 3 Sitting inactive in a public place (ex. Theater or meeting): 3 As a passenger in a car for an hour without a break: 3 Lying down to rest in the afternoon: 3 Sitting and talking to someone: 3 Sitting quietly after lunch (no alcohol): 3 In a car, while stopped in traffic:  2 Total: 23     Objective:  Neurological Exam  Physical Exam Physical Examination:   Vitals:   12/05/17 1104  BP: (!) 176/72  Pulse: 80    General Examination: The patient is a very pleasant 72 y.o. male in no acute distress. He appears well-developed and well-nourished and well groomed.   HEENT: Normocephalic, atraumatic, pupils are equal, round and reactive to light and accommodation. He wears corrective eyeglasses. He has hearing aids. Face is symmetric, no dysarthria, no facial weakness. No nuchal rigidity. Airway examination reveals adequate dental hygiene, moderate airway crowding secondary to floppy soft palate and redundant soft palate, wider uvula, tongue is normal  and protrudes centrally, palate elevates symmetrically. Mallampati is class II, tonsils are absent. Neck circumference is 15-1/2 inches. He has a mild overbite. Nasal inspection shows mild deviation of the septum to the right but no nasal crowding.  Chest: Clear to auscultation without wheezing, rhonchi or crackles noted.  Heart: S1+S2+0, regular and normal without murmurs, rubs or gallops noted.   Abdomen: Soft, non-tender and non-distended with normal bowel sounds appreciated on auscultation.  Extremities: There is trace pitting edema in the left ankle.   Skin: Warm and dry without trophic changes noted.  Musculoskeletal: exam reveals no obvious joint deformities, tenderness or joint swelling or erythema.   Neurologically:  Mental status: The patient is awake, alert and oriented in all 4 spheres. His immediate and remote memory, attention, language skills and fund of knowledge are appropriate. There is no evidence of aphasia, agnosia, apraxia or anomia. Speech is clear with normal prosody and enunciation. Thought process is linear. Mood is normal and affect is normal.  Cranial nerves II - XII are as described above under HEENT exam. In addition: shoulder shrug is normal with equal shoulder height noted. Motor  exam: Normal bulk, strength and tone is noted. There is no drift, or tremor. Fine motor skills and coordination: grossly intact.  Cerebellar testing: No dysmetria or intention tremor. There is no truncal or gait ataxia.  Sensory exam: intact to light touch in the upper and lower extremities.  Gait, station and balance: He stands easily. No veering to one side is noted. No leaning to one side is noted. Posture is age-appropriate and stance is narrow based. Gait shows normal stride length and normal pace. No problems turning are noted.   Assessment and Plan:  In summary, LEVITICUS HARTON is a 72 y.o. male with an underlying medical history of hypertension, hyperlipidemia, status post splenectomy as a teenager, stroke in 2017, diabetes, cataracts, allergies, and overweight state, who  presents for sleep consultation with a history and exam somewhat concerning for underlying obstructive sleep apnea. His sleepiness may also be secondary to medication effect from his clonidine and/or his beta blocker. Nevertheless, given his history of stroke we should proceed with sleep study testing to rule out obstructive sleep apnea as an underlying organic cause of his daytime somnolence. In addition, it is going to be imperative that he improve his sleep hygiene. We talked at length about this as well. I had a long chat with the patient and his wife about my findings and the diagnosis of OSA, its prognosis and treatment options. We talked about medical treatments, surgical interventions and non-pharmacological approaches. I explained in particular the risks and ramifications of untreated moderate to severe OSA, especially with respect to developing cardiovascular disease down the Road, including congestive heart failure, difficult to treat hypertension, cardiac arrhythmias, or stroke. Even type 2 diabetes has, in part, been linked to untreated OSA. Symptoms of untreated OSA include daytime sleepiness, memory problems, mood  irritability and mood disorder such as depression and anxiety, lack of energy, as well as recurrent headaches, especially morning headaches. We talked about trying to maintain a healthy lifestyle in general, as well as the importance of weight control. I encouraged the patient to eat healthy, exercise daily and keep well hydrated, to keep a scheduled bedtime and wake time routine, to not skip any meals and eat healthy snacks in between meals. I advised the patient not to drive when feeling sleepy. I recommended the following at this time: sleep study with potential positive airway  pressure titration. (We will score hypopneas at 4%).   I explained the sleep test procedure to the patient and also outlined possible surgical and non-surgical treatment options of OSA, including the use of CPAP. He indicated that he would be willing to try CPAP if the need arises. I explained the importance of being compliant with PAP treatment, not only for insurance purposes but primarily to improve His symptoms, and for the patient's long term health benefit, including to reduce His cardiovascular risks. I answered all their questions today and the patient and his were in agreement. I will likely see him back after the sleep study is completed and encouraged them to call with any interim questions, concerns, problems or updates.   Thank you very much for allowing me to participate in the care of this nice patient. If I can be of any further assistance to you please do not hesitate to talk to me.  Sincerely,   Star Age, MD, PhD

## 2017-12-17 ENCOUNTER — Other Ambulatory Visit: Payer: Self-pay | Admitting: Cardiovascular Disease

## 2017-12-18 DIAGNOSIS — E7849 Other hyperlipidemia: Secondary | ICD-10-CM | POA: Diagnosis not present

## 2017-12-18 DIAGNOSIS — I10 Essential (primary) hypertension: Secondary | ICD-10-CM | POA: Diagnosis not present

## 2017-12-18 DIAGNOSIS — F331 Major depressive disorder, recurrent, moderate: Secondary | ICD-10-CM | POA: Diagnosis not present

## 2017-12-18 DIAGNOSIS — Z23 Encounter for immunization: Secondary | ICD-10-CM | POA: Diagnosis not present

## 2017-12-28 DIAGNOSIS — E11649 Type 2 diabetes mellitus with hypoglycemia without coma: Secondary | ICD-10-CM | POA: Diagnosis not present

## 2017-12-28 DIAGNOSIS — Z794 Long term (current) use of insulin: Secondary | ICD-10-CM | POA: Diagnosis not present

## 2017-12-28 DIAGNOSIS — E160 Drug-induced hypoglycemia without coma: Secondary | ICD-10-CM | POA: Diagnosis not present

## 2017-12-28 DIAGNOSIS — Z9641 Presence of insulin pump (external) (internal): Secondary | ICD-10-CM | POA: Diagnosis not present

## 2017-12-28 DIAGNOSIS — E162 Hypoglycemia, unspecified: Secondary | ICD-10-CM | POA: Diagnosis not present

## 2018-01-06 DIAGNOSIS — E876 Hypokalemia: Secondary | ICD-10-CM | POA: Diagnosis not present

## 2018-01-15 ENCOUNTER — Ambulatory Visit (INDEPENDENT_AMBULATORY_CARE_PROVIDER_SITE_OTHER): Payer: PPO | Admitting: Neurology

## 2018-01-15 DIAGNOSIS — E663 Overweight: Secondary | ICD-10-CM

## 2018-01-15 DIAGNOSIS — G4720 Circadian rhythm sleep disorder, unspecified type: Secondary | ICD-10-CM

## 2018-01-15 DIAGNOSIS — I6350 Cerebral infarction due to unspecified occlusion or stenosis of unspecified cerebral artery: Secondary | ICD-10-CM

## 2018-01-15 DIAGNOSIS — G4733 Obstructive sleep apnea (adult) (pediatric): Secondary | ICD-10-CM

## 2018-01-15 DIAGNOSIS — R0683 Snoring: Secondary | ICD-10-CM

## 2018-01-15 DIAGNOSIS — R400 Somnolence: Secondary | ICD-10-CM

## 2018-01-15 DIAGNOSIS — R351 Nocturia: Secondary | ICD-10-CM

## 2018-01-27 ENCOUNTER — Telehealth: Payer: Self-pay

## 2018-01-27 NOTE — Telephone Encounter (Signed)
I called pt. I advised pt that Dr. Rexene Alberts reviewed their sleep study results and found that pt has mild osa which is likely underestimated given the absence of supine sleep and recommends that pt be treated with a cpap. Dr. Rexene Alberts recommends that pt return for a repeat sleep study in order to properly titrate the cpap and ensure a good mask fit. Pt is agreeable to returning for a titration study. I advised pt that our sleep lab will file with pt's insurance and call pt to schedule the sleep study when we hear back from the pt's insurance regarding coverage of this sleep study. Pt verbalized understanding of results. Pt had no questions at this time but was encouraged to call back if questions arise.

## 2018-01-27 NOTE — Progress Notes (Signed)
cpap

## 2018-01-27 NOTE — Telephone Encounter (Signed)
-----   Message from Star Age, MD sent at 01/27/2018  3:15 PM EDT ----- Patient referred by CM and Dr. [name], seen by me on 12/05/17, diagnostic PSG on 01/15/18.   Please call and notify the patient that the recent sleep study showed overall mild obstructive sleep apnea, with a total AHI of 11.1/hour, REM AHI of 9.2/hour, and O2 nadir of 86%. The absence of supine sleep likely underestimates his AHI and O2 nadir. Given the patient's medical history and sleep related complaints, I recommend treatment in the form of CPAP. This will require a repeat sleep study for proper titration and mask fitting and correct monitoring of the oxygen saturations. Please explain to patient. I have placed an order in the chart. Thanks.  Star Age, MD, PhD [facility])

## 2018-01-27 NOTE — Addendum Note (Signed)
Addended by: Star Age on: 01/27/2018 03:15 PM   Modules accepted: Orders

## 2018-01-27 NOTE — Procedures (Signed)
PATIENT'S NAME:  Mark Stewart, Mark Stewart DOB:      10/16/1945      MR#:    [id number]     DATE OF RECORDING: 01/15/2018 REFERRING M.D.: [name], NP/Jindong [name], MD Study Performed:   Baseline Polysomnogram HISTORY: 72 year old man with a history of hypertension, hyperlipidemia, status post splenectomy as a teenager, stroke in 2017, diabetes, cataracts, allergies, and overweight state, who reports snoring and excessive daytime somnolence. The patient endorsed the Epworth Sleepiness Scale at 23/24 points. The patient's weight 192 pounds with a height of 72 (inches), resulting in a BMI of 26. kg/m2. The patient's neck circumference measured 15.5 inches.  CURRENT MEDICATIONS: Amlodipine-Valsartan, Aspirin, Lipitor, Wellbutrin, Coreg, Catapres, Jardiance, Flonase, Tresiba, Mobic.   PROCEDURE:  This is a multichannel digital polysomnogram utilizing the Somnostar 11.2 system.  Electrodes and sensors were applied and monitored per AASM Specifications.   EEG, EOG, Chin and Limb EMG, were sampled at 200 Hz.  ECG, Snore and Nasal Pressure, Thermal Airflow, Respiratory Effort, CPAP Flow and Pressure, Oximetry was sampled at 50 Hz. Digital video and audio were recorded.      BASELINE STUDY  Lights Out was at 23:04 and Lights On at 05:08.  Total recording time (TRT) was 364.5 minutes, with a total sleep time (TST) of  270 minutes.   The patient's sleep latency was 92 minutes, which is delayed. REM latency was 87.5 minutes. The sleep efficiency was 74.1%, which is reduced.     SLEEP ARCHITECTURE: WASO (Wake after sleep onset) was 80.5 minutes with mild sleep fragmentation noted and 2 longer periods of wakefulness.  There were 20.5 minutes in Stage N1, 186.5 minutes Stage N2, 17.5 minutes Stage N3 and 45.5 minutes in Stage REM.  The percentage of Stage N1 was 7.6%, Stage N2 was 69.1%, which is increased, Stage N3 was 6.5% and Stage R (REM sleep) was 16.9%, which is reduced.  The arousals were noted as: 31 were  spontaneous, 1 were associated with PLMs, 11 were associated with respiratory events.  Audio and video analysis did not show any abnormal or unusual movements, behaviors, phonations or vocalizations. The patient took 1 bathroom break. Mild snoring was noted. The EKG was in keeping with normal sinus rhythm (NSR).  RESPIRATORY ANALYSIS:  There were a total of 50 respiratory events:  13 obstructive apneas, 11 central apneas and 0 mixed apneas with a total of 24 apneas and an apnea index (AI) of 5.3 /hour. There were 26 hypopneas with a hypopnea index of 5.8 /hour. The patient also had 0 respiratory event related arousals (RERAs).      The total APNEA/HYPOPNEA INDEX (AHI) was 11.1/hour and the total RESPIRATORY DISTURBANCE INDEX was 11.1 /hour.  7 events occurred in REM sleep and 49 events in NREM. The REM AHI was 9.2 /hour, versus a non-REM AHI of 11.5. The patient spent 0 minutes of total sleep time in the supine position and 270 minutes in non-supine.. The supine AHI was n/a versus a non-supine AHI of 11.1.  OXYGEN SATURATION & C02:  The Wake baseline 02 saturation was 94%, with the lowest being 86%. Time spent below 89% saturation equaled 3 minutes.  PERIODIC LIMB MOVEMENTS: The patient had a total of 32 Periodic Limb Movements.  The Periodic Limb Movement (PLM) index was 7.1 and the PLM Arousal index was .2/hour.  Post-study, the patient indicated that sleep was the same as usual.   IMPRESSION:  1. Obstructive Sleep Apnea (OSA) 2. Dysfunctions associated with sleep stages or arousal  from sleep  RECOMMENDATIONS:  1. This study demonstrates overall mild obstructive sleep apnea, with a total AHI of 11.1/hour, REM AHI of 9.2/hour, and O2 nadir of 86%. The absence of supine sleep likely underestimates his AHI and O2 nadir. Given the patient's medical history and sleep related complaints, treatment with positive airway pressure is recommended; a full-night CPAP titration study is recommended to  optimize therapy. Other treatment options may include avoidance of supine sleep position along with weight loss, upper airway or jaw surgery in selected patients or the use of an oral appliance in certain patients. ENT evaluation and/or consultation with a maxillofacial surgeon or dentist may be feasible in some instances.    2. Please note that untreated obstructive sleep apnea may carry additional perioperative morbidity. Patients with significant obstructive sleep apnea should receive perioperative PAP therapy and the surgeons and particularly the anesthesiologist should be informed of the diagnosis and the severity of the sleep disordered breathing. 3. This study shows sleep fragmentation and abnormal sleep stage percentages; these are nonspecific findings and per se do not signify an intrinsic sleep disorder or a cause for the patient's sleep-related symptoms. Causes include (but are not limited to) the first night effect of the sleep study, circadian rhythm disturbances, medication effect or an underlying mood disorder or medical problem.  4. The patient should be cautioned not to drive, work at heights, or operate dangerous or heavy equipment when tired or sleepy. Review and reiteration of good sleep hygiene measures should be pursued with any patient. 5. The patient will be seen in follow-up by Dr. Rexene Alberts at [facility] for discussion of the test results and further management strategies. The referring provider will be notified of the test results.  I certify that I have reviewed the entire raw data recording prior to the issuance of this report in accordance with the Standards of Accreditation of the American Academy of Sleep Medicine (AASM)   Star Age, MD, PhD Diplomat, American Board of Psychiatry and Neurology (Neurology and Sleep Medicine)

## 2018-01-27 NOTE — Progress Notes (Signed)
Patient referred by CM and Dr. [name], seen by me on 12/05/17, diagnostic PSG on 01/15/18.   Please call and notify the patient that the recent sleep study showed overall mild obstructive sleep apnea, with a total AHI of 11.1/hour, REM AHI of 9.2/hour, and O2 nadir of 86%. The absence of supine sleep likely underestimates his AHI and O2 nadir. Given the patient's medical history and sleep related complaints, I recommend treatment in the form of CPAP. This will require a repeat sleep study for proper titration and mask fitting and correct monitoring of the oxygen saturations. Please explain to patient. I have placed an order in the chart. Thanks.  Star Age, MD, PhD [facility])

## 2018-01-28 DIAGNOSIS — L578 Other skin changes due to chronic exposure to nonionizing radiation: Secondary | ICD-10-CM | POA: Diagnosis not present

## 2018-01-28 DIAGNOSIS — L570 Actinic keratosis: Secondary | ICD-10-CM | POA: Diagnosis not present

## 2018-01-28 DIAGNOSIS — L821 Other seborrheic keratosis: Secondary | ICD-10-CM | POA: Diagnosis not present

## 2018-01-28 DIAGNOSIS — C44622 Squamous cell carcinoma of skin of right upper limb, including shoulder: Secondary | ICD-10-CM | POA: Diagnosis not present

## 2018-01-30 DIAGNOSIS — H26493 Other secondary cataract, bilateral: Secondary | ICD-10-CM | POA: Diagnosis not present

## 2018-01-30 DIAGNOSIS — E113293 Type 2 diabetes mellitus with mild nonproliferative diabetic retinopathy without macular edema, bilateral: Secondary | ICD-10-CM | POA: Diagnosis not present

## 2018-01-31 ENCOUNTER — Telehealth: Payer: Self-pay | Admitting: Cardiovascular Disease

## 2018-01-31 NOTE — Telephone Encounter (Signed)
New message    Pt c/o BP issue: STAT if pt c/o blurred vision, one-sided weakness or slurred speech  1. What are your last 5 BP readings? 194/94 HR64, 196/89 HR64, 171/77, 160/105, 174/89, 173/85  2. Are you having any other symptoms (ex. Dizziness, headache, blurred vision, passed out)? NO  3. What is your BP issue?  Patient states his primary care doctor feels BP too high  No chest pain No shortness of breath

## 2018-01-31 NOTE — Telephone Encounter (Signed)
Returned call to patient he stated B/P elevated as listed below.Stated PCP wanted Dr.Croitoru to make medication changes.Dr.Croitoru out of office this afternoon. Appointment scheduled with B/P clinic 02/13/18 at 9:30 am.Advised to bring all medications and a list of B/P readings.

## 2018-02-10 DIAGNOSIS — M25511 Pain in right shoulder: Secondary | ICD-10-CM | POA: Diagnosis not present

## 2018-02-10 DIAGNOSIS — M7541 Impingement syndrome of right shoulder: Secondary | ICD-10-CM | POA: Diagnosis not present

## 2018-02-11 DIAGNOSIS — K4020 Bilateral inguinal hernia, without obstruction or gangrene, not specified as recurrent: Secondary | ICD-10-CM | POA: Diagnosis not present

## 2018-02-13 ENCOUNTER — Encounter: Payer: Self-pay | Admitting: Pharmacist Clinician (PhC)/ Clinical Pharmacy Specialist

## 2018-02-13 ENCOUNTER — Ambulatory Visit (INDEPENDENT_AMBULATORY_CARE_PROVIDER_SITE_OTHER): Payer: PPO | Admitting: Pharmacist Clinician (PhC)/ Clinical Pharmacy Specialist

## 2018-02-13 VITALS — BP 204/90 | HR 66

## 2018-02-13 DIAGNOSIS — I10 Essential (primary) hypertension: Secondary | ICD-10-CM | POA: Diagnosis not present

## 2018-02-13 MED ORDER — CLONIDINE HCL 0.1 MG PO TABS
0.1000 mg | ORAL_TABLET | ORAL | Status: AC
  Administered 2018-02-13: 0.1 mg via ORAL

## 2018-02-13 NOTE — Progress Notes (Signed)
Once daily meds is definitely the way to go. He has a lot of apathy since his stroke. Thank you MCr

## 2018-02-13 NOTE — Progress Notes (Signed)
02/13/2018 Mark Stewart 03/10/1946 [id number]   HPI:  ANDREW BLASIUS is a 72 y.o. male patient of Dr Sallyanne Kuster, with a PMH below who presents today for hypertension clinic evaluation.  In addition to hypertension, his medical history is significant for CVA, hyperlipidemia and poorly controlled DM2.  He has a 20+ year history of hypertension, currently on maximum doses of 4/5 medications.  He also has some issues with memory and compliance, and in the past has admitted that he changes his clonidine patch every 8-10 days.  He also wears a V-Go insulin pump and admits that some days he doesn't remember to replace it.    Today in the office he states feeling well, although his home blood pressure readings have been running as high as 200/100.  See readings below.  In reviewing medications, he breaks his Exforge and atorvastatin tablets in half and takes 1/2 tablet twice daily.  He is not sure who told him to do this, and I suspect that with his poor compliance issues, he may not be getting a full dose each day.  I called to his pharmacy and they report a 90 day supply of Exforge was filled in October 2018, then next on December 10, 2017.  This leads me to believe that he is either taking the 1/2 tablet only once daily or he misses multiple doses.    According to patient and wife, his alarm goes off around 7 am, however he is rarely awake when  she leaves around 9 am for work.  He later meets her at her flower shop and spends the afternoon there.  They go out for dinner each night before gettng home around 8 pm.   He states he takes his meds when he gets up, then again around 10-11 pm.   Other than complaints of napping "all day long", he has no specific concerns today.    Blood Pressure Goal:  130/80  Current Medications:  Amlodipine/valsartan/hctz 10/320/25 - 1/2 tab bid  Carvedilol 50 mg bid  Clonidine patch 0.2 mg   Family Hx:  Father with hypertension - died at 62 (diabetic, death due to  "heart stopping")   Mother died from cancer at 7  2 sisters, 1 died from cancer; 1 healthy  2 children - no known heart disease   Social Hx:  No tobacco, quit 40 years ago; no alcohol; drinks diet coke throughout day (3 glasses at meals alone); occasional coffee  Diet:  Eat out most meals; no added salt, likes mayonaise; some veggies (corn, potatoes), occasional green beans; salad most meals with ranch dressing  Exercise:  None, 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  Average of last 12 readings (over 8 days) is 194/103.    Intolerances:   Benazepril caused cough Labs:  01/06/18:  Na 139, K 3.6, Glu 261, SCr 0.94, BUN 18  Wt Readings from Last 3 Encounters:  12/05/17 192 lb (87.1 kg)  10/25/17 195 lb (88.5 kg)  07/19/17 193 lb (87.5 kg)   BP Readings from Last 3 Encounters:  02/13/18 (!) 204/90  12/05/17 (!) 176/72  10/25/17 116/69   Pulse Readings from Last 3 Encounters:  02/13/18 66  12/05/17 80  10/25/17 63    Current Outpatient Medications  Medication Sig Dispense Refill  . Amlodipine-Valsartan-HCTZ 10-320-25 MG TABS TAKE 1/2 TABLET BY MOUTH  2 TIMES DAILY 30 tablet 10  . aspirin EC 325 MG tablet Take 1 tablet (325 mg total) by mouth daily. 30 tablet 0  . atorvastatin (LIPITOR) 80 MG tablet Take 80 mg by mouth daily.    Marland Kitchen buPROPion (WELLBUTRIN SR) 100 MG 12 hr tablet Take 100 mg by mouth daily.    . carvedilol (COREG) 25 MG tablet TAKE 2 TABLETS (50 MG TOTAL) BY MOUTH 2 (TWO) TIMES DAILY. 120 tablet 7  . cloNIDine (CATAPRES - DOSED IN MG/24 HR) 0.2 mg/24hr patch Place 1 patch (0.2 mg total) onto the skin once a week. 4 patch 12  . empagliflozin (JARDIANCE) 25 MG TABS tablet Take 25 mg by mouth daily.     . fluticasone (FLONASE) 50 MCG/ACT nasal spray Place 2 sprays into both nostrils daily as needed for allergies or rhinitis. Reported on 04/26/2016    . insulin degludec  (TRESIBA) 100 UNIT/ML SOPN FlexTouch Pen Inject 26 Units into the skin daily.    . meloxicam (MOBIC) 15 MG tablet Take 15 mg by mouth daily.     No current facility-administered medications for this visit.     Allergies  Allergen Reactions  . Benazepril Cough  . Penicillins Rash    Has patient had a PCN reaction causing immediate rash, facial/tongue/throat swelling, SOB or lightheadedness with hypotension: yes Has patient had a PCN reaction causing severe rash involving mucus membranes or skin necrosis: no Has patient had a PCN reaction that required hospitalization: no Has patient had a PCN reaction occurring within the last 10 years: no If all of the above answers are "NO", then may proceed with Cephalosporin use.     Past Medical History:  Diagnosis Date  . Allergy   . Cancer ([place])    skin, hand  . Cataract    history of surgery bilaterally  . Diabetes mellitus without complication ([place])   . Hypercholesteremia   . Hypertension   . 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 Stroke ([place]) 02/2016    Blood pressure (!) 204/90, pulse 66.  Hypertension Patient with very poorly controlled hypertension, here today with office BP of 204/90.  In talking with patient and reviewing history, I think much of his problem due to compliance.  His last 90 day supply of Exforge lasted him 6 months per pharmacy.  I have asked him to stop cutting that tablet in half, but to take a whole tablet every night.  I prefer evenings, as his wife is home and may be able to help monitor.  He was given clonidine 0.1 mg in the office today and stayed for 30 minutes after that tablet.  Systolic dropped only to 295, but patient was feeling fine and aware of the situation.  He will return in 3 weeks for follow up and was asked to put his meds in daily pill minders as well as mark his BP chart when he takes each medication.      Tommy Medal PharmD CPP [facility] [address] [place],  [address] [phone]

## 2018-02-13 NOTE — Patient Instructions (Addendum)
Return for a a follow up appointment in 3 weeks  Your blood pressure today is 204/90  Check your blood pressure at home daily (if able) and keep record of the readings.  Take your BP meds as follows:  10 AM: carvedilol - 2 tablets   10 PM:  Amlodipine/valsartan/hctz, atorvastatin - take whole tablet once daily in the evenings   Carvedilol - 2 tablets  Bring all of your meds, your BP cuff and your record of home blood pressures to your next appointment.  Exercise as you're able, try to walk approximately 30 minutes per day.  Keep salt intake to a minimum, especially watch canned and prepared boxed foods.  Eat more fresh fruits and vegetables and fewer canned items.  Avoid eating in fast food restaurants.    HOW TO TAKE YOUR BLOOD PRESSURE: . Rest 5 minutes before taking your blood pressure. .  Don't smoke or drink caffeinated beverages for at least 30 minutes before. . Take your blood pressure before (not after) you eat. . Sit comfortably with your back supported and both feet on the floor (don't cross your legs). . Elevate your arm to heart level on a table or a desk. . Use the proper sized cuff. It should fit smoothly and snugly around your bare upper arm. There should be enough room to slip a fingertip under the cuff. The bottom edge of the cuff should be 1 inch above the crease of the elbow. . Ideally, take 3 measurements at one sitting and record the average.   Sunday Monday Tuesday Wednesday Thursday Friday Saturday        9 10 11  12  13 14 15 16 17 18  19  20 21 22 23 24 25  26  27 28 29  30

## 2018-02-13 NOTE — Assessment & Plan Note (Addendum)
Patient with very poorly controlled hypertension, here today with office BP of 204/90.  In talking with patient and reviewing history, I think much of his problem due to compliance.  His last 90 day supply of Exforge lasted him 6 months per pharmacy.  I have asked him to stop cutting that tablet in half, but to take a whole tablet every night.  I prefer evenings, as his wife is home and may be able to help monitor.  He was given clonidine 0.1 mg in the office today and stayed for 30 minutes after that tablet.  Systolic dropped only to 117, but patient was feeling fine and aware of the situation.  He will return in 3 weeks for follow up and was asked to put his meds in daily pill minders as well as mark his BP chart when he takes each medication.

## 2018-02-14 DIAGNOSIS — M7541 Impingement syndrome of right shoulder: Secondary | ICD-10-CM | POA: Diagnosis not present

## 2018-02-14 DIAGNOSIS — M25511 Pain in right shoulder: Secondary | ICD-10-CM | POA: Diagnosis not present

## 2018-02-18 DIAGNOSIS — K4020 Bilateral inguinal hernia, without obstruction or gangrene, not specified as recurrent: Secondary | ICD-10-CM | POA: Diagnosis not present

## 2018-02-18 DIAGNOSIS — R1031 Right lower quadrant pain: Secondary | ICD-10-CM | POA: Diagnosis not present

## 2018-02-19 DIAGNOSIS — M7541 Impingement syndrome of right shoulder: Secondary | ICD-10-CM | POA: Diagnosis not present

## 2018-02-19 DIAGNOSIS — M25511 Pain in right shoulder: Secondary | ICD-10-CM | POA: Diagnosis not present

## 2018-02-21 DIAGNOSIS — I10 Essential (primary) hypertension: Secondary | ICD-10-CM | POA: Diagnosis not present

## 2018-02-21 DIAGNOSIS — Z794 Long term (current) use of insulin: Secondary | ICD-10-CM | POA: Diagnosis not present

## 2018-02-21 DIAGNOSIS — E118 Type 2 diabetes mellitus with unspecified complications: Secondary | ICD-10-CM | POA: Diagnosis not present

## 2018-02-21 DIAGNOSIS — Z9641 Presence of insulin pump (external) (internal): Secondary | ICD-10-CM | POA: Diagnosis not present

## 2018-02-21 DIAGNOSIS — E7849 Other hyperlipidemia: Secondary | ICD-10-CM | POA: Diagnosis not present

## 2018-02-24 DIAGNOSIS — Z0000 Encounter for general adult medical examination without abnormal findings: Secondary | ICD-10-CM | POA: Diagnosis not present

## 2018-02-26 ENCOUNTER — Ambulatory Visit (INDEPENDENT_AMBULATORY_CARE_PROVIDER_SITE_OTHER): Payer: PPO | Admitting: Neurology

## 2018-02-26 DIAGNOSIS — G4761 Periodic limb movement disorder: Secondary | ICD-10-CM

## 2018-02-26 DIAGNOSIS — R400 Somnolence: Secondary | ICD-10-CM

## 2018-02-26 DIAGNOSIS — G4720 Circadian rhythm sleep disorder, unspecified type: Secondary | ICD-10-CM

## 2018-02-26 DIAGNOSIS — I6350 Cerebral infarction due to unspecified occlusion or stenosis of unspecified cerebral artery: Secondary | ICD-10-CM

## 2018-02-26 DIAGNOSIS — E663 Overweight: Secondary | ICD-10-CM

## 2018-02-26 DIAGNOSIS — M7541 Impingement syndrome of right shoulder: Secondary | ICD-10-CM | POA: Diagnosis not present

## 2018-02-26 DIAGNOSIS — G4733 Obstructive sleep apnea (adult) (pediatric): Secondary | ICD-10-CM

## 2018-02-26 DIAGNOSIS — M25511 Pain in right shoulder: Secondary | ICD-10-CM | POA: Diagnosis not present

## 2018-02-27 ENCOUNTER — Telehealth: Payer: Self-pay

## 2018-02-27 NOTE — Progress Notes (Signed)
Patient referred by CM and Dr. [name], seen by me on 12/05/17, diagnostic PSG on 01/15/18. Patient had a CPAP titration study on 02/26/18.  Please call and inform patient that I have entered an order for treatment with positive airway pressure (PAP) treatment for obstructive sleep apnea (OSA). He did well during the latest sleep study with CPAP. We will, therefore, arrange for a machine for home use through a DME (durable medical equipment) company of His choice; and I will see the patient back in follow-up in about 10 weeks. Please also explain to the patient that I will be looking out for compliance data, which can be downloaded from the machine (stored on an SD card, that is inserted in the machine) or via remote access through a modem, that is built into the machine. At the time of the followup appointment we will discuss sleep study results and how it is going with PAP treatment at home. Please advise patient to bring His machine at the time of the first FU visit, even though this is cumbersome. Bringing the machine for every visit after that will likely not be needed, but often helps for the first visit to troubleshoot if needed. Please re-enforce the importance of compliance with treatment and the need for [place] to monitor compliance data - often an insurance requirement and actually good feedback for the patient as far as how they are doing.  Also remind patient, that any interim PAP machine or mask issues should be first addressed with the DME company, as they can often help better with technical and mask fit issues. Please ask if patient has a preference regarding DME company.  Please also make sure, the patient has a follow-up appointment with me in about 10 weeks from the setup date, thanks. May see one of our nurse practitioners if needed for proper timing of the FU appointment.  Please fax or rout report to the referring provider. Thanks,   Star Age, MD, PhD [facility])

## 2018-02-27 NOTE — Procedures (Signed)
PATIENT'S NAME:  Mark Stewart, Mark Stewart DOB:      05-17-1946      MR#:    [id number]     DATE OF RECORDING: 02/26/2018 REFERRING M.D.:  Rosalin Hawking, MD Study Performed:   CPAP  Titration HISTORY: 72 year old man with a history of hypertension, hyperlipidemia, status post splenectomy as a teenager, stroke in 2017, diabetes, cataracts, allergies, and overweight state, who returns for a CPAP titration after having a baseline PSG on 01/15/18, which showed overall mild obstructive sleep apnea, with a total AHI of 11.1/hour, REM AHI of 9.2/hour, and O2 nadir of 86% and absence of supine sleep. The patient endorsed the Epworth Sleepiness Scale at 23/24. The patient's weight 192 pounds with a height of 72 (inches), resulting in a BMI of 26. kg/m2. The patient's neck circumference measured 15.5 inches.  CURRENT MEDICATIONS: Amlodipine-Valsartan, Aspirin, Lipitor, Wellbutrin, Coreg, Catapres, Jardiance, Flonase, Tresiba, Mobic.  PROCEDURE:  This is a multichannel digital polysomnogram utilizing the SomnoStar 11.2 system.  Electrodes and sensors were applied and monitored per AASM Specifications.   EEG, EOG, Chin and Limb EMG, were sampled at 200 Hz.  ECG, Snore and Nasal Pressure, Thermal Airflow, Respiratory Effort, CPAP Flow and Pressure, Oximetry was sampled at 50 Hz. Digital video and audio were recorded.      The patient was fitted with a medium ResMed Airtouch FFM. CPAP was initiated at 5 cmH20 with heated humidity per AASM split night standards and pressure was advanced to 15 cmH20 because of hypopneas, apneas and desaturations.  At a PAP pressure of 15 cmH20, there was a reduction of the AHI to 0 with supine REM sleep achieved and O2 nadir of 91%.  Lights Out was at 22:21 and Lights On at 04:47. Total recording time (TRT) was 386.5 minutes, with a total sleep time (TST) of 310 minutes. The patient's sleep latency was 11 minutes. REM latency was 67.5 minutes. The sleep efficiency was 80.2 %.    SLEEP ARCHITECTURE:  WASO (Wake after sleep onset) was 71 minutes with mild to moderate sleep fragmentation noted. There were 33 minutes in Stage N1, 173.5 minutes Stage N2, 43.5 minutes Stage N3 and 60 minutes in Stage REM.  The percentage of Stage N1 was 10.6%, Stage N2 was 56.%, Stage N3 was 14.% and Stage R (REM sleep) was 19.4%. The arousals were noted as: 43 were spontaneous, 14 were associated with PLMs, 25 were associated with respiratory events.  RESPIRATORY ANALYSIS:  There was a total of 48 respiratory events: 48 obstructive apneas, 0 central apneas and 0 mixed apneas with a total of 48 apneas and an apnea index (AI) of 9.3 /hour. There were 0 hypopneas with a hypopnea index of 0/hour. The patient also had 0 respiratory event related arousals (RERAs).      The total APNEA/HYPOPNEA INDEX  (AHI) was 9.3/hour and the total RESPIRATORY DISTURBANCE INDEX was 9.3 0./hour  0 events occurred in REM sleep and 48 events in NREM. The REM AHI was 0 /hour versus a non-REM AHI of 11.52 0./hour.  The patient spent 282.5 minutes of total sleep time in the supine position and 28 minutes in non-supine. The supine AHI was 10.2, versus a non-supine AHI of 0.0.  OXYGEN SATURATION & C02:  The baseline 02 saturation was 94%, with the lowest being 90%. Time spent below 89% saturation equaled 0 minutes.  PERIODIC LIMB MOVEMENTS:  The patient had a total of 117 Periodic Limb Movements. The Periodic Limb Movement (PLM) index was 22.6 and the  PLM Arousal index was 2.7 /hour.   Audio and video analysis did not show any abnormal or unusual movements, behaviors, phonations or vocalizations. The patient took 1 bathroom break. The EKG was in keeping with normal sinus rhythm (NSR).  Post-study, the patient indicated that sleep was worse than usual.   IMPRESSION: 1. Obstructive Sleep Apnea (OSA) 2. Dysfunctions associated with sleep stages or arousal from sleep 3. Periodic Limb movement disorder   RECOMMENDATIONS: 1. This study demonstrates  resolution of the patient's obstructive sleep apnea with CPAP therapy. I will, therefore, start the patient on home CPAP treatment at a pressure of 15 cm via medium FFM with heated humidity. The patient should be reminded to be fully compliant with PAP therapy to improve sleep related symptoms and decrease long term cardiovascular risks. The patient should be reminded, that it may take up to 3 months to get fully used to using PAP with all planned sleep. The earlier full compliance is achieved, the better long term compliance tends to be. Please note that untreated obstructive sleep apnea may carry additional perioperative morbidity. Patients with significant obstructive sleep apnea should receive perioperative PAP therapy and the surgeons and particularly the anesthesiologist should be informed of the diagnosis and the severity of the sleep disordered breathing. 2. Mild PLMs (periodic limb movements of sleep) were noted during this study with minimal arousals; clinical correlation is recommended.  3. This study shows sleep fragmentation and abnormal sleep stage percentages; these are nonspecific findings and per se do not signify an intrinsic sleep disorder or a cause for the patient's sleep-related symptoms. Causes include (but are not limited to) the first night effect of the sleep study, circadian rhythm disturbances, medication effect or an underlying mood disorder or medical problem.  4. The patient should be cautioned not to drive, work at heights, or operate dangerous or heavy equipment when tired or sleepy. Review and reiteration of good sleep hygiene measures should be pursued with any patient. 5. The patient will be seen in follow-up by Dr. Rexene Alberts at [facility] for discussion of the test results and further management strategies. The referring provider will be notified of the test results.   I certify that I have reviewed the entire raw data recording prior to the issuance of this report in accordance with  the Standards of Accreditation of the American Academy of Sleep Medicine (AASM)     Star Age, MD, PhD Diplomat, American Board of Psychiatry and Neurology (Neurology and Sleep Medicine)

## 2018-02-27 NOTE — Addendum Note (Signed)
Addended by: Star Age on: 02/27/2018 08:29 AM   Modules accepted: Orders

## 2018-02-27 NOTE — Telephone Encounter (Signed)
-----   Message from Star Age, MD sent at 02/27/2018  8:29 AM EDT ----- Patient referred by CM and Dr. [name], seen by me on 12/05/17, diagnostic PSG on 01/15/18. Patient had a CPAP titration study on 02/26/18.  Please call and inform patient that I have entered an order for treatment with positive airway pressure (PAP) treatment for obstructive sleep apnea (OSA). He did well during the latest sleep study with CPAP. We will, therefore, arrange for a machine for home use through a DME (durable medical equipment) company of His choice; and I will see the patient back in follow-up in about 10 weeks. Please also explain to the patient that I will be looking out for compliance data, which can be downloaded from the machine (stored on an SD card, that is inserted in the machine) or via remote access through a modem, that is built into the machine. At the time of the followup appointment we will discuss sleep study results and how it is going with PAP treatment at home. Please advise patient to bring His machine at the time of the first FU visit, even though this is cumbersome. Bringing the machine for every visit after that will likely not be needed, but often helps for the first visit to troubleshoot if needed. Please re-enforce the importance of compliance with treatment and the need for [place] to monitor compliance data - often an insurance requirement and actually good feedback for the patient as far as how they are doing.  Also remind patient, that any interim PAP machine or mask issues should be first addressed with the DME company, as they can often help better with technical and mask fit issues. Please ask if patient has a preference regarding DME company.  Please also make sure, the patient has a follow-up appointment with me in about 10 weeks from the setup date, thanks. May see one of our nurse practitioners if needed for proper timing of the FU appointment.  Please fax or rout report to the referring provider.  Thanks,   Star Age, MD, PhD [facility])

## 2018-02-27 NOTE — Telephone Encounter (Signed)
I called pt. I advised pt that Dr. Rexene Alberts reviewed their sleep study results and found that pt did well with the cpap during his latest study. Dr. Rexene Alberts recommends that pt start a cpap at home. I reviewed PAP compliance expectations with the pt. Pt is agreeable to starting a CPAP. I advised pt that an order will be sent to a DME, [facility], and [facility] will call the pt within about one week after they file with the pt's insurance. [facility] will show the pt how to use the machine, fit for masks, and troubleshoot the CPAP if needed. A follow up appt was made for insurance purposes with Dr. Rexene Alberts on 05/29/18 at 9:30am. Pt verbalized understanding to arrive 15 minutes early and bring their CPAP. A letter with all of this information in it will be mailed to the pt as a reminder. I verified with the pt that the address we have on file is correct. Pt verbalized understanding of results. Pt had no questions at this time but was encouraged to call back if questions arise.

## 2018-02-28 DIAGNOSIS — E876 Hypokalemia: Secondary | ICD-10-CM | POA: Diagnosis not present

## 2018-02-28 DIAGNOSIS — E785 Hyperlipidemia, unspecified: Secondary | ICD-10-CM | POA: Diagnosis not present

## 2018-02-28 DIAGNOSIS — E7849 Other hyperlipidemia: Secondary | ICD-10-CM | POA: Diagnosis not present

## 2018-02-28 DIAGNOSIS — Z01818 Encounter for other preprocedural examination: Secondary | ICD-10-CM | POA: Diagnosis not present

## 2018-02-28 DIAGNOSIS — Z794 Long term (current) use of insulin: Secondary | ICD-10-CM | POA: Diagnosis not present

## 2018-02-28 DIAGNOSIS — K4020 Bilateral inguinal hernia, without obstruction or gangrene, not specified as recurrent: Secondary | ICD-10-CM | POA: Diagnosis not present

## 2018-02-28 DIAGNOSIS — E119 Type 2 diabetes mellitus without complications: Secondary | ICD-10-CM | POA: Diagnosis not present

## 2018-02-28 DIAGNOSIS — F331 Major depressive disorder, recurrent, moderate: Secondary | ICD-10-CM | POA: Diagnosis not present

## 2018-02-28 DIAGNOSIS — Z79899 Other long term (current) drug therapy: Secondary | ICD-10-CM | POA: Diagnosis not present

## 2018-02-28 DIAGNOSIS — Z87891 Personal history of nicotine dependence: Secondary | ICD-10-CM | POA: Diagnosis not present

## 2018-02-28 DIAGNOSIS — Z01810 Encounter for preprocedural cardiovascular examination: Secondary | ICD-10-CM | POA: Diagnosis not present

## 2018-02-28 DIAGNOSIS — I10 Essential (primary) hypertension: Secondary | ICD-10-CM | POA: Diagnosis not present

## 2018-02-28 DIAGNOSIS — Z8673 Personal history of transient ischemic attack (TIA), and cerebral infarction without residual deficits: Secondary | ICD-10-CM | POA: Diagnosis not present

## 2018-02-28 DIAGNOSIS — Z7982 Long term (current) use of aspirin: Secondary | ICD-10-CM | POA: Diagnosis not present

## 2018-02-28 HISTORY — PX: HERNIA REPAIR: SHX51

## 2018-03-06 ENCOUNTER — Ambulatory Visit (INDEPENDENT_AMBULATORY_CARE_PROVIDER_SITE_OTHER): Payer: PPO | Admitting: Pharmacist Clinician (PhC)/ Clinical Pharmacy Specialist

## 2018-03-06 VITALS — BP 208/96 | HR 68

## 2018-03-06 DIAGNOSIS — I10 Essential (primary) hypertension: Secondary | ICD-10-CM

## 2018-03-06 MED ORDER — SPIRONOLACTONE 25 MG PO TABS: 25.0000 mg | ORAL_TABLET | Freq: Every day | ORAL | 1 refills | Status: DC

## 2018-03-06 NOTE — Patient Instructions (Addendum)
Return for a a follow up appointment in 3 weeks  Your blood pressure today is 208/96  Check your blood pressure at home daily and keep record of the readings.  Take your BP meds as follows:  Amlodipine/valsartan/hctz 10/320/25 - 1 tablet daily  Carvedilol 50 mg bid  Clonidine patch 0.2 mg   Spironolactone 25 mg once daily  Bring all of your meds, your BP cuff and your record of home blood pressures to your next appointment.  Exercise as you're able, try to walk approximately 30 minutes per day.  Keep salt intake to a minimum, especially watch canned and prepared boxed foods.  Eat more fresh fruits and vegetables and fewer canned items.  Avoid eating in fast food restaurants.    HOW TO TAKE YOUR BLOOD PRESSURE: . Rest 5 minutes before taking your blood pressure. .  Don't smoke or drink caffeinated beverages for at least 30 minutes before. . Take your blood pressure before (not after) you eat. . Sit comfortably with your back supported and both feet on the floor (don't cross your legs). . Elevate your arm to heart level on a table or a desk. . Use the proper sized cuff. It should fit smoothly and snugly around your bare upper arm. There should be enough room to slip a fingertip under the cuff. The bottom edge of the cuff should be 1 inch above the crease of the elbow. . Ideally, take 3 measurements at one sitting and record the average.

## 2018-03-06 NOTE — Progress Notes (Signed)
03/07/2018 Mark Stewart [birth year] [id number]   HPI:  Mark Stewart is a 72 y.o. male patient of Dr Sallyanne Kuster, with a PMH below who presents today for hypertension clinic evaluation.  In addition to hypertension, his medical history is significant for CVA, hyperlipidemia and poorly controlled DM2.  He has a 20+ year history of hypertension, currently on maximum doses of 4/5 medications.  He also has some issues with memory and compliance, and in the past has admitted that he changes his clonidine patch every 8-10 days.  He also wears a V-Go insulin pump and admits that some days he doesn't remember to replace it.    Today in the office he states feeling well, although his home blood pressure readings have still been running as high as 200/100.  See readings below.  We have had multiple discussions with him about compliance and he does not seem to be moved by our concerns about his pressure.  At his last visit we asked that he let his wife put his meds in a 7 day pill minder.  He has not yet done this, as he states "she has enough to do without worrying about my meds".  She is also in the room, apparently he has not let her do this, as she is shaking her head in frustration.  When asked about his clonidine patch, he states he replaced it this morning.  I asked if he replaced it every Thursday and he responded that he usually changes it on Tuesdays, but he had trouble getting his prescription refilled.     At his last visit we also talked about taking his Exforge just 1 tablet daily rather than 1/2 twice daily.  He stated that he started taking 1 tablet daily, but later said he follows the directions on the bottle, and breaks them in half.  Even his wife is unsure of what he is doing.  He continues to see home BP readings consistently around 947-654 systolic, although he has had 2 that were 650 and 354 systolic.    Blood Pressure Goal:  130/80  Current Medications:  Amlodipine/valsartan/hctz  10/320/25 - 1/2 tab bid  Carvedilol 50 mg bid  Clonidine patch 0.2 mg   Family Hx:  Father with hypertension - died at 47 (diabetic, death due to "heart stopping")   Mother died from cancer at 70  2 sisters, 1 died from cancer; 1 healthy  2 children - no known heart disease   Social Hx:  No tobacco, quit 40 years ago; no alcohol; drinks diet coke throughout day (3 glasses at meals alone); occasional coffee  Diet:  Eat out most meals; no added salt, likes mayonaise; some veggies (corn, potatoes), occasional green beans; salad most meals with ranch dressing  Exercise:  None, 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  Average of last 12 readings (over 8 days) is 194/103.    Intolerances:   Benazepril caused cough Labs:  01/06/18:  Na 139, K 3.6, Glu 261, SCr 0.94, BUN 18  Wt Readings from Last 3 Encounters:  12/05/17 192 lb (87.1 kg)  10/25/17 195 lb (88.5 kg)  07/19/17 193 lb (87.5 kg)   BP Readings from Last 3 Encounters:  03/06/18 (!) 208/96  02/13/18 (!) 204/90  12/05/17 (!) 176/72   Pulse Readings from Last 3 Encounters:  03/06/18 68  02/13/18 66  12/05/17 80    Current Outpatient Medications  Medication Sig Dispense Refill  . buPROPion (WELLBUTRIN XL) 150 MG 24 hr tablet Take 150 mg by mouth daily.    . insulin aspart (NOVOLOG) 100 UNIT/ML injection Inject 30 Units into the skin 3 (three) times daily before meals. Uses V-Go 30 units per day sq infustion    . Amlodipine-Valsartan-HCTZ 10-320-25 MG TABS TAKE 1/2 TABLET BY MOUTH 2 TIMES DAILY 30 tablet 10  . aspirin EC 325 MG tablet Take 1 tablet (325 mg total) by mouth daily. 30 tablet 0  . atorvastatin (LIPITOR) 80 MG tablet Take 80 mg by mouth daily.    . carvedilol (COREG) 25 MG tablet TAKE 2 TABLETS (50 MG TOTAL) BY MOUTH 2 (TWO) TIMES DAILY. 120 tablet 7  . cloNIDine (CATAPRES - DOSED IN MG/24 HR) 0.2 mg/24hr patch  Place 1 patch (0.2 mg total) onto the skin once a week. 4 patch 12  . empagliflozin (JARDIANCE) 25 MG TABS tablet Take 25 mg by mouth daily.     . fluticasone (FLONASE) 50 MCG/ACT nasal spray Place 2 sprays into both nostrils daily as needed for allergies or rhinitis. Reported on 04/26/2016    . spironolactone (ALDACTONE) 25 MG tablet Take 1 tablet (25 mg total) by mouth daily. 30 tablet 1   No current facility-administered medications for this visit.     Allergies  Allergen Reactions  . Benazepril Cough  . Penicillins Rash    Has patient had a PCN reaction causing immediate rash, facial/tongue/throat swelling, SOB or lightheadedness with hypotension: yes Has patient had a PCN reaction causing severe rash involving mucus membranes or skin necrosis: no Has patient had a PCN reaction that required hospitalization: no Has patient had a PCN reaction occurring within the last 10 years: no If all of the above answers are "NO", then may proceed with Cephalosporin use.     Past Medical History:  Diagnosis Date  . Allergy   . Cancer ([place])    skin, hand  . Cataract    history of surgery bilaterally  . Diabetes mellitus without complication ([place])   . Hypercholesteremia   . Hypertension   . 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 Stroke ([place]) 02/2016    Blood pressure (!) 208/96, pulse 68.  Hypertension Patient with long history of non-compliance, mainly due to confusion and apathy.  Stressed to him again the need to let his wife control his medications.  Explained that if she controls his medication he will most likely get his pressure down and decrease the risk of stroke.  Because he continues to be so hypertensive, I am going to add spironolactone 25 mg once daily.  Will have him repeat BMET in 10-14 days and see him back in the office in 3 weeks for follow up.    Tommy Medal PharmD CPP [facility] [address] [place], [place] [address] [phone]

## 2018-03-07 ENCOUNTER — Encounter: Payer: Self-pay | Admitting: Pharmacist Clinician (PhC)/ Clinical Pharmacy Specialist

## 2018-03-07 NOTE — Progress Notes (Signed)
We'll keep on trying. Thanks [employer]

## 2018-03-07 NOTE — Assessment & Plan Note (Signed)
Patient with long history of non-compliance, mainly due to confusion and apathy.  Stressed to him again the need to let his wife control his medications.  Explained that if she controls his medication he will most likely get his pressure down and decrease the risk of stroke.  Because he continues to be so hypertensive, I am going to add spironolactone 25 mg once daily.  Will have him repeat BMET in 10-14 days and see him back in the office in 3 weeks for follow up.

## 2018-03-11 DIAGNOSIS — Z09 Encounter for follow-up examination after completed treatment for conditions other than malignant neoplasm: Secondary | ICD-10-CM | POA: Insufficient documentation

## 2018-03-27 ENCOUNTER — Encounter: Payer: Self-pay | Admitting: Pharmacist Clinician (PhC)/ Clinical Pharmacy Specialist

## 2018-03-27 ENCOUNTER — Ambulatory Visit (INDEPENDENT_AMBULATORY_CARE_PROVIDER_SITE_OTHER): Payer: PPO | Admitting: Pharmacist Clinician (PhC)/ Clinical Pharmacy Specialist

## 2018-03-27 DIAGNOSIS — I10 Essential (primary) hypertension: Secondary | ICD-10-CM

## 2018-03-27 NOTE — Patient Instructions (Addendum)
Return for a a follow up appointment in 6 weeks  Your blood pressure today is 130/68 !!!  Check your blood pressure at home daily and keep record of the readings.  Take your BP meds as follows:  Continue with your current routine  Bring all of your meds, your BP cuff and your record of home blood pressures to your next appointment.  Exercise as you're able, try to walk approximately 30 minutes per day.  Keep salt intake to a minimum, especially watch canned and prepared boxed foods.  Eat more fresh fruits and vegetables and fewer canned items.  Avoid eating in fast food restaurants.    HOW TO TAKE YOUR BLOOD PRESSURE: . Rest 5 minutes before taking your blood pressure. .  Don't smoke or drink caffeinated beverages for at least 30 minutes before. . Take your blood pressure before (not after) you eat. . Sit comfortably with your back supported and both feet on the floor (don't cross your legs). . Elevate your arm to heart level on a table or a desk. . Use the proper sized cuff. It should fit smoothly and snugly around your bare upper arm. There should be enough room to slip a fingertip under the cuff. The bottom edge of the cuff should be 1 inch above the crease of the elbow. . Ideally, take 3 measurements at one sitting and record the average.

## 2018-03-27 NOTE — Assessment & Plan Note (Signed)
Patient with long term hypertension, finally looking better today.  Not sure if it was the addition of spironolactone or the fact that his wife is home, probably a mixture of the two.   Praised him on his good office reading as well as the 35/10 point drop in his home readings.  He is to continue with regular home blood pressure checks and we will follow up with him in about 6 weeks.   Offered to send his prescriptions to one of the independent pharmacies in [place], where they could bubble pack his medications to avoid confusion, but patient declined.

## 2018-03-27 NOTE — Progress Notes (Signed)
03/27/2018 Mark Stewart Nov 14, 1945 [id number]   HPI:  Mark Stewart is a 72 y.o. male patient of Dr Sallyanne Kuster, with a PMH below who presents today for hypertension clinic evaluation.  In addition to hypertension, his medical history is significant for CVA, hyperlipidemia and poorly controlled DM2.  He has a 20+ year history of hypertension, currently on maximum doses of 4/5 medications.  He also has some issues with memory and compliance, and in the past has admitted that he changes his clonidine patch every 8-10 days and has also forgotten to replace his V-Go pump on a daily basis.  He has been seen in the [facility] clinic multiple times over the past few months, but has continued to have a blood pressure close to 916 systolic.  We had asked that he let his wife help with his medications, to which she was agreeable, but he felt that she was too busy to ask her for help.    Since his last visit, about a month ago, his wife had a fall and broke her leg.  She spent 5 days in the hospital, and has now been home for about a week.  She is in the process of selling her flower shop and will retire.   He has been taking care of her, but I suspect that she is monitoring his medication intake as well.    Today he brought in all his medication bottles as well as his weekly pill minder.  The weekly minders were filled correctly, and his bottles all within a reasonable date to suggest compliance.  Last month we added spironolactone to his regimen.  He was to have gotten a metabolic panel after starting, but never did.     Blood Pressure Goal:  130/80  Current Medications:  Amlodipine/valsartan/hctz 10/320/25 - 1/2 tab bid  Carvedilol 50 mg bid  Clonidine patch 0.2 mg   Spironolactone 25 mg qd  Family Hx:  Father with hypertension - died at 58 (diabetic, death due to "heart stopping")   Mother died from cancer at 70  2 sisters, 1 died from cancer; 1 healthy  2 children - no known heart  disease   Social Hx:  No tobacco, quit 40 years ago; no alcohol; drinks diet coke throughout day (3 glasses at meals alone); occasional coffee  Diet:  Eat out most meals; no added salt, likes mayonaise; some veggies (corn, potatoes), occasional green beans; salad most meals with ranch dressing  Exercise:  None, 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  The 10 previous readings (with her in the hospital and before the fall) averaged 182/98.   Home cuff has been shown to be within 10 points of the office cuff.   Intolerances:   Benazepril caused cough Labs:  01/06/18:  Na 139, K 3.6, Glu 261, SCr 0.94, BUN 18  Wt Readings from Last 3 Encounters:  12/05/17 192 lb (87.1 kg)  10/25/17 195 lb (88.5 kg)  07/19/17 193 lb (87.5 kg)   BP Readings from Last 3 Encounters:  03/27/18 130/68  03/06/18 (!) 208/96  02/13/18 (!) 204/90   Pulse Readings from Last 3 Encounters:  03/27/18 72  03/06/18 68  02/13/18 66    Current Outpatient Medications  Medication Sig Dispense Refill  . Amlodipine-Valsartan-HCTZ 10-320-25 MG TABS TAKE 1/2 TABLET BY MOUTH 2 TIMES DAILY 30 tablet 10  .  aspirin EC 325 MG tablet Take 1 tablet (325 mg total) by mouth daily. 30 tablet 0  . atorvastatin (LIPITOR) 80 MG tablet Take 80 mg by mouth daily.    Marland Kitchen buPROPion (WELLBUTRIN XL) 150 MG 24 hr tablet Take 150 mg by mouth daily.    . carvedilol (COREG) 25 MG tablet TAKE 2 TABLETS (50 MG TOTAL) BY MOUTH 2 (TWO) TIMES DAILY. 120 tablet 7  . cloNIDine (CATAPRES - DOSED IN MG/24 HR) 0.2 mg/24hr patch Place 1 patch (0.2 mg total) onto the skin once a week. 4 patch 12  . empagliflozin (JARDIANCE) 25 MG TABS tablet Take 25 mg by mouth daily.     . fluticasone (FLONASE) 50 MCG/ACT nasal spray Place 2 sprays into both nostrils daily as needed for allergies or rhinitis. Reported on 04/26/2016    . insulin aspart (NOVOLOG) 100 UNIT/ML injection  Inject 30 Units into the skin 3 (three) times daily before meals. Uses V-Go 30 units per day sq infustion    . spironolactone (ALDACTONE) 25 MG tablet Take 1 tablet (25 mg total) by mouth daily. 30 tablet 1   No current facility-administered medications for this visit.     Allergies  Allergen Reactions  . Benazepril Cough  . Penicillins Rash    Has patient had a PCN reaction causing immediate rash, facial/tongue/throat swelling, SOB or lightheadedness with hypotension: yes Has patient had a PCN reaction causing severe rash involving mucus membranes or skin necrosis: no Has patient had a PCN reaction that required hospitalization: no Has patient had a PCN reaction occurring within the last 10 years: no If all of the above answers are "NO", then may proceed with Cephalosporin use.     Past Medical History:  Diagnosis Date  . Allergy   . Cancer ([place])    skin, hand  . Cataract    history of surgery bilaterally  . Diabetes mellitus without complication ([place])   . Hypercholesteremia   . Hypertension   . 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 Stroke ([place]) 02/2016    Blood pressure 130/68, pulse 72.  Hypertension Patient with long term hypertension, finally looking better today.  Not sure if it was the addition of spironolactone or the fact that his wife is home, probably a mixture of the two.   Praised him on his good office reading as well as the 35/10 point drop in his home readings.  He is to continue with regular home blood pressure checks and we will follow up with him in about 6 weeks.   Offered to send his prescriptions to one of the independent pharmacies in [place], where they could bubble pack his medications to avoid confusion, but patient declined.    Tommy Medal PharmD CPP [facility] [address] [place], [place] [address] [phone]

## 2018-03-28 DIAGNOSIS — G4733 Obstructive sleep apnea (adult) (pediatric): Secondary | ICD-10-CM | POA: Diagnosis not present

## 2018-03-28 NOTE — Progress Notes (Signed)
Thank you, Wonda Horner

## 2018-04-23 NOTE — Progress Notes (Signed)
[facility]  PATIENT: Mark Stewart DOB: 1946-10-06 patient   REASON FOR VISIT:  follow-up for right thalamic infarct secondary to small vessel disease, memory loss since stroke, risk factors of hypertension hyperlipidemia and  diabetes not well controlled and newly diagnosed obstructive sleep apnea  HISTORY FROM: Patient and wife    HISTORY OF PRESENT ILLNESS: UPDATE 7/18/2019CM Mark Stewart, 72 year old male returns for follow-up with history of right thalmic infarct secondary to small vessel disease in May 2018.  He  complains with more of memory loss since his stroke.  He has risk factors of hypertension hyperlipidemia diabetes which is not well controlled and recently diagnosed with obstructive sleep apnea.  He has been using his CPAP for approximately a month he also has a history of depression and is taking Wellbutrin.  He remains on aspirin for secondary stroke prevention with some bruising but no bleeding.  He has not had further stroke or TIA symptoms.  Most recent hemoglobin A1c Feb 21, 2018 8.7.  He does not follow a diabetic diet, he and his wife eat out.   He gets no regular exercise blood pressure in the office today 135/79.  He continues to follow with cardiology Dr.Croitoru.  He returns for reevaluation  UPDATE 10/25/17 CM Mark Stewart, 72 year old male returns for follow-up with a history of right thalmic infarct in May 2017.  He is currently on aspirin for secondary stroke prevention without further stroke or TIA symptoms.  He does have some bruising no bleeding.  He remains on Lipitor for secondary stroke prevention hyperlipidemia he denies myalgias.  He continues to complain with fatigue, unable to get his strength back since his stroke.  He does not exercise.  CBGs remain elevated most of the time.  He has significant daytime drowsiness according to the wife.  He usually falls asleep in the chair at night.  He complains of more memory loss.  He also has a history  of depression and is on Wellbutrin.  Appetite is good however they eat out a lot.  According to the wife he could sleep all the time.  He also has poor p.o. intake with water basically drinks sodas during the day.  He returns for reevaluation.  He just saw his cardiologist Dr.Croitoru in October.  No changes were made   UPDATE 04/24/2018CM Mark Stewart, 72 year old male returns for follow-up with history of right ischemic thalmic infarction in May 2017. He is currently on aspirin for secondary stroke prevention with no bruising and bleeding in addition he is on Lipitor without complaints of myalgias. He complains of a lot of fatigue however his diabetes is still not in control and his most recent hemoglobin A1c was 8. He has now been put on a sliding scale. Blood pressure in the office today 102/58. His wife says he doesn't drink enough. He has some recent reduction to his blood pressure medicine from cardiology note. He does little exercise he returns for reevaluation    UPDATE 10/23/2017CM  Mark Stewart, 72 year old male returns for follow-up. He has a history of acute right ischemic thalamic infarction which occurred in May 2017. He remains on aspirin for secondary stroke prevention without significant bruising or bleeding. He denies further stroke or TIA symptoms. Blood pressure is well controlled in the office today at 131/78. He remains on Coreg Catapres and valsartan. He exercises very little. He does not perform his home exercise program. Most recent hemoglobin A1c 8.5 on 06/20/2016. He is also Lipitor without complaints  of muscle aches. He returns for reevaluation  HISTORY 04/26/16 CM.Mark Stewart is an 72 y.o. male with a history of diabetes mellitus, hypertension and hyperlipidemia presenting with a complaint of dizziness and unstable gait for about 4 days. He was initially seen in the ED at [facility] on 02/27/16  and subsequently transferred to [facility] for further management. MRI of his  brain showed acute right ischemic thalamic infarction. He had no change in speech. Family has noted slight droop of left side of his face. He has not experienced weakness nor sensory changes involving extremities. He has no previous history of stroke nor TIA. He has not been taking aspirin consistently on a daily basis. NIH stroke score was 2. He was LKW 02/22/2016, time unclear. Patient was not administered IV t-PA secondary to delay in arrival. He was admitted for further evaluation and treatment.CTA head/neck - No significant large vessel finding. High grade stenosis R P4 2D Echo EF 55-60% LDL 71 hemoglobin A1c of 10.7. He returns for follow-up. He has not had further stroke or TIA symptoms. He is currently getting physical therapy for lower extremity weakness. He is not doing home exercise program at this point. He had recent changes to his insulin dose. He is keeping a log of his blood pressures. He has multiple questions. He returns for reevaluation   REVIEW OF SYSTEMS: Full 14 system review of systems performed and notable only for those listed, all others are neg:  Constitutional: Fatigue  Cardiovascular: neg Ear/Nose/Throat: Hearing loss Skin: neg Eyes: Blurred vision Respiratory: neg Gastroitestinal: neg  Hematology/Lymphatic: neg Endocrine: neg Musculoskeletal: neg Allergy/Immunology: neg Neurological: memory loss Psychiatric: Depression Sleep : Daytime sleepiness   ALLERGIES: Allergies  Allergen Reactions  . Benazepril Cough  . Penicillins Rash    Has patient had a PCN reaction causing immediate rash, facial/tongue/throat swelling, SOB or lightheadedness with hypotension: yes Has patient had a PCN reaction causing severe rash involving mucus membranes or skin necrosis: no Has patient had a PCN reaction that required hospitalization: no Has patient had a PCN reaction occurring within the last 10 years: no If all of the above answers are "NO", then may proceed with  Cephalosporin use.     HOME MEDICATIONS: Outpatient Medications Prior to Visit  Medication Sig Dispense Refill  . Amlodipine-Valsartan-HCTZ 10-320-25 MG TABS TAKE 1/2 TABLET BY MOUTH 2 TIMES DAILY 30 tablet 10  . aspirin EC 325 MG tablet Take 1 tablet (325 mg total) by mouth daily. 30 tablet 0  . atorvastatin (LIPITOR) 80 MG tablet Take 80 mg by mouth daily.    Marland Kitchen buPROPion (WELLBUTRIN XL) 150 MG 24 hr tablet Take 150 mg by mouth daily.    . carvedilol (COREG) 25 MG tablet TAKE 2 TABLETS (50 MG TOTAL) BY MOUTH 2 (TWO) TIMES DAILY. 120 tablet 7  . cloNIDine (CATAPRES - DOSED IN MG/24 HR) 0.2 mg/24hr patch Place 1 patch (0.2 mg total) onto the skin once a week. 4 patch 12  . empagliflozin (JARDIANCE) 25 MG TABS tablet Take 25 mg by mouth daily.     . fluticasone (FLONASE) 50 MCG/ACT nasal spray Place 2 sprays into both nostrils daily as needed for allergies or rhinitis. Reported on 04/26/2016    . insulin aspart (NOVOLOG) 100 UNIT/ML injection Inject 30 Units into the skin daily. Uses V-Go 30 units per day sq infustion     . spironolactone (ALDACTONE) 25 MG tablet Take 1 tablet (25 mg total) by mouth daily. 30 tablet 1  No facility-administered medications prior to visit.     PAST MEDICAL HISTORY: Past Medical History:  Diagnosis Date  . Allergy   . Cancer ([place])    skin, hand  . Cataract    history of surgery bilaterally  . Diabetes mellitus without complication ([place])   . Hypercholesteremia   . Hypertension   . 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 Stroke [facility]) 02/2016    PAST SURGICAL HISTORY: Past Surgical History:  Procedure Laterality Date  . EYE SURGERY    . HERNIA REPAIR  02/28/2018  . PROSTATE SURGERY    . SPLENECTOMY      FAMILY HISTORY: Family History  Problem Relation Age of Onset  . Breast cancer Mother   . Diabetes Mellitus II Father   . Breast cancer Sister     SOCIAL HISTORY: Social History   Socioeconomic History  .  Marital status: Married    Spouse name: graye  . Number of children: Not on file  . Years of education: Not on file  . Highest education level: Not on file  Occupational History  . Not on file  Social Needs  . Financial resource strain: Not on file  . Food insecurity:    Worry: Not on file    Inability: Not on file  . Transportation needs:    Medical: Not on file    Non-medical: Not on file  Tobacco Use  . Smoking status: Former Smoker    Packs/day: 1.00    Years: 20.00    Pack years: 20.00  . Smokeless tobacco: Never Used  . Tobacco comment: patient states last cigarette 35 years ago  Substance and Sexual Activity  . Alcohol use: No  . Drug use: No  . Sexual activity: Not on file  Lifestyle  . Physical activity:    Days per week: Not on file    Minutes per session: Not on file  . Stress: Not on file  Relationships  . Social connections:    Talks on phone: Not on file    Gets together: Not on file    Attends religious service: Not on file    Active member of club or organization: Not on file    Attends meetings of clubs or organizations: Not on file    Relationship status: Not on file  . Intimate partner violence:    Fear of current or ex partner: Not on file    Emotionally abused: Not on file    Physically abused: Not on file    Forced sexual activity: Not on file  Other Topics Concern  . Not on file  Social History Narrative  . Not on file     PHYSICAL EXAM  Vitals:   04/24/18 1501  BP: 135/79  Pulse: 66  Weight: 190 lb 3.2 oz (86.3 kg)  Height: 6' (1.829 m)   Body mass index is 25.8 kg/m.  Generalized: Well developed, in no acute distress  Head: normocephalic and atraumatic,. Oropharynx benign  Neck: Supple, no carotid bruits  Cardiac: Regular rate rhythm, no murmur  Musculoskeletal: No deformity   Neurological examination   Mentation: Alert oriented to time, place, history taking. MMSE not repeated last 27/30 missing calculation and 1 of 3  recall.    Follows all commands speech and language fluent.  Cranial nerve II-XII: Pupils were equal round reactive to light extraocular movements were full, visual field were full on confrontational test. Facial sensation and strength  were normal. hearing was intact to finger rubbing bilaterally. Uvula tongue midline. head turning and shoulder shrug were normal and symmetric.Tongue protrusion into cheek strength was normal. Motor: normal bulk and tone, full strength in the BUE, BLE, fine finger movements normal, no pronator drift. No focal weakness Sensory: normal and symmetric to light touch, pinprick, and  Vibration, in the upper and lower extremities Coordination: finger-nose-finger, heel-to-shin bilaterally, no dysmetria. No tremor Reflexes: 1+ upper lower and symmetric, plantar responses were flexor bilaterally. Gait and Station: Rising up from seated position without assistance, normal stance,  moderate stride, good arm swing, smooth turning, able to perform tiptoe, and heel walking without difficulty. Tandem gait is  unsteady. No assistive device  DIAGNOSTIC DATA (LABS, IMAGING, TESTING) - I reviewed patient records, labs, notes, testing and imaging myself where available.  Lab Results  Component Value Date   WBC 6.6 02/27/2016   HGB 14.2 02/27/2016   HCT 42.9 02/27/2016   MCV 91.9 02/27/2016   PLT 248 02/27/2016      Component Value Date/Time   NA 137 02/27/2016 0536   K 3.7 02/27/2016 0536   CL 103 02/27/2016 0536   CO2 28 02/27/2016 0536   GLUCOSE 251 (H) 02/27/2016 0536   BUN 8 02/27/2016 0536   CREATININE 0.71 02/27/2016 0536   CALCIUM 8.5 (L) 02/27/2016 0536   PROT 6.4 (L) 02/27/2016 0536   ALBUMIN 3.1 (L) 02/27/2016 0536   AST 14 (L) 02/27/2016 0536   ALT 16 (L) 02/27/2016 0536   ALKPHOS 72 02/27/2016 0536   BILITOT 0.8 02/27/2016 0536   GFRNONAA >60 02/27/2016 0536   GFRAA >60 02/27/2016 0536   Lab Results  Component Value Date   CHOL 121 02/27/2016   HDL 34  (L) 02/27/2016   LDLCALC 71 02/27/2016   TRIG 82 02/27/2016   CHOLHDL 3.6 02/27/2016   Lab Results  Component Value Date   HGBA1C 10.7 (H) 02/27/2016    ASSESSMENT AND PLAN  72 y.o. year old male  has a past medical history of  Diabetes mellitus without complication ([place]); Hypercholesteremia; Hypertension;  and Stroke ([place]) (02/2016). here For  follow-up. MRI of his brain showed acute right ischemic thalamic infarction.Marland KitchenCTA head/neck - No significant large vessel finding. High grade stenosis R P4 2D Echo EF 55-60% LDL 71 hemoglobin A1c of 8.5.  Patient continues to complain of significant fatigue, daytime drowsiness and memory loss.  The patient has recently been diagnosed with obstructive sleep apnea and has been on CPAP for approximately a month.  If the patient continues to have memory loss after he is compliant with his CPAP neuropsych testing may be beneficial as he also has depression.  The patient is a current patient of Dr. [name]  who is out of the office today . This note is sent to the work in doctor.      PLAN: Stressed the importance of management of risk factors to prevent further stroke Continue aspirin for secondary stroke prevention Maintain strict control of hypertension with blood pressure goal below 130/90, today's reading 135/79 continue antihypertensive medications and continue f/u with cardiology Stay well hydrated this is important to keep from being dehydrated Control of diabetes with hemoglobin A1c below7 followed by endocrinology    continue diabetic medications most recent hbgA1c 8.7 on 02/23/18 Cholesterol with LDL cholesterol less than 70, followed by primary care,  continue Lipitor Try to exercise by walking, recommend  30 minutes daily  Diabetic diet which is low-fat low-carb    Continue  Wellbutrin for depression ordered by primary care Continue follow-up with primary care for depression or may see psychiatrist Follow-up in 6  To 8 months Mark Stewart,  [address], [facility], [facility] [address] [place] [employer], [place] [address] [phone]

## 2018-04-24 ENCOUNTER — Telehealth: Payer: Self-pay | Admitting: Nurse Practitioner

## 2018-04-24 ENCOUNTER — Encounter: Payer: Self-pay | Admitting: Nurse Practitioner

## 2018-04-24 ENCOUNTER — Ambulatory Visit (INDEPENDENT_AMBULATORY_CARE_PROVIDER_SITE_OTHER): Payer: PPO | Admitting: Nurse Practitioner

## 2018-04-24 VITALS — BP 135/79 | HR 66 | Ht 72.0 in | Wt 190.2 lb

## 2018-04-24 DIAGNOSIS — E782 Mixed hyperlipidemia: Secondary | ICD-10-CM

## 2018-04-24 DIAGNOSIS — G4733 Obstructive sleep apnea (adult) (pediatric): Secondary | ICD-10-CM | POA: Diagnosis not present

## 2018-04-24 DIAGNOSIS — I6350 Cerebral infarction due to unspecified occlusion or stenosis of unspecified cerebral artery: Secondary | ICD-10-CM

## 2018-04-24 DIAGNOSIS — E1165 Type 2 diabetes mellitus with hyperglycemia: Secondary | ICD-10-CM

## 2018-04-24 NOTE — Patient Instructions (Signed)
Stressed the importance of management of risk factors to prevent further stroke Continue aspirin for secondary stroke prevention Maintain strict control of hypertension with blood pressure goal below 130/90, today's reading 135/79 continue antihypertensive medications and continue f/u with cardiology Stay well hydrated this is important to keep from being dehydrated Control of diabetes with hemoglobin A1c below7 followed by endocrinology    continue diabetic medications most recent hbgA1c 8.7 Cholesterol with LDL cholesterol less than 70, followed by primary care,  continue Lipitor Try to exercise by walking, recommend  30 minutes daily  Diabetic diet which is low-fat low-carb    Continue follow-up with primary care for depression or may see psychiatrist Follow-up in 6  To 8 months

## 2018-04-24 NOTE — Telephone Encounter (Signed)
Pt states she will call us back to schedule her 6-8 month f/u per Hassell Done, NP

## 2018-04-27 DIAGNOSIS — G4733 Obstructive sleep apnea (adult) (pediatric): Secondary | ICD-10-CM | POA: Diagnosis not present

## 2018-05-06 ENCOUNTER — Ambulatory Visit (INDEPENDENT_AMBULATORY_CARE_PROVIDER_SITE_OTHER): Payer: PPO | Admitting: Pharmacist

## 2018-05-06 VITALS — BP 128/64 | HR 64

## 2018-05-06 DIAGNOSIS — I10 Essential (primary) hypertension: Secondary | ICD-10-CM | POA: Diagnosis not present

## 2018-05-06 NOTE — Progress Notes (Signed)
HPI:  Mark Stewart is a 72 y.o. male patient of Dr Sallyanne Kuster, with a PMH below who presents today for hypertension clinic evaluation.  In addition to hypertension, his medical history is significant for CVA, hyperlipidemia and poorly controlled DM2.  He has a 20+ year history of hypertension, currently on maximum doses of 4/5 medications.  He also has some issues with memory and compliance, and in the past has admitted that he changes his clonidine patch every 8-10 days and has also forgotten to replace his V-Go pump on a daily basis.   Patient presents to clinic accompany by his wife. Denies problems with current therapy now that his wife is helping with his medication. Note patient did not complete BMET repeat since initiating spironolactone dose change. He denies dizziness, headaches, swelling, increased fatigue or chest pain.The weekly minders were filled correctly, and his bottles all within a reasonable date to suggest compliance.   Blood Pressure Goal:  130/80  Current Medications:  Amlodipine/valsartan/hctz 10/320/25 - 1/2 tab bid  Carvedilol 50 mg bid  Clonidine patch 0.2 mg   Spironolactone 25 mg qd  Family Hx:  Father with hypertension - died at 61 (diabetic, death due to "heart stopping")   Mother died from cancer at 27  2 sisters, 1 died from cancer; 1 healthy  2 children - no known heart disease   Social Hx:  No tobacco, quit 40 years ago; no alcohol; drinks diet coke throughout day (3 glasses at meals alone); occasional coffee  Diet:  Eat out ALL meals; no added salt, likes mayonaise; some veggies (corn, potatoes), occasional green beans; salad most meals with ranch dressing  Exercise:  None, 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   Intolerances:   Benazepril caused cough Labs:  01/06/18:  Na 139, K 3.6, Glu 261, SCr 0.94, BUN  18  Wt Readings from Last 3 Encounters:  04/24/18 190 lb 3.2 oz (86.3 kg)  12/05/17 192 lb (87.1 kg)  10/25/17 195 lb (88.5 kg)   BP Readings from Last 3 Encounters:  05/06/18 128/64  04/24/18 135/79  03/27/18 130/68   Pulse Readings from Last 3 Encounters:  05/06/18 64  04/24/18 66  03/27/18 72    Current Outpatient Medications  Medication Sig Dispense Refill  . Amlodipine-Valsartan-HCTZ 10-320-25 MG TABS TAKE 1/2 TABLET BY MOUTH 2 TIMES DAILY 30 tablet 10  . aspirin EC 325 MG tablet Take 1 tablet (325 mg total) by mouth daily. 30 tablet 0  . atorvastatin (LIPITOR) 80 MG tablet Take 80 mg by mouth daily.    Marland Kitchen buPROPion (WELLBUTRIN XL) 150 MG 24 hr tablet Take 150 mg by mouth daily.    . carvedilol (COREG) 25 MG tablet TAKE 2 TABLETS (50 MG TOTAL) BY MOUTH 2 (TWO) TIMES DAILY. 120 tablet 7  . cloNIDine (CATAPRES - DOSED IN MG/24 HR) 0.2 mg/24hr patch Place 1 patch (0.2 mg total) onto the skin once a week. 4 patch 12  . empagliflozin (JARDIANCE) 25 MG TABS tablet Take 25 mg by mouth daily.     . fluticasone (FLONASE) 50 MCG/ACT nasal spray Place 2 sprays into both nostrils daily as needed for allergies or rhinitis. Reported on 04/26/2016    . insulin aspart (NOVOLOG) 100 UNIT/ML injection Inject 30 Units into the skin daily. Uses V-Go 30  units per day sq infustion     . spironolactone (ALDACTONE) 25 MG tablet Take 1 tablet (25 mg total) by mouth daily. 30 tablet 1   No current facility-administered medications for this visit.     Allergies  Allergen Reactions  . Benazepril Cough  . Penicillins Rash    Has patient had a PCN reaction causing immediate rash, facial/tongue/throat swelling, SOB or lightheadedness with hypotension: yes Has patient had a PCN reaction causing severe rash involving mucus membranes or skin necrosis: no Has patient had a PCN reaction that required hospitalization: no Has patient had a PCN reaction occurring within the last 10 years: no If all of the  above answers are "NO", then may proceed with Cephalosporin use.     Past Medical History:  Diagnosis Date  . Allergy   . Cancer ([place])    skin, hand  . Cataract    history of surgery bilaterally  . Diabetes mellitus without complication ([place])   . Hypercholesteremia   . Hypertension   . 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 Stroke ([place]) 02/2016    Blood pressure 128/64, pulse 64.  Hypertension Blood pressure back at goal since wife helping with his medication and BP monitoring. Patient denies problems with current therapy. Will continue current regimen without changes and follow up as needed.   Loyce Klasen Rodriguez-Guzman PharmD, BCPS, [place] [address] [place] [address] 05/07/2018 1:31 PM

## 2018-05-06 NOTE — Patient Instructions (Addendum)
Return for a  follow up appointment AS NEEDED  Check your blood pressure at home daily (if able) and keep record of the readings.  Take your BP meds as follows: *NO MEDICATION CHANGES* *REPEAT BLOOD WORK TODAY*  Bring all of your meds, your BP cuff and your record of home blood pressures to your next appointment.  Exercise as you're able, try to walk approximately 30 minutes per day.  Keep salt intake to a minimum, especially watch canned and prepared boxed foods.  Eat more fresh fruits and vegetables and fewer canned items.  Avoid eating in fast food restaurants.    HOW TO TAKE YOUR BLOOD PRESSURE: . Rest 5 minutes before taking your blood pressure. .  Don't smoke or drink caffeinated beverages for at least 30 minutes before. . Take your blood pressure before (not after) you eat. . Sit comfortably with your back supported and both feet on the floor (don't cross your legs). . Elevate your arm to heart level on a table or a desk. . Use the proper sized cuff. It should fit smoothly and snugly around your bare upper arm. There should be enough room to slip a fingertip under the cuff. The bottom edge of the cuff should be 1 inch above the crease of the elbow. . Ideally, take 3 measurements at one sitting and record the average.

## 2018-05-07 ENCOUNTER — Encounter: Payer: Self-pay | Admitting: Pharmacist

## 2018-05-07 LAB — BASIC METABOLIC PANEL
BUN / CREAT RATIO: 25 — AB (ref 10–24)
BUN: 26 mg/dL (ref 8–27)
CHLORIDE: 100 mmol/L (ref 96–106)
CO2: 24 mmol/L (ref 20–29)
Calcium: 9.5 mg/dL (ref 8.6–10.2)
Creatinine, Ser: 1.05 mg/dL (ref 0.76–1.27)
GFR, EST AFRICAN AMERICAN: 82 mL/min/{1.73_m2} (ref >59)
GFR, EST NON AFRICAN AMERICAN: 71 mL/min/{1.73_m2} (ref >59)
Glucose: 111 mg/dL — ABNORMAL HIGH (ref 65–99)
POTASSIUM: 4 mmol/L (ref 3.5–5.2)
SODIUM: 140 mmol/L (ref 134–144)

## 2018-05-07 NOTE — Assessment & Plan Note (Signed)
Blood pressure back at goal since wife helping with his medication and BP monitoring. Patient denies problems with current therapy. Will continue current regimen without changes and follow up as needed.

## 2018-05-08 ENCOUNTER — Encounter: Payer: Self-pay | Admitting: *Deleted

## 2018-05-13 ENCOUNTER — Other Ambulatory Visit: Payer: Self-pay | Admitting: Cardiovascular Disease

## 2018-05-20 DIAGNOSIS — I679 Cerebrovascular disease, unspecified: Secondary | ICD-10-CM | POA: Diagnosis not present

## 2018-05-20 DIAGNOSIS — G3184 Mild cognitive impairment, so stated: Secondary | ICD-10-CM | POA: Diagnosis not present

## 2018-05-20 DIAGNOSIS — I639 Cerebral infarction, unspecified: Secondary | ICD-10-CM | POA: Diagnosis not present

## 2018-05-27 ENCOUNTER — Encounter: Payer: Self-pay | Admitting: Neurology

## 2018-05-28 DIAGNOSIS — G4733 Obstructive sleep apnea (adult) (pediatric): Secondary | ICD-10-CM | POA: Diagnosis not present

## 2018-05-29 ENCOUNTER — Ambulatory Visit (INDEPENDENT_AMBULATORY_CARE_PROVIDER_SITE_OTHER): Payer: PPO | Admitting: Neurology

## 2018-05-29 ENCOUNTER — Encounter: Payer: Self-pay | Admitting: Neurology

## 2018-05-29 VITALS — BP 193/83 | HR 70 | Ht 72.0 in | Wt 189.0 lb

## 2018-05-29 DIAGNOSIS — Z789 Other specified health status: Secondary | ICD-10-CM

## 2018-05-29 DIAGNOSIS — G4733 Obstructive sleep apnea (adult) (pediatric): Secondary | ICD-10-CM | POA: Diagnosis not present

## 2018-05-29 DIAGNOSIS — Z9989 Dependence on other enabling machines and devices: Secondary | ICD-10-CM | POA: Diagnosis not present

## 2018-05-29 NOTE — Patient Instructions (Addendum)
You have done a good job using your CPAP thus far. Your leak is too high consistently.   You can try Melatonin at night for sleep: take 1 mg to 3 mg, one to 2 hours before your bedtime. You can go up to 5 mg if needed. It is over the counter and comes in pill form, chewable form and spray, if you prefer.    Please continue using your CPAP regularly. While your insurance requires that you use CPAP at least 4 hours each night on 70% of the nights, I recommend, that you not skip any nights and use it throughout the night if you can. Getting used to CPAP and staying with the treatment long term does take time and patience and discipline. Untreated obstructive sleep apnea when it is moderate to severe can have an adverse impact on cardiovascular health and raise her risk for heart disease, arrhythmias, hypertension, congestive heart failure, stroke and diabetes. Untreated obstructive sleep apnea causes sleep disruption, nonrestorative sleep, and sleep deprivation. This can have an impact on your day to day functioning and cause daytime sleepiness and impairment of cognitive function, memory loss, mood disturbance, and problems focussing. Using CPAP regularly can improve these symptoms.  Please make an appointment with your DME company, [employer], for a mask refit and explain your mask and headgear issues, mouth dryness, air leak ets. Please have them explain how to change the humidifier setting and ramp time.

## 2018-05-29 NOTE — Progress Notes (Signed)
Order for mask refit sent to Mark Stewart.

## 2018-05-29 NOTE — Progress Notes (Signed)
fSubjective:    Patient ID: Mark Stewart is a 72 y.o. male.  HPI     Interim history:   Ms. Helget is a 72 year old right-handed gentleman with an underlying medical history of hypertension, hyperlipidemia, status post splenectomy as a teenager, stroke in 2017, diabetes, cataracts, allergies, and overweight state, who presents for follow-up consultation of his obstructive sleep apnea, after sleep study testing and starting CPAP therapy. The patient is accompanied by his wife again today. I first met him at the request of Dr. [name] and [name], NP, on 12/05/2017 at which time he reported snoring and daytime somnolence. His Epworth sleepiness score was 23 at the time. He was advised to proceed with sleep study testing. He had a baseline sleep study, followed by a CPAP titration study. I went over his test results with him in detail today. His baseline sleep study from 01/15/2018 showed a sleep efficiency of 74.1%, sleep latency 92 minutes, REM latency 87.5 minutes. He had an increased percentage of stage II sleep, REM sleep was mildly reduced at 16.9%, total AHI was 11.1 per hour, REM AHI 9.2 per hour, supine sleep was not achieved. Average oxygen saturation was 94%, nadir was 86%. Based on his medical history and sleep related complaints he was advised to pursue CPAP therapy. He had a titration study on 02/26/2018. Sleep efficiency was 80.2%, sleep latency 11 minutes, REM latency 67.5 minutes. He was titrated via full facemask from CPAP of 5 cm to 15 cm. On the final pressure his AHI was 0 per hour with supine REM sleep achieved an O2 nadir of 91%. He had mild PLMS with minimal arousals during that study. Based on his test results I prescribed CPAP therapy for home use at a pressure of 15 cm.  Today, 05/29/2018: I reviewed his CPAP compliance data from 04/28/2018 through 05/27/2018 which is a total of 30 days, during which time he used his CPAP every night with percent used days greater than 4  hours at 70%, indicating adequate compliance with an average usage of 5 hours and 21 minutes, residual AHI at goal at 2.1 per hour, leak on the high side with the 95th percentile at 38.8 L/m on a pressure of 15 cm with EPR of 3. He reports struggling with his CPAP every night. He is compliant with using it but is not reaping much in the way of benefit. He does not sleep any better, daytime somnolence is not yet better her per wife's observation. He has a tendency to go to bed late. Since his stroke he has had difficulty falling asleep and has delayed his bedtime sometimes as late as 2 AM. He does doze off multiple times in the evening while sitting on the couch. He was seen by neuropsychology in [facility] recently and has a follow-up appointment. He has had no new neurological symptoms or no new medications. Some of his medications could potentially be sedating such as the clonidine.  The patient's allergies, current medications, family history, past medical history, past social history, past surgical history and problem list were reviewed and updated as appropriate.   Previously (copied from previous notes for reference):   12/05/2017: (He) reports snoring and excessive daytime somnolence. I reviewed your office note from 10/25/2017. His Epworth sleepiness score is 23 out of 24 today, fatigue score is 41 out of 63. He quit smoking, he does not utilize alcohol currently, drinks caffeine in the form of coffee, typically 1 cup per day and 3 cups  of tea per day on average. He does not have that scheduled for his bedtime and rise time. Per wife, he tends to sit in the recliner in the dependent watch TV but he dozes off frequently. He may not come to bed until 1 or 2 AM and sometimes as late as 3 AM, her patient his rise time is around 7 but per wife, he may be in bed until 10 unless she makes him get out of bed. He does not have a formal family history of sleep apnea but suspects that his father could've had it.  He had a tonsillectomy as a child. He quit smoking over 30 years ago. He has 20 more is and 4 grandchildren, is retired as an Optometrist. His wife also recently retired. His weight has been stable. He has nocturia about once or twice per night, denies morning headaches. He denies telltale symptoms of restless leg syndrome. Sometimes he mumbles in his sleep and says a few words per wife's report.  His Past Medical History Is Significant For: Past Medical History:  Diagnosis Date  . Allergy   . Cancer ([place])    skin, hand  . Cataract    history of surgery bilaterally  . Diabetes mellitus without complication ([place])   . Hypercholesteremia   . Hypertension   . 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 Stroke [facility]) 02/2016    His Past Surgical History Is Significant For: Past Surgical History:  Procedure Laterality Date  . EYE SURGERY    . HERNIA REPAIR  02/28/2018  . PROSTATE SURGERY    . SPLENECTOMY      His Family History Is Significant For: Family History  Problem Relation Age of Onset  . Breast cancer Mother   . Diabetes Mellitus II Father   . Breast cancer Sister     His Social History Is Significant For: Social History   Socioeconomic History  . Marital status: Married    Spouse name: graye  . Number of children: Not on file  . Years of education: Not on file  . Highest education level: Not on file  Occupational History  . Not on file  Social Needs  . Financial resource strain: Not on file  . Food insecurity:    Worry: Not on file    Inability: Not on file  . Transportation needs:    Medical: Not on file    Non-medical: Not on file  Tobacco Use  . Smoking status: Former Smoker    Packs/day: 1.00    Years: 20.00    Pack years: 20.00  . Smokeless tobacco: Never Used  . Tobacco comment: patient states last cigarette 35 years ago  Substance and Sexual Activity  . Alcohol use: No  . Drug use: No  . Sexual activity: Not on file   Lifestyle  . Physical activity:    Days per week: Not on file    Minutes per session: Not on file  . Stress: Not on file  Relationships  . Social connections:    Talks on phone: Not on file    Gets together: Not on file    Attends religious service: Not on file    Active member of club or organization: Not on file    Attends meetings of clubs or organizations: Not on file    Relationship status: Not on file  Other Topics Concern  . Not on file  Social History Narrative  .  Not on file    His Allergies Are:  Allergies  Allergen Reactions  . Benazepril Cough  . Penicillins Rash    Has patient had a PCN reaction causing immediate rash, facial/tongue/throat swelling, SOB or lightheadedness with hypotension: yes Has patient had a PCN reaction causing severe rash involving mucus membranes or skin necrosis: no Has patient had a PCN reaction that required hospitalization: no Has patient had a PCN reaction occurring within the last 10 years: no If all of the above answers are "NO", then may proceed with Cephalosporin use.   :   His Current Medications Are:  Outpatient Encounter Medications as of 05/29/2018  Medication Sig  . Amlodipine-Valsartan-HCTZ 10-320-25 MG TABS TAKE 1/2 TABLET BY MOUTH 2 TIMES DAILY  . aspirin EC 325 MG tablet Take 1 tablet (325 mg total) by mouth daily.  Marland Kitchen atorvastatin (LIPITOR) 80 MG tablet Take 80 mg by mouth daily.  Marland Kitchen buPROPion (WELLBUTRIN XL) 150 MG 24 hr tablet Take 150 mg by mouth daily.  . carvedilol (COREG) 25 MG tablet TAKE 2 TABLETS (50 MG TOTAL) BY MOUTH 2 (TWO) TIMES DAILY.  . cloNIDine (CATAPRES - DOSED IN MG/24 HR) 0.2 mg/24hr patch Place 1 patch (0.2 mg total) onto the skin once a week.  . empagliflozin (JARDIANCE) 25 MG TABS tablet Take 25 mg by mouth daily.   . fluticasone (FLONASE) 50 MCG/ACT nasal spray Place 2 sprays into both nostrils daily as needed for allergies or rhinitis. Reported on 04/26/2016  . insulin aspart (NOVOLOG) 100 UNIT/ML  injection Inject 30 Units into the skin daily. Uses V-Go 30 units per day sq infustion   . spironolactone (ALDACTONE) 25 MG tablet TAKE 1 TABLET BY MOUTH EVERY DAY   No facility-administered encounter medications on file as of 05/29/2018.   :  Review of Systems:  Out of a complete 14 point review of systems, all are reviewed and negative with the exception of these symptoms as listed below: Review of Systems  Neurological:       Pt presents today to discuss his cpap. Pt does not feel better on PAP therapy.    Objective:  Neurological Exam  Physical Exam Physical Examination:   Vitals:   05/29/18 0916  BP: (!) 193/83  Pulse: 70   General Examination: The patient is a very pleasant 72 y.o. male in no acute distress. He appears well-developed and well-nourished and well groomed.   HEENT: Normocephalic, atraumatic, pupils are equal, round and reactive to light and accommodation. He wears corrective eyeglasses. Face is symmetric, no dysarthria, no facial weakness. No nuchal rigidity. Airway examination reveals adequate dental hygiene, moderate airway crowding secondary to floppy soft palate and redundant soft palate, wider uvula, tongue is normal and protrudes centrally, palate elevates symmetrically. Mallampati is class II, tonsils are absent.  Chest: Clear to auscultation without wheezing, rhonchi or crackles noted.  Heart: S1+S2+0, regular and normal without murmurs, rubs or gallops noted.   Abdomen: Soft, non-tender and non-distended with normal bowel sounds appreciated on auscultation.  Extremities: There is trace pitting edema in the left ankle.   Skin: Warm and dry without trophic changes noted.  Musculoskeletal: exam reveals no obvious joint deformities, tenderness or joint swelling or erythema.   Neurologically:  Mental status: The patient is awake, alert and oriented in all 4 spheres. His immediate and remote memory, attention, language skills and fund of knowledge  are appropriate. There is no evidence of aphasia, agnosia, apraxia or anomia. Speech is clear with normal prosody and  enunciation. Thought process is linear. Mood is normal and affect is normal.  Cranial nerves II - XII are as described above under HEENT exam.  Motor exam: Normal bulk, strength and tone is noted. There is no drift, or tremor. Fine motor skills and coordination: grossly intact.  Cerebellar testing: No dysmetria or intention tremor. There is no truncal or gait ataxia.  Sensory exam: intact to light touch in the upper and lower extremities.  Gait, station and balance: He stands easily. No veering to one side is noted. No leaning to one side is noted. Posture is age-appropriate and stance is narrow based. Gait shows normal stride length and normal pace. No problems turning are noted.   Assessment and Plan:  In summary, Mark Stewart is a 72 year-old male with an underlying medical history of hypertension, hyperlipidemia, status post splenectomy as a teenager, stroke in 2017, diabetes, cataracts, allergies, and overweight state, who presents for sleep consultation of his obstructive sleep apnea. He has been on CPAP therapy. He is compliant with treatment. He still struggles with it and does not have telltale improvement of his sleep-related symptoms. He still has daytime tiredness. He is also struggling with tolerance of the mask. He is unfortunately also not very regular with his bedtime routine. He is advised to try to continue with treatment and also adhered to him more regular bedtime and rise time routine. In addition, he can try melatonin at night. Furthermore, he may benefit from a mask refit. He is encouraged to talk to his DME company about this. He is commended for his treatment adherence and encouraged to continue. He's willing to make some of these changes. I suggested a 6 month follow-up, sooner if needed. I answered all their questions today and the patient and his wife were in  agreement. I spent 25 minutes in total face-to-face time with the patient, more than 50% of which was spent in counseling and coordination of care, reviewing test results, reviewing medication and discussing or reviewing the diagnosis of OSA, its prognosis and treatment options. Pertinent laboratory and imaging test results that were available during this visit with the patient were reviewed by me and considered in my medical decision making (see chart for details).

## 2018-06-02 DIAGNOSIS — F0670 Mild neurocognitive disorder due to known physiological condition without behavioral disturbance: Secondary | ICD-10-CM | POA: Insufficient documentation

## 2018-06-03 ENCOUNTER — Other Ambulatory Visit: Payer: Self-pay | Admitting: Cardiovascular Disease

## 2018-06-03 NOTE — Telephone Encounter (Signed)
Rx sent to pharmacy   

## 2018-06-04 DIAGNOSIS — I639 Cerebral infarction, unspecified: Secondary | ICD-10-CM | POA: Diagnosis not present

## 2018-06-04 DIAGNOSIS — I69318 Other symptoms and signs involving cognitive functions following cerebral infarction: Secondary | ICD-10-CM | POA: Diagnosis not present

## 2018-07-09 ENCOUNTER — Other Ambulatory Visit: Payer: Self-pay | Admitting: Cardiovascular Disease

## 2018-07-29 DIAGNOSIS — L570 Actinic keratosis: Secondary | ICD-10-CM | POA: Diagnosis not present

## 2018-07-29 DIAGNOSIS — C44622 Squamous cell carcinoma of skin of right upper limb, including shoulder: Secondary | ICD-10-CM | POA: Diagnosis not present

## 2018-07-29 DIAGNOSIS — G4733 Obstructive sleep apnea (adult) (pediatric): Secondary | ICD-10-CM | POA: Diagnosis not present

## 2018-07-29 DIAGNOSIS — C44629 Squamous cell carcinoma of skin of left upper limb, including shoulder: Secondary | ICD-10-CM | POA: Diagnosis not present

## 2018-08-08 ENCOUNTER — Other Ambulatory Visit: Payer: Self-pay | Admitting: Cardiovascular Disease

## 2018-08-12 DIAGNOSIS — E113293 Type 2 diabetes mellitus with mild nonproliferative diabetic retinopathy without macular edema, bilateral: Secondary | ICD-10-CM | POA: Diagnosis not present

## 2018-08-20 DIAGNOSIS — I10 Essential (primary) hypertension: Secondary | ICD-10-CM | POA: Diagnosis not present

## 2018-08-20 DIAGNOSIS — E7849 Other hyperlipidemia: Secondary | ICD-10-CM | POA: Diagnosis not present

## 2018-08-20 DIAGNOSIS — F331 Major depressive disorder, recurrent, moderate: Secondary | ICD-10-CM | POA: Diagnosis not present

## 2018-08-20 DIAGNOSIS — Z23 Encounter for immunization: Secondary | ICD-10-CM | POA: Diagnosis not present

## 2018-08-22 DIAGNOSIS — E118 Type 2 diabetes mellitus with unspecified complications: Secondary | ICD-10-CM | POA: Diagnosis not present

## 2018-08-22 DIAGNOSIS — Z794 Long term (current) use of insulin: Secondary | ICD-10-CM | POA: Diagnosis not present

## 2018-08-22 DIAGNOSIS — E7849 Other hyperlipidemia: Secondary | ICD-10-CM | POA: Diagnosis not present

## 2018-08-24 ENCOUNTER — Other Ambulatory Visit: Payer: Self-pay | Admitting: Cardiovascular Disease

## 2018-08-29 DIAGNOSIS — G4733 Obstructive sleep apnea (adult) (pediatric): Secondary | ICD-10-CM | POA: Diagnosis not present

## 2018-08-30 ENCOUNTER — Other Ambulatory Visit: Payer: Self-pay | Admitting: Cardiovascular Disease

## 2018-09-09 ENCOUNTER — Other Ambulatory Visit: Payer: Self-pay

## 2018-09-09 MED ORDER — SPIRONOLACTONE 25 MG PO TABS: 25.0000 mg | ORAL_TABLET | Freq: Every day | ORAL | 0 refills | Status: DC

## 2018-09-16 DIAGNOSIS — E118 Type 2 diabetes mellitus with unspecified complications: Secondary | ICD-10-CM | POA: Diagnosis not present

## 2018-09-16 DIAGNOSIS — Z794 Long term (current) use of insulin: Secondary | ICD-10-CM | POA: Diagnosis not present

## 2018-09-24 ENCOUNTER — Other Ambulatory Visit: Payer: Self-pay | Admitting: Cardiovascular Disease

## 2018-09-25 ENCOUNTER — Other Ambulatory Visit: Payer: Self-pay | Admitting: Cardiovascular Disease

## 2018-09-28 DIAGNOSIS — G4733 Obstructive sleep apnea (adult) (pediatric): Secondary | ICD-10-CM | POA: Diagnosis not present

## 2018-09-29 DIAGNOSIS — Z794 Long term (current) use of insulin: Secondary | ICD-10-CM | POA: Diagnosis not present

## 2018-09-29 DIAGNOSIS — E118 Type 2 diabetes mellitus with unspecified complications: Secondary | ICD-10-CM | POA: Diagnosis not present

## 2018-10-11 ENCOUNTER — Other Ambulatory Visit: Payer: Self-pay | Admitting: Cardiovascular Disease

## 2018-10-14 NOTE — Telephone Encounter (Signed)
Appt made with Croitoru on Friday; patient voiced understanding.

## 2018-10-17 ENCOUNTER — Ambulatory Visit (INDEPENDENT_AMBULATORY_CARE_PROVIDER_SITE_OTHER): Payer: PPO | Admitting: Cardiovascular Disease

## 2018-10-17 ENCOUNTER — Encounter: Payer: Self-pay | Admitting: Cardiovascular Disease

## 2018-10-17 VITALS — BP 112/72 | HR 63 | Ht 72.0 in | Wt 192.0 lb

## 2018-10-17 DIAGNOSIS — E1151 Type 2 diabetes mellitus with diabetic peripheral angiopathy without gangrene: Secondary | ICD-10-CM

## 2018-10-17 DIAGNOSIS — I10 Essential (primary) hypertension: Secondary | ICD-10-CM

## 2018-10-17 DIAGNOSIS — E1165 Type 2 diabetes mellitus with hyperglycemia: Secondary | ICD-10-CM

## 2018-10-17 DIAGNOSIS — E7800 Pure hypercholesterolemia, unspecified: Secondary | ICD-10-CM | POA: Diagnosis not present

## 2018-10-17 DIAGNOSIS — I69354 Hemiplegia and hemiparesis following cerebral infarction affecting left non-dominant side: Secondary | ICD-10-CM | POA: Diagnosis not present

## 2018-10-17 DIAGNOSIS — IMO0002 Reserved for concepts with insufficient information to code with codable children: Secondary | ICD-10-CM

## 2018-10-17 NOTE — Progress Notes (Signed)
Cardiology Office Note:    Date:  10/18/2018   ID:  HALLIS MEDITZ, DOB 1945/12/16, MRN [id number]  PCP:  Algis Greenhouse, MD  Cardiologist:  Sanda Klein, MD    Referring MD: Algis Greenhouse, MD   Chief Complaint  Patient presents with  . Hypertension    History of stroke  . Hyperlipidemia    History of Present Illness:    Mark Stewart is a 73 y.o. male with a hx of Long-standing hypertension (over 20 years), insulin requiring type 2 diabetes mellitus, hyperlipidemia, right thalamic ischemic stroke in May 2017.  He has currently done well and has no cardiovascular complaints. The patient specifically denies any chest pain at rest exertion, dyspnea at rest or with exertion, orthopnea, paroxysmal nocturnal dyspnea, syncope, palpitations, focal neurological deficits, intermittent claudication, lower extremity edema, unexplained weight gain, cough, hemoptysis or wheezing.  Occasionally has ankle swelling that resolves overnight.  His wife continues to describe him has been apathetic and reluctant to do either physical or mental tasks.  He continues to sleep a lot.  He been monitoring his blood pressure at home for the most part readings are satisfactory, gets occasional elevated systolic blood pressure even up to 180.  Whenever he comes to our clinic his blood pressure has been well controlled including today at 112/72.  We compared his home blood pressure monitor with our office monitor.  His home monitor appears to be reasonably accurate although it overestimated his blood pressure by about 7 mmHg.  Glycemic control has been poor.  He is now on Jardiance and higher doses of insulin.  His most recent hemoglobin A1c on November 15 was 12%.  His LDL cholesterol has increased a little to 111, although his HDL is also better at 51 and his triglycerides are normal.  He is on the maximum dose of atorvastatin.   Past Medical History:  Diagnosis Date  . Allergy   . Cancer ([place])    skin,  hand  . Cataract    history of surgery bilaterally  . Diabetes mellitus without complication ([place])   . Hypercholesteremia   . Hypertension   . 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 Stroke [facility]) 02/2016    Past Surgical History:  Procedure Laterality Date  . EYE SURGERY    . HERNIA REPAIR  02/28/2018  . PROSTATE SURGERY    . SPLENECTOMY      Current Medications: Current Meds  Medication Sig  . aspirin EC 325 MG tablet Take 1 tablet (325 mg total) by mouth daily.  Marland Kitchen atorvastatin (LIPITOR) 80 MG tablet Take 80 mg by mouth daily.  Marland Kitchen buPROPion (WELLBUTRIN XL) 150 MG 24 hr tablet Take 150 mg by mouth daily.  . carvedilol (COREG) 25 MG tablet Take 2 tablets (50 mg total) by mouth 2 (two) times daily. Call for appointment  . cloNIDine (CATAPRES - DOSED IN MG/24 HR) 0.2 mg/24hr patch PLACE 1 PATCH (0.2 MG TOTAL) ONTO THE SKIN ONCE A WEEK.  . empagliflozin (JARDIANCE) 25 MG TABS tablet Take 25 mg by mouth daily.   . fluticasone (FLONASE) 50 MCG/ACT nasal spray Place 2 sprays into both nostrils daily as needed for allergies or rhinitis. Reported on 04/26/2016  . insulin aspart (NOVOLOG) 100 UNIT/ML injection Inject 40 Units into the skin daily. Uses V-Go 30 units per day sq infustion   . spironolactone (ALDACTONE) 25 MG tablet Take 1 tablet (25 mg total) by mouth  daily. Please schedule yearly appt for more refills, thanks! 1st attmpt     Allergies:   Benazepril and Penicillins   Social History   Socioeconomic History  . Marital status: Married    Spouse name: graye  . Number of children: Not on file  . Years of education: Not on file  . Highest education level: Not on file  Occupational History  . Not on file  Social Needs  . Financial resource strain: Not on file  . Food insecurity:    Worry: Not on file    Inability: Not on file  . Transportation needs:    Medical: Not on file    Non-medical: Not on file  Tobacco Use  . Smoking status: Former  Smoker    Packs/day: 1.00    Years: 20.00    Pack years: 20.00  . Smokeless tobacco: Never Used  . Tobacco comment: patient states last cigarette 35 years ago  Substance and Sexual Activity  . Alcohol use: No  . Drug use: No  . Sexual activity: Not on file  Lifestyle  . Physical activity:    Days per week: Not on file    Minutes per session: Not on file  . Stress: Not on file  Relationships  . Social connections:    Talks on phone: Not on file    Gets together: Not on file    Attends religious service: Not on file    Active member of club or organization: Not on file    Attends meetings of clubs or organizations: Not on file    Relationship status: Not on file  Other Topics Concern  . Not on file  Social History Narrative  . Not on file     Family History: The patient's family history includes Breast cancer in his mother and sister; Diabetes Mellitus II in his father. ROS:   Please see the history of present illness.     All other systems reviewed and are negative.  EKGs/Labs/Other Studies Reviewed:    MRI from 02/26/2016 shows an acute/subacute ischemic infarct involving the right thalamus, also mild chronic small vessel ischemic disease Echo from 02/27/2016 shows a mildly dilated left ventricle (LVEDD 55 mm may actually be normal), normal EF 55-60 percent, abnormal relaxation, left atrium 39 mm  EKG:  EKG is ordered today.  It shows normal sinus rhythm and is a normal tracing.  Recent Labs: 05/06/2018: BUN 26; Creatinine, Ser 1.05; Potassium 4.0; Sodium 140  Recent Lipid Panel    Component Value Date/Time   CHOL 121 02/27/2016 0536   TRIG 82 02/27/2016 0536   HDL 34 (L) 02/27/2016 0536   CHOLHDL 3.6 02/27/2016 0536   VLDL 16 02/27/2016 0536   LDLCALC 71 02/27/2016 0536   August 20 2018 lipid profile Total cholesterol 158, HDL 51, LDL 111, triglycerides 106 Hemoglobin A1c 12%  Physical Exam:    VS:  BP 112/72   Pulse 63   Ht 6' (1.829 m)   Wt 192 lb  (87.1 kg)   BMI 26.04 kg/m     Wt Readings from Last 3 Encounters:  10/17/18 192 lb (87.1 kg)  05/29/18 189 lb (85.7 kg)  04/24/18 190 lb 3.2 oz (86.3 kg)     General: Alert, oriented x3, no distress, minimally overweight Head: no evidence of trauma, PERRL, EOMI, no exophtalmos or lid lag, no myxedema, no xanthelasma; normal ears, nose and oropharynx Neck: normal jugular venous pulsations and no hepatojugular reflux; brisk carotid pulses without delay  and no carotid bruits Chest: clear to auscultation, no signs of consolidation by percussion or palpation, normal fremitus, symmetrical and full respiratory excursions Cardiovascular: normal position and quality of the apical impulse, regular rhythm, normal first and second heart sounds, no murmurs, rubs or gallops Abdomen: no tenderness or distention, no masses by palpation, no abnormal pulsatility or arterial bruits, normal bowel sounds, no hepatosplenomegaly Extremities: no clubbing, cyanosis or edema; 2+ radial, ulnar and brachial pulses bilaterally; 2+ right femoral, posterior tibial and dorsalis pedis pulses; 2+ left femoral, posterior tibial and dorsalis pedis pulses; no subclavian or femoral bruits Neurological: grossly nonfocal other than mild left upper extremity hemiparesis Psych: Normal mood and affect   ASSESSMENT:    1. Essential hypertension   2. Hemiparesis affecting left side as late effect of stroke ([place])   3. Hypercholesterolemia   4. DM (diabetes mellitus), type 2, uncontrolled, periph vascular complic ([place])    PLAN:    In order of problems listed above:  1. HTN: Blood pressure control is good.  His home blood pressure monitor overestimates his blood pressure about about 7 mmHg.  Even so, he has occasional episodes of elevated systolic blood pressure at home but I do not think we need to change his medications. 2. L hemiparesis: The motor residual effects of a stroke are very mild and do not interfere with activity,  but he seems to have a fairly significant deterioration in executive skills and has apathy.  He has a follow-up visit with his neurologist next month.  I do not think he needs to take the full dose of aspirin 325 mg daily and would recommend reducing the dose to 81 mg daily.  He should discuss this with his neurologist at their upcoming appointment next month. 3. HLP: Continue high-dose statin.  His LDL cholesterol has deteriorated, but I think the solution is better glycemic control rather than adding more lipid-lowering medication. 4. DM: Poorly controlled.  Insulin dose recently adjusted.  I am very pleased that he is taking SGLT2 inhibitor.  Consider adding GLP-1 agonist such as Victoza.   Medication Adjustments/Labs and Tests Ordered: Current medicines are reviewed at length with the patient today.  Concerns regarding medicines are outlined above.  No orders of the defined types were placed in this encounter.  No orders of the defined types were placed in this encounter.  Patient Instructions  Medication Instructions:  Dr Sallyanne Kuster recommends that you continue on your current medications as directed. Please refer to the Current Medication list given to you today.  If you need a refill on your cardiac medications before your next appointment, please call your pharmacy.   Follow-Up: At [facility], you and your health needs are our priority.  As part of our continuing mission to provide you with exceptional heart care, we have created designated Provider Care Teams.  These Care Teams include your primary Cardiologist (physician) and Advanced Practice Providers (APPs -  Physician Assistants and Nurse Practitioners) who all work together to provide you with the care you need, when you need it. You will need a follow up appointment in 12 months.  Please call our office 2 months in advance to schedule this appointment.  You may see Sanda Klein, MD or one of the following Advanced Practice  Providers on your designated Care Team: [place], [place] . Fabian Sharp, PA-C . You will receive a reminder letter in the mail two months in advance. If you don't receive a letter, please call our office to  schedule the follow-up appointment.    Signed, Sanda Klein, MD  10/18/2018 11:15 AM    [place]

## 2018-10-17 NOTE — Patient Instructions (Signed)
Medication Instructions:  Dr Croitoru recommends that you continue on your current medications as directed. Please refer to the Current Medication list given to you today.  If you need a refill on your cardiac medications before your next appointment, please call your pharmacy.   Follow-Up: At [facility], you and your health needs are our priority.  As part of our continuing mission to provide you with exceptional heart care, we have created designated Provider Care Teams.  These Care Teams include your primary Cardiologist (physician) and Advanced Practice Providers (APPs -  Physician Assistants and Nurse Practitioners) who all work together to provide you with the care you need, when you need it. You will need a follow up appointment in 12 months.  Please call our office 2 months in advance to schedule this appointment.  You may see Mihai Croitoru, MD or one of the following Advanced Practice Providers on your designated Care Team: Hao Meng, PA-C . Angela Duke, PA-C . You will receive a reminder letter in the mail two months in advance. If you don't receive a letter, please call our office to schedule the follow-up appointment. 

## 2018-10-18 ENCOUNTER — Encounter: Payer: Self-pay | Admitting: Cardiovascular Disease

## 2018-10-18 DIAGNOSIS — I69354 Hemiplegia and hemiparesis following cerebral infarction affecting left non-dominant side: Secondary | ICD-10-CM | POA: Insufficient documentation

## 2018-10-30 ENCOUNTER — Other Ambulatory Visit: Payer: Self-pay | Admitting: Cardiovascular Disease

## 2018-11-05 DIAGNOSIS — R296 Repeated falls: Secondary | ICD-10-CM | POA: Diagnosis not present

## 2018-11-05 DIAGNOSIS — M6281 Muscle weakness (generalized): Secondary | ICD-10-CM | POA: Diagnosis not present

## 2018-11-10 DIAGNOSIS — M6281 Muscle weakness (generalized): Secondary | ICD-10-CM | POA: Diagnosis not present

## 2018-11-10 DIAGNOSIS — R296 Repeated falls: Secondary | ICD-10-CM | POA: Diagnosis not present

## 2018-11-12 DIAGNOSIS — M6281 Muscle weakness (generalized): Secondary | ICD-10-CM | POA: Diagnosis not present

## 2018-11-12 DIAGNOSIS — R296 Repeated falls: Secondary | ICD-10-CM | POA: Diagnosis not present

## 2018-11-17 DIAGNOSIS — R296 Repeated falls: Secondary | ICD-10-CM | POA: Diagnosis not present

## 2018-11-17 DIAGNOSIS — M6281 Muscle weakness (generalized): Secondary | ICD-10-CM | POA: Diagnosis not present

## 2018-11-19 DIAGNOSIS — M6281 Muscle weakness (generalized): Secondary | ICD-10-CM | POA: Diagnosis not present

## 2018-11-19 DIAGNOSIS — R296 Repeated falls: Secondary | ICD-10-CM | POA: Diagnosis not present

## 2018-11-27 DIAGNOSIS — Z794 Long term (current) use of insulin: Secondary | ICD-10-CM | POA: Diagnosis not present

## 2018-11-27 DIAGNOSIS — E118 Type 2 diabetes mellitus with unspecified complications: Secondary | ICD-10-CM | POA: Diagnosis not present

## 2018-11-27 DIAGNOSIS — E7849 Other hyperlipidemia: Secondary | ICD-10-CM | POA: Diagnosis not present

## 2018-11-28 ENCOUNTER — Other Ambulatory Visit: Payer: Self-pay | Admitting: Cardiovascular Disease

## 2018-12-02 DIAGNOSIS — Z794 Long term (current) use of insulin: Secondary | ICD-10-CM | POA: Diagnosis not present

## 2018-12-02 DIAGNOSIS — E118 Type 2 diabetes mellitus with unspecified complications: Secondary | ICD-10-CM | POA: Diagnosis not present

## 2018-12-03 ENCOUNTER — Ambulatory Visit: Payer: PPO | Admitting: Neurology

## 2018-12-05 DIAGNOSIS — E118 Type 2 diabetes mellitus with unspecified complications: Secondary | ICD-10-CM | POA: Diagnosis not present

## 2018-12-05 DIAGNOSIS — Z794 Long term (current) use of insulin: Secondary | ICD-10-CM | POA: Diagnosis not present

## 2018-12-31 DIAGNOSIS — E118 Type 2 diabetes mellitus with unspecified complications: Secondary | ICD-10-CM | POA: Diagnosis not present

## 2019-01-06 ENCOUNTER — Other Ambulatory Visit: Payer: Self-pay | Admitting: Cardiovascular Disease

## 2019-02-09 DIAGNOSIS — Z794 Long term (current) use of insulin: Secondary | ICD-10-CM | POA: Diagnosis not present

## 2019-02-09 DIAGNOSIS — E785 Hyperlipidemia, unspecified: Secondary | ICD-10-CM | POA: Diagnosis not present

## 2019-02-09 DIAGNOSIS — E118 Type 2 diabetes mellitus with unspecified complications: Secondary | ICD-10-CM | POA: Diagnosis not present

## 2019-02-09 DIAGNOSIS — E119 Type 2 diabetes mellitus without complications: Secondary | ICD-10-CM | POA: Diagnosis not present

## 2019-02-09 DIAGNOSIS — L209 Atopic dermatitis, unspecified: Secondary | ICD-10-CM | POA: Diagnosis not present

## 2019-02-16 DIAGNOSIS — L308 Other specified dermatitis: Secondary | ICD-10-CM | POA: Diagnosis not present

## 2019-02-16 DIAGNOSIS — L300 Nummular dermatitis: Secondary | ICD-10-CM | POA: Diagnosis not present

## 2019-02-16 DIAGNOSIS — D485 Neoplasm of uncertain behavior of skin: Secondary | ICD-10-CM | POA: Diagnosis not present

## 2019-02-16 DIAGNOSIS — L853 Xerosis cutis: Secondary | ICD-10-CM | POA: Diagnosis not present

## 2019-02-18 ENCOUNTER — Other Ambulatory Visit: Payer: Self-pay

## 2019-02-18 DIAGNOSIS — E118 Type 2 diabetes mellitus with unspecified complications: Secondary | ICD-10-CM | POA: Diagnosis not present

## 2019-02-18 MED ORDER — CARVEDILOL 25 MG PO TABS: 50.0000 mg | ORAL_TABLET | Freq: Two times a day (BID) | ORAL | 1 refills | Status: DC

## 2019-02-19 ENCOUNTER — Other Ambulatory Visit: Payer: Self-pay | Admitting: Cardiovascular Disease

## 2019-03-23 DIAGNOSIS — E118 Type 2 diabetes mellitus with unspecified complications: Secondary | ICD-10-CM | POA: Diagnosis not present

## 2019-03-30 DIAGNOSIS — E118 Type 2 diabetes mellitus with unspecified complications: Secondary | ICD-10-CM | POA: Diagnosis not present

## 2019-03-30 DIAGNOSIS — Z794 Long term (current) use of insulin: Secondary | ICD-10-CM | POA: Diagnosis not present

## 2019-04-22 DIAGNOSIS — E118 Type 2 diabetes mellitus with unspecified complications: Secondary | ICD-10-CM | POA: Diagnosis not present

## 2019-05-08 DIAGNOSIS — E113293 Type 2 diabetes mellitus with mild nonproliferative diabetic retinopathy without macular edema, bilateral: Secondary | ICD-10-CM | POA: Diagnosis not present

## 2019-05-12 DIAGNOSIS — Z794 Long term (current) use of insulin: Secondary | ICD-10-CM | POA: Diagnosis not present

## 2019-05-12 DIAGNOSIS — E7849 Other hyperlipidemia: Secondary | ICD-10-CM | POA: Diagnosis not present

## 2019-05-12 DIAGNOSIS — E118 Type 2 diabetes mellitus with unspecified complications: Secondary | ICD-10-CM | POA: Diagnosis not present

## 2019-06-02 DIAGNOSIS — I10 Essential (primary) hypertension: Secondary | ICD-10-CM | POA: Diagnosis not present

## 2019-06-02 DIAGNOSIS — F331 Major depressive disorder, recurrent, moderate: Secondary | ICD-10-CM | POA: Diagnosis not present

## 2019-06-02 DIAGNOSIS — G3109 Other frontotemporal dementia: Secondary | ICD-10-CM | POA: Diagnosis not present

## 2019-06-02 DIAGNOSIS — I69352 Hemiplegia and hemiparesis following cerebral infarction affecting left dominant side: Secondary | ICD-10-CM | POA: Diagnosis not present

## 2019-06-02 DIAGNOSIS — R5383 Other fatigue: Secondary | ICD-10-CM | POA: Diagnosis not present

## 2019-06-02 DIAGNOSIS — E782 Mixed hyperlipidemia: Secondary | ICD-10-CM | POA: Diagnosis not present

## 2019-06-02 DIAGNOSIS — Z1159 Encounter for screening for other viral diseases: Secondary | ICD-10-CM | POA: Diagnosis not present

## 2019-06-02 DIAGNOSIS — E118 Type 2 diabetes mellitus with unspecified complications: Secondary | ICD-10-CM | POA: Diagnosis not present

## 2019-07-02 DIAGNOSIS — Z6828 Body mass index (BMI) 28.0-28.9, adult: Secondary | ICD-10-CM | POA: Diagnosis not present

## 2019-07-02 DIAGNOSIS — Z23 Encounter for immunization: Secondary | ICD-10-CM | POA: Diagnosis not present

## 2019-07-02 DIAGNOSIS — G4733 Obstructive sleep apnea (adult) (pediatric): Secondary | ICD-10-CM | POA: Diagnosis not present

## 2019-07-02 DIAGNOSIS — Z1159 Encounter for screening for other viral diseases: Secondary | ICD-10-CM | POA: Diagnosis not present

## 2019-07-02 DIAGNOSIS — E1169 Type 2 diabetes mellitus with other specified complication: Secondary | ICD-10-CM | POA: Diagnosis not present

## 2019-07-08 DIAGNOSIS — E118 Type 2 diabetes mellitus with unspecified complications: Secondary | ICD-10-CM | POA: Diagnosis not present

## 2019-08-06 DIAGNOSIS — L270 Generalized skin eruption due to drugs and medicaments taken internally: Secondary | ICD-10-CM | POA: Diagnosis not present

## 2019-08-06 DIAGNOSIS — L570 Actinic keratosis: Secondary | ICD-10-CM | POA: Diagnosis not present

## 2019-08-06 DIAGNOSIS — L578 Other skin changes due to chronic exposure to nonionizing radiation: Secondary | ICD-10-CM | POA: Diagnosis not present

## 2019-08-06 DIAGNOSIS — L821 Other seborrheic keratosis: Secondary | ICD-10-CM | POA: Diagnosis not present

## 2019-08-07 DIAGNOSIS — E118 Type 2 diabetes mellitus with unspecified complications: Secondary | ICD-10-CM | POA: Diagnosis not present

## 2019-08-10 ENCOUNTER — Encounter: Payer: Self-pay | Admitting: Neurology

## 2019-08-10 ENCOUNTER — Ambulatory Visit: Payer: PPO | Admitting: Neurology

## 2019-08-10 ENCOUNTER — Other Ambulatory Visit: Payer: Self-pay

## 2019-08-10 VITALS — BP 200/89 | HR 70 | Ht 72.0 in | Wt 212.0 lb

## 2019-08-10 DIAGNOSIS — Z8673 Personal history of transient ischemic attack (TIA), and cerebral infarction without residual deficits: Secondary | ICD-10-CM

## 2019-08-10 DIAGNOSIS — G4733 Obstructive sleep apnea (adult) (pediatric): Secondary | ICD-10-CM | POA: Diagnosis not present

## 2019-08-10 DIAGNOSIS — R351 Nocturia: Secondary | ICD-10-CM

## 2019-08-10 DIAGNOSIS — R400 Somnolence: Secondary | ICD-10-CM

## 2019-08-10 NOTE — Patient Instructions (Signed)
As discussed, we will look at your sleep again with respect to sleep apnea diagnosis and order another sleep study after the first of the year when you are in your insurance kicks in. Please try to work on your sleep schedule in the meantime, try to keep a set schedule at night and for your rise time.  Avoid multiple naps during the day. Please also talk to your primary care physician about managing your depression, optimization of depression medication can also help with initiative and motivation and reduce your daytime sleepiness.

## 2019-08-10 NOTE — Progress Notes (Signed)
Subjective:    Patient ID: Mark Stewart is a 73 y.o. male.  HPI     Interim history:   Mark Stewart is a 73 year old right-handed gentleman with an underlying medical history of hypertension, hyperlipidemia, status post splenectomy as a teenager, stroke in 2017, diabetes, cataracts, allergies, and overweight state, who presents for presents for reevaluation of his obstructive sleep apnea.  The patient is accompanied by his wife today.  I last saw him on 05/29/2018, at which time he was compliant with CPAP but had trouble tolerating it.    Today, 08/10/2019: He reports fairly unchanged symptoms, he is sleepy during the day, he takes multiple naps during the day.  He does not keep a very set schedule at night, generally in bed late, pump between 2 and 3 AM, he falls asleep watching TV.  His rise time is generally around 9 AM.  He has nocturia about once or twice per average night.  He does admit to having residual depressive symptoms, some lack of initiative, lack of motivation noted by his wife.  He continues to be on Wellbutrin.  He would be willing to proceed with a sleep study, would like to wait until after his new insurance kicks in in January 2021.  He denies morning headaches.  The patient's allergies, current medications, family history, past medical history, past social history, past surgical history and problem list were reviewed and updated as appropriate.    Previously (copied from previous notes for reference):    I first met him at the request of Dr. Erlinda Stewart and Mark Rubin, NP, on 12/05/2017 at which time he reported snoring and daytime somnolence. His Epworth sleepiness score was 23 at the time. He was advised to proceed with sleep study testing. He had a baseline sleep study, followed by a CPAP titration study. I went over his test results with him in detail today. His baseline sleep study from 01/15/2018 showed a sleep efficiency of 74.1%, sleep latency 92 minutes, REM latency 87.5  minutes. He had an increased percentage of stage II sleep, REM sleep was mildly reduced at 16.9%, total AHI was 11.1 per hour, REM AHI 9.2 per hour, supine sleep was not achieved. Average oxygen saturation was 94%, nadir was 86%. Based on his medical history and sleep related complaints he was advised to pursue CPAP therapy. He had a titration study on 02/26/2018. Sleep efficiency was 80.2%, sleep latency 11 minutes, REM latency 67.5 minutes. He was titrated via full facemask from CPAP of 5 cm to 15 cm. On the final pressure his AHI was 0 per hour with supine REM sleep achieved an O2 nadir of 91%. He had mild PLMS with minimal arousals during that study. Based on his test results I prescribed CPAP therapy for home use at a pressure of 15 cm.   I reviewed his CPAP compliance data from 04/28/2018 through 05/27/2018 which is a total of 30 days, during which time he used his CPAP every night with percent used days greater than 4 hours at 70%, indicating adequate compliance with an average usage of 5 hours and 21 minutes, residual AHI at goal at 2.1 per hour, leak on the high side with the 95th percentile at 38.8 L/m on a pressure of 15 cm with EPR of 3.    12/05/2017: (He) reports snoring and excessive daytime somnolence. I reviewed your office note from 10/25/2017. His Epworth sleepiness score is 23 out of 24 today, fatigue score is 41 out of 63. He  quit smoking, he does not utilize alcohol currently, drinks caffeine in the form of coffee, typically 1 cup per day and 3 cups of tea per day on average. He does not have that scheduled for his bedtime and rise time. Per wife, he tends to sit in the recliner in the dependent watch TV but he dozes off frequently. He may not come to bed until 1 or 2 AM and sometimes as late as 3 AM, her patient his rise time is around 7 but per wife, he may be in bed until 10 unless she makes him get out of bed. He does not have a formal family history of sleep apnea but suspects that  his father could've had it. He had a tonsillectomy as a child. He quit smoking over 30 years ago. He has 20 more is and 4 grandchildren, is retired as an Optometrist. His wife also recently retired. His weight has been stable. He has nocturia about once or twice per night, denies morning headaches. He denies telltale symptoms of restless leg syndrome. Sometimes he mumbles in his sleep and says a few words per wife's report.    His Past Medical History Is Significant For: Past Medical History:  Diagnosis Date  . Allergy   . Cancer ([place])    skin, hand  . Cataract    history of surgery bilaterally  . Diabetes mellitus without complication ([place])   . Hypercholesteremia   . Hypertension   . 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 Stroke [facility]) 02/2016    His Past Surgical History Is Significant For: Past Surgical History:  Procedure Laterality Date  . EYE SURGERY    . HERNIA REPAIR  02/28/2018  . PROSTATE SURGERY    . SPLENECTOMY      His Family History Is Significant For: Family History  Problem Relation Age of Onset  . Breast cancer Mother   . Diabetes Mellitus II Father   . Breast cancer Sister     His Social History Is Significant For: Social History   Socioeconomic History  . Marital status: Married    Spouse name: Mark Stewart  . Number of children: Not on file  . Years of education: Not on file  . Highest education level: Not on file  Occupational History  . Not on file  Social Needs  . Financial resource strain: Not on file  . Food insecurity    Worry: Not on file    Inability: Not on file  . Transportation needs    Medical: Not on file    Non-medical: Not on file  Tobacco Use  . Smoking status: Former Smoker    Packs/day: 1.00    Years: 20.00    Pack years: 20.00  . Smokeless tobacco: Never Used  . Tobacco comment: patient states last cigarette 35 years ago  Substance and Sexual Activity  . Alcohol use: No  . Drug use: No  .  Sexual activity: Not on file  Lifestyle  . Physical activity    Days per week: Not on file    Minutes per session: Not on file  . Stress: Not on file  Relationships  . Social Herbalist on phone: Not on file    Gets together: Not on file    Attends religious service: Not on file    Active member of club or organization: Not on file    Attends meetings of clubs or organizations: Not  on file    Relationship status: Not on file  Other Topics Concern  . Not on file  Social History Narrative  . Not on file    His Allergies Are:  Allergies  Allergen Reactions  . Benazepril Cough  . Metformin And Related Rash  . Penicillins Rash    Has patient had a PCN reaction causing immediate rash, facial/tongue/throat swelling, SOB or lightheadedness with hypotension: yes Has patient had a PCN reaction causing severe rash involving mucus membranes or skin necrosis: no Has patient had a PCN reaction that required hospitalization: no Has patient had a PCN reaction occurring within the last 10 years: no If all of the above answers are "NO", then may proceed with Cephalosporin use.   :   His Current Medications Are:  Outpatient Encounter Medications as of 08/10/2019  Medication Sig  . amLODIPine-Valsartan-HCTZ 10-320-25 MG TABS TAKE 1/2 TABLET BY MOUTH 2 TIMES DAILY  . aspirin EC 81 MG tablet Take 81 mg by mouth daily.  Marland Kitchen atorvastatin (LIPITOR) 80 MG tablet Take 80 mg by mouth daily.  Marland Kitchen buPROPion (WELLBUTRIN XL) 150 MG 24 hr tablet Take 150 mg by mouth daily.  . carvedilol (COREG) 25 MG tablet Take 2 tablets (50 mg total) by mouth 2 (two) times daily. Call for appointment  . cloNIDine (CATAPRES - DOSED IN MG/24 HR) 0.2 mg/24hr patch PLACE 1 PATCH (0.2 MG TOTAL) ONTO THE SKIN ONCE A WEEK.  . empagliflozin (JARDIANCE) 25 MG TABS tablet Take 25 mg by mouth daily.   . fluticasone (FLONASE) 50 MCG/ACT nasal spray Place 2 sprays into both nostrils daily as needed for allergies or rhinitis.  Reported on 04/26/2016  . insulin aspart (NOVOLOG) 100 UNIT/ML injection Inject 40 Units into the skin daily. Uses V-Go 30 units per day sq infustion   . spironolactone (ALDACTONE) 25 MG tablet TAKE 1 TABLET (25 MG TOTAL) BY MOUTH DAILY.  . [DISCONTINUED] aspirin EC 325 MG tablet Take 1 tablet (325 mg total) by mouth daily.   No facility-administered encounter medications on file as of 08/10/2019.   :  Review of Systems:  Out of a complete 14 point review of systems, all are reviewed and negative with the exception of these symptoms as listed below:  Review of Systems  Neurological:       Pt presents today to discuss his sleep. Pt reports that he no longer has cpap. He could tolerate it but didn't like it.  Epworth Sleepiness Scale 0= would never doze 1= slight chance of dozing 2= moderate chance of dozing 3= high chance of dozing  Sitting and reading: 3 Watching TV: 3 Sitting inactive in a public place (ex. Theater or meeting): 3 As a passenger in a car for an hour without a break: 3 Lying down to rest in the afternoon: 3 Sitting and talking to someone: 2 Sitting quietly after lunch (no alcohol): 3 In a car, while stopped in traffic: 3 Total: 23     Objective:  Neurological Exam  Physical Exam Physical Examination:   Vitals:   08/10/19 1413  BP: (!) 200/89  Pulse: 70   General Examination: The patient is a very pleasant 73 y.o. male in no acute distress. He appears well-developed and well-nourished and well groomed.   HEENT:Normocephalic, atraumatic, pupils are equal, round and reactive to light and accommodation. He wears corrective eyeglasses. Face is symmetric, no dysarthria, no facial weakness. No nuchal rigidity. Airway examination reveals adequate dental hygiene, moderate airway crowding secondary to floppy  soft palate and redundant soft palate, wider uvula, tongue is normal and protrudes centrally, palate elevates symmetrically. Mallampati is class II, tonsils are  absent.  Chest:Clear to auscultation without wheezing, rhonchi or crackles noted.  Heart:S1+S2+0, regular and normal without murmurs, rubs or gallops noted.   Abdomen:Soft, non-tender and non-distended with normal bowel sounds appreciated on auscultation.  Extremities:There istracepitting edema.  Skin: Warm and dry without trophic changes noted.  Musculoskeletal: exam reveals no obvious joint deformities, tenderness or joint swelling or erythema.   Neurologically:  Mental status: The patient is awake, alert and oriented in all 4 spheres.Hisimmediate and remote memory, attention, language skills and fund of knowledge are appropriate. There is no evidence of aphasia, agnosia, apraxia or anomia. Speech is clear with normal prosody and enunciation. Thought process is linear. Mood is normaland affect is normal.  Cranial nerves II - XII are as described above under HEENT exam.  Motor exam: Normal bulk, strength and tone is noted. There is no drift,or tremor.Fine motor skills and coordination: grosslyintact.  Cerebellar testing: No dysmetria or intention tremor. There is no truncal or gait ataxia.  Sensory exam: intact to light touch in the upper and lower extremities.  Gait, station and balance:Hestands easily. No veering to one side is noted. No leaning to one side is noted. Posture is age-appropriate and stance is narrow based. Gait showsnormalstride length and normalpace. No problems turning are noted.   Assessmentand Plan:  In summary,Mark R Hartisis a 73 year-old malewith an underlying medical history of hypertension, hyperlipidemia, status post splenectomy as a teenager, stroke in 2017, diabetes, cataracts, allergies, and overweight state, whopresents for Reevaluation of his sleep apnea.  He is no longer on CPAP therapy.  He is sleepy during the day.  We talked about other options for sleep apnea treatment but realistically a positive airway pressure device  would be his most effective realistic treatment option.  We also talked about other causes for daytime somnolence including endocrinology reasons and mood related reasons such as residual depression and anxiety.  He feels that his depression is suboptimally controlled.  His wife has concerns that he is not motivated and has significant lack of initiative.  They are advised to follow-up with his primary care physician regarding optimization of depression management.  I would be happy to pursue a sleep study testing with him.  He has had some weight gain in the interim.  He is willing to consider CPAP therapy again. To that end, I will order repeat sleep study testing. We will call with the test results and proceed from there.  answered all their questions today and the patient and his wife were in agreement.

## 2019-08-12 ENCOUNTER — Telehealth: Payer: Self-pay | Admitting: Cardiovascular Disease

## 2019-08-12 NOTE — Telephone Encounter (Signed)
Returned the call to the patient. He stated that the elevated blood pressures have been a problem on and off for a while now. He is not symptomatic and was calling to request an appointment with Dr. Sallyanne Kuster to readdress. An appointment has been made for 08/18/2019. He has been advised that if his blood pressure stays elevated and/or he becomes symptomatic then he needs to call the office back. He has verbalized his understanding.

## 2019-08-12 NOTE — Telephone Encounter (Signed)
New Message   Pt c/o BP issue:  1. What are your last 5 BP readings? 195/95 2. Are you having any other symptoms (ex. Dizziness, headache, blurred vision, passed out)? Occasional dizziness or nausea 3. What is your medication issue? no

## 2019-08-13 DIAGNOSIS — Z794 Long term (current) use of insulin: Secondary | ICD-10-CM | POA: Diagnosis not present

## 2019-08-13 DIAGNOSIS — E118 Type 2 diabetes mellitus with unspecified complications: Secondary | ICD-10-CM | POA: Diagnosis not present

## 2019-08-13 DIAGNOSIS — E7849 Other hyperlipidemia: Secondary | ICD-10-CM | POA: Diagnosis not present

## 2019-08-18 ENCOUNTER — Ambulatory Visit: Payer: PPO | Admitting: Cardiovascular Disease

## 2019-08-18 ENCOUNTER — Other Ambulatory Visit: Payer: Self-pay

## 2019-08-18 ENCOUNTER — Encounter: Payer: Self-pay | Admitting: Cardiovascular Disease

## 2019-08-18 VITALS — BP 188/94 | HR 65 | Temp 98.4°F | Ht 72.0 in | Wt 214.4 lb

## 2019-08-18 DIAGNOSIS — I701 Atherosclerosis of renal artery: Secondary | ICD-10-CM | POA: Diagnosis not present

## 2019-08-18 DIAGNOSIS — E7800 Pure hypercholesterolemia, unspecified: Secondary | ICD-10-CM

## 2019-08-18 DIAGNOSIS — I69354 Hemiplegia and hemiparesis following cerebral infarction affecting left non-dominant side: Secondary | ICD-10-CM | POA: Diagnosis not present

## 2019-08-18 DIAGNOSIS — I10 Essential (primary) hypertension: Secondary | ICD-10-CM

## 2019-08-18 DIAGNOSIS — E1165 Type 2 diabetes mellitus with hyperglycemia: Secondary | ICD-10-CM

## 2019-08-18 MED ORDER — SPIRONOLACTONE 50 MG PO TABS: 50.0000 mg | ORAL_TABLET | Freq: Every day | ORAL | 11 refills | Status: DC

## 2019-08-18 NOTE — Patient Instructions (Signed)
Medication Instructions:  INCREASE the Spironolactone to 50 mg once daily  *If you need a refill on your cardiac medications before your next appointment, please call your pharmacy*  Lab Work: Your provider would like for you to return on the same day as your Renal Duplex to have the following labs drawn: BMET. You do not need an appointment for the lab. Once in our office lobby there is a podium where you can sign in and ring the doorbell to alert [place] that you are here. The lab is open from 8:00 am to 4:30 pm; closed for lunch from 12:45pm-1:45pm.  If you have labs (blood work) drawn today and your tests are completely normal, you will receive your results only by: Marland Kitchen MyChart Message (if you have MyChart) OR . A paper copy in the mail If you have any lab test that is abnormal or we need to change your treatment, we will call you to review the results.  Testing/Procedures: Your physician has requested that you have a renal artery duplex. During this test, an ultrasound is used to evaluate blood flow to the kidneys. Take your medications as you usually do. This will take place at [place] [address], [address].   No food after 11PM the night before.  Water is OK. (Don't drink liquids if you have been instructed not to for ANOTHER test).  Take two Extra-Strength Gas-X capsules at bedtime the night before test.   Take an additional two Extra-Strength Gas-X capsules three (3) hours before the test or first thing in the morning.    Avoid foods that produce bowel gas, for 24 hours prior to exam (see below).    No breakfast, no chewing gum, no smoking or carbonated beverages.  Patient may take morning medications with water.  Come in for test at least 15 minutes early to register.   Follow-Up: At [facility], you and your health needs are our priority.  As part of our continuing mission to provide you with exceptional heart care, we have created designated Provider Care Teams.  These Care  Teams include your primary Cardiologist (physician) and Advanced Practice Providers (APPs -  Physician Assistants and Nurse Practitioners) who all work together to provide you with the care you need, when you need it.  Your next appointment:   Follow up will be decided after the labs and the doppler.  Other Instructions Dr. Sallyanne Kuster would like you to check your blood pressure DAILY for the next 3 weeks.  Keep a journal of these daily BP and heart rate readings and call our office or send a message through [facility] with the results.

## 2019-08-18 NOTE — Progress Notes (Signed)
Cardiology Office Note:    Date:  08/19/2019   ID:  Mark Stewart, DOB Oct 14, 1945, MRN [id number]  PCP:  Lillard Anes, MD  Cardiologist:  Sanda Klein, MD    Referring MD: Lillard Anes,*   Chief Complaint  Patient presents with   Hypertension    History of Present Illness:    Mark Stewart is a 73 y.o. male with a hx of Long-standing hypertension (over 20 years), insulin requiring type 2 diabetes mellitus, hyperlipidemia, right thalamic ischemic stroke in May 2017.  The patient specifically denies any chest pain at rest exertion, dyspnea at rest or with exertion, orthopnea, paroxysmal nocturnal dyspnea, syncope, palpitations, focal neurological deficits, intermittent claudication, lower extremity edema, unexplained weight gain, cough, hemoptysis or wheezing.  As before, his wife describes him as rather apathetic and reports that he sleeps a lot.  He developed a severe rash with blistering, attributed to Metformin.  He is now only on Jardiance.  Nevertheless, glycemic control is better.  It has improved from 11% to 8.1%, still not at goal.  His most recent LDL cholesterol was borderline acceptable at 75.  His blood pressure is very high.  Even when I allow him to rest and we tested it it was 170/87.  He is on maximum doses of amlodipine, ARB and carvedilol as well as a thiazide diuretic, spironolactone and a clonidine patch.  His blood pressure is high even when he checks it at home with his monitor.  Past Medical History:  Diagnosis Date   Allergy    Cancer ([place])    skin, hand   Cataract    history of surgery bilaterally   Diabetes mellitus without complication ([place])    Hypercholesteremia    Hypertension    Spleen absent    patient states had spleen removed in high school   Stroke [facility]) 02/2016    Past Surgical History:  Procedure Laterality Date   EYE SURGERY     HERNIA REPAIR  02/28/2018   PROSTATE SURGERY     SPLENECTOMY       Current Medications: Current Meds  Medication Sig   amLODIPine-Valsartan-HCTZ 10-320-25 MG TABS TAKE 1/2 TABLET BY MOUTH 2 TIMES DAILY   aspirin EC 81 MG tablet Take 81 mg by mouth daily.   atorvastatin (LIPITOR) 80 MG tablet Take 80 mg by mouth daily.   buPROPion (WELLBUTRIN XL) 150 MG 24 hr tablet Take 150 mg by mouth daily.   carvedilol (COREG) 25 MG tablet Take 2 tablets (50 mg total) by mouth 2 (two) times daily. Call for appointment   cloNIDine (CATAPRES - DOSED IN MG/24 HR) 0.2 mg/24hr patch PLACE 1 PATCH (0.2 MG TOTAL) ONTO THE SKIN ONCE A WEEK.   empagliflozin (JARDIANCE) 25 MG TABS tablet Take 25 mg by mouth daily.    fluticasone (FLONASE) 50 MCG/ACT nasal spray Place 2 sprays into both nostrils daily as needed for allergies or rhinitis. Reported on 04/26/2016   insulin aspart (NOVOLOG) 100 UNIT/ML injection Inject 40 Units into the skin daily. Uses V-Go 30 units per day sq infustion    spironolactone (ALDACTONE) 50 MG tablet Take 1 tablet (50 mg total) by mouth daily.   [DISCONTINUED] spironolactone (ALDACTONE) 25 MG tablet TAKE 1 TABLET (25 MG TOTAL) BY MOUTH DAILY.     Allergies:   Benazepril, Metformin and related, and Penicillins   Social History   Socioeconomic History   Marital status: Married    Spouse name: graye   Number of children:  Not on file   Years of education: Not on file   Highest education level: Not on file  Occupational History   Not on file  Social Needs   Financial resource strain: Not on file   Food insecurity    Worry: Not on file    Inability: Not on file   Transportation needs    Medical: Not on file    Non-medical: Not on file  Tobacco Use   Smoking status: Former Smoker    Packs/day: 1.00    Years: 20.00    Pack years: 20.00   Smokeless tobacco: Never Used   Tobacco comment: patient states last cigarette 35 years ago  Substance and Sexual Activity   Alcohol use: No   Drug use: No   Sexual activity: Not  on file  Lifestyle   Physical activity    Days per week: Not on file    Minutes per session: Not on file   Stress: Not on file  Relationships   Social connections    Talks on phone: Not on file    Gets together: Not on file    Attends religious service: Not on file    Active member of club or organization: Not on file    Attends meetings of clubs or organizations: Not on file    Relationship status: Not on file  Other Topics Concern   Not on file  Social History Narrative   Not on file     Family History: The patient's family history includes Breast cancer in his mother and sister; Diabetes Mellitus II in his father. ROS:   Please see the history of present illness.    All other systems are reviewed and are negative.   EKGs/Labs/Other Studies Reviewed:    MRI from 02/26/2016 shows an acute/subacute ischemic infarct involving the right thalamus, also mild chronic small vessel ischemic disease Echo from 02/27/2016 shows a mildly dilated left ventricle (LVEDD 55 mm may actually be normal), normal EF 55-60 percent, abnormal relaxation, left atrium 39 mm  EKG:  EKG is ordered today.  Shows normal sinus rhythm, normal tracing, QTC 468 ms  Recent Labs: No results found for requested labs within last 8760 hours.  06/02/2019 hemoglobin 15.6, creatinine 1.22, potassium 4.6, normal liver function test, TSH 0.832 Recent Lipid Panel    Component Value Date/Time   CHOL 121 02/27/2016 0536   TRIG 82 02/27/2016 0536   HDL 34 (L) 02/27/2016 0536   CHOLHDL 3.6 02/27/2016 0536   VLDL 16 02/27/2016 0536   LDLCALC 71 02/27/2016 0536   August 20 2018 lipid profile Total cholesterol 158, HDL 51, LDL 111, triglycerides 106 Hemoglobin A1c 12%  06/02/2019 total cholesterol 134, HDL 41, LDL 75, triglycerides 88 Hemoglobin A1c 8.1%  Physical Exam:    VS:  BP (!) 188/94    Pulse 65    Temp 98.4 F (36.9 C)    Ht 6' (1.829 m)    Wt 214 lb 6.4 oz (97.3 kg)    SpO2 97%    BMI 29.08  kg/m     Wt Readings from Last 3 Encounters:  08/18/19 214 lb 6.4 oz (97.3 kg)  08/10/19 212 lb (96.2 kg)  10/17/18 192 lb (87.1 kg)     General: Alert, oriented x3, no distress, appears well Head: no evidence of trauma, PERRL, EOMI, no exophtalmos or lid lag, no myxedema, no xanthelasma; normal ears, nose and oropharynx Neck: normal jugular venous pulsations and no hepatojugular reflux; brisk carotid pulses  without delay and no carotid bruits Chest: clear to auscultation, no signs of consolidation by percussion or palpation, normal fremitus, symmetrical and full respiratory excursions Cardiovascular: normal position and quality of the apical impulse, regular rhythm, normal first and second heart sounds, no murmurs, rubs or gallops Abdomen: no tenderness or distention, no masses by palpation, no abnormal pulsatility or arterial bruits, normal bowel sounds, no hepatosplenomegaly Extremities: no clubbing, cyanosis or edema; 2+ radial, ulnar and brachial pulses bilaterally; 2+ right femoral, posterior tibial and dorsalis pedis pulses; 2+ left femoral, posterior tibial and dorsalis pedis pulses; no subclavian or femoral bruits Neurological: grossly nonfocal Psych: Normal mood and affect   ASSESSMENT:    1. Renal artery stenosis ([place])   2. Essential hypertension   3. Hemiparesis affecting left side as late effect of stroke ([place])   4. Hypercholesterolemia   5. Poorly controlled diabetes mellitus ([place])    PLAN:    In order of problems listed above:  1. HTN: His blood pressure has recently been very high despite taking 6 different hypertensive medications, some and very high dose. His home blood pressure monitor overestimates his blood pressure about about 7 mmHg, but even allowing for that his home blood pressure recordings are high.  Check for renal artery stenosis.  Increase spironolactone to 50 mg daily.  May need to resort to minoxidil. 2. L hemiparesis: Minimal residual deficits.  What  has persisted after his stroke he has apathy and significant deterioration in executive skills.  On aspirin 81 mg daily.  Atrial fibrillation has never been detected. 3. HLP: On high-dose statin with an LDL cholesterol that is better than last year and very close to goal.  No changes made to medications.  Overall all his metabolic parameters are better. 4. DM: Markedly improved control compared to a year ago, although still not quite at goal.  [facility] is an excellent choice considering his cardiovascular problems.   Medication Adjustments/Labs and Tests Ordered: Current medicines are reviewed at length with the patient today.  Concerns regarding medicines are outlined above.  Orders Placed This Encounter  Procedures   Basic metabolic panel   EKG [id number]   VAS US RENAL ARTERY DUPLEX   Meds ordered this encounter  Medications   spironolactone (ALDACTONE) 50 MG tablet    Sig: Take 1 tablet (50 mg total) by mouth daily.    Dispense:  30 tablet    Refill:  11   Patient Instructions  Medication Instructions:  INCREASE the Spironolactone to 50 mg once daily  *If you need a refill on your cardiac medications before your next appointment, please call your pharmacy*  Lab Work: Your provider would like for you to return on the same day as your Renal Duplex to have the following labs drawn: BMET. You do not need an appointment for the lab. Once in our office lobby there is a podium where you can sign in and ring the doorbell to alert [place] that you are here. The lab is open from 8:00 am to 4:30 pm; closed for lunch from 12:45pm-1:45pm.  If you have labs (blood work) drawn today and your tests are completely normal, you will receive your results only by:  [place] (if you have MyChart) OR  A paper copy in the mail If you have any lab test that is abnormal or we need to change your treatment, we will call you to review the results.  Testing/Procedures: Your physician has requested  that you have a renal artery duplex.  During this test, an ultrasound is used to evaluate blood flow to the kidneys. Take your medications as you usually do. This will take place at [place], [address].   No food after 11PM the night before.  Water is OK. (Don't drink liquids if you have been instructed not to for ANOTHER test).  Take two Extra-Strength Gas-X capsules at bedtime the night before test.   Take an additional two Extra-Strength Gas-X capsules three (3) hours before the test or first thing in the morning.    Avoid foods that produce bowel gas, for 24 hours prior to exam (see below).    No breakfast, no chewing gum, no smoking or carbonated beverages.  Patient may take morning medications with water.  Come in for test at least 15 minutes early to register.   Follow-Up: At [facility], you and your health needs are our priority.  As part of our continuing mission to provide you with exceptional heart care, we have created designated Provider Care Teams.  These Care Teams include your primary Cardiologist (physician) and Advanced Practice Providers (APPs -  Physician Assistants and Nurse Practitioners) who all work together to provide you with the care you need, when you need it.  Your next appointment:   Follow up will be decided after the labs and the doppler.  Other Instructions Dr. Sallyanne Kuster would like you to check your blood pressure DAILY for the next 3 weeks.  Keep a journal of these daily BP and heart rate readings and call our office or send a message through [place] with the results.       Signed, Sanda Klein, MD  08/19/2019 5:38 PM    [place]

## 2019-08-19 ENCOUNTER — Encounter: Payer: Self-pay | Admitting: Cardiovascular Disease

## 2019-08-25 ENCOUNTER — Other Ambulatory Visit: Payer: Self-pay | Admitting: *Deleted

## 2019-08-25 ENCOUNTER — Other Ambulatory Visit: Payer: Self-pay

## 2019-08-25 ENCOUNTER — Ambulatory Visit (HOSPITAL_COMMUNITY)
Admission: RE | Admit: 2019-08-25 | Discharge: 2019-08-25 | Disposition: A | Discharge status: Home / Self Care | Payer: PPO | Source: Ambulatory Visit | Attending: Cardiovascular Disease | Admitting: Cardiovascular Disease

## 2019-08-25 DIAGNOSIS — I70242 Atherosclerosis of native arteries of left leg with ulceration of calf: Secondary | ICD-10-CM | POA: Diagnosis not present

## 2019-08-25 DIAGNOSIS — Z1159 Encounter for screening for other viral diseases: Secondary | ICD-10-CM | POA: Diagnosis not present

## 2019-08-25 DIAGNOSIS — I701 Atherosclerosis of renal artery: Secondary | ICD-10-CM | POA: Insufficient documentation

## 2019-08-25 DIAGNOSIS — I10 Essential (primary) hypertension: Secondary | ICD-10-CM | POA: Diagnosis not present

## 2019-08-25 LAB — BASIC METABOLIC PANEL
BUN/Creatinine Ratio: 23 (ref 10–24)
BUN: 34 mg/dL — ABNORMAL HIGH (ref 8–27)
CO2: 25 mmol/L (ref 20–29)
Calcium: 10 mg/dL (ref 8.6–10.2)
Chloride: 100 mmol/L (ref 96–106)
Creatinine, Ser: 1.45 mg/dL — ABNORMAL HIGH (ref 0.76–1.27)
GFR calc Af Amer: 55 mL/min/{1.73_m2} — ABNORMAL LOW (ref >59)
GFR calc non Af Amer: 47 mL/min/{1.73_m2} — ABNORMAL LOW (ref >59)
Glucose: 207 mg/dL — ABNORMAL HIGH (ref 65–99)
Potassium: 5.1 mmol/L (ref 3.5–5.2)
Sodium: 139 mmol/L (ref 134–144)

## 2019-08-26 ENCOUNTER — Telehealth: Payer: Self-pay | Admitting: *Deleted

## 2019-08-26 DIAGNOSIS — I10 Essential (primary) hypertension: Secondary | ICD-10-CM

## 2019-08-26 NOTE — Telephone Encounter (Signed)
Patient made aware of results and verbalized understanding.  The patient has been keeping a log of his blood pressures and will mail them in next week.  Blood pressure this morning was 138/79 and yesterday it was 125/79.

## 2019-08-26 NOTE — Telephone Encounter (Signed)
-----   Message from Sanda Klein, MD sent at 08/26/2019  8:37 AM EST ----- Kidney function showed a slight worsening - not sure if from spironolactone or part of the trend over last 3 years. Please recheck BMET in 2 weeks. Please ask what BP is now.

## 2019-08-31 ENCOUNTER — Other Ambulatory Visit: Payer: Self-pay | Admitting: *Deleted

## 2019-08-31 DIAGNOSIS — I10 Essential (primary) hypertension: Secondary | ICD-10-CM

## 2019-09-01 DIAGNOSIS — G3109 Other frontotemporal dementia: Secondary | ICD-10-CM | POA: Diagnosis not present

## 2019-09-01 DIAGNOSIS — I10 Essential (primary) hypertension: Secondary | ICD-10-CM | POA: Diagnosis not present

## 2019-09-01 DIAGNOSIS — I70242 Atherosclerosis of native arteries of left leg with ulceration of calf: Secondary | ICD-10-CM | POA: Diagnosis not present

## 2019-09-01 DIAGNOSIS — Z1159 Encounter for screening for other viral diseases: Secondary | ICD-10-CM | POA: Diagnosis not present

## 2019-09-01 DIAGNOSIS — Z6828 Body mass index (BMI) 28.0-28.9, adult: Secondary | ICD-10-CM | POA: Diagnosis not present

## 2019-09-01 DIAGNOSIS — E1169 Type 2 diabetes mellitus with other specified complication: Secondary | ICD-10-CM | POA: Diagnosis not present

## 2019-09-01 DIAGNOSIS — E782 Mixed hyperlipidemia: Secondary | ICD-10-CM | POA: Diagnosis not present

## 2019-09-01 DIAGNOSIS — I69352 Hemiplegia and hemiparesis following cerebral infarction affecting left dominant side: Secondary | ICD-10-CM | POA: Diagnosis not present

## 2019-09-07 ENCOUNTER — Telehealth: Payer: Self-pay | Admitting: Cardiovascular Disease

## 2019-09-07 DIAGNOSIS — E118 Type 2 diabetes mellitus with unspecified complications: Secondary | ICD-10-CM | POA: Diagnosis not present

## 2019-09-07 NOTE — Telephone Encounter (Signed)
Pt updated with lab results and MD's recommendation. Pt verbalized understanding and voiced he will come into the office tomorrow to have repeat lab work.

## 2019-09-07 NOTE — Telephone Encounter (Signed)
New message:    Patient calling stating that he had blood work about 2 weeks ago and have not heard from anyone. Please call patient back.

## 2019-09-09 DIAGNOSIS — I10 Essential (primary) hypertension: Secondary | ICD-10-CM | POA: Diagnosis not present

## 2019-09-10 ENCOUNTER — Telehealth: Payer: Self-pay | Admitting: *Deleted

## 2019-09-10 ENCOUNTER — Encounter: Payer: Self-pay | Admitting: *Deleted

## 2019-09-10 LAB — BASIC METABOLIC PANEL
BUN/Creatinine Ratio: 24 (ref 10–24)
BUN: 22 mg/dL (ref 8–27)
CO2: 23 mmol/L (ref 20–29)
Calcium: 9.3 mg/dL (ref 8.6–10.2)
Chloride: 101 mmol/L (ref 96–106)
Creatinine, Ser: 0.92 mg/dL (ref 0.76–1.27)
GFR calc Af Amer: 95 mL/min/{1.73_m2} (ref >59)
GFR calc non Af Amer: 82 mL/min/{1.73_m2} (ref >59)
Glucose: 231 mg/dL — ABNORMAL HIGH (ref 65–99)
Potassium: 4.4 mmol/L (ref 3.5–5.2)
Sodium: 137 mmol/L (ref 134–144)

## 2019-09-10 NOTE — Telephone Encounter (Signed)
Attempted to reach the patient to let him know that the blood pressure readings he sent had been reviewed by Dr. Sallyanne Kuster. Per Dr. Sallyanne Kuster, no changes in medications.

## 2019-09-17 NOTE — Telephone Encounter (Signed)
Unable to reach the patient. Encounter will be closed.

## 2019-09-18 ENCOUNTER — Telehealth: Payer: Self-pay | Admitting: Cardiovascular Disease

## 2019-09-18 NOTE — Telephone Encounter (Signed)
Patient was hoping there would be a Nurse available to discuss his lab results with him.

## 2019-09-18 NOTE — Telephone Encounter (Signed)
Pt aware of lab values  from 09/09/19 kidney functions are normal from previous labs  Pt grateful for phone call ./cy

## 2019-10-01 ENCOUNTER — Telehealth: Payer: Self-pay | Admitting: Physician Assistant

## 2019-10-01 ENCOUNTER — Other Ambulatory Visit: Payer: Self-pay | Admitting: Physician Assistant

## 2019-10-01 MED ORDER — MINOXIDIL 10 MG PO TABS: 5.0000 mg | ORAL_TABLET | Freq: Every day | ORAL | 0 refills | Status: DC

## 2019-10-01 NOTE — Telephone Encounter (Addendum)
Patient contacted after hour answering service regarding uncontrolled hypertension.  Blood pressure initially was 200/120, then on repeat check this has decreased back down to [id number] systolic.  He has been compliant with his blood pressure medications.  He denies any dizziness, headache, blurred vision or feeling of passing out.  He denies any neurological symptoms on the current medication.  He is maxed out on all blood pressure medications.  Recent renal artery ultrasound was normal.  Based on Dr. Victorino December last office note, I recommended addition of 5 mg daily of minoxidil.  He also mentions for the past few days he has been seeing blood in his urine.  He denies any prior history of kidney stone.  I advised him to contact his primary care provider to obtain urinalysis and if blood is confirmed in the urine, he may need urology referral.  He is aware that if blood in the urine worsens, he may need to seek medical attention in the ED.  He was originally scheduled to see me on 10/18/2018, I informed the patient that I want to see him earlier than that.  I will forward this note to our scheduler to arrange an earlier visit with me or Dr. Sallyanne Kuster for BP management.  He has been instructed to bring both his blood pressure cuff and blood pressure diary to the next visit.  Addendum: He mentions his average systolic blood pressure recently has been in the 160s to 180s range.

## 2019-10-01 NOTE — Telephone Encounter (Signed)
Should have availability on January 4 of January 5 when I am opening up some clinic days

## 2019-10-05 NOTE — Telephone Encounter (Signed)
Left a message for the patient to call back to set him up with an appointment with Dr. Sallyanne Kuster on 10/13/2019.

## 2019-10-06 DIAGNOSIS — J40 Bronchitis, not specified as acute or chronic: Secondary | ICD-10-CM | POA: Diagnosis not present

## 2019-10-06 DIAGNOSIS — Z1159 Encounter for screening for other viral diseases: Secondary | ICD-10-CM | POA: Diagnosis not present

## 2019-10-06 DIAGNOSIS — Z6829 Body mass index (BMI) 29.0-29.9, adult: Secondary | ICD-10-CM | POA: Diagnosis not present

## 2019-10-06 DIAGNOSIS — R3121 Asymptomatic microscopic hematuria: Secondary | ICD-10-CM | POA: Diagnosis not present

## 2019-10-07 NOTE — Telephone Encounter (Signed)
The patient has an appointment on 10/13/2019 with Dr. Sallyanne Kuster.

## 2019-10-13 ENCOUNTER — Other Ambulatory Visit: Payer: Self-pay

## 2019-10-13 ENCOUNTER — Encounter: Payer: Self-pay | Admitting: Cardiovascular Disease

## 2019-10-13 ENCOUNTER — Ambulatory Visit: Payer: Medicare HMO | Admitting: Cardiovascular Disease

## 2019-10-13 VITALS — BP 154/80 | HR 67 | Ht 72.0 in | Wt 218.0 lb

## 2019-10-13 DIAGNOSIS — E119 Type 2 diabetes mellitus without complications: Secondary | ICD-10-CM | POA: Diagnosis not present

## 2019-10-13 DIAGNOSIS — G8194 Hemiplegia, unspecified affecting left nondominant side: Secondary | ICD-10-CM | POA: Diagnosis not present

## 2019-10-13 DIAGNOSIS — I10 Essential (primary) hypertension: Secondary | ICD-10-CM

## 2019-10-13 DIAGNOSIS — E7800 Pure hypercholesterolemia, unspecified: Secondary | ICD-10-CM

## 2019-10-13 MED ORDER — MINOXIDIL 10 MG PO TABS: 10.0000 mg | ORAL_TABLET | Freq: Every day | ORAL | 4 refills | Status: DC

## 2019-10-13 NOTE — Patient Instructions (Signed)
Medication Instructions:  INCREASE the Minoxidil to 10 mg once daily  *If you need a refill on your cardiac medications before your next appointment, please call your pharmacy*  Lab Work: None ordered If you have labs (blood work) drawn today and your tests are completely normal, you will receive your results only by: [place] MyChart Message (if you have MyChart) OR . A paper copy in the mail If you have any lab test that is abnormal or we need to change your treatment, we will call you to review the results.  Testing/Procedures: None ordered  Follow-Up: At [facility], you and your health needs are our priority.  As part of our continuing mission to provide you with exceptional heart care, we have created designated Provider Care Teams.  These Care Teams include your primary Cardiologist (physician) and Advanced Practice Providers (APPs -  Physician Assistants and Nurse Practitioners) who all work together to provide you with the care you need, when you need it.  Your next appointment:   3 month(s)  The format for your next appointment:   In Person  Provider:   You may see Sanda Klein, MD or one of the following Advanced Practice Providers on your designated Care Team:    Almyra Deforest, PA-C  Fabian Sharp, PA-C or   Roby Lofts, [place]

## 2019-10-13 NOTE — Progress Notes (Signed)
Cardiology Office Note:    Date:  10/13/2019   ID:  Mark Stewart, DOB 1945/10/24, MRN [id number]  PCP:  Lillard Anes, MD  Cardiologist:  Sanda Klein, MD    Referring MD: Lillard Anes,*   Chief Complaint  Patient presents with  . Hypertension    History of Present Illness:    Mark Stewart is a 74 y.o. male with a hx of Long-standing hypertension (over 20 years), insulin requiring type 2 diabetes mellitus, hyperlipidemia, right thalamic ischemic stroke in May 2017.  The patient specifically denies any chest pain at rest exertion, dyspnea at rest or with exertion, orthopnea, paroxysmal nocturnal dyspnea, syncope, palpitations, focal neurological deficits, intermittent claudication, lower extremity edema, unexplained weight gain, cough, hemoptysis or wheezing.  After adding minoxidil his blood pressure is only showing some improvement trends.  It was reportedly 135/77 when he came in for his renal ultrasound, but at home it has ranged 138/94-160-79 over the last week.  His heart rate is consistently in the 60s and 70s.  He has had occasional dizziness that seems to improve when he lies down, although he has not checked his blood pressure at that time.  Glycemic control continues to improve, most recent hemoglobin A1c down to 7.9%.  His most recent LDL cholesterol was borderline acceptable at 75.  His blood pressure was initially checked at 154/80 today, when I rechecked it it was even higher at 168/80 even after resting for 10 minutes.  Duplex US did not show evidence of renal artery stenosis. 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  Past Medical History:  Diagnosis Date  . Allergy   . Cancer ([facility])    skin, hand  . Cataract    history of surgery bilaterally  . Diabetes mellitus without complication ([place])   . Hypercholesteremia   . Hypertension   . Spleen absent    patient states had spleen removed in high  school  . Stroke [facility]) 02/2016    Past Surgical History:  Procedure Laterality Date  . EYE SURGERY    . HERNIA REPAIR  02/28/2018  . PROSTATE SURGERY    . SPLENECTOMY      Current Medications: No outpatient medications have been marked as taking for the 10/13/19 encounter (Office Visit) with Sanda Klein, MD.     Allergies:   Benazepril, Metformin and related, and Penicillins   Social History   Socioeconomic History  . Marital status: Married    Spouse name: graye  . Number of children: Not on file  . Years of education: Not on file  . Highest education level: Not on file  Occupational History  . Not on file  Tobacco Use  . Smoking status: Former Smoker    Packs/day: 1.00    Years: 20.00    Pack years: 20.00  . Smokeless tobacco: Never Used  . Tobacco comment: patient states last cigarette 35 years ago  Substance and Sexual Activity  . Alcohol use: No  . Drug use: No  . Sexual activity: Not on file  Other Topics Concern  . Not on file  Social History Narrative  . Not on file   Social Determinants of Health   Financial Resource Strain:   . Difficulty of Paying Living Expenses: Not on file  Food Insecurity:   . Worried About Charity fundraiser in the Last Year: Not on file  . Ran Out of Food in the Last Year: Not on file  Transportation Needs:   . Film/video editor (Medical): Not on file  . Lack of Transportation (Non-Medical): Not on file  Physical Activity:   . Days of Exercise per Week: Not on file  . Minutes of Exercise per Session: Not on file  Stress:   . Feeling of Stress : Not on file  Social Connections:   . Frequency of Communication with Friends and Family: Not on file  . Frequency of Social Gatherings with Friends and Family: Not on file  . Attends Religious Services: Not on file  . Active Member of Clubs or Organizations: Not on file  . Attends Archivist Meetings: Not on file  . Marital Status: Not on file     Family  History: The patient's family history includes Breast cancer in his mother and sister; Diabetes Mellitus II in his father. ROS:   Please see the history of present illness.    All other systems are reviewed and are negative.   EKGs/Labs/Other Studies Reviewed:    MRI from 02/26/2016 shows an acute/subacute ischemic infarct involving the right thalamus, also mild chronic small vessel ischemic disease Echo from 02/27/2016 shows a mildly dilated left ventricle (LVEDD 55 mm may actually be normal), normal EF 55-60 percent, abnormal relaxation, left atrium 39 mm  EKG:  EKG is ordered today.  Shows normal sinus rhythm, normal tracing, QTC 468 ms  Recent Labs: 09/09/2019: BUN 22; Creatinine, Ser 0.92; Potassium 4.4; Sodium 137  06/02/2019 hemoglobin 15.6, creatinine 1.22, potassium 4.6, normal liver function test, TSH 0.832 Recent Lipid Panel    Component Value Date/Time   CHOL 121 02/27/2016 0536   TRIG 82 02/27/2016 0536   HDL 34 (L) 02/27/2016 0536   CHOLHDL 3.6 02/27/2016 0536   VLDL 16 02/27/2016 0536   LDLCALC 71 02/27/2016 0536   August 20 2018 lipid profile Total cholesterol 158, HDL 51, LDL 111, triglycerides 106 Hemoglobin A1c 12%  06/02/2019 total cholesterol 134, HDL 41, LDL 75, triglycerides 88 Hemoglobin A1c 8.1%  Physical Exam:    VS:  BP (!) 154/80   Pulse 67   Ht 6' (1.829 m)   Wt 218 lb (98.9 kg)   SpO2 93%   BMI 29.57 kg/m     Wt Readings from Last 3 Encounters:  10/13/19 218 lb (98.9 kg)  08/18/19 214 lb 6.4 oz (97.3 kg)  08/10/19 212 lb (96.2 kg)     General: Alert, oriented x3, no distress, overweight Head: no evidence of trauma, PERRL, EOMI, no exophtalmos or lid lag, no myxedema, no xanthelasma; normal ears, nose and oropharynx Neck: normal jugular venous pulsations and no hepatojugular reflux; brisk carotid pulses without delay and no carotid bruits Chest: clear to auscultation, no signs of consolidation by percussion or palpation, normal  fremitus, symmetrical and full respiratory excursions Cardiovascular: normal position and quality of the apical impulse, regular rhythm, normal first and second heart sounds, no murmurs, rubs or gallops Abdomen: no tenderness or distention, no masses by palpation, no abnormal pulsatility or arterial bruits, normal bowel sounds, no hepatosplenomegaly Extremities: no clubbing, cyanosis or edema; 2+ radial, ulnar and brachial pulses bilaterally; 2+ right femoral, posterior tibial and dorsalis pedis pulses; 2+ left femoral, posterior tibial and dorsalis pedis pulses; no subclavian or femoral bruits Neurological: grossly nonfocal Psych: Normal mood and affect    ASSESSMENT:    1. Essential hypertension   2. Left hemiparesis ([place])   3. Hypercholesterolemia   4. Diabetes mellitus type 2 in nonobese (HCC)  PLAN:    In order of problems listed above:  1. HTN: He is on 7 different antihypertensive medications (maximum doses of amlodipine, valsartan, carvedilol), now including minoxidil. Finally making a dent in his blood pressure.  Today we will increase minoxidil to 10 mg daily.  Please report any additional dizziness.  So far no hirsutism or other side effects.  I was hoping we would be able to discontinue some of his other antihypertensives, but that may not be the case. 2. L hemiparesis: Minimal residual deficits.  What has persisted after his stroke he has apathy and significant deterioration in executive skills.  On aspirin 81 mg daily.  Atrial fibrillation has never been detected. 3. HLP: LDL cholesterol is close to goal on high-dose statin. 4. DM: Although not quite at goal, his glycemic control is markedly improved compared to a year ago and is still improving.   Medication Adjustments/Labs and Tests Ordered: Current medicines are reviewed at length with the patient today.  Concerns regarding medicines are outlined above.  Orders Placed This Encounter  Procedures  . EKG 12-Lead    Meds ordered this encounter  Medications  . minoxidil (LONITEN) 10 MG tablet    Sig: Take 1 tablet (10 mg total) by mouth daily.    Dispense:  30 tablet    Refill:  4   Patient Instructions  Medication Instructions:  INCREASE the Minoxidil to 10 mg once daily  *If you need a refill on your cardiac medications before your next appointment, please call your pharmacy*  Lab Work: None ordered If you have labs (blood work) drawn today and your tests are completely normal, you will receive your results only by: Marland Kitchen MyChart Message (if you have MyChart) OR . A paper copy in the mail If you have any lab test that is abnormal or we need to change your treatment, we will call you to review the results.  Testing/Procedures: None ordered  Follow-Up: At [facility], you and your health needs are our priority.  As part of our continuing mission to provide you with exceptional heart care, we have created designated Provider Care Teams.  These Care Teams include your primary Cardiologist (physician) and Advanced Practice Providers (APPs -  Physician Assistants and Nurse Practitioners) who all work together to provide you with the care you need, when you need it.  Your next appointment:   3 month(s)  The format for your next appointment:   In Person  Provider:   You may see Sanda Klein, MD or one of the following Advanced Practice Providers on your designated Care Team:    Almyra Deforest, PA-C  Fabian Sharp, [place] or   Roby Lofts, PA-C      Signed, Sanda Klein, MD  10/13/2019 10:37 AM    [place]

## 2019-10-19 ENCOUNTER — Ambulatory Visit: Payer: PPO | Admitting: Physician Assistant

## 2019-10-20 DIAGNOSIS — R310 Gross hematuria: Secondary | ICD-10-CM | POA: Diagnosis not present

## 2019-10-20 DIAGNOSIS — N3090 Cystitis, unspecified without hematuria: Secondary | ICD-10-CM | POA: Diagnosis not present

## 2019-10-20 DIAGNOSIS — N3001 Acute cystitis with hematuria: Secondary | ICD-10-CM | POA: Diagnosis not present

## 2019-10-20 DIAGNOSIS — N318 Other neuromuscular dysfunction of bladder: Secondary | ICD-10-CM | POA: Diagnosis not present

## 2019-10-21 DIAGNOSIS — N538 Other male sexual dysfunction: Secondary | ICD-10-CM | POA: Diagnosis not present

## 2019-10-21 DIAGNOSIS — N401 Enlarged prostate with lower urinary tract symptoms: Secondary | ICD-10-CM | POA: Diagnosis not present

## 2019-10-30 DIAGNOSIS — E113293 Type 2 diabetes mellitus with mild nonproliferative diabetic retinopathy without macular edema, bilateral: Secondary | ICD-10-CM | POA: Diagnosis not present

## 2019-11-09 DIAGNOSIS — C4339 Malignant melanoma of other parts of face: Secondary | ICD-10-CM | POA: Diagnosis not present

## 2019-11-09 DIAGNOSIS — L270 Generalized skin eruption due to drugs and medicaments taken internally: Secondary | ICD-10-CM | POA: Diagnosis not present

## 2019-11-09 DIAGNOSIS — L578 Other skin changes due to chronic exposure to nonionizing radiation: Secondary | ICD-10-CM | POA: Diagnosis not present

## 2019-11-09 DIAGNOSIS — L821 Other seborrheic keratosis: Secondary | ICD-10-CM | POA: Diagnosis not present

## 2019-11-09 DIAGNOSIS — L570 Actinic keratosis: Secondary | ICD-10-CM | POA: Diagnosis not present

## 2019-11-09 HISTORY — PX: HERNIA REPAIR: SHX51

## 2019-11-11 DIAGNOSIS — C4339 Malignant melanoma of other parts of face: Secondary | ICD-10-CM | POA: Diagnosis not present

## 2019-11-13 DIAGNOSIS — E118 Type 2 diabetes mellitus with unspecified complications: Secondary | ICD-10-CM | POA: Diagnosis not present

## 2019-11-13 DIAGNOSIS — E7849 Other hyperlipidemia: Secondary | ICD-10-CM | POA: Diagnosis not present

## 2019-11-13 DIAGNOSIS — Z794 Long term (current) use of insulin: Secondary | ICD-10-CM | POA: Diagnosis not present

## 2019-11-17 DIAGNOSIS — L7682 Other postprocedural complications of skin and subcutaneous tissue: Secondary | ICD-10-CM | POA: Diagnosis not present

## 2019-11-17 DIAGNOSIS — C4339 Malignant melanoma of other parts of face: Secondary | ICD-10-CM | POA: Diagnosis not present

## 2019-11-20 DIAGNOSIS — E118 Type 2 diabetes mellitus with unspecified complications: Secondary | ICD-10-CM | POA: Diagnosis not present

## 2019-11-22 ENCOUNTER — Other Ambulatory Visit: Payer: Self-pay | Admitting: Physician Assistant

## 2019-12-07 DIAGNOSIS — Z85820 Personal history of malignant melanoma of skin: Secondary | ICD-10-CM | POA: Diagnosis not present

## 2019-12-07 DIAGNOSIS — L578 Other skin changes due to chronic exposure to nonionizing radiation: Secondary | ICD-10-CM | POA: Diagnosis not present

## 2019-12-07 DIAGNOSIS — L821 Other seborrheic keratosis: Secondary | ICD-10-CM | POA: Diagnosis not present

## 2019-12-07 DIAGNOSIS — L570 Actinic keratosis: Secondary | ICD-10-CM | POA: Diagnosis not present

## 2019-12-08 DIAGNOSIS — C4339 Malignant melanoma of other parts of face: Secondary | ICD-10-CM | POA: Diagnosis not present

## 2019-12-11 ENCOUNTER — Other Ambulatory Visit: Payer: Self-pay | Admitting: Cardiovascular Disease

## 2020-01-01 DIAGNOSIS — E118 Type 2 diabetes mellitus with unspecified complications: Secondary | ICD-10-CM | POA: Diagnosis not present

## 2020-01-07 ENCOUNTER — Encounter: Payer: Self-pay | Admitting: Legal Medicine

## 2020-01-12 ENCOUNTER — Other Ambulatory Visit: Payer: Self-pay | Admitting: Cardiovascular Disease

## 2020-01-12 NOTE — Telephone Encounter (Signed)
Rx request sent to pharmacy.  

## 2020-01-14 ENCOUNTER — Other Ambulatory Visit: Payer: Self-pay | Admitting: Cardiovascular Disease

## 2020-01-18 NOTE — Telephone Encounter (Signed)
Lisinopril Hctz refill request refused. Not active Rx on med list

## 2020-01-19 ENCOUNTER — Other Ambulatory Visit: Payer: Self-pay

## 2020-01-20 ENCOUNTER — Other Ambulatory Visit: Payer: Self-pay

## 2020-01-20 ENCOUNTER — Ambulatory Visit: Payer: Medicare HMO | Admitting: Cardiovascular Disease

## 2020-01-20 ENCOUNTER — Encounter: Payer: Self-pay | Admitting: Cardiovascular Disease

## 2020-01-20 VITALS — BP 140/66 | HR 68 | Temp 98.6°F | Ht 72.0 in | Wt 214.0 lb

## 2020-01-20 DIAGNOSIS — I69354 Hemiplegia and hemiparesis following cerebral infarction affecting left non-dominant side: Secondary | ICD-10-CM | POA: Diagnosis not present

## 2020-01-20 DIAGNOSIS — E7800 Pure hypercholesterolemia, unspecified: Secondary | ICD-10-CM

## 2020-01-20 DIAGNOSIS — I10 Essential (primary) hypertension: Secondary | ICD-10-CM | POA: Diagnosis not present

## 2020-01-20 DIAGNOSIS — E119 Type 2 diabetes mellitus without complications: Secondary | ICD-10-CM

## 2020-01-20 MED ORDER — SPIRONOLACTONE 25 MG PO TABS: 25.0000 mg | ORAL_TABLET | Freq: Every day | ORAL | 11 refills | Status: DC

## 2020-01-20 MED ORDER — ATORVASTATIN CALCIUM 80 MG PO TABS: 80.0000 mg | ORAL_TABLET | Freq: Every day | ORAL | 6 refills | Status: DC

## 2020-01-20 NOTE — Progress Notes (Signed)
Cardiology Office Note:    Date:  01/22/2020   ID:  Mark Stewart, DOB 02-Sep-1946, MRN [id number]  PCP:  Lillard Anes, MD  Cardiologist:  Sanda Klein, MD    Referring MD: Lillard Anes,*   Chief Complaint  Patient presents with  . Hypertension    History of Present Illness:    Mark Stewart is a 74 y.o. male with a hx of Long-standing hypertension (over 20 years), insulin requiring type 2 diabetes mellitus, hyperlipidemia, right thalamic ischemic stroke in May 2017.  He feels well and has no cardiovascular complaints.  His wife still describes him as being apathetic which has been an issue ever since his stroke.  The patient specifically denies any chest pain at rest exertion, dyspnea at rest or with exertion, orthopnea, paroxysmal nocturnal dyspnea, syncope, palpitations, focal neurological deficits, intermittent claudication, lower extremity edema, unexplained weight gain, cough, hemoptysis or wheezing.  His blood pressure is borderline high today, but much lower at home.  Although there are some variability, he typically has a systolic blood pressure in the 110-125 range with diastolic blood pressures in the 65-80 range.  He has occasional orthostatic dizziness.  He does not have a lot of problems with edema, although his ankles are occasionally swollen at the end of the day, but always back to normal by the next morning.  Glycemic control is fair, although not perfect.  His most recent LDL level was 75.  Duplex US did not show evidence of renal artery stenosis. 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  Past Medical History:  Diagnosis Date  . Allergy   . Cancer ([place])    skin, hand  . Cataract    history of surgery bilaterally  . Diabetes mellitus without complication ([place])   . Hypercholesteremia   . Hypertension   . 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 Stroke [facility]) 02/2016     Past Surgical History:  Procedure Laterality Date  . EYE SURGERY    . HERNIA REPAIR  02/28/2018  . PROSTATE SURGERY    . SPLENECTOMY      Current Medications: Current Meds  Medication Sig  . amLODIPine-Valsartan-HCTZ 10-320-25 MG TABS TAKE 1/2 TABLET BY MOUTH 2 TIMES DAILY  . aspirin EC 81 MG tablet Take 81 mg by mouth daily.  Marland Kitchen atorvastatin (LIPITOR) 80 MG tablet Take 1 tablet (80 mg total) by mouth daily.  Marland Kitchen buPROPion (WELLBUTRIN XL) 150 MG 24 hr tablet Take 150 mg by mouth daily.  . carvedilol (COREG) 25 MG tablet Take 2 tablets (50 mg total) by mouth 2 (two) times daily. Call for appointment  . cloNIDine (CATAPRES - DOSED IN MG/24 HR) 0.2 mg/24hr patch PLACE 1 PATCH (0.2 MG TOTAL) ONTO THE SKIN ONCE A WEEK.  . empagliflozin (JARDIANCE) 25 MG TABS tablet Take 25 mg by mouth daily.   . fluticasone (FLONASE) 50 MCG/ACT nasal spray Place 2 sprays into both nostrils daily as needed for allergies or rhinitis. Reported on 04/26/2016  . insulin aspart (NOVOLOG) 100 UNIT/ML injection Inject 40 Units into the skin daily. Uses V-Go 30 units per day sq infustion   . minoxidil (LONITEN) 10 MG tablet TAKE 1 TABLET BY MOUTH EVERY DAY  . spironolactone (ALDACTONE) 25 MG tablet Take 1 tablet (25 mg total) by mouth daily.  . [DISCONTINUED] spironolactone (ALDACTONE) 50 MG tablet Take 1 tablet (50 mg total) by mouth daily.  Allergies:   Benazepril, Metformin and related, and Penicillins   Social History   Socioeconomic History  . Marital status: Married    Spouse name: graye  . Number of children: Not on file  . Years of education: Not on file  . Highest education level: Not on file  Occupational History  . Not on file  Tobacco Use  . Smoking status: Former Smoker    Packs/day: 1.00    Years: 20.00    Pack years: 20.00  . Smokeless tobacco: Never Used  . Tobacco comment: patient states last cigarette 35 years ago  Substance and Sexual Activity  . Alcohol use: No  . Drug use: No   . Sexual activity: Not on file  Other Topics Concern  . Not on file  Social History Narrative  . Not on file   Social Determinants of Health   Financial Resource Strain:   . Difficulty of Paying Living Expenses:   Food Insecurity:   . Worried About Charity fundraiser in the Last Year:   . Arboriculturist in the Last Year:   Transportation Needs:   . Film/video editor (Medical):   Marland Kitchen Lack of Transportation (Non-Medical):   Physical Activity:   . Days of Exercise per Week:   . Minutes of Exercise per Session:   Stress:   . Feeling of Stress :   Social Connections:   . Frequency of Communication with Friends and Family:   . Frequency of Social Gatherings with Friends and Family:   . Attends Religious Services:   . Active Member of Clubs or Organizations:   . Attends Archivist Meetings:   Marland Kitchen Marital Status:      Family History: The patient's family history includes Breast cancer in his mother and sister; Diabetes Mellitus II in his father. ROS:   Please see the history of present illness.    All other systems are reviewed and are negative.   EKGs/Labs/Other Studies Reviewed:    MRI from 02/26/2016 shows an acute/subacute ischemic infarct involving the right thalamus, also mild chronic small vessel ischemic disease Echo from 02/27/2016 shows a mildly dilated left ventricle (LVEDD 55 mm may actually be normal), normal EF 55-60 percent, abnormal relaxation, left atrium 39 mm  EKG:  EKG is not ordered today.  His most recent tracing showed normal sinus rhythm, normal tracing, QTC 468 ms  Recent Labs: 09/09/2019: BUN 22; Creatinine, Ser 0.92; Potassium 4.4; Sodium 137  06/02/2019 hemoglobin 15.6, creatinine 1.22, potassium 4.6, normal liver function test, TSH 0.832 Recent Lipid Panel    Component Value Date/Time   CHOL 121 02/27/2016 0536   TRIG 82 02/27/2016 0536   HDL 34 (L) 02/27/2016 0536   CHOLHDL 3.6 02/27/2016 0536   VLDL 16 02/27/2016 0536    LDLCALC 71 02/27/2016 0536   August 20 2018 lipid profile Total cholesterol 158, HDL 51, LDL 111, triglycerides 106 Hemoglobin A1c 12%  06/02/2019 total cholesterol 134, HDL 41, LDL 75, triglycerides 88 Hemoglobin A1c 8.1%  Physical Exam:    VS:  BP 140/66   Pulse 68   Temp 98.6 F (37 C)   Ht 6' (1.829 m)   Wt 214 lb (97.1 kg)   BMI 29.02 kg/m     Wt Readings from Last 3 Encounters:  01/20/20 214 lb (97.1 kg)  10/13/19 218 lb (98.9 kg)  08/18/19 214 lb 6.4 oz (97.3 kg)    General: Alert, oriented x3, no distress, overweight Head: no  evidence of trauma, PERRL, EOMI, no exophtalmos or lid lag, no myxedema, no xanthelasma; normal ears, nose and oropharynx Neck: normal jugular venous pulsations and no hepatojugular reflux; brisk carotid pulses without delay and no carotid bruits Chest: clear to auscultation, no signs of consolidation by percussion or palpation, normal fremitus, symmetrical and full respiratory excursions Cardiovascular: normal position and quality of the apical impulse, regular rhythm, normal first and second heart sounds, no murmurs, rubs or gallops Abdomen: no tenderness or distention, no masses by palpation, no abnormal pulsatility or arterial bruits, normal bowel sounds, no hepatosplenomegaly Extremities: no clubbing, cyanosis or edema; 2+ radial, ulnar and brachial pulses bilaterally; 2+ right femoral, posterior tibial and dorsalis pedis pulses; 2+ left femoral, posterior tibial and dorsalis pedis pulses; no subclavian or femoral bruits Neurological: grossly nonfocal Psych: Normal mood and affect   ASSESSMENT:    1. Essential hypertension   2. Hemiparesis affecting left side as late effect of stroke ([place])   3. Hypercholesterolemia   4. Diabetes mellitus type 2 in nonobese [facility])    PLAN:    In order of problems listed above:  1. HTN: Remarkably severe and difficult to control.  Currently his blood pressure control is acceptable and he now has  occasional symptoms of mild hypotension.  We will decrease the dose of spironolactone to 25 mg daily and continue the current maximum doses of amlodipine, valsartan, carvedilol in addition to his minoxidil and hydrochlorothiazide.    2. L hemiparesis: No new neurological events.  Minimal residual deficits.  What has persisted after his stroke he has apathy and significant deterioration in executive skills.  On aspirin 81 mg daily.  Atrial fibrillation has never been detected. 3. HLP: LDL cholesterol is close to goal.  Borderline low HDL.  Continue same medications. 4. DM: He is to continue to work on improving his glycemic control by limiting the intake of carbohydrates.  Medication Adjustments/Labs and Tests Ordered: Current medicines are reviewed at length with the patient today.  Concerns regarding medicines are outlined above.  No orders of the defined types were placed in this encounter.  Meds ordered this encounter  Medications  . spironolactone (ALDACTONE) 25 MG tablet    Sig: Take 1 tablet (25 mg total) by mouth daily.    Dispense:  30 tablet    Refill:  11   Patient Instructions  Medication Instructions:  DECREASE the Spironolactone to 25 mg once daily  *If you need a refill on your cardiac medications before your next appointment, please call your pharmacy*   Lab Work: None ordered  If you have labs (blood work) drawn today and your tests are completely normal, you will receive your results only by: Marland Kitchen MyChart Message (if you have MyChart) OR . A paper copy in the mail If you have any lab test that is abnormal or we need to change your treatment, we will call you to review the results.   Testing/Procedures: None ordered   Follow-Up: At [facility], you and your health needs are our priority.  As part of our continuing mission to provide you with exceptional heart care, we have created designated Provider Care Teams.  These Care Teams include your primary Cardiologist  (physician) and Advanced Practice Providers (APPs -  Physician Assistants and Nurse Practitioners) who all work together to provide you with the care you need, when you need it.  We recommend signing up for the patient portal called "MyChart".  Sign up information is provided on this After Visit  Summary.  MyChart is used to connect with patients for Virtual Visits (Telemedicine).  Patients are able to view lab/test results, encounter notes, upcoming appointments, etc.  Non-urgent messages can be sent to your provider as well.   To learn more about what you can do with MyChart, go to [email or web address].    Your next appointment:   6 month(s)  The format for your next appointment:   In Person  Provider:   You may see Sanda Klein, MD or one of the following Advanced Practice Providers on your designated Care Team:    Almyra Deforest, PA-C  Fabian Sharp, [place] or   Roby Lofts, PA-C       Signed, Sanda Klein, MD  01/22/2020 1:11 PM    [place]

## 2020-01-20 NOTE — Patient Instructions (Signed)
Medication Instructions:  DECREASE the Spironolactone to 25 mg once daily  *If you need a refill on your cardiac medications before your next appointment, please call your pharmacy*   Lab Work: None ordered  If you have labs (blood work) drawn today and your tests are completely normal, you will receive your results only by: Marland Kitchen MyChart Message (if you have MyChart) OR . A paper copy in the mail If you have any lab test that is abnormal or we need to change your treatment, we will call you to review the results.   Testing/Procedures: None ordered   Follow-Up: At [facility], you and your health needs are our priority.  As part of our continuing mission to provide you with exceptional heart care, we have created designated Provider Care Teams.  These Care Teams include your primary Cardiologist (physician) and Advanced Practice Providers (APPs -  Physician Assistants and Nurse Practitioners) who all work together to provide you with the care you need, when you need it.  We recommend signing up for the patient portal called "MyChart".  Sign up information is provided on this After Visit Summary.  MyChart is used to connect with patients for Virtual Visits (Telemedicine).  Patients are able to view lab/test results, encounter notes, upcoming appointments, etc.  Non-urgent messages can be sent to your provider as well.   To learn more about what you can do with MyChart, go to [email or web address].    Your next appointment:   6 month(s)  The format for your next appointment:   In Person  Provider:   You may see Sanda Klein, MD or one of the following Advanced Practice Providers on your designated Care Team:    Almyra Deforest, PA-C  Fabian Sharp, PA-C or   Roby Lofts, [place]

## 2020-01-22 ENCOUNTER — Encounter: Payer: Self-pay | Admitting: Cardiovascular Disease

## 2020-02-10 DIAGNOSIS — E7849 Other hyperlipidemia: Secondary | ICD-10-CM | POA: Diagnosis not present

## 2020-02-10 DIAGNOSIS — Z794 Long term (current) use of insulin: Secondary | ICD-10-CM | POA: Diagnosis not present

## 2020-02-10 DIAGNOSIS — E118 Type 2 diabetes mellitus with unspecified complications: Secondary | ICD-10-CM | POA: Diagnosis not present

## 2020-02-23 ENCOUNTER — Other Ambulatory Visit: Payer: Self-pay

## 2020-02-23 ENCOUNTER — Encounter: Payer: Self-pay | Admitting: Legal Medicine

## 2020-02-23 ENCOUNTER — Ambulatory Visit (INDEPENDENT_AMBULATORY_CARE_PROVIDER_SITE_OTHER): Payer: Medicare HMO | Admitting: Legal Medicine

## 2020-02-23 VITALS — BP 150/78 | HR 84 | Temp 97.4°F | Resp 18 | Ht 72.0 in | Wt 214.8 lb

## 2020-02-23 DIAGNOSIS — N63 Unspecified lump in unspecified breast: Secondary | ICD-10-CM | POA: Diagnosis not present

## 2020-02-23 DIAGNOSIS — G4733 Obstructive sleep apnea (adult) (pediatric): Secondary | ICD-10-CM

## 2020-02-23 DIAGNOSIS — K219 Gastro-esophageal reflux disease without esophagitis: Secondary | ICD-10-CM | POA: Insufficient documentation

## 2020-02-23 DIAGNOSIS — J449 Chronic obstructive pulmonary disease, unspecified: Secondary | ICD-10-CM | POA: Diagnosis not present

## 2020-02-23 DIAGNOSIS — M545 Low back pain, unspecified: Secondary | ICD-10-CM | POA: Insufficient documentation

## 2020-02-23 DIAGNOSIS — S335XXA Sprain of ligaments of lumbar spine, initial encounter: Secondary | ICD-10-CM | POA: Diagnosis not present

## 2020-02-23 DIAGNOSIS — R05 Cough: Secondary | ICD-10-CM

## 2020-02-23 DIAGNOSIS — R053 Chronic cough: Secondary | ICD-10-CM

## 2020-02-23 DIAGNOSIS — K2100 Gastro-esophageal reflux disease with esophagitis, without bleeding: Secondary | ICD-10-CM

## 2020-02-23 LAB — PULMONARY FUNCTION TEST
FEV1/FVC: 38 %
FEV1: 33 L
FVC: 0.14 L

## 2020-02-23 MED ORDER — ALBUTEROL SULFATE (2.5 MG/3ML) 0.083% IN NEBU: 2.5000 mg | INHALATION_SOLUTION | Freq: Four times a day (QID) | RESPIRATORY_TRACT | 1 refills | Status: DC | PRN

## 2020-02-23 MED ORDER — OMEPRAZOLE 40 MG PO CPDR: 40.0000 mg | DELAYED_RELEASE_CAPSULE | Freq: Every day | ORAL | 3 refills | Status: DC

## 2020-02-23 MED ORDER — CYCLOBENZAPRINE HCL 10 MG PO TABS: 10.0000 mg | ORAL_TABLET | Freq: Three times a day (TID) | ORAL | 0 refills | Status: DC | PRN

## 2020-02-23 NOTE — Assessment & Plan Note (Signed)
Patient not using CPAP

## 2020-02-23 NOTE — Assessment & Plan Note (Signed)
Patient has low back pain recurrent problem after helping son move.  We discussed muslc relaxer and low back exercises.

## 2020-02-23 NOTE — Progress Notes (Signed)
Acute Office Visit  Subjective:    Patient ID: Mark Stewart, male    DOB: [birth year], 74 y.o.   MRN: [id number]  Chief Complaint  Patient presents with  . Cough  . Back Pain    HPI Patient is in today for chronic cough that is worse at night and he needs to put his head on pillows.  He gets feeling in throat. He has sleep apnea but not on CPAP that was stopped 1 year ago.  He needs another sleep test. He eats before bed time. He denies smoking history.  Patient notes breast mass right breast for months, tender, no nipple discharge.  It is at 2 O'clock.  He is worried and wasn't it evaluated.  Past Medical History:  Diagnosis Date  . Allergy   . Cancer ([facility])    skin, hand  . Cataract    history of surgery bilaterally  . Diabetes mellitus without complication ([place])   . Hypercholesteremia   . Hypertension   . 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 [facility]    Past Surgical History:  Procedure Laterality Date  . EYE SURGERY    . HERNIA REPAIR  02/28/2018  . PROSTATE SURGERY    . SPLENECTOMY      Family History  Problem Relation Age of Onset  . Breast cancer Mother   . Diabetes Mellitus II Father   . Breast cancer Sister     Social History   Socioeconomic History  . Marital status: Married    Spouse name: graye  . Number of children: Not on file  . Years of education: Not on file  . Highest education level: Not on file  Occupational History  . Not on file  Tobacco Use  . Smoking status: Former Smoker    Packs/day: 1.00    Years: 20.00    Pack years: 20.00  . Smokeless tobacco: Never Used  . Tobacco comment: patient states last cigarette 35 years ago  Substance and Sexual Activity  . Alcohol use: No  . Drug use: No  . Sexual activity: Not on file  Other Topics Concern  . Not on file  Social History Narrative  . Not on file   Social Determinants of Health   Financial Resource Strain:   . Difficulty of  Paying Living Expenses:   Food Insecurity:   . Worried About Charity fundraiser in the Last Year:   . Arboriculturist in the Last Year:   Transportation Needs:   . Film/video editor (Medical):   Marland Kitchen Lack of Transportation (Non-Medical):   Physical Activity:   . Days of Exercise per Week:   . Minutes of Exercise per Session:   Stress:   . Feeling of Stress :   Social Connections:   . Frequency of Communication with Friends and Family:   . Frequency of Social Gatherings with Friends and Family:   . Attends Religious Services:   . Active Member of Clubs or Organizations:   . Attends Archivist Meetings:   Marland Kitchen Marital Status:   Intimate Partner Violence:   . Fear of Current or Ex-Partner:   . Emotionally Abused:   Marland Kitchen Physically Abused:   . Sexually Abused:     Outpatient Medications Prior to Visit  Medication Sig Dispense Refill  . amLODIPine-Valsartan-HCTZ 10-320-25 MG TABS TAKE 1/2 TABLET BY MOUTH 2 TIMES DAILY 90 tablet 3  .  aspirin EC 81 MG tablet Take 81 mg by mouth daily.    Marland Kitchen atorvastatin (LIPITOR) 80 MG tablet Take 1 tablet (80 mg total) by mouth daily. 30 tablet 6  . BAQSIMI TWO PACK 3 MG/DOSE POWD     . buPROPion (WELLBUTRIN XL) 150 MG 24 hr tablet Take 150 mg by mouth daily.    . carvedilol (COREG) 25 MG tablet Take 2 tablets (50 mg total) by mouth 2 (two) times daily. Call for appointment 360 tablet 1  . cloNIDine (CATAPRES - DOSED IN MG/24 HR) 0.2 mg/24hr patch PLACE 1 PATCH (0.2 MG TOTAL) ONTO THE SKIN ONCE A WEEK. 12 patch 3  . empagliflozin (JARDIANCE) 25 MG TABS tablet Take 25 mg by mouth daily.     . fluticasone (FLONASE) 50 MCG/ACT nasal spray Place 2 sprays into both nostrils daily as needed for allergies or rhinitis. Reported on 04/26/2016    . insulin aspart (NOVOLOG) 100 UNIT/ML injection Inject 40 Units into the skin daily. Uses V-Go 30 units per day sq infustion     . Insulin Disposable Pump (OMNIPOD DASH 5 PACK PODS) MISC     . minoxidil  (LONITEN) 10 MG tablet TAKE 1 TABLET BY MOUTH EVERY DAY 90 tablet 1  . spironolactone (ALDACTONE) 50 MG tablet     . spironolactone (ALDACTONE) 25 MG tablet Take 1 tablet (25 mg total) by mouth daily. 30 tablet 11   No facility-administered medications prior to visit.    Allergies  Allergen Reactions  . Benazepril Cough  . Metformin And Related Rash  . Penicillins Rash    Has patient had a PCN reaction causing immediate rash, facial/tongue/throat swelling, SOB or lightheadedness with hypotension: yes Has patient had a PCN reaction causing severe rash involving mucus membranes or skin necrosis: no Has patient had a PCN reaction that required hospitalization: no Has patient had a PCN reaction occurring within the last 10 years: no If all of the above answers are "NO", then may proceed with Cephalosporin use.     Review of Systems  Constitutional: Negative.   HENT: Negative.   Eyes: Negative.   Respiratory: Positive for cough.   Cardiovascular: Negative.   Gastrointestinal: Negative.   Endocrine: Negative.   Genitourinary: Negative.   Musculoskeletal: Negative.   Neurological: Negative.   Psychiatric/Behavioral: Negative.        Objective:    Physical Exam Vitals reviewed.  Constitutional:      Appearance: Normal appearance.  HENT:     Head: Normocephalic and atraumatic.     Nose: Nose normal.  Cardiovascular:     Rate and Rhythm: Normal rate and regular rhythm.     Pulses: Normal pulses.     Heart sounds: Normal heart sounds.  Pulmonary:     Effort: Pulmonary effort is normal.     Breath sounds: Normal breath sounds.  Chest:     Chest wall: Mass present.     Breasts:        Right: Mass present.        Left: Normal.       Comments: Mass in right breast Abdominal:     General: Bowel sounds are normal.  Musculoskeletal:        General: Normal range of motion.     Comments: Back pain from helping son moving  Skin:    General: Skin is warm.  Neurological:      General: No focal deficit present.     Mental Status: He is alert and  oriented to person, place, and time. Mental status is at baseline.  Psychiatric:        Mood and Affect: Mood normal.   PFT: FVC 126%, FEV1 68%, FEV1/FVC 51%, PEF 33%  BP (!) 150/78   Pulse 84   Temp (!) 97.4 F (36.3 C)   Resp 18   Ht 6' (1.829 m)   Wt 214 lb 12.8 oz (97.4 kg)   SpO2 95%   BMI 29.13 kg/m  Wt Readings from Last 3 Encounters:  02/23/20 214 lb 12.8 oz (97.4 kg)  01/20/20 214 lb (97.1 kg)  10/13/19 218 lb (98.9 kg)    Health Maintenance Due  Topic Date Due  . Hepatitis C Screening  Never done  . FOOT EXAM  Never done  . OPHTHALMOLOGY EXAM  Never done  . COVID-19 Vaccine (1) Never done  . TETANUS/TDAP  Never done  . COLONOSCOPY  Never done  . PNA vac Low Risk Adult (1 of 2 - PCV13) Never done  . HEMOGLOBIN A1C  08/29/2016    There are no preventive care reminders to display for this patient.   No results found for: TSH Lab Results  Component Value Date   WBC 6.6 02/27/2016   HGB 14.2 02/27/2016   HCT 42.9 02/27/2016   MCV 91.9 02/27/2016   PLT 248 02/27/2016   Lab Results  Component Value Date   NA 137 09/09/2019   K 4.4 09/09/2019   CO2 23 09/09/2019   GLUCOSE 231 (H) 09/09/2019   BUN 22 09/09/2019   CREATININE 0.92 09/09/2019   BILITOT 0.8 02/27/2016   ALKPHOS 72 02/27/2016   AST 14 (L) 02/27/2016   ALT 16 (L) 02/27/2016   PROT 6.4 (L) 02/27/2016   ALBUMIN 3.1 (L) 02/27/2016   CALCIUM 9.3 09/09/2019   ANIONGAP 6 02/27/2016   Lab Results  Component Value Date   CHOL 121 02/27/2016   Lab Results  Component Value Date   HDL 34 (L) 02/27/2016   Lab Results  Component Value Date   LDLCALC 71 02/27/2016   Lab Results  Component Value Date   TRIG 82 02/27/2016   Lab Results  Component Value Date   CHOLHDL 3.6 02/27/2016   Lab Results  Component Value Date   HGBA1C 10.7 (H) 02/27/2016       Assessment & Plan:   Problem List Items Addressed This  Visit      Respiratory   Obstructive sleep apnea - Primary    Patient not using CPAP.      Obstructive chronic bronchitis without exacerbation ([place])    An individualize plan was formulated for care of COPD.  Treatment is evidence based.  She will continue on inhalers, avoid smoking and smoke.  Regular exercise with help with dyspnea. Routine follow ups and medication compliance is needed. He has moderate obstruction on PFT today.  Start albuterol HFA.      Relevant Medications   albuterol (PROVENTIL) (2.5 MG/3ML) 0.083% nebulizer solution     Digestive   GERD (gastroesophageal reflux disease)    Plan of care was formulated today.  he is doing fair.  A plan of care was formulated using patient exam, tests and other sources to optimize care using evidence based information.  Recommend no smoking, no eating after supper, avoid fatty foods, elevate Head of bed, avoid tight fitting clothing.  Start on omeprazole 40mg  qhs and raise HOB, avoid snacks before bed.  I feel he has laryngiotracheal reflux problems causing worsening cough.  Relevant Medications   omeprazole (PRILOSEC) 40 MG capsule     Other   Low back pain    Patient has low back pain recurrent problem after helping son move.  We discussed muslc relaxer and low back exercises.      Relevant Medications   cyclobenzaprine (FLEXERIL) 10 MG tablet   Breast mass in male    Patient noted right breast mass for last few months.  It is tender.  He has no history of trauma.  We will get mammogram      Relevant Orders   MM Digital Diagnostic Unilat R    Other Visit Diagnoses    Chronic cough       Relevant Orders   Pulmonary Function Test (Completed)   Mild chronic obstructive pulmonary disease ([facility])       Relevant Medications   albuterol (PROVENTIL) (2.5 MG/3ML) 0.083% nebulizer solution   Sprain of low back, initial encounter       Relevant Medications   cyclobenzaprine (FLEXERIL) 10 MG tablet       Meds ordered this  encounter  Medications  . omeprazole (PRILOSEC) 40 MG capsule    Sig: Take 1 capsule (40 mg total) by mouth daily.    Dispense:  30 capsule    Refill:  3  . albuterol (PROVENTIL) (2.5 MG/3ML) 0.083% nebulizer solution    Sig: Take 3 mLs (2.5 mg total) by nebulization every 6 (six) hours as needed for wheezing or shortness of breath.    Dispense:  150 mL    Refill:  1  . cyclobenzaprine (FLEXERIL) 10 MG tablet    Sig: Take 1 tablet (10 mg total) by mouth 3 (three) times daily as needed for muscle spasms.    Dispense:  30 tablet    Refill:  0     Reinaldo Meeker, MD

## 2020-02-23 NOTE — Assessment & Plan Note (Signed)
Plan of care was formulated today.  he is doing fair.  A plan of care was formulated using patient exam, tests and other sources to optimize care using evidence based information.  Recommend no smoking, no eating after supper, avoid fatty foods, elevate Head of bed, avoid tight fitting clothing.  Start on omeprazole 40mg  qhs and raise HOB, avoid snacks before bed.  I feel he has laryngiotracheal reflux problems causing worsening cough.

## 2020-02-23 NOTE — Patient Instructions (Addendum)
Low Back Sprain or Strain Rehab Ask your health care provider which exercises are safe for you. Do exercises exactly as told by your health care provider and adjust them as directed. It is normal to feel mild stretching, pulling, tightness, or discomfort as you do these exercises. Stop right away if you feel sudden pain or your pain gets worse. Do not begin these exercises until told by your health care provider. Stretching and range-of-motion exercises These exercises warm up your muscles and joints and improve the movement and flexibility of your back. These exercises also help to relieve pain, numbness, and tingling. Lumbar rotation  1. Lie on your back on a firm surface and bend your knees. 2. Straighten your arms out to your sides so each arm forms a 90-degree angle (right angle) with a side of your body. 3. Slowly move (rotate) both of your knees to one side of your body until you feel a stretch in your lower back (lumbar). Try not to let your shoulders lift off the floor. 4. Hold this position for __________ seconds. 5. Tense your abdominal muscles and slowly move your knees back to the starting position. 6. Repeat this exercise on the other side of your body. Repeat __________ times. Complete this exercise __________ times a day. Single knee to chest  1. Lie on your back on a firm surface with both legs straight. 2. Bend one of your knees. Use your hands to move your knee up toward your chest until you feel a gentle stretch in your lower back and buttock. ? Hold your leg in this position by holding on to the front of your knee. ? Keep your other leg as straight as possible. 3. Hold this position for __________ seconds. 4. Slowly return to the starting position. 5. Repeat with your other leg. Repeat __________ times. Complete this exercise __________ times a day. Prone extension on elbows  1. Lie on your abdomen on a firm surface (prone position). 2. Prop yourself up on your  elbows. 3. Use your arms to help lift your chest up until you feel a gentle stretch in your abdomen and your lower back. ? This will place some of your body weight on your elbows. If this is uncomfortable, try stacking pillows under your chest. ? Your hips should stay down, against the surface that you are lying on. Keep your hip and back muscles relaxed. 4. Hold this position for __________ seconds. 5. Slowly relax your upper body and return to the starting position. Repeat __________ times. Complete this exercise __________ times a day. Strengthening exercises These exercises build strength and endurance in your back. Endurance is the ability to use your muscles for a long time, even after they get tired. Pelvic tilt This exercise strengthens the muscles that lie deep in the abdomen. 1. Lie on your back on a firm surface. Bend your knees and keep your feet flat on the floor. 2. Tense your abdominal muscles. Tip your pelvis up toward the ceiling and flatten your lower back into the floor. ? To help with this exercise, you may place a small towel under your lower back and try to push your back into the towel. 3. Hold this position for __________ seconds. 4. Let your muscles relax completely before you repeat this exercise. Repeat __________ times. Complete this exercise __________ times a day. Alternating arm and leg raises  1. Get on your hands and knees on a firm surface. If you are on a hard floor, you   may want to use padding, such as an exercise mat, to cushion your knees. 2. Line up your arms and legs. Your hands should be directly below your shoulders, and your knees should be directly below your hips. 3. Lift your left leg behind you. At the same time, raise your right arm and straighten it in front of you. ? Do not lift your leg higher than your hip. ? Do not lift your arm higher than your shoulder. ? Keep your abdominal and back muscles tight. ? Keep your hips facing the  ground. ? Do not arch your back. ? Keep your balance carefully, and do not hold your breath. 4. Hold this position for __________ seconds. 5. Slowly return to the starting position. 6. Repeat with your right leg and your left arm. Repeat __________ times. Complete this exercise __________ times a day. Abdominal set with straight leg raise  1. Lie on your back on a firm surface. 2. Bend one of your knees and keep your other leg straight. 3. Tense your abdominal muscles and lift your straight leg up, 4-6 inches (10-15 cm) off the ground. 4. Keep your abdominal muscles tight and hold this position for __________ seconds. ? Do not hold your breath. ? Do not arch your back. Keep it flat against the ground. 5. Keep your abdominal muscles tense as you slowly lower your leg back to the starting position. 6. Repeat with your other leg. Repeat __________ times. Complete this exercise __________ times a day. Single leg lower with bent knees 1. Lie on your back on a firm surface. 2. Tense your abdominal muscles and lift your feet off the floor, one foot at a time, so your knees and hips are bent in 90-degree angles (right angles). ? Your knees should be over your hips and your lower legs should be parallel to the floor. 3. Keeping your abdominal muscles tense and your knee bent, slowly lower one of your legs so your toe touches the ground. 4. Lift your leg back up to return to the starting position. ? Do not hold your breath. ? Do not let your back arch. Keep your back flat against the ground. 5. Repeat with your other leg. Repeat __________ times. Complete this exercise __________ times a day. Posture and body mechanics Good posture and healthy body mechanics can help to relieve stress in your body's tissues and joints. Body mechanics refers to the movements and positions of your body while you do your daily activities. Posture is part of body mechanics. Good posture means:  Your spine is in its  natural S-curve position (neutral).  Your shoulders are pulled back slightly.  Your head is not tipped forward. Follow these guidelines to improve your posture and body mechanics in your everyday activities. Standing   When standing, keep your spine neutral and your feet about hip width apart. Keep a slight bend in your knees. Your ears, shoulders, and hips should line up.  When you do a task in which you stand in one place for a long time, place one foot up on a stable object that is 2-4 inches (5-10 cm) high, such as a footstool. This helps keep your spine neutral. Sitting   When sitting, keep your spine neutral and keep your feet flat on the floor. Use a footrest, if necessary, and keep your thighs parallel to the floor. Avoid rounding your shoulders, and avoid tilting your head forward.  When working at a desk or a computer, keep your desk   at a height where your hands are slightly lower than your elbows. Slide your chair under your desk so you are close enough to maintain good posture.  When working at a computer, place your monitor at a height where you are looking straight ahead and you do not have to tilt your head forward or downward to look at the screen. Resting  When lying down and resting, avoid positions that are most painful for you.  If you have pain with activities such as sitting, bending, stooping, or squatting, lie in a position in which your body does not bend very much. For example, avoid curling up on your side with your arms and knees near your chest (fetal position).  If you have pain with activities such as standing for a long time or reaching with your arms, lie with your spine in a neutral position and bend your knees slightly. Try the following positions: ? Lying on your side with a pillow between your knees. ? Lying on your back with a pillow under your knees. Lifting   When lifting objects, keep your feet at least shoulder width apart and tighten your  abdominal muscles.  Bend your knees and hips and keep your spine neutral. It is important to lift using the strength of your legs, not your back. Do not lock your knees straight out.  Always ask for help to lift heavy or awkward objects. This information is not intended to replace advice given to you by your health care provider. Make sure you discuss any questions you have with your health care provider. Document Revised: 01/16/2019 Document Reviewed: 10/16/2018 Elsevier Patient Education  [place]. Gastroesophageal Reflux Disease, Adult Gastroesophageal reflux (GER) happens when acid from the stomach flows up into the tube that connects the mouth and the stomach (esophagus). Normally, food travels down the esophagus and stays in the stomach to be digested. With GER, food and stomach acid sometimes move back up into the esophagus. You may have a disease called gastroesophageal reflux disease (GERD) if the reflux:  Happens often.  Causes frequent or very bad symptoms.  Causes problems such as damage to the esophagus. When this happens, the esophagus becomes sore and swollen (inflamed). Over time, GERD can make small holes (ulcers) in the lining of the esophagus. What are the causes? This condition is caused by a problem with the muscle between the esophagus and the stomach. When this muscle is weak or not normal, it does not close properly to keep food and acid from coming back up from the stomach. The muscle can be weak because of:  Tobacco use.  Pregnancy.  Having a certain type of hernia (hiatal hernia).  Alcohol use.  Certain foods and drinks, such as coffee, chocolate, onions, and peppermint. What increases the risk? You are more likely to develop this condition if you:  Are overweight.  Have a disease that affects your connective tissue.  Use NSAID medicines. What are the signs or symptoms? Symptoms of this condition include:  Heartburn.  Difficult or painful  swallowing.  The feeling of having a lump in the throat.  A bitter taste in the mouth.  Bad breath.  Having a lot of saliva.  Having an upset or bloated stomach.  Belching.  Chest pain. Different conditions can cause chest pain. Make sure you see your doctor if you have chest pain.  Shortness of breath or noisy breathing (wheezing).  Ongoing (chronic) cough or a cough at night.  Wearing away of  the surface of teeth (tooth enamel).  Weight loss. How is this treated? Treatment will depend on how bad your symptoms are. Your doctor may suggest:  Changes to your diet.  Medicine.  Surgery. Follow these instructions at home: Eating and drinking   Follow a diet as told by your doctor. You may need to avoid foods and drinks such as: ? Coffee and tea (with or without caffeine). ? Drinks that contain alcohol. ? Energy drinks and sports drinks. ? Bubbly (carbonated) drinks or sodas. ? Chocolate and cocoa. ? Peppermint and mint flavorings. ? Garlic and onions. ? Horseradish. ? Spicy and acidic foods. These include peppers, chili powder, curry powder, vinegar, hot sauces, and BBQ sauce. ? Citrus fruit juices and citrus fruits, such as oranges, lemons, and limes. ? Tomato-based foods. These include red sauce, chili, salsa, and pizza with red sauce. ? Fried and fatty foods. These include donuts, french fries, potato chips, and high-fat dressings. ? High-fat meats. These include hot dogs, rib eye steak, sausage, ham, and bacon. ? High-fat dairy items, such as whole milk, butter, and cream cheese.  Eat small meals often. Avoid eating large meals.  Avoid drinking large amounts of liquid with your meals.  Avoid eating meals during the 2-3 hours before bedtime.  Avoid lying down right after you eat.  Do not exercise right after you eat. Lifestyle   Do not use any products that contain nicotine or tobacco. These include cigarettes, e-cigarettes, and chewing tobacco. If you  need help quitting, ask your doctor.  Try to lower your stress. If you need help doing this, ask your doctor.  If you are overweight, lose an amount of weight that is healthy for you. Ask your doctor about a safe weight loss goal. General instructions  Pay attention to any changes in your symptoms.  Take over-the-counter and prescription medicines only as told by your doctor. Do not take aspirin, ibuprofen, or other NSAIDs unless your doctor says it is okay.  Wear loose clothes. Do not wear anything tight around your waist.  Raise (elevate) the head of your bed about 6 inches (15 cm).  Avoid bending over if this makes your symptoms worse.  Keep all follow-up visits as told by your doctor. This is important. Contact a doctor if:  You have new symptoms.  You lose weight and you do not know why.  You have trouble swallowing or it hurts to swallow.  You have wheezing or a cough that keeps happening.  Your symptoms do not get better with treatment.  You have a hoarse voice. Get help right away if:  You have pain in your arms, neck, jaw, teeth, or back.  You feel sweaty, dizzy, or light-headed.  You have chest pain or shortness of breath.  You throw up (vomit) and your throw-up looks like blood or coffee grounds.  You pass out (faint).  Your poop (stool) is bloody or black.  You cannot swallow, drink, or eat. Summary  If a person has gastroesophageal reflux disease (GERD), food and stomach acid move back up into the esophagus and cause symptoms or problems such as damage to the esophagus.  Treatment will depend on how bad your symptoms are.  Follow a diet as told by your doctor.  Take all medicines only as told by your doctor. This information is not intended to replace advice given to you by your health care provider. Make sure you discuss any questions you have with your health care provider. Document Revised:  04/02/2018 Document Reviewed: 04/02/2018 Elsevier  Patient Education  [place].

## 2020-02-23 NOTE — Assessment & Plan Note (Signed)
Patient noted right breast mass for last few months.  It is tender.  He has no history of trauma.  We will get mammogram

## 2020-02-23 NOTE — Assessment & Plan Note (Signed)
An individualize plan was formulated for care of COPD.  Treatment is evidence based.  She will continue on inhalers, avoid smoking and smoke.  Regular exercise with help with dyspnea. Routine follow ups and medication compliance is needed. He has moderate obstruction on PFT today.  Start albuterol HFA.

## 2020-02-24 ENCOUNTER — Other Ambulatory Visit: Payer: Self-pay | Admitting: Legal Medicine

## 2020-02-24 ENCOUNTER — Telehealth: Payer: Self-pay

## 2020-02-24 DIAGNOSIS — I10 Essential (primary) hypertension: Secondary | ICD-10-CM

## 2020-02-24 DIAGNOSIS — J449 Chronic obstructive pulmonary disease, unspecified: Secondary | ICD-10-CM

## 2020-02-24 DIAGNOSIS — J4489 Other specified chronic obstructive pulmonary disease: Secondary | ICD-10-CM

## 2020-02-24 MED ORDER — ALBUTEROL SULFATE HFA 108 (90 BASE) MCG/ACT IN AERS: 2.0000 | INHALATION_SPRAY | Freq: Four times a day (QID) | RESPIRATORY_TRACT | 5 refills | Status: DC | PRN

## 2020-02-24 MED ORDER — VALSARTAN 320 MG PO TABS: 320.0000 mg | ORAL_TABLET | Freq: Every day | ORAL | 2 refills | Status: DC

## 2020-02-24 MED ORDER — HYDROCHLOROTHIAZIDE 25 MG PO TABS: 25.0000 mg | ORAL_TABLET | Freq: Every day | ORAL | 2 refills | Status: DC

## 2020-02-24 MED ORDER — AMLODIPINE BESYLATE 10 MG PO TABS: 10.0000 mg | ORAL_TABLET | Freq: Every day | ORAL | 2 refills | Status: DC

## 2020-02-24 NOTE — Telephone Encounter (Signed)
Patient called and stated that he went to pick up his prescriptions and he had albuterol nebules for nebulizer and he don't have a nebulizer? So he didn't know if it was supposed to be a inhailer? He also said that yall had discussed changing his BP medication because the combo amlodipine/hctz is currently what he was taking and they no longer make that? So he stated that he didn't have a new RX at the pharmacy for his BP and was wanting you to send one in. Patient uses [facility] [address] [facility]. [place]

## 2020-02-24 NOTE — Telephone Encounter (Signed)
HCTZm vasartan and amlodipine all called in Albuterol changed to HFA lp

## 2020-02-24 NOTE — Telephone Encounter (Signed)
Patient was informed.

## 2020-03-01 DIAGNOSIS — E118 Type 2 diabetes mellitus with unspecified complications: Secondary | ICD-10-CM | POA: Diagnosis not present

## 2020-03-04 DIAGNOSIS — N6311 Unspecified lump in the right breast, upper outer quadrant: Secondary | ICD-10-CM | POA: Diagnosis not present

## 2020-03-04 DIAGNOSIS — R922 Inconclusive mammogram: Secondary | ICD-10-CM | POA: Diagnosis not present

## 2020-03-04 DIAGNOSIS — N644 Mastodynia: Secondary | ICD-10-CM | POA: Diagnosis not present

## 2020-03-08 ENCOUNTER — Ambulatory Visit: Payer: Medicare HMO | Admitting: Legal Medicine

## 2020-03-10 ENCOUNTER — Encounter: Payer: Self-pay | Admitting: Legal Medicine

## 2020-03-10 ENCOUNTER — Other Ambulatory Visit: Payer: Self-pay

## 2020-03-10 ENCOUNTER — Ambulatory Visit (INDEPENDENT_AMBULATORY_CARE_PROVIDER_SITE_OTHER): Payer: Medicare HMO | Admitting: Legal Medicine

## 2020-03-10 VITALS — BP 130/85 | HR 87 | Temp 97.3°F | Ht 72.0 in | Wt 208.8 lb

## 2020-03-10 DIAGNOSIS — Z6828 Body mass index (BMI) 28.0-28.9, adult: Secondary | ICD-10-CM

## 2020-03-10 DIAGNOSIS — K2100 Gastro-esophageal reflux disease with esophagitis, without bleeding: Secondary | ICD-10-CM

## 2020-03-10 DIAGNOSIS — S335XXA Sprain of ligaments of lumbar spine, initial encounter: Secondary | ICD-10-CM

## 2020-03-10 MED ORDER — CYCLOBENZAPRINE HCL 10 MG PO TABS: 10.0000 mg | ORAL_TABLET | Freq: Three times a day (TID) | ORAL | 2 refills | Status: DC | PRN

## 2020-03-10 NOTE — Progress Notes (Signed)
Established Patient Office Visit  Subjective:  Patient ID: Mark Stewart, male    DOB: Apr 05, 1946  Age: 74 y.o. MRN: [id number]  CC:  Chief Complaint  Patient presents with  . Gastroesophageal Reflux    HPI Mark Stewart presents for reflux improved  Patient has gastroesophageal reflux symptoms withesophagitis and LTRD.  The symptoms are mid intensity.  Length of symptoms 6 years.  Medicines include omeprazole.  Complications include none  Cough improved-.on albuterol. Diabetes- sees dr. Posey Pronto. Last A1c 9.4  Patient has back pain that is slowly improving.  Renew flexeril  Past Medical History:  Diagnosis Date  . Allergy   . Cancer ([place])    skin, hand  . Cataract    history of surgery bilaterally  . Diabetes mellitus without complication ([place])   . Hypercholesteremia   . Hypertension   . 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 Stroke [facility]) 02/2016    Past Surgical History:  Procedure Laterality Date  . EYE SURGERY    . HERNIA REPAIR  02/28/2018  . PROSTATE SURGERY    . SPLENECTOMY      Family History  Problem Relation Age of Onset  . Breast cancer Mother   . Diabetes Mellitus II Father   . Breast cancer Sister     Social History   Socioeconomic History  . Marital status: Married    Spouse name: Mark Stewart  . Number of children: Not on file  . Years of education: Not on file  . Highest education level: Not on file  Occupational History  . Not on file  Tobacco Use  . Smoking status: Former Smoker    Packs/day: 1.00    Years: 20.00    Pack years: 20.00  . Smokeless tobacco: Never Used  . Tobacco comment: patient states last cigarette 35 years ago  Substance and Sexual Activity  . Alcohol use: No  . Drug use: No  . Sexual activity: Not on file  Other Topics Concern  . Not on file  Social History Narrative  . Not on file   Social Determinants of Health   Financial Resource Strain:   . Difficulty of Paying Living  Expenses:   Food Insecurity:   . Worried About Charity fundraiser in the Last Year:   . Arboriculturist in the Last Year:   Transportation Needs:   . Film/video editor (Medical):   Marland Kitchen Lack of Transportation (Non-Medical):   Physical Activity:   . Days of Exercise per Week:   . Minutes of Exercise per Session:   Stress:   . Feeling of Stress :   Social Connections:   . Frequency of Communication with Friends and Family:   . Frequency of Social Gatherings with Friends and Family:   . Attends Religious Services:   . Active Member of Clubs or Organizations:   . Attends Archivist Meetings:   Marland Kitchen Marital Status:   Intimate Partner Violence:   . Fear of Current or Ex-Partner:   . Emotionally Abused:   Marland Kitchen Physically Abused:   . Sexually Abused:     Outpatient Medications Prior to Visit  Medication Sig Dispense Refill  . albuterol (VENTOLIN HFA) 108 (90 Base) MCG/ACT inhaler Inhale 2 puffs into the lungs every 6 (six) hours as needed. 18 g 5  . amLODipine (NORVASC) 10 MG tablet Take 1 tablet (10 mg total) by mouth daily. 90 tablet 2  .  aspirin EC 81 MG tablet Take 81 mg by mouth daily.    Marland Kitchen atorvastatin (LIPITOR) 80 MG tablet Take 1 tablet (80 mg total) by mouth daily. 30 tablet 6  . BAQSIMI TWO PACK 3 MG/DOSE POWD     . buPROPion (WELLBUTRIN XL) 150 MG 24 hr tablet Take 150 mg by mouth daily.    . carvedilol (COREG) 25 MG tablet Take 2 tablets (50 mg total) by mouth 2 (two) times daily. Call for appointment 360 tablet 1  . cloNIDine (CATAPRES - DOSED IN MG/24 HR) 0.2 mg/24hr patch PLACE 1 PATCH (0.2 MG TOTAL) ONTO THE SKIN ONCE A WEEK. 12 patch 3  . cyclobenzaprine (FLEXERIL) 10 MG tablet Take 1 tablet (10 mg total) by mouth 3 (three) times daily as needed for muscle spasms. 30 tablet 0  . empagliflozin (JARDIANCE) 25 MG TABS tablet Take 25 mg by mouth daily.     . fluticasone (FLONASE) 50 MCG/ACT nasal spray Place 2 sprays into both nostrils daily as needed for allergies  or rhinitis. Reported on 04/26/2016    . hydrochlorothiazide (HYDRODIURIL) 25 MG tablet Take 1 tablet (25 mg total) by mouth daily. 90 tablet 2  . insulin aspart (NOVOLOG) 100 UNIT/ML injection Inject 40 Units into the skin daily. Uses V-Go 30 units per day sq infustion     . Insulin Disposable Pump (OMNIPOD DASH 5 PACK PODS) MISC     . minoxidil (LONITEN) 10 MG tablet TAKE 1 TABLET BY MOUTH EVERY DAY 90 tablet 1  . omeprazole (PRILOSEC) 40 MG capsule Take 1 capsule (40 mg total) by mouth daily. 30 capsule 3  . spironolactone (ALDACTONE) 50 MG tablet     . valsartan (DIOVAN) 320 MG tablet Take 1 tablet (320 mg total) by mouth daily. 90 tablet 2   No facility-administered medications prior to visit.    Allergies  Allergen Reactions  . Benazepril Cough  . Metformin And Related Rash  . Penicillins Rash    Has patient had a PCN reaction causing immediate rash, facial/tongue/throat swelling, SOB or lightheadedness with hypotension: yes Has patient had a PCN reaction causing severe rash involving mucus membranes or skin necrosis: no Has patient had a PCN reaction that required hospitalization: no Has patient had a PCN reaction occurring within the last 10 years: no If all of the above answers are "NO", then may proceed with Cephalosporin use.     ROS Review of Systems  Constitutional: Negative.   HENT: Negative.   Eyes: Negative.   Cardiovascular: Negative.   Gastrointestinal: Negative.   Musculoskeletal: Positive for back pain.      Objective:    Physical Exam  BP (!) 164/82   Pulse 87   Temp (!) 97.3 F (36.3 C)   Ht 6' (1.829 m)   Wt 208 lb 12.8 oz (94.7 kg)   SpO2 98%   BMI 28.32 kg/m  Wt Readings from Last 3 Encounters:  03/10/20 208 lb 12.8 oz (94.7 kg)  02/23/20 214 lb 12.8 oz (97.4 kg)  01/20/20 214 lb (97.1 kg)     Health Maintenance Due  Topic Date Due  . Hepatitis C Screening  Never done  . FOOT EXAM  Never done  . OPHTHALMOLOGY EXAM  Never done  .  COVID-19 Vaccine (1) Never done  . TETANUS/TDAP  Never done  . COLONOSCOPY  Never done  . PNA vac Low Risk Adult (1 of 2 - PCV13) Never done  . HEMOGLOBIN A1C  08/29/2016  There are no preventive care reminders to display for this patient.  No results found for: TSH Lab Results  Component Value Date   WBC 6.6 02/27/2016   HGB 14.2 02/27/2016   HCT 42.9 02/27/2016   MCV 91.9 02/27/2016   PLT 248 02/27/2016   Lab Results  Component Value Date   NA 137 09/09/2019   K 4.4 09/09/2019   CO2 23 09/09/2019   GLUCOSE 231 (H) 09/09/2019   BUN 22 09/09/2019   CREATININE 0.92 09/09/2019   BILITOT 0.8 02/27/2016   ALKPHOS 72 02/27/2016   AST 14 (L) 02/27/2016   ALT 16 (L) 02/27/2016   PROT 6.4 (L) 02/27/2016   ALBUMIN 3.1 (L) 02/27/2016   CALCIUM 9.3 09/09/2019   ANIONGAP 6 02/27/2016   Lab Results  Component Value Date   CHOL 121 02/27/2016   Lab Results  Component Value Date   HDL 34 (L) 02/27/2016   Lab Results  Component Value Date   LDLCALC 71 02/27/2016   Lab Results  Component Value Date   TRIG 82 02/27/2016   Lab Results  Component Value Date   CHOLHDL 3.6 02/27/2016   Lab Results  Component Value Date   HGBA1C 10.7 (H) 02/27/2016      Assessment & Plan:   Problem List Items Addressed This Visit      Digestive   GERD (gastroesophageal reflux disease) - Primary    Plan of care was formulated today.  She is doing well.  A plan of care was formulated using patient exam, tests and other sources to optimize care using evidence based information.  Recommend no smoking, no eating after supper, avoid fatty foods, elevate Head of bed, avoid tight fitting clothing.  Continue on omeprazole.  He continues to have low back pain but improving.  An individual care plan for diabetes was established and reinforced today.  The patient's status was assessed using clinical findings on exam, labs and diagnostic testing. Patient success at meeting goals based on disease  specific evidence-based guidelines and found to be poor controlled.last A1c was 9.4 on insulin pump and jardiance. Medications were assessed and patient's understanding of the medical issues , including barriers were assessed. Recommend adherence to a diabetic diet, a graduated exercise program, HgbA1c level is checked quarterly, and urine microalbumin performed yearly .  Annual mono-filament sensation testing performed. Lower blood pressure and control hyperlipidemia is important. Get annual eye exams and annual flu shots and smoking cessation discussed.  Self management goals were discussed.. possibly GLP1 would help.    Follow-up: Return in about 6 months (around 09/09/2020) for fasting.    Reinaldo Meeker, MD

## 2020-03-16 ENCOUNTER — Other Ambulatory Visit: Payer: Self-pay | Admitting: Cardiovascular Disease

## 2020-03-17 ENCOUNTER — Telehealth: Payer: Self-pay

## 2020-03-17 NOTE — Telephone Encounter (Signed)
Voicemail was full.

## 2020-03-17 NOTE — Telephone Encounter (Signed)
I called patient, but his voicemail is not set up yet.

## 2020-03-25 ENCOUNTER — Other Ambulatory Visit: Payer: Self-pay | Admitting: Cardiovascular Disease

## 2020-03-28 ENCOUNTER — Other Ambulatory Visit: Payer: Self-pay

## 2020-03-28 ENCOUNTER — Ambulatory Visit (INDEPENDENT_AMBULATORY_CARE_PROVIDER_SITE_OTHER): Payer: Medicare HMO | Admitting: Legal Medicine

## 2020-03-28 ENCOUNTER — Encounter: Payer: Self-pay | Admitting: Legal Medicine

## 2020-03-28 DIAGNOSIS — S335XXA Sprain of ligaments of lumbar spine, initial encounter: Secondary | ICD-10-CM | POA: Diagnosis not present

## 2020-03-28 MED ORDER — CYCLOBENZAPRINE HCL 10 MG PO TABS: 10.0000 mg | ORAL_TABLET | Freq: Three times a day (TID) | ORAL | 2 refills | Status: DC | PRN

## 2020-03-28 NOTE — Progress Notes (Signed)
Acute Office Visit  Subjective:    Patient ID: Mark Stewart, male    DOB: [birth year], 74 y.o.   MRN: [id number]  Chief Complaint  Patient presents with  . Leg Pain    Right thigh    HPI Patient is in today for pain right leg for one week.  No injury. Pain walking and sitting still. He has history of back pain that has resolved.  Past Medical History:  Diagnosis Date  . Allergy   . Cancer ([place])    skin, hand  . Cataract    history of surgery bilaterally  . Hypercholesteremia   . Hypertension   . 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 Stroke [facility]) 02/2016    Past Surgical History:  Procedure Laterality Date  . EYE SURGERY    . HERNIA REPAIR  02/28/2018  . PROSTATE SURGERY    . SPLENECTOMY      Family History  Problem Relation Age of Onset  . Breast cancer Mother   . Diabetes Mellitus II Father   . Breast cancer Sister     Social History   Socioeconomic History  . Marital status: Married    Spouse name: graye  . Number of children: Not on file  . Years of education: Not on file  . Highest education level: Not on file  Occupational History  . Not on file  Tobacco Use  . Smoking status: Former Smoker    Packs/day: 1.00    Years: 20.00    Pack years: 20.00  . Smokeless tobacco: Never Used  . Tobacco comment: patient states last cigarette 35 years ago  Substance and Sexual Activity  . Alcohol use: No  . Drug use: No  . Sexual activity: Not on file  Other Topics Concern  . Not on file  Social History Narrative  . Not on file   Social Determinants of Health   Financial Resource Strain:   . Difficulty of Paying Living Expenses:   Food Insecurity:   . Worried About Charity fundraiser in the Last Year:   . Arboriculturist in the Last Year:   Transportation Needs:   . Film/video editor (Medical):   Marland Kitchen Lack of Transportation (Non-Medical):   Physical Activity:   . Days of Exercise per Week:   . Minutes of  Exercise per Session:   Stress:   . Feeling of Stress :   Social Connections:   . Frequency of Communication with Friends and Family:   . Frequency of Social Gatherings with Friends and Family:   . Attends Religious Services:   . Active Member of Clubs or Organizations:   . Attends Archivist Meetings:   Marland Kitchen Marital Status:   Intimate Partner Violence:   . Fear of Current or Ex-Partner:   . Emotionally Abused:   Marland Kitchen Physically Abused:   . Sexually Abused:     Outpatient Medications Prior to Visit  Medication Sig Dispense Refill  . albuterol (VENTOLIN HFA) 108 (90 Base) MCG/ACT inhaler Inhale 2 puffs into the lungs every 6 (six) hours as needed. 18 g 5  . amLODipine (NORVASC) 10 MG tablet Take 1 tablet (10 mg total) by mouth daily. 90 tablet 2  . aspirin EC 81 MG tablet Take 81 mg by mouth daily.    Marland Kitchen atorvastatin (LIPITOR) 80 MG tablet Take 1 tablet (80 mg total) by mouth daily. 30 tablet 6  .  BAQSIMI TWO PACK 3 MG/DOSE POWD     . buPROPion (WELLBUTRIN XL) 150 MG 24 hr tablet Take 150 mg by mouth daily.    . carvedilol (COREG) 25 MG tablet TAKE 2 TABLETS (50 MG TOTAL) BY MOUTH 2 (TWO) TIMES DAILY. CALL FOR APPOINTMENT 360 tablet 1  . cloNIDine (CATAPRES - DOSED IN MG/24 HR) 0.2 mg/24hr patch PLACE 1 PATCH (0.2 MG TOTAL) ONTO THE SKIN ONCE A WEEK. 12 patch 3  . empagliflozin (JARDIANCE) 25 MG TABS tablet Take 25 mg by mouth daily.     . fluticasone (FLONASE) 50 MCG/ACT nasal spray Place 2 sprays into both nostrils daily as needed for allergies or rhinitis. Reported on 04/26/2016    . hydrochlorothiazide (HYDRODIURIL) 25 MG tablet Take 1 tablet (25 mg total) by mouth daily. 90 tablet 2  . insulin aspart (NOVOLOG) 100 UNIT/ML injection Inject 40 Units into the skin daily. Uses V-Go 30 units per day sq infustion     . Insulin Disposable Pump (OMNIPOD DASH 5 PACK PODS) MISC     . minoxidil (LONITEN) 10 MG tablet TAKE 1/2 TABLET BY MOUTH EVERY DAY 45 tablet 2  . omeprazole (PRILOSEC)  40 MG capsule Take 1 capsule (40 mg total) by mouth daily. 30 capsule 3  . spironolactone (ALDACTONE) 50 MG tablet     . valsartan (DIOVAN) 320 MG tablet Take 1 tablet (320 mg total) by mouth daily. 90 tablet 2  . cyclobenzaprine (FLEXERIL) 10 MG tablet Take 1 tablet (10 mg total) by mouth 3 (three) times daily as needed for muscle spasms. 30 tablet 2   No facility-administered medications prior to visit.    Allergies  Allergen Reactions  . Benazepril Cough  . Metformin And Related Rash  . Penicillins Rash    Has patient had a PCN reaction causing immediate rash, facial/tongue/throat swelling, SOB or lightheadedness with hypotension: yes Has patient had a PCN reaction causing severe rash involving mucus membranes or skin necrosis: no Has patient had a PCN reaction that required hospitalization: no Has patient had a PCN reaction occurring within the last 10 years: no If all of the above answers are "NO", then may proceed with Cephalosporin use.     Review of Systems  Constitutional: Negative.   HENT: Negative.   Eyes: Negative.   Respiratory: Negative.   Cardiovascular: Negative.   Gastrointestinal: Negative.   Genitourinary: Negative.   Musculoskeletal: Positive for back pain.  Psychiatric/Behavioral: Negative.        Objective:    Physical Exam Vitals reviewed.  Constitutional:      Appearance: Normal appearance.  Cardiovascular:     Rate and Rhythm: Normal rate and regular rhythm.     Pulses: Normal pulses.     Heart sounds: Normal heart sounds.  Pulmonary:     Effort: Pulmonary effort is normal.     Breath sounds: Normal breath sounds.  Abdominal:     General: Abdomen is flat. Bowel sounds are normal.     Palpations: Abdomen is soft.  Musculoskeletal:     Comments: Pain right thigh region, no pain rom hip or back  Neurological:     General: No focal deficit present.     Mental Status: He is alert and oriented to person, place, and time. Mental status is at  baseline.     BP 140/90   Pulse 92   Temp 97.8 F (36.6 C)   Ht 6' (1.829 m)   Wt 210 lb (95.3 kg)   SpO2  94%   BMI 28.48 kg/m  Wt Readings from Last 3 Encounters:  03/28/20 210 lb (95.3 kg)  03/10/20 208 lb 12.8 oz (94.7 kg)  02/23/20 214 lb 12.8 oz (97.4 kg)    Health Maintenance Due  Topic Date Due  . Hepatitis C Screening  Never done  . FOOT EXAM  Never done  . OPHTHALMOLOGY EXAM  Never done  . COVID-19 Vaccine (1) Never done  . TETANUS/TDAP  Never done  . COLONOSCOPY  Never done  . PNA vac Low Risk Adult (2 of 2 - PPSV23) 07/31/2016    There are no preventive care reminders to display for this patient.   No results found for: TSH Lab Results  Component Value Date   WBC 6.6 02/27/2016   HGB 14.2 02/27/2016   HCT 42.9 02/27/2016   MCV 91.9 02/27/2016   PLT 248 02/27/2016   Lab Results  Component Value Date   NA 137 09/09/2019   K 4.4 09/09/2019   CO2 23 09/09/2019   GLUCOSE 231 (H) 09/09/2019   BUN 22 09/09/2019   CREATININE 0.92 09/09/2019   BILITOT 0.8 02/27/2016   ALKPHOS 72 02/27/2016   AST 14 (L) 02/27/2016   ALT 16 (L) 02/27/2016   PROT 6.4 (L) 02/27/2016   ALBUMIN 3.1 (L) 02/27/2016   CALCIUM 9.3 09/09/2019   ANIONGAP 6 02/27/2016   Lab Results  Component Value Date   CHOL 121 02/27/2016   Lab Results  Component Value Date   HDL 34 (L) 02/27/2016   Lab Results  Component Value Date   LDLCALC 71 02/27/2016   Lab Results  Component Value Date   TRIG 82 02/27/2016   Lab Results  Component Value Date   CHOLHDL 3.6 02/27/2016   Lab Results  Component Value Date   HGBA1C 10.7 (H) 02/27/2016       Assessment & Plan:  1. Sprain of low back, initial encounter - cyclobenzaprine (FLEXERIL) 10 MG tablet; Take 1 tablet (10 mg total) by mouth 3 (three) times daily as needed for muscle spasms.  Dispense: 30 tablet; Refill: 2  Patient is having pain probably from lumbar spine.  No hernias or inguinal ligament pain.  Meds ordered this  encounter  Medications  . cyclobenzaprine (FLEXERIL) 10 MG tablet    Sig: Take 1 tablet (10 mg total) by mouth 3 (three) times daily as needed for muscle spasms.    Dispense:  30 tablet    Refill:  2    No orders of the defined types were placed in this encounter.    Follow-up: Return if symptoms worsen or fail to improve.  An After Visit Summary was printed and given to the patient.  [place] [phone]

## 2020-04-05 ENCOUNTER — Other Ambulatory Visit: Payer: Self-pay

## 2020-04-05 ENCOUNTER — Ambulatory Visit (INDEPENDENT_AMBULATORY_CARE_PROVIDER_SITE_OTHER): Payer: Medicare HMO | Admitting: Legal Medicine

## 2020-04-05 ENCOUNTER — Encounter: Payer: Self-pay | Admitting: Legal Medicine

## 2020-04-05 VITALS — BP 140/96 | HR 101 | Temp 97.3°F | Resp 16 | Ht 72.0 in | Wt 210.0 lb

## 2020-04-05 DIAGNOSIS — K4090 Unilateral inguinal hernia, without obstruction or gangrene, not specified as recurrent: Secondary | ICD-10-CM

## 2020-04-05 DIAGNOSIS — E1142 Type 2 diabetes mellitus with diabetic polyneuropathy: Secondary | ICD-10-CM

## 2020-04-05 LAB — COMPREHENSIVE METABOLIC PANEL
ALT: 18 IU/L (ref 0–44)
AST: 15 IU/L (ref 0–40)
Albumin/Globulin Ratio: 1.2 (ref 1.2–2.2)
Albumin: 3.7 g/dL (ref 3.7–4.7)
Alkaline Phosphatase: 93 IU/L (ref 48–121)
BUN/Creatinine Ratio: 17 (ref 10–24)
BUN: 18 mg/dL (ref 8–27)
Bilirubin Total: 0.3 mg/dL (ref 0.0–1.2)
CO2: 24 mmol/L (ref 20–29)
Calcium: 9.5 mg/dL (ref 8.6–10.2)
Chloride: 103 mmol/L (ref 96–106)
Creatinine, Ser: 1.09 mg/dL (ref 0.76–1.27)
GFR calc Af Amer: 77 mL/min/{1.73_m2} (ref >59)
GFR calc non Af Amer: 67 mL/min/{1.73_m2} (ref >59)
Globulin, Total: 3 g/dL (ref 1.5–4.5)
Glucose: 166 mg/dL — ABNORMAL HIGH (ref 65–99)
Potassium: 4.5 mmol/L (ref 3.5–5.2)
Sodium: 140 mmol/L (ref 134–144)
Total Protein: 6.7 g/dL (ref 6.0–8.5)

## 2020-04-05 LAB — HEMOGLOBIN A1C
Est. average glucose Bld gHb Est-mCnc: 194 mg/dL
Hgb A1c MFr Bld: 8.4 % — ABNORMAL HIGH (ref 4.8–5.6)

## 2020-04-05 NOTE — Progress Notes (Signed)
Subjective:  Patient ID: Mark Stewart, male    DOB: Nov 29, 1945  Age: 74 y.o. MRN: [id number]  Chief Complaint  Patient presents with  . Leg Pain    Right Leg    HPI: pain with walking right upper leg for 2 to 3 weeks.  Worse pain walking and better with rest.  Pain at night.  No swelling.  No nausea or vomiting.  Flexeril not helping. He had a right inguinal hernia repair in past and now has a bulge there.   Current Outpatient Medications on File Prior to Visit  Medication Sig Dispense Refill  . albuterol (VENTOLIN HFA) 108 (90 Base) MCG/ACT inhaler Inhale 2 puffs into the lungs every 6 (six) hours as needed. 18 g 5  . amLODipine (NORVASC) 10 MG tablet Take 1 tablet (10 mg total) by mouth daily. 90 tablet 2  . aspirin EC 81 MG tablet Take 81 mg by mouth daily.    Marland Kitchen atorvastatin (LIPITOR) 80 MG tablet Take 1 tablet (80 mg total) by mouth daily. 30 tablet 6  . BAQSIMI TWO PACK 3 MG/DOSE POWD     . buPROPion (WELLBUTRIN XL) 150 MG 24 hr tablet Take 150 mg by mouth daily.    . carvedilol (COREG) 25 MG tablet TAKE 2 TABLETS (50 MG TOTAL) BY MOUTH 2 (TWO) TIMES DAILY. CALL FOR APPOINTMENT 360 tablet 1  . cloNIDine (CATAPRES - DOSED IN MG/24 HR) 0.2 mg/24hr patch PLACE 1 PATCH (0.2 MG TOTAL) ONTO THE SKIN ONCE A WEEK. 12 patch 3  . cyclobenzaprine (FLEXERIL) 10 MG tablet Take 1 tablet (10 mg total) by mouth 3 (three) times daily as needed for muscle spasms. 30 tablet 2  . empagliflozin (JARDIANCE) 25 MG TABS tablet Take 25 mg by mouth daily.     . fluticasone (FLONASE) 50 MCG/ACT nasal spray Place 2 sprays into both nostrils daily as needed for allergies or rhinitis. Reported on 04/26/2016    . hydrochlorothiazide (HYDRODIURIL) 25 MG tablet Take 1 tablet (25 mg total) by mouth daily. 90 tablet 2  . insulin aspart (NOVOLOG) 100 UNIT/ML injection Inject 40 Units into the skin daily. Uses V-Go 30 units per day sq infustion     . Insulin Disposable Pump (OMNIPOD DASH 5 PACK PODS) MISC     .  minoxidil (LONITEN) 10 MG tablet TAKE 1/2 TABLET BY MOUTH EVERY DAY 45 tablet 2  . omeprazole (PRILOSEC) 40 MG capsule Take 1 capsule (40 mg total) by mouth daily. 30 capsule 3  . spironolactone (ALDACTONE) 50 MG tablet     . valsartan (DIOVAN) 320 MG tablet Take 1 tablet (320 mg total) by mouth daily. 90 tablet 2   No current facility-administered medications on file prior to visit.   Past Medical History:  Diagnosis Date  . Allergy   . Cancer ([place])    skin, hand  . Cataract    history of surgery bilaterally  . Hypercholesteremia   . Hypertension   . 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 Stroke [facility]) 02/2016   Past Surgical History:  Procedure Laterality Date  . EYE SURGERY    . HERNIA REPAIR  02/28/2018  . PROSTATE SURGERY    . SPLENECTOMY      Family History  Problem Relation Age of Onset  . Breast cancer Mother   . Diabetes Mellitus II Father   . Breast cancer Sister    Social History   Socioeconomic History  .  Marital status: Married    Spouse name: graye  . Number of children: Not on file  . Years of education: Not on file  . Highest education level: Not on file  Occupational History  . Not on file  Tobacco Use  . Smoking status: Former Smoker    Packs/day: 1.00    Years: 20.00    Pack years: 20.00  . Smokeless tobacco: Never Used  . Tobacco comment: patient states last cigarette 35 years ago  Substance and Sexual Activity  . Alcohol use: No  . Drug use: No  . Sexual activity: Not on file  Other Topics Concern  . Not on file  Social History Narrative  . Not on file   Social Determinants of Health   Financial Resource Strain:   . Difficulty of Paying Living Expenses:   Food Insecurity:   . Worried About Charity fundraiser in the Last Year:   . Arboriculturist in the Last Year:   Transportation Needs:   . Film/video editor (Medical):   Marland Kitchen Lack of Transportation (Non-Medical):   Physical Activity:   . Days of  Exercise per Week:   . Minutes of Exercise per Session:   Stress:   . Feeling of Stress :   Social Connections:   . Frequency of Communication with Friends and Family:   . Frequency of Social Gatherings with Friends and Family:   . Attends Religious Services:   . Active Member of Clubs or Organizations:   . Attends Archivist Meetings:   Marland Kitchen Marital Status:     Review of Systems  HENT: Negative.   Eyes: Negative.   Respiratory: Negative.   Gastrointestinal:       Pain in right groin  Genitourinary: Negative.   Musculoskeletal: Negative.   Neurological: Positive for numbness.       Weakness left side  Psychiatric/Behavioral: Negative.      Objective:  BP (!) 140/96   Pulse (!) 101   Temp (!) 97.3 F (36.3 C)   Resp 16   Ht 6' (1.829 m)   Wt 210 lb (95.3 kg)   SpO2 94%   BMI 28.48 kg/m   BP/Weight 04/05/2020 0/97/3532 06/16/2425  Systolic BP 834 196 222  Diastolic BP 96 90 85  Wt. (Lbs) 210 210 208.8  BMI 28.48 28.48 28.32    Physical Exam HENT:     Head: Normocephalic and atraumatic.     Right Ear: Tympanic membrane normal.     Left Ear: Tympanic membrane normal.     Nose: Nose normal.     Mouth/Throat:     Mouth: Mucous membranes are dry.  Eyes:     Extraocular Movements: Extraocular movements intact.     Conjunctiva/sclera: Conjunctivae normal.     Pupils: Pupils are equal, round, and reactive to light.  Cardiovascular:     Rate and Rhythm: Normal rate and regular rhythm.     Pulses: Normal pulses.     Heart sounds: Normal heart sounds.  Pulmonary:     Effort: Pulmonary effort is normal.     Breath sounds: Normal breath sounds.  Abdominal:     General: Abdomen is flat. Bowel sounds are normal.     Palpations: Abdomen is soft.     Tenderness: There is abdominal tenderness.     Comments: Pain and bulging in right inguinal area  Musculoskeletal:     Cervical back: Normal range of motion.  Neurological:  Mental Status: He is oriented to  person, place, and time. Mental status is at baseline.  Psychiatric:        Mood and Affect: Mood normal.        Behavior: Behavior normal.     Diabetic Foot Exam - Simple   Simple Foot Form Diabetic Foot exam was performed with the following findings: Yes 04/05/2020  9:13 AM  Visual Inspection No deformities, no ulcerations, no other skin breakdown bilaterally: Yes Sensation Testing See comments: Yes Pulse Check Posterior Tibialis and Dorsalis pulse intact bilaterally: Yes Comments No sensation to monofilament in both feet       Lab Results  Component Value Date   WBC 6.6 02/27/2016   HGB 14.2 02/27/2016   HCT 42.9 02/27/2016   PLT 248 02/27/2016   GLUCOSE 231 (H) 09/09/2019   CHOL 121 02/27/2016   TRIG 82 02/27/2016   HDL 34 (L) 02/27/2016   LDLCALC 71 02/27/2016   ALT 16 (L) 02/27/2016   AST 14 (L) 02/27/2016   NA 137 09/09/2019   K 4.4 09/09/2019   CL 101 09/09/2019   CREATININE 0.92 09/09/2019   BUN 22 09/09/2019   CO2 23 09/09/2019   HGBA1C 10.7 (H) 02/27/2016      Assessment & Plan:   1. Diabetic polyneuropathy associated with type 2 diabetes mellitus (HCC) - Nerve conduction test; Future - Comprehensive metabolic panel - Hemoglobin A1c - For Home Use Only DME Diabetic Shoe An individual care plan for diabetes was established and reinforced today.  The patient's status was assessed using clinical findings on exam, labs and diagnostic testing. Patient success at meeting goals based on disease specific evidence-based guidelines and found to be good controlled. Medications were assessed and patient's understanding of the medical issues , including barriers were assessed. Recommend adherence to a diabetic diet, a graduated exercise program, HgbA1c level is checked quarterly, and urine microalbumin performed yearly .  Annual mono-filament sensation testing performed. Lower blood pressure and control hyperlipidemia is important. Get annual eye exams and annual  flu shots and smoking cessation discussed.  Self management goals were discussed.  2. Inguinal hernia, right - Ambulatory referral to General Surgery Appears to have recurrent right inguinal hernia, palpation causes all his pain down leg    No orders of the defined types were placed in this encounter.   Orders Placed This Encounter  Procedures  . For Home Use Only DME Diabetic Shoe  . Comprehensive metabolic panel  . Hemoglobin A1c  . Ambulatory referral to General Surgery  . Nerve conduction test     Follow-up: No follow-ups on file.  An After Visit Summary was printed and given to the patient.  [place] [phone]

## 2020-04-06 DIAGNOSIS — E118 Type 2 diabetes mellitus with unspecified complications: Secondary | ICD-10-CM | POA: Diagnosis not present

## 2020-04-06 NOTE — Progress Notes (Signed)
Glucose 166, kidney tests normal, liver tests normal, A1c 8.4 improved- increase insulin to 35 units a day lp

## 2020-04-07 DIAGNOSIS — Z794 Long term (current) use of insulin: Secondary | ICD-10-CM | POA: Diagnosis not present

## 2020-04-07 DIAGNOSIS — E118 Type 2 diabetes mellitus with unspecified complications: Secondary | ICD-10-CM | POA: Diagnosis not present

## 2020-04-15 DIAGNOSIS — K4021 Bilateral inguinal hernia, without obstruction or gangrene, recurrent: Secondary | ICD-10-CM | POA: Diagnosis not present

## 2020-04-21 DIAGNOSIS — Z794 Long term (current) use of insulin: Secondary | ICD-10-CM | POA: Diagnosis not present

## 2020-04-21 DIAGNOSIS — E118 Type 2 diabetes mellitus with unspecified complications: Secondary | ICD-10-CM | POA: Diagnosis not present

## 2020-04-22 DIAGNOSIS — Z1152 Encounter for screening for COVID-19: Secondary | ICD-10-CM | POA: Diagnosis not present

## 2020-04-22 DIAGNOSIS — Z1159 Encounter for screening for other viral diseases: Secondary | ICD-10-CM | POA: Diagnosis not present

## 2020-04-25 ENCOUNTER — Other Ambulatory Visit: Payer: Self-pay

## 2020-04-25 ENCOUNTER — Ambulatory Visit: Payer: PPO | Admitting: Podiatry

## 2020-04-25 DIAGNOSIS — M2041 Other hammer toe(s) (acquired), right foot: Secondary | ICD-10-CM

## 2020-04-25 DIAGNOSIS — M2042 Other hammer toe(s) (acquired), left foot: Secondary | ICD-10-CM | POA: Diagnosis not present

## 2020-04-25 DIAGNOSIS — M2142 Flat foot [pes planus] (acquired), left foot: Secondary | ICD-10-CM | POA: Diagnosis not present

## 2020-04-25 DIAGNOSIS — B351 Tinea unguium: Secondary | ICD-10-CM

## 2020-04-25 DIAGNOSIS — M2141 Flat foot [pes planus] (acquired), right foot: Secondary | ICD-10-CM | POA: Diagnosis not present

## 2020-04-25 DIAGNOSIS — E1169 Type 2 diabetes mellitus with other specified complication: Secondary | ICD-10-CM

## 2020-04-25 DIAGNOSIS — E1142 Type 2 diabetes mellitus with diabetic polyneuropathy: Secondary | ICD-10-CM | POA: Diagnosis not present

## 2020-04-25 NOTE — Progress Notes (Signed)
  Subjective:  Patient ID: Mark Stewart, male    DOB: [birth year],  MRN: [id number]  Chief Complaint  Patient presents with  . Diabetes    Pt interested in diabetic shoes -pt stats he's been having numnbess on foot and ankle x last few mo Tx: none   . Diabetes    FBS: 127 a1C: 8.4 CPP: [name] x 2 wks    74 y.o. male presents with the above complaint. History confirmed with patient.   Objective:  Physical Exam: warm, good capillary refill, nail exam onychomycosis of the toenails, no trophic changes or ulcerative lesions. DP pulses palpable, PT pulses palpable, protective sensation absent and vibratory sensation diminished Left Foot: normal exam, no swelling, tenderness, instability; ligaments intact, full range of motion of all ankle/foot joints Hammertoes, pes planus Right Foot: normal exam, no swelling, tenderness, instability; ligaments intact, full range of motion of all ankle/foot joints Hammertoes, pes planus  No images are attached to the encounter.  Assessment:   1. Onychomycosis of multiple toenails with type 2 diabetes mellitus and peripheral neuropathy ([place])   2. Hammer toes of both feet   3. Pes planus of both feet      Plan:  Patient was evaluated and treated and all questions answered.  Onychomycosis, Diabetes and DPN -Patient is diabetic with a qualifying condition for at risk foot care. -Would benefit from DM shoes. Will make appt for fabrication  Procedure: Nail Debridement Rationale: Patient meets criteria for routine foot care due to DPN Type of Debridement: manual, sharp debridement. Instrumentation: Nail nipper, rotary burr. Number of Nails: 10  Return in about 3 months (around 07/26/2020).

## 2020-04-28 DIAGNOSIS — R0683 Snoring: Secondary | ICD-10-CM | POA: Diagnosis not present

## 2020-04-28 DIAGNOSIS — K4021 Bilateral inguinal hernia, without obstruction or gangrene, recurrent: Secondary | ICD-10-CM | POA: Diagnosis not present

## 2020-04-28 DIAGNOSIS — G4733 Obstructive sleep apnea (adult) (pediatric): Secondary | ICD-10-CM | POA: Diagnosis not present

## 2020-04-28 DIAGNOSIS — K219 Gastro-esophageal reflux disease without esophagitis: Secondary | ICD-10-CM | POA: Diagnosis not present

## 2020-04-28 DIAGNOSIS — E119 Type 2 diabetes mellitus without complications: Secondary | ICD-10-CM | POA: Diagnosis not present

## 2020-04-28 DIAGNOSIS — Z8673 Personal history of transient ischemic attack (TIA), and cerebral infarction without residual deficits: Secondary | ICD-10-CM | POA: Diagnosis not present

## 2020-04-28 DIAGNOSIS — M1990 Unspecified osteoarthritis, unspecified site: Secondary | ICD-10-CM | POA: Diagnosis not present

## 2020-04-28 DIAGNOSIS — I10 Essential (primary) hypertension: Secondary | ICD-10-CM | POA: Diagnosis not present

## 2020-04-28 DIAGNOSIS — E785 Hyperlipidemia, unspecified: Secondary | ICD-10-CM | POA: Diagnosis not present

## 2020-05-04 ENCOUNTER — Other Ambulatory Visit: Payer: Self-pay

## 2020-05-04 MED ORDER — FLUTICASONE PROPIONATE 50 MCG/ACT NA SUSP: 2.0000 | Freq: Every day | NASAL | 6 refills | Status: DC | PRN

## 2020-05-05 DIAGNOSIS — Z09 Encounter for follow-up examination after completed treatment for conditions other than malignant neoplasm: Secondary | ICD-10-CM | POA: Diagnosis not present

## 2020-05-06 ENCOUNTER — Other Ambulatory Visit: Payer: Self-pay

## 2020-05-06 ENCOUNTER — Encounter: Payer: Self-pay | Admitting: Legal Medicine

## 2020-05-06 ENCOUNTER — Ambulatory Visit (INDEPENDENT_AMBULATORY_CARE_PROVIDER_SITE_OTHER): Payer: Medicare HMO | Admitting: Legal Medicine

## 2020-05-06 VITALS — BP 130/64 | HR 68 | Temp 97.6°F | Resp 17 | Ht 72.0 in | Wt 207.0 lb

## 2020-05-06 DIAGNOSIS — I69354 Hemiplegia and hemiparesis following cerebral infarction affecting left non-dominant side: Secondary | ICD-10-CM | POA: Diagnosis not present

## 2020-05-06 DIAGNOSIS — R5383 Other fatigue: Secondary | ICD-10-CM | POA: Diagnosis not present

## 2020-05-06 DIAGNOSIS — R270 Ataxia, unspecified: Secondary | ICD-10-CM

## 2020-05-06 DIAGNOSIS — E1142 Type 2 diabetes mellitus with diabetic polyneuropathy: Secondary | ICD-10-CM

## 2020-05-06 DIAGNOSIS — I10 Essential (primary) hypertension: Secondary | ICD-10-CM | POA: Diagnosis not present

## 2020-05-06 NOTE — Progress Notes (Signed)
Subjective:  Patient ID: Mark Stewart, male    DOB: 29-Mar-1946  Age: 74 y.o. MRN: [id number]  Chief Complaint  Patient presents with  . Dizziness    Patient has dizzines and lightheaded  . Fatigue    HPI: he has been dizzy for 2 months and fatigued.  He had laparoscopic herniorraphy last week.  No true vertigo. He has ataxic gait.   Current Outpatient Medications on File Prior to Visit  Medication Sig Dispense Refill  . albuterol (VENTOLIN HFA) 108 (90 Base) MCG/ACT inhaler Inhale 2 puffs into the lungs every 6 (six) hours as needed. 18 g 5  . amLODipine (NORVASC) 10 MG tablet Take 1 tablet (10 mg total) by mouth daily. 90 tablet 2  . aspirin EC 81 MG tablet Take 81 mg by mouth daily.    Marland Kitchen atorvastatin (LIPITOR) 80 MG tablet Take 1 tablet (80 mg total) by mouth daily. 30 tablet 6  . BAQSIMI TWO PACK 3 MG/DOSE POWD     . buPROPion (WELLBUTRIN XL) 150 MG 24 hr tablet Take 150 mg by mouth daily.    . carvedilol (COREG) 25 MG tablet TAKE 2 TABLETS (50 MG TOTAL) BY MOUTH 2 (TWO) TIMES DAILY. CALL FOR APPOINTMENT 360 tablet 1  . cloNIDine (CATAPRES - DOSED IN MG/24 HR) 0.2 mg/24hr patch PLACE 1 PATCH (0.2 MG TOTAL) ONTO THE SKIN ONCE A WEEK. 12 patch 3  . cyclobenzaprine (FLEXERIL) 10 MG tablet Take 1 tablet (10 mg total) by mouth 3 (three) times daily as needed for muscle spasms. 30 tablet 2  . empagliflozin (JARDIANCE) 25 MG TABS tablet Take 25 mg by mouth daily.     . fluticasone (FLONASE) 50 MCG/ACT nasal spray Place 2 sprays into both nostrils daily as needed for allergies or rhinitis. Reported on 04/26/2016 18.2 mL 6  . hydrochlorothiazide (HYDRODIURIL) 25 MG tablet Take 1 tablet (25 mg total) by mouth daily. 90 tablet 2  . insulin aspart (NOVOLOG) 100 UNIT/ML injection Inject 40 Units into the skin daily. Uses V-Go 30 units per day sq infustion     . Insulin Disposable Pump (OMNIPOD DASH 5 PACK PODS) MISC     . minoxidil (LONITEN) 10 MG tablet TAKE 1/2 TABLET BY MOUTH EVERY DAY 45  tablet 2  . omeprazole (PRILOSEC) 40 MG capsule Take 1 capsule (40 mg total) by mouth daily. 30 capsule 3  . spironolactone (ALDACTONE) 50 MG tablet     . valsartan (DIOVAN) 320 MG tablet Take 1 tablet (320 mg total) by mouth daily. 90 tablet 2   No current facility-administered medications on file prior to visit.   Past Medical History:  Diagnosis Date  . Allergy   . Cancer ([place])    skin, hand  . Cataract    history of surgery bilaterally  . Hypercholesteremia   . Hypertension   . 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 Stroke [facility]) 02/2016   Past Surgical History:  Procedure Laterality Date  . EYE SURGERY    . HERNIA REPAIR  02/28/2018  . PROSTATE SURGERY    . SPLENECTOMY      Family History  Problem Relation Age of Onset  . Breast cancer Mother   . Diabetes Mellitus II Father   . Breast cancer Sister    Social History   Socioeconomic History  . Marital status: Married    Spouse name: graye  . Number of children: Not on file  .  Years of education: Not on file  . Highest education level: Not on file  Occupational History  . Not on file  Tobacco Use  . Smoking status: Former Smoker    Packs/day: 1.00    Years: 20.00    Pack years: 20.00  . Smokeless tobacco: Never Used  . Tobacco comment: patient states last cigarette 35 years ago  Substance and Sexual Activity  . Alcohol use: No  . Drug use: No  . Sexual activity: Not Currently  Other Topics Concern  . Not on file  Social History Narrative  . Not on file   Social Determinants of Health   Financial Resource Strain:   . Difficulty of Paying Living Expenses:   Food Insecurity:   . Worried About Charity fundraiser in the Last Year:   . Arboriculturist in the Last Year:   Transportation Needs:   . Film/video editor (Medical):   Marland Kitchen Lack of Transportation (Non-Medical):   Physical Activity:   . Days of Exercise per Week:   . Minutes of Exercise per Session:   Stress:    . Feeling of Stress :   Social Connections:   . Frequency of Communication with Friends and Family:   . Frequency of Social Gatherings with Friends and Family:   . Attends Religious Services:   . Active Member of Clubs or Organizations:   . Attends Archivist Meetings:   Marland Kitchen Marital Status:     Review of Systems  Constitutional: Negative.   HENT: Negative.   Eyes: Negative.   Respiratory: Negative.   Cardiovascular: Negative.   Gastrointestinal: Negative.   Genitourinary: Negative.   Musculoskeletal: Negative.   Skin: Negative.   Neurological: Positive for dizziness and light-headedness.  Psychiatric/Behavioral: Negative.      Objective:  BP (!) 130/64 (BP Location: Left Arm, Patient Position: Sitting)   Pulse 68   Temp 97.6 F (36.4 C) (Temporal)   Resp 17   Ht 6' (1.829 m)   Wt (!) 207 lb (93.9 kg)   SpO2 97%   BMI 28.07 kg/m   BP/Weight 05/06/2020 04/05/2020 4/65/0354  Systolic BP 656 812 751  Diastolic BP 64 96 90  Wt. (Lbs) 207 210 210  BMI 28.07 28.48 28.48    Physical Exam Vitals reviewed.  Constitutional:      Appearance: Normal appearance.  HENT:     Head: Normocephalic and atraumatic.     Right Ear: Tympanic membrane, ear canal and external ear normal.     Left Ear: Tympanic membrane, ear canal and external ear normal.     Mouth/Throat:     Mouth: Mucous membranes are moist.  Eyes:     Extraocular Movements: Extraocular movements intact.     Conjunctiva/sclera: Conjunctivae normal.     Pupils: Pupils are equal, round, and reactive to light.  Cardiovascular:     Rate and Rhythm: Normal rate and regular rhythm.     Pulses: Normal pulses.     Heart sounds: Normal heart sounds.  Pulmonary:     Effort: Pulmonary effort is normal.     Breath sounds: Normal breath sounds.  Abdominal:     General: Abdomen is flat.  Musculoskeletal:     Cervical back: Normal range of motion and neck supple.  Neurological:     Mental Status: He is alert  and oriented to person, place, and time. Mental status is at baseline.     Cranial Nerves: Cranial nerves are intact.  Sensory: Sensation is intact.     Motor: Motor function is intact.     Coordination: Romberg sign positive. Coordination abnormal. Heel to Shin Test abnormal. Finger-Nose-Finger Test normal.     Gait: Gait abnormal.     Deep Tendon Reflexes: Babinski sign absent on the left side.     Reflex Scores:      Tricep reflexes are 2+ on the right side and 2+ on the left side.      Bicep reflexes are 2+ on the right side and 2+ on the left side.      Brachioradialis reflexes are 2+ on the right side and 2+ on the left side.      Patellar reflexes are 2+ on the right side and 2+ on the left side.      Achilles reflexes are 2+ on the right side and 2+ on the left side.    Diabetic Foot Exam - Simple   No data filed       Lab Results  Component Value Date   WBC 7.9 05/06/2020   HGB 15.3 05/06/2020   HCT 45.0 05/06/2020   PLT 315 05/06/2020   GLUCOSE 149 (H) 05/06/2020   CHOL 121 02/27/2016   TRIG 82 02/27/2016   HDL 34 (L) 02/27/2016   LDLCALC 71 02/27/2016   ALT 16 05/06/2020   AST 14 05/06/2020   NA 139 05/06/2020   K 4.5 05/06/2020   CL 102 05/06/2020   CREATININE 0.98 05/06/2020   BUN 16 05/06/2020   CO2 22 05/06/2020   HGBA1C 8.4 (H) 04/05/2020      Assessment & Plan:   1. Essential hypertension - CBC with Differential/Platelet - Comprehensive metabolic panel - Magnesium An individual hypertension care plan was established and reinforced today.  The patient's status was assessed using clinical findings on exam and labs or diagnostic tests. The patient's success at meeting treatment goals on disease specific evidence-based guidelines and found to be well controlled. SELF MANAGEMENT: The patient and I together assessed ways to personally work towards obtaining the recommended goals. RECOMMENDATIONS: avoid decongestants found in common cold remedies,  decrease consumption of alcohol, perform routine monitoring of BP with home BP cuff, exercise, reduction of dietary salt, take medicines as prescribed, try not to miss doses and quit smoking.  Regular exercise and maintaining a healthy weight is needed.  Stress reduction may help. A CLINICAL SUMMARY including written plan identify barriers to care unique to individual due to social or financial issues.  We attempt to mutually creat solutions for individual and family understanding.  2. Fatigue, unspecified type - CBC with Differential/Platelet - Comprehensive metabolic panel - Magnesium - Vitamin D, 25-hydroxy Patient has been chronically fatigued with no definitive reason, possible chronic effect of covid  3. Ataxia - MR Brain W Wo Contrast Patient has stoke in past and possible new lacunar stroke in cerebellum of brain stem  4. Hemiparesis affecting left side as late effect of stroke [facility]) Patient was evaluated using information from exam, tests and other diagnostic studies to perform evidence-based treatment for this disorder.  Opimizing treatment and improvement of neurologic deficits.  Patient is using cane to maintain as much independence as possible. Patient using cane.  Patient has full ability to perform ADLs.  5. Diabetic polyneuropathy associated with type 2 diabetes mellitus ([place]) An individual care plan for diabetes was established and reinforced today.  The patient's status was assessed using clinical findings on exam, labs and diagnostic testing. Patient success at meeting  goals based on disease specific evidence-based guidelines and found to be good controlled. Medications were assessed and patient's understanding of the medical issues , including barriers were assessed. Recommend adherence to a diabetic diet, a graduated exercise program, HgbA1c level is checked quarterly, and urine microalbumin performed yearly .  Annual mono-filament sensation testing performed. Lower blood  pressure and control hyperlipidemia is important. Get annual eye exams and annual flu shots and smoking cessation discussed.  Self management goals were discussed.   No orders of the defined types were placed in this encounter.   Orders Placed This Encounter  Procedures  . MR Brain W Wo Contrast  . CBC with Differential/Platelet  . Comprehensive metabolic panel  . Magnesium  . Vitamin D, 25-hydroxy     Follow-up: Return in about 1 month (around 06/06/2020).  An After Visit Summary was printed and given to the patient.  Bertram [phone]

## 2020-05-07 LAB — CBC WITH DIFFERENTIAL/PLATELET
Basophils Absolute: 0 10*3/uL (ref 0.0–0.2)
Basos: 1 %
EOS (ABSOLUTE): 1.2 10*3/uL — ABNORMAL HIGH (ref 0.0–0.4)
Eos: 15 %
Hematocrit: 45 % (ref 37.5–51.0)
Hemoglobin: 15.3 g/dL (ref 13.0–17.7)
Immature Grans (Abs): 0 10*3/uL (ref 0.0–0.1)
Immature Granulocytes: 0 %
Lymphocytes Absolute: 1.7 10*3/uL (ref 0.7–3.1)
Lymphs: 22 %
MCH: 30.8 pg (ref 26.6–33.0)
MCHC: 34 g/dL (ref 31.5–35.7)
MCV: 91 fL (ref 79–97)
Monocytes Absolute: 0.7 10*3/uL (ref 0.1–0.9)
Monocytes: 8 %
Neutrophils Absolute: 4.3 10*3/uL (ref 1.4–7.0)
Neutrophils: 54 %
Platelets: 315 10*3/uL (ref 150–450)
RBC: 4.96 x10E6/uL (ref 4.14–5.80)
RDW: 12.8 % (ref 11.6–15.4)
WBC: 7.9 10*3/uL (ref 3.4–10.8)

## 2020-05-07 LAB — COMPREHENSIVE METABOLIC PANEL
ALT: 16 IU/L (ref 0–44)
AST: 14 IU/L (ref 0–40)
Albumin/Globulin Ratio: 1.3 (ref 1.2–2.2)
Albumin: 3.8 g/dL (ref 3.7–4.7)
Alkaline Phosphatase: 99 IU/L (ref 48–121)
BUN/Creatinine Ratio: 16 (ref 10–24)
BUN: 16 mg/dL (ref 8–27)
Bilirubin Total: 0.5 mg/dL (ref 0.0–1.2)
CO2: 22 mmol/L (ref 20–29)
Calcium: 9.1 mg/dL (ref 8.6–10.2)
Chloride: 102 mmol/L (ref 96–106)
Creatinine, Ser: 0.98 mg/dL (ref 0.76–1.27)
GFR calc Af Amer: 87 mL/min/{1.73_m2} (ref >59)
GFR calc non Af Amer: 76 mL/min/{1.73_m2} (ref >59)
Globulin, Total: 3 g/dL (ref 1.5–4.5)
Glucose: 149 mg/dL — ABNORMAL HIGH (ref 65–99)
Potassium: 4.5 mmol/L (ref 3.5–5.2)
Sodium: 139 mmol/L (ref 134–144)
Total Protein: 6.8 g/dL (ref 6.0–8.5)

## 2020-05-07 LAB — VITAMIN D 25 HYDROXY (VIT D DEFICIENCY, FRACTURES): Vit D, 25-Hydroxy: 23.6 ng/mL — ABNORMAL LOW (ref 30.0–100.0)

## 2020-05-07 LAB — MAGNESIUM: Magnesium: 2.1 mg/dL (ref 1.6–2.3)

## 2020-05-08 ENCOUNTER — Other Ambulatory Visit: Payer: Self-pay | Admitting: Legal Medicine

## 2020-05-08 DIAGNOSIS — E559 Vitamin D deficiency, unspecified: Secondary | ICD-10-CM

## 2020-05-08 DIAGNOSIS — R270 Ataxia, unspecified: Secondary | ICD-10-CM | POA: Insufficient documentation

## 2020-05-08 MED ORDER — VITAMIN D (ERGOCALCIFEROL) 1.25 MG (50000 UNIT) PO CAPS: 50000.0000 [IU] | ORAL_CAPSULE | ORAL | 3 refills | Status: DC

## 2020-05-08 NOTE — Progress Notes (Signed)
CBC normal, Glucose 149 , kidney and liver testst normal, potassium and calcium normal, magnesium 2.1 normal, Vitamin D low at 23.6- I called in vitamin D  lp

## 2020-05-09 DIAGNOSIS — E113313 Type 2 diabetes mellitus with moderate nonproliferative diabetic retinopathy with macular edema, bilateral: Secondary | ICD-10-CM | POA: Diagnosis not present

## 2020-05-10 DIAGNOSIS — L570 Actinic keratosis: Secondary | ICD-10-CM | POA: Diagnosis not present

## 2020-05-10 DIAGNOSIS — L578 Other skin changes due to chronic exposure to nonionizing radiation: Secondary | ICD-10-CM | POA: Diagnosis not present

## 2020-05-10 DIAGNOSIS — L821 Other seborrheic keratosis: Secondary | ICD-10-CM | POA: Diagnosis not present

## 2020-05-10 DIAGNOSIS — Z85820 Personal history of malignant melanoma of skin: Secondary | ICD-10-CM | POA: Diagnosis not present

## 2020-05-13 DIAGNOSIS — E118 Type 2 diabetes mellitus with unspecified complications: Secondary | ICD-10-CM | POA: Diagnosis not present

## 2020-05-13 DIAGNOSIS — E7849 Other hyperlipidemia: Secondary | ICD-10-CM | POA: Diagnosis not present

## 2020-05-13 DIAGNOSIS — Z794 Long term (current) use of insulin: Secondary | ICD-10-CM | POA: Diagnosis not present

## 2020-05-16 DIAGNOSIS — R9082 White matter disease, unspecified: Secondary | ICD-10-CM | POA: Diagnosis not present

## 2020-05-16 DIAGNOSIS — R270 Ataxia, unspecified: Secondary | ICD-10-CM | POA: Diagnosis not present

## 2020-05-16 DIAGNOSIS — G9389 Other specified disorders of brain: Secondary | ICD-10-CM | POA: Diagnosis not present

## 2020-05-16 DIAGNOSIS — I6389 Other cerebral infarction: Secondary | ICD-10-CM | POA: Diagnosis not present

## 2020-05-16 DIAGNOSIS — I6521 Occlusion and stenosis of right carotid artery: Secondary | ICD-10-CM | POA: Diagnosis not present

## 2020-05-17 ENCOUNTER — Other Ambulatory Visit: Payer: Self-pay | Admitting: Legal Medicine

## 2020-05-17 DIAGNOSIS — I6529 Occlusion and stenosis of unspecified carotid artery: Secondary | ICD-10-CM | POA: Insufficient documentation

## 2020-05-19 DIAGNOSIS — E118 Type 2 diabetes mellitus with unspecified complications: Secondary | ICD-10-CM | POA: Diagnosis not present

## 2020-05-24 ENCOUNTER — Other Ambulatory Visit: Payer: Self-pay

## 2020-05-24 ENCOUNTER — Other Ambulatory Visit: Payer: Medicare HMO

## 2020-05-25 DIAGNOSIS — R9389 Abnormal findings on diagnostic imaging of other specified body structures: Secondary | ICD-10-CM | POA: Diagnosis not present

## 2020-05-25 DIAGNOSIS — I6523 Occlusion and stenosis of bilateral carotid arteries: Secondary | ICD-10-CM | POA: Diagnosis not present

## 2020-05-25 DIAGNOSIS — I708 Atherosclerosis of other arteries: Secondary | ICD-10-CM | POA: Diagnosis not present

## 2020-05-25 DIAGNOSIS — I6502 Occlusion and stenosis of left vertebral artery: Secondary | ICD-10-CM | POA: Diagnosis not present

## 2020-05-25 DIAGNOSIS — I672 Cerebral atherosclerosis: Secondary | ICD-10-CM | POA: Diagnosis not present

## 2020-05-25 DIAGNOSIS — R42 Dizziness and giddiness: Secondary | ICD-10-CM | POA: Diagnosis not present

## 2020-05-25 DIAGNOSIS — I6503 Occlusion and stenosis of bilateral vertebral arteries: Secondary | ICD-10-CM | POA: Diagnosis not present

## 2020-05-25 DIAGNOSIS — I700 Atherosclerosis of aorta: Secondary | ICD-10-CM | POA: Diagnosis not present

## 2020-05-25 DIAGNOSIS — I7090 Unspecified atherosclerosis: Secondary | ICD-10-CM | POA: Diagnosis not present

## 2020-05-25 DIAGNOSIS — I771 Stricture of artery: Secondary | ICD-10-CM | POA: Diagnosis not present

## 2020-05-26 ENCOUNTER — Other Ambulatory Visit: Payer: Self-pay

## 2020-05-26 DIAGNOSIS — I6521 Occlusion and stenosis of right carotid artery: Secondary | ICD-10-CM

## 2020-05-31 ENCOUNTER — Other Ambulatory Visit: Payer: Self-pay | Admitting: Legal Medicine

## 2020-06-03 ENCOUNTER — Encounter: Payer: Self-pay | Admitting: Legal Medicine

## 2020-06-03 ENCOUNTER — Telehealth (INDEPENDENT_AMBULATORY_CARE_PROVIDER_SITE_OTHER): Payer: Medicare HMO | Admitting: Legal Medicine

## 2020-06-03 ENCOUNTER — Other Ambulatory Visit: Payer: Self-pay | Admitting: Legal Medicine

## 2020-06-03 VITALS — Ht 72.0 in | Wt 200.0 lb

## 2020-06-03 DIAGNOSIS — Z20822 Contact with and (suspected) exposure to covid-19: Secondary | ICD-10-CM | POA: Diagnosis not present

## 2020-06-03 DIAGNOSIS — Z9189 Other specified personal risk factors, not elsewhere classified: Secondary | ICD-10-CM

## 2020-06-03 NOTE — Progress Notes (Signed)
Virtual Visit via Telephone Note   This visit type was conducted due to national recommendations for restrictions regarding the COVID-19 Pandemic (e.g. social distancing) in an effort to limit this patient's exposure and mitigate transmission in our community.  Due to his co-morbid illnesses, this patient is at least at moderate risk for complications without adequate follow up.  This format is felt to be most appropriate for this patient at this time.  The patient did not have access to video technology/had technical difficulties with video requiring transitioning to audio format only (telephone).  All issues noted in this document were discussed and addressed.  No physical exam could be performed with this format.  Patient verbally consented to a telehealth visit.   Date:  06/03/2020   ID:  Mark Stewart, DOB 03-09-1946, MRN [id number]  Patient Location: Home Provider Location: Office/Clinic  PCP:  Lillard Anes, MD   Evaluation Performed:  New Patient Evaluation  Chief Complaint:  Exposure to covid, wife positive, He is having congestion for one day but no fever or dyspnea  History of Present Illness:    MUSA REWERTS is a 74 y.o. male with exposed to covid, cough, aching, wife has covid.    The patient does have symptoms concerning for COVID-19 infection (fever, chills, cough, or new shortness of breath).    Past Medical History:  Diagnosis Date   Allergy    Cancer ([place])    skin, hand   Cataract    history of surgery bilaterally   Hypercholesteremia    Hypertension    ICAO (internal carotid artery occlusion)    Spleen absent    patient states had spleen removed in high school   Stroke [facility]) 02/2016    Past Surgical History:  Procedure Laterality Date   EYE SURGERY     HERNIA REPAIR  02/28/2018   PROSTATE SURGERY     SPLENECTOMY      Family History  Problem Relation Age of Onset   Breast cancer Mother    Diabetes Mellitus II Father      Breast cancer Sister     Social History   Socioeconomic History   Marital status: Married    Spouse name: graye   Number of children: Not on file   Years of education: Not on file   Highest education level: Not on file  Occupational History   Not on file  Tobacco Use   Smoking status: Former Smoker    Packs/day: 1.00    Years: 20.00    Pack years: 20.00   Smokeless tobacco: Never Used   Tobacco comment: patient states last cigarette 35 years ago  Substance and Sexual Activity   Alcohol use: No   Drug use: No   Sexual activity: Not Currently  Other Topics Concern   Not on file  Social History Narrative   Not on file   Social Determinants of Health   Financial Resource Strain:    Difficulty of Paying Living Expenses: Not on file  Food Insecurity:    Worried About Charity fundraiser in the Last Year: Not on file   [employer] of Food in the Last Year: Not on file  Transportation Needs:    Lack of Transportation (Medical): Not on file   Lack of Transportation (Non-Medical): Not on file  Physical Activity:    Days of Exercise per Week: Not on file   Minutes of Exercise per Session: Not on file  Stress:  Feeling of Stress : Not on file  Social Connections:    Frequency of Communication with Friends and Family: Not on file   Frequency of Social Gatherings with Friends and Family: Not on file   Attends Religious Services: Not on file   Active Member of Clubs or Organizations: Not on file   Attends Archivist Meetings: Not on file   Marital Status: Not on file  Intimate Partner Violence:    Fear of Current or Ex-Partner: Not on file   Emotionally Abused: Not on file   Physically Abused: Not on file   Sexually Abused: Not on file    Outpatient Medications Prior to Visit  Medication Sig Dispense Refill   albuterol (VENTOLIN HFA) 108 (90 Base) MCG/ACT inhaler Inhale 2 puffs into the lungs every 6 (six) hours as needed. 18 g  5   amLODipine (NORVASC) 10 MG tablet Take 1 tablet (10 mg total) by mouth daily. 90 tablet 2   aspirin EC 81 MG tablet Take 81 mg by mouth daily.     atorvastatin (LIPITOR) 80 MG tablet Take 1 tablet (80 mg total) by mouth daily. 30 tablet 6   BAQSIMI TWO PACK 3 MG/DOSE POWD      buPROPion (WELLBUTRIN XL) 150 MG 24 hr tablet Take 150 mg by mouth daily.     carvedilol (COREG) 25 MG tablet TAKE 2 TABLETS (50 MG TOTAL) BY MOUTH 2 (TWO) TIMES DAILY. CALL FOR APPOINTMENT 360 tablet 1   cloNIDine (CATAPRES - DOSED IN MG/24 HR) 0.2 mg/24hr patch PLACE 1 PATCH (0.2 MG TOTAL) ONTO THE SKIN ONCE A WEEK. 12 patch 3   cyclobenzaprine (FLEXERIL) 10 MG tablet Take 1 tablet (10 mg total) by mouth 3 (three) times daily as needed for muscle spasms. 30 tablet 2   empagliflozin (JARDIANCE) 25 MG TABS tablet Take 25 mg by mouth daily.      fluticasone (FLONASE) 50 MCG/ACT nasal spray Place 2 sprays into both nostrils daily as needed for allergies or rhinitis. Reported on 04/26/2016 18.2 mL 6   hydrochlorothiazide (HYDRODIURIL) 25 MG tablet Take 1 tablet (25 mg total) by mouth daily. 90 tablet 2   insulin aspart (NOVOLOG) 100 UNIT/ML injection Inject 40 Units into the skin daily. Uses V-Go 30 units per day sq infustion      Insulin Disposable Pump (OMNIPOD DASH 5 PACK PODS) MISC      minoxidil (LONITEN) 10 MG tablet TAKE 1/2 TABLET BY MOUTH EVERY DAY 45 tablet 2   omeprazole (PRILOSEC) 40 MG capsule Take 1 capsule (40 mg total) by mouth daily. 30 capsule 3   spironolactone (ALDACTONE) 50 MG tablet      valsartan (DIOVAN) 320 MG tablet Take 1 tablet (320 mg total) by mouth daily. 90 tablet 2   Vitamin D, Ergocalciferol, (DRISDOL) 1.25 MG (50000 UNIT) CAPS capsule Take 1 capsule (50,000 Units total) by mouth every 7 (seven) days. 12 capsule 3   No facility-administered medications prior to visit.   .med Allergies:   Benazepril, Metformin and related, and Penicillins   Social History   Tobacco  Use   Smoking status: Former Smoker    Packs/day: 1.00    Years: 20.00    Pack years: 20.00   Smokeless tobacco: Never Used   Tobacco comment: patient states last cigarette 35 years ago  Substance Use Topics   Alcohol use: No   Drug use: No     Review of Systems  Constitutional: Negative for chills and fever.  HENT: Positive for congestion. Negative for sore throat.   Eyes: Negative.   Respiratory: Negative for cough, hemoptysis, sputum production and shortness of breath.   Cardiovascular: Negative for chest pain.  Gastrointestinal: Negative.   Neurological: Negative.   Psychiatric/Behavioral: Negative.      Labs/Other Tests and Data Reviewed:    Recent Labs: 05/06/2020: ALT 16; BUN 16; Creatinine, Ser 0.98; Hemoglobin 15.3; Magnesium 2.1; Platelets 315; Potassium 4.5; Sodium 139   Recent Lipid Panel Lab Results  Component Value Date/Time   CHOL 121 02/27/2016 05:36 AM   TRIG 82 02/27/2016 05:36 AM   HDL 34 (L) 02/27/2016 05:36 AM   CHOLHDL 3.6 02/27/2016 05:36 AM   LDLCALC 71 02/27/2016 05:36 AM    Wt Readings from Last 3 Encounters:  06/03/20 200 lb (90.7 kg)  05/06/20 (!) 207 lb (93.9 kg)  04/05/20 210 lb (95.3 kg)     Objective:    Vital Signs:  Ht 6' (1.829 m)    Wt 200 lb (90.7 kg)    BMI 27.12 kg/m    Physical Exam VS stable  ASSESSMENT & PLAN:   Diagnoses and all orders for this visit: At increased risk of exposure to COVID-19 virus -     Novel Coronavirus, NAA ([facility]) Close exposure to COVID-19 virus Other orders -     SARS-COV-2, NAA 2 DAY TAT Patient has exposure with wife testing positive , needs testing and may need further treatment since he is a risk.       COVID-19 Education: The signs and symptoms of COVID-19 were discussed with the patient and how to seek care for testing (follow up with PCP or arrange E-visit). The importance of social distancing was discussed today.  Time:   Today, I have spent 20 minutes with the patient  with telehealth technology discussing the above problems.    Follow Up:  In Person prn  Signed, Reinaldo Meeker, MD  06/03/2020 9:40 AM    [place]

## 2020-06-04 LAB — SARS-COV-2, NAA 2 DAY TAT

## 2020-06-04 LAB — NOVEL CORONAVIRUS, NAA: SARS-CoV-2, NAA: DETECTED — AB

## 2020-06-05 ENCOUNTER — Other Ambulatory Visit: Payer: Self-pay | Admitting: Legal Medicine

## 2020-06-05 ENCOUNTER — Telehealth (HOSPITAL_COMMUNITY): Payer: Self-pay | Admitting: Nurse Practitioner

## 2020-06-05 DIAGNOSIS — Z20822 Contact with and (suspected) exposure to covid-19: Secondary | ICD-10-CM | POA: Insufficient documentation

## 2020-06-05 DIAGNOSIS — U071 COVID-19: Secondary | ICD-10-CM

## 2020-06-05 NOTE — Telephone Encounter (Signed)
Called to discuss with Ed Blalock [place] about Covid symptoms and the use of regeneron, a monoclonal antibody infusion for those with mild to moderate Covid symptoms and at a high risk of hospitalization.     Pt does not qualify for infusion therapy given asymptomatic infection. Isolation precautions discussed. Advised to contact back for consideration should he develop symptoms. Patient verbalized understanding.   Also discussed patient's wife who is covid positive, however her symptoms have been present for > 2 weeks and therefore ineligible for mab.     Patient Active Problem List   Diagnosis Date Noted   ICAO (internal carotid artery occlusion)    Ataxia 05/08/2020   Diabetic polyneuropathy ([place]) 04/05/2020   BMI 28.0-28.9,adult 03/10/2020   GERD (gastroesophageal reflux disease) 02/23/2020   Obstructive chronic bronchitis without exacerbation ([place]) 02/23/2020   Low back pain 02/23/2020   Breast mass in male 02/23/2020   Hemiparesis affecting left side as late effect of stroke ([place]) 10/18/2018   Obstructive sleep apnea 04/24/2018   Somnolence, daytime 10/25/2017   Left hemiparesis ([place]) 07/19/2017   Diabetes mellitus with hyperglycemia, with long-term current use of insulin ([place]) 02/28/2016   Hypercholesterolemia 02/27/2016   Essential hypertension 02/27/2016   Cerebral infarction due to cerebral artery occlusion ([place])    Poorly controlled diabetes mellitus ([place])    Acute CVA (cerebrovascular accident) ([place]) 02/26/2016     Beckey Rutter, NP [place] [facility]  [phone] [email or web address]

## 2020-06-05 NOTE — Progress Notes (Signed)
Covid test positive I called and left message, may need treatment lp

## 2020-06-06 ENCOUNTER — Ambulatory Visit: Payer: Medicare HMO | Admitting: Legal Medicine

## 2020-06-14 ENCOUNTER — Other Ambulatory Visit: Payer: Self-pay

## 2020-06-14 ENCOUNTER — Ambulatory Visit (INDEPENDENT_AMBULATORY_CARE_PROVIDER_SITE_OTHER): Payer: Medicare HMO | Admitting: Vascular Surgery

## 2020-06-14 ENCOUNTER — Encounter: Payer: Self-pay | Admitting: Vascular Surgery

## 2020-06-14 DIAGNOSIS — I6521 Occlusion and stenosis of right carotid artery: Secondary | ICD-10-CM | POA: Diagnosis not present

## 2020-06-14 DIAGNOSIS — I6529 Occlusion and stenosis of unspecified carotid artery: Secondary | ICD-10-CM | POA: Insufficient documentation

## 2020-06-14 NOTE — Progress Notes (Signed)
Virtual Visit via Telephone Note  Referring MD: Dr. Henrene Pastor  I connected with Ed Blalock Falin on 06/14/2020 using the [employer] by telephone and verified that I was speaking with the correct person using two identifiers. The limitations of evaluation and management by telemedicine and the availability of in person appointments have been previously discussed with the patient and are documented in the patients chart. The patient expressed understanding and consented to proceed.  PCP: Lillard Anes, MD  Chief Complaint: Right carotid artery occlusion  History of Present Illness: Mark Stewart is a 74 y.o. male with with history of hypertension, hyperlipidemia, previous right hemispheric stroke in 2017 that presents as a referral from Dr. Henrene Pastor for evaluation of possible right carotid occlusion.  This visit is being conducted via telephone given patient's recent diagnosis of Covid 19 and ongoing symptoms.  Patient reports he initially presented to his PCP in the middle of August with worsening dizziness over the last 3 months.  He's had no other focal neurologic symptoms other than just unsteadiness on his feet.  Initially had an MRI brain on 05/16/2020 that showed large right thalamic infarct from his previous stroke in 2017 and concern for occlusion versus poor flow in the right ICA.  There was no new acute or subacute infarction noted on MRI.  Subsequently had a follow-up CTA head and neck on 05/25/2020 showed greater than 70% stenosis of the right carotid siphon with some atherosclerotic disease of the carotid bifurcations but without any significant stenosis at the bifurcation.  His wife reports no additional neurologic events since his stroke in 2017.  At that point in time sounds like he had some dysarthria as well as some left leg weakness with walking.  Past Medical History:  Diagnosis Date  . Allergy   . Cancer ([place])    skin, hand  . Cataract    history of surgery bilaterally  .  Hypercholesteremia   . Hypertension   . ICAO (internal carotid artery occlusion)   . 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 Stroke [facility]) 02/2016    Past Surgical History:  Procedure Laterality Date  . EYE SURGERY    . HERNIA REPAIR  02/28/2018  . PROSTATE SURGERY    . SPLENECTOMY      Current Meds  Medication Sig  . albuterol (VENTOLIN HFA) 108 (90 Base) MCG/ACT inhaler Inhale 2 puffs into the lungs every 6 (six) hours as needed.  Marland Kitchen amLODipine (NORVASC) 10 MG tablet Take 1 tablet (10 mg total) by mouth daily.  Marland Kitchen aspirin EC 81 MG tablet Take 81 mg by mouth daily.  Marland Kitchen atorvastatin (LIPITOR) 80 MG tablet Take 1 tablet (80 mg total) by mouth daily.  Marland Kitchen BAQSIMI TWO PACK 3 MG/DOSE POWD   . buPROPion (WELLBUTRIN XL) 150 MG 24 hr tablet Take 150 mg by mouth daily.  . carvedilol (COREG) 25 MG tablet TAKE 2 TABLETS (50 MG TOTAL) BY MOUTH 2 (TWO) TIMES DAILY. CALL FOR APPOINTMENT  . cloNIDine (CATAPRES - DOSED IN MG/24 HR) 0.2 mg/24hr patch PLACE 1 PATCH (0.2 MG TOTAL) ONTO THE SKIN ONCE A WEEK.  . cyclobenzaprine (FLEXERIL) 10 MG tablet Take 1 tablet (10 mg total) by mouth 3 (three) times daily as needed for muscle spasms.  . empagliflozin (JARDIANCE) 25 MG TABS tablet Take 25 mg by mouth daily.   . fluticasone (FLONASE) 50 MCG/ACT nasal spray Place 2 sprays into both nostrils daily as needed  for allergies or rhinitis. Reported on 04/26/2016  . hydrochlorothiazide (HYDRODIURIL) 25 MG tablet Take 1 tablet (25 mg total) by mouth daily.  . insulin aspart (NOVOLOG) 100 UNIT/ML injection Inject 40 Units into the skin daily. Uses V-Go 30 units per day sq infustion   . Insulin Disposable Pump (OMNIPOD DASH 5 PACK PODS) MISC   . minoxidil (LONITEN) 10 MG tablet TAKE 1/2 TABLET BY MOUTH EVERY DAY  . omeprazole (PRILOSEC) 40 MG capsule Take 1 capsule (40 mg total) by mouth daily.  Marland Kitchen spironolactone (ALDACTONE) 50 MG tablet   . valsartan (DIOVAN) 320 MG tablet Take 1 tablet  (320 mg total) by mouth daily.  . Vitamin D, Ergocalciferol, (DRISDOL) 1.25 MG (50000 UNIT) CAPS capsule Take 1 capsule (50,000 Units total) by mouth every 7 (seven) days.    12 system ROS was negative unless otherwise noted in HPI   Observations/Objective:  CLINICAL DATA: Dizziness, weakness and fusion. Abnormal MRI showing abnormal flow in the right ICA.  EXAM: CT ANGIOGRAPHY HEAD AND NECK  TECHNIQUE: Multidetector CT imaging of the head and neck was performed using the standard protocol during bolus administration of intravenous contrast. Multiplanar CT image reconstructions and MIPs were obtained to evaluate the vascular anatomy. Carotid stenosis measurements (when applicable) are obtained utilizing NASCET criteria, using the distal internal carotid diameter as the denominator.  CONTRAST: 100 cc Isovue 370  COMPARISON: MRI 05/16/2020  FINDINGS: CT HEAD FINDINGS  Brain: Age related atrophy. Old infarction in the right thalamus. Mild chronic small-vessel ischemic change of the white matter. No sign of acute infarction, mass lesion, hemorrhage, hydrocephalus or extra-axial collection.  Vascular: There is atherosclerotic calcification of the major vessels at the base of the brain.  Skull: Normal  Sinuses: Clear  Orbits: Normal  Review of the MIP images confirms the above findings  CTA NECK FINDINGS  Aortic arch: Aortic atherosclerosis. Branching pattern is normal without brachiocephalic vessel origin stenosis. 30% stenosis of the proximal left subclavian artery.  Right carotid system: Common carotid artery widely patent to the bifurcation region. Calcified plaque at the carotid bifurcation and ICA bulb. Minimal diameter of the ICA bulb is 4 mm. Compared to a more distal cervical ICA diameter of 4 mm, there is no stenosis.  Left carotid system: Common carotid artery shows some soft plaque but no stenosis. Calcified plaque at the carotid artery  bifurcation. Soft plaque in the ICA bulb. Minimal diameter the ICA bulb 5 mm, the same as the more distal cervical ICA, therefore no stenosis.  Vertebral arteries: 50% stenosis of the dominant left vertebral artery origin. Minimal narrowing of the non dominant right vertebral artery origin. Beyond that, both vertebral arteries are patent through the cervical region to the foramen magnum.  Skeleton: Ordinary mid cervical spondylosis.  Other neck: No mass or lymphadenopathy.  Upper chest: Normal  Review of the MIP images confirms the above findings  CTA HEAD FINDINGS  Anterior circulation: Both internal carotid arteries patent through the skull base. Left carotid siphon shows mild atherosclerotic calcification but without stenosis. Right carotid siphon shows more advanced atherosclerotic disease in the distal siphon and supraclinoid ICA with severe stenosis, 70% or greater. Both anterior and middle cerebral vessels are patent. Left PCA takes fetal origin from the anterior circulation. No intracranial large or medium vessel occlusion identified.  Posterior circulation: Both vertebral arteries are patent through the foramen magnum. Both picas are patent. No basilar stenosis. Posterior circulation branch vessels are patent.  Venous sinuses: Patent and normal.  Anatomic  variants: None other significant.  Review of the MIP images confirms the above findings  IMPRESSION: The abnormal appearance of the right ICA on MRI is due to severe stenosis in the right carotid siphon region, 70% or greater. Mild atherosclerotic disease of the left carotid siphon without significant stenosis.  Atherosclerotic disease at both carotid bifurcations but without stenosis.  Aortic atherosclerosis.  30% stenosis of the proximal left subclavian artery.  50% stenosis the left vertebral artery origin.   Electronically Signed By: Nelson Chimes M.D. On: 05/26/2020 08:07  Assessment and  Plan:  74 year old male recently diagnosed with COVID-19 here for evaluation of possible right ICA occlusion noted on MRI as further work-up for ongoing dizziness over the last 3 months.  Discussed with the patient and his wife that after review of his CTA head and neck his right ICA is patent up to the skull base and I do not see any significant bifurcation disease from my standpoint other than calcification without any significant stenosis.  His stenosis is in the right carotid siphon which is above the skull base and discussed that this is not typically in the area that we operate as vascular surgeons.  I am not sure that he needs any intervention and may best be served with medical management but I think he should be evaluated by one of the neurosurgeons here in town and I recommended a referral to Dr. Consuella Lose for his opinion.  This is an asymptomatic lesion that was discovered on CTA head and neck and discussed with him and his wife I doubt this is related to his dizziness.   Follow Up Instructions:   Follow up refer to Dr. Consuella Lose, [facility]   I discussed the assessment and treatment plan with the patient. The patient was provided an opportunity to ask questions and all were answered. The patient agreed with the plan and demonstrated an understanding of the instructions.   The patient was advised to call back or seek an in-person evaluation if the symptoms worsen or if the condition fails to improve as anticipated.  I spent 15 minutes with the patient via telephone encounter.   Signed, Marty Heck [facility] Office: [phone]  06/14/2020, 11:08 AM

## 2020-06-15 ENCOUNTER — Ambulatory Visit: Payer: Medicare HMO | Admitting: Legal Medicine

## 2020-06-16 ENCOUNTER — Encounter (HOSPITAL_COMMUNITY): Payer: Self-pay

## 2020-06-16 ENCOUNTER — Other Ambulatory Visit: Payer: Self-pay

## 2020-06-16 ENCOUNTER — Emergency Department (HOSPITAL_COMMUNITY)
Admission: EM | Admit: 2020-06-16 | Discharge: 2020-06-16 | Disposition: A | Discharge status: Home / Self Care | Payer: Medicare HMO | Attending: Emergency Medicine | Admitting: Emergency Medicine

## 2020-06-16 DIAGNOSIS — E1142 Type 2 diabetes mellitus with diabetic polyneuropathy: Secondary | ICD-10-CM | POA: Insufficient documentation

## 2020-06-16 DIAGNOSIS — Z794 Long term (current) use of insulin: Secondary | ICD-10-CM | POA: Diagnosis not present

## 2020-06-16 DIAGNOSIS — Z87891 Personal history of nicotine dependence: Secondary | ICD-10-CM | POA: Diagnosis not present

## 2020-06-16 DIAGNOSIS — I672 Cerebral atherosclerosis: Secondary | ICD-10-CM | POA: Diagnosis not present

## 2020-06-16 DIAGNOSIS — Z7982 Long term (current) use of aspirin: Secondary | ICD-10-CM | POA: Diagnosis not present

## 2020-06-16 DIAGNOSIS — I10 Essential (primary) hypertension: Secondary | ICD-10-CM | POA: Insufficient documentation

## 2020-06-16 DIAGNOSIS — Z85828 Personal history of other malignant neoplasm of skin: Secondary | ICD-10-CM | POA: Diagnosis not present

## 2020-06-16 DIAGNOSIS — Z79899 Other long term (current) drug therapy: Secondary | ICD-10-CM | POA: Insufficient documentation

## 2020-06-16 LAB — BASIC METABOLIC PANEL
Anion gap: 8 (ref 5–15)
BUN: 19 mg/dL (ref 8–23)
CO2: 26 mmol/L (ref 22–32)
Calcium: 8.9 mg/dL (ref 8.9–10.3)
Chloride: 104 mmol/L (ref 98–111)
Creatinine, Ser: 1.01 mg/dL (ref 0.61–1.24)
GFR calc Af Amer: >60 mL/min (ref >60)
GFR calc non Af Amer: >60 mL/min (ref >60)
Glucose, Bld: 193 mg/dL — ABNORMAL HIGH (ref 70–99)
Potassium: 3.9 mmol/L (ref 3.5–5.1)
Sodium: 138 mmol/L (ref 135–145)

## 2020-06-16 LAB — CBC
HCT: 44.4 % (ref 39.0–52.0)
Hemoglobin: 14.7 g/dL (ref 13.0–17.0)
MCH: 30.6 pg (ref 26.0–34.0)
MCHC: 33.1 g/dL (ref 30.0–36.0)
MCV: 92.5 fL (ref 80.0–100.0)
Platelets: 318 10*3/uL (ref 150–400)
RBC: 4.8 MIL/uL (ref 4.22–5.81)
RDW: 14.6 % (ref 11.5–15.5)
WBC: 6.7 10*3/uL (ref 4.0–10.5)
nRBC: 0 % (ref 0.0–0.2)

## 2020-06-16 LAB — CBG MONITORING, ED
Glucose-Capillary: 79 mg/dL (ref 70–99)
Glucose-Capillary: 87 mg/dL (ref 70–99)

## 2020-06-16 NOTE — Discharge Instructions (Signed)
Continue your regular blood pressure regimen that was previously prescribed.  Schedule follow-up appointment with your primary doctor to discuss management of your blood pressure.  If you develop headache, chest pain, back pain, difficulty in breathing or other new concerning symptom, return to ER for reassessment.

## 2020-06-16 NOTE — ED Notes (Signed)
Glucometer -79. Given OJ.

## 2020-06-16 NOTE — ED Notes (Signed)
Pt verbalized discharge teaching. Pt advised to follow blood pressure medication regimen. Family and pt verbalized understand.

## 2020-06-16 NOTE — ED Provider Notes (Signed)
[facility] EMERGENCY DEPARTMENT Provider Note   CSN: [id number] Arrival date & time: 06/16/20  1207     History Chief Complaint  Patient presents with  . Hypertension    Mark Stewart is a 74 y.o. male.  Presents to ER with concern for high blood pressure.  He reports that he was at a neurosurgery clinic appointment this morning when they noted his blood pressure was very elevated.  He was recommended to go to the hospital for further evaluation.  He reports he missed his morning medications because he was going to the doctor's appointment.  He has not taken any of his blood pressure medicines today.  He has not had any recent changes in his blood pressure regimen.  He does not have any associated headache, numbness, weakness, vision changes, chest pain, back pain, difficulty breathing or other new concerning symptoms.  HPI     Past Medical History:  Diagnosis Date  . Allergy   . Cancer ([place])    skin, hand  . Cataract    history of surgery bilaterally  . Hypercholesteremia   . Hypertension   . ICAO (internal carotid artery occlusion)   . 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 Stroke [facility]) 02/2016    Patient Active Problem List   Diagnosis Date Noted  . Carotid stenosis 06/14/2020  . Close exposure to COVID-19 virus 06/05/2020  . Ataxia 05/08/2020  . Diabetic polyneuropathy ([place]) 04/05/2020  . BMI 28.0-28.9,adult 03/10/2020  . GERD (gastroesophageal reflux disease) 02/23/2020  . Obstructive chronic bronchitis without exacerbation ([place]) 02/23/2020  . Low back pain 02/23/2020  . Breast mass in male 02/23/2020  . Hemiparesis affecting left side as late effect of stroke ([place]) 10/18/2018  . Obstructive sleep apnea 04/24/2018  . Somnolence, daytime 10/25/2017  . Left hemiparesis ([place]) 07/19/2017  . Diabetes mellitus with hyperglycemia, with long-term current use of insulin ([place]) 02/28/2016  . Hypercholesterolemia 02/27/2016   . Essential hypertension 02/27/2016  . Cerebral infarction due to cerebral artery occlusion ([place])   . Poorly controlled diabetes mellitus ([place])   . Acute CVA (cerebrovascular accident) ([place]) 02/26/2016    Past Surgical History:  Procedure Laterality Date  . EYE SURGERY    . HERNIA REPAIR  02/28/2018  . PROSTATE SURGERY    . SPLENECTOMY         Family History  Problem Relation Age of Onset  . Breast cancer Mother   . Diabetes Mellitus II Father   . Breast cancer Sister     Social History   Tobacco Use  . Smoking status: Former Smoker    Packs/day: 1.00    Years: 20.00    Pack years: 20.00  . Smokeless tobacco: Never Used  . Tobacco comment: patient states last cigarette 35 years ago  Substance Use Topics  . Alcohol use: No  . Drug use: No    Home Medications Prior to Admission medications   Medication Sig Start Date End Date Taking? Authorizing Provider  albuterol (VENTOLIN HFA) 108 (90 Base) MCG/ACT inhaler Inhale 2 puffs into the lungs every 6 (six) hours as needed. 02/24/20   Lillard Anes, MD  amLODipine (NORVASC) 10 MG tablet Take 1 tablet (10 mg total) by mouth daily. 02/24/20   Lillard Anes, MD  aspirin EC 81 MG tablet Take 81 mg by mouth daily.    [provider]  atorvastatin (LIPITOR) 80 MG tablet Take 1 tablet (80 mg  total) by mouth daily. 01/20/20   Lillard Anes, MD  BAQSIMI TWO PACK 3 MG/DOSE POWD  02/10/20   [provider]  buPROPion (WELLBUTRIN XL) 150 MG 24 hr tablet Take 150 mg by mouth daily.    [provider]  carvedilol (COREG) 25 MG tablet TAKE 2 TABLETS (50 MG TOTAL) BY MOUTH 2 (TWO) TIMES DAILY. CALL FOR APPOINTMENT 03/16/20   Croitoru, Dani Gobble, MD  cloNIDine (CATAPRES - DOSED IN MG/24 HR) 0.2 mg/24hr patch PLACE 1 PATCH (0.2 MG TOTAL) ONTO THE SKIN ONCE A WEEK. 02/19/19   Croitoru, Mihai, MD  cyclobenzaprine (FLEXERIL) 10 MG tablet Take 1 tablet (10 mg total) by mouth 3 (three) times daily as  needed for muscle spasms. 03/28/20   Lillard Anes, MD  empagliflozin (JARDIANCE) 25 MG TABS tablet Take 25 mg by mouth daily.     [provider]  fluticasone (FLONASE) 50 MCG/ACT nasal spray Place 2 sprays into both nostrils daily as needed for allergies or rhinitis. Reported on 04/26/2016 05/04/20   Lillard Anes, MD  hydrochlorothiazide (HYDRODIURIL) 25 MG tablet Take 1 tablet (25 mg total) by mouth daily. 02/24/20   Lillard Anes, MD  insulin aspart (NOVOLOG) 100 UNIT/ML injection Inject 40 Units into the skin daily. Uses V-Go 30 units per day sq infustion     [provider]  Insulin Disposable Pump (OMNIPOD DASH 5 PACK PODS) MISC  02/19/20   [provider]  minoxidil (LONITEN) 10 MG tablet TAKE 1/2 TABLET BY MOUTH EVERY DAY 03/25/20   Croitoru, Mihai, MD  omeprazole (PRILOSEC) 40 MG capsule Take 1 capsule (40 mg total) by mouth daily. 02/23/20   Lillard Anes, MD  spironolactone (ALDACTONE) 50 MG tablet  02/12/20   [provider]  valsartan (DIOVAN) 320 MG tablet Take 1 tablet (320 mg total) by mouth daily. 02/24/20   Lillard Anes, MD  Vitamin D, Ergocalciferol, (DRISDOL) 1.25 MG (50000 UNIT) CAPS capsule Take 1 capsule (50,000 Units total) by mouth every 7 (seven) days. 05/08/20   Lillard Anes, MD    Allergies    Benazepril, Metformin and related, and Penicillins  Review of Systems   Review of Systems  Constitutional: Negative for chills and fever.  HENT: Negative for ear pain and sore throat.   Eyes: Negative for pain and visual disturbance.  Respiratory: Negative for cough and shortness of breath.   Cardiovascular: Negative for chest pain and palpitations.  Gastrointestinal: Negative for abdominal pain and vomiting.  Genitourinary: Negative for dysuria and hematuria.  Musculoskeletal: Negative for arthralgias and back pain.  Skin: Negative for color change and rash.  Neurological: Negative for  seizures and syncope.  All other systems reviewed and are negative.   Physical Exam Updated Vital Signs BP (!) 159/123   Pulse 64   Temp 98 F (36.7 C) (Oral)   Resp 19   Ht 6' (1.829 m)   Wt 90.7 kg   SpO2 98%   BMI 27.12 kg/m   Physical Exam Vitals and nursing note reviewed.  Constitutional:      Appearance: He is well-developed.  HENT:     Head: Normocephalic and atraumatic.  Eyes:     Conjunctiva/sclera: Conjunctivae normal.  Cardiovascular:     Rate and Rhythm: Normal rate and regular rhythm.     Heart sounds: No murmur heard.   Pulmonary:     Effort: Pulmonary effort is normal. No respiratory distress.     Breath sounds: Normal breath  sounds.  Abdominal:     Palpations: Abdomen is soft.     Tenderness: There is no abdominal tenderness.  Musculoskeletal:     Cervical back: Neck supple.  Skin:    General: Skin is warm and dry.  Neurological:     Mental Status: He is alert.     Comments: AAOx3 CN 2-12 intact, speech clear visual fields intact 5/5 strength in b/l UE and LE Sensation to light touch intact in b/l UE and LE Normal FNF Normal gait     ED Results / Procedures / Treatments   Labs (all labs ordered are listed, but only abnormal results are displayed) Labs Reviewed  BASIC METABOLIC PANEL - Abnormal; Notable for the following components:      Result Value   Glucose, Bld 193 (*)    All other components within normal limits  CBC  CBG MONITORING, ED  CBG MONITORING, ED    EKG None  EKG performed 2218 on 06/16/2020 shows normal sinus rhythm with rate 65, normal axis, normal intervals, no acute ST segment changes or T wave inversions   Radiology No results found.  Procedures Procedures (including critical care time)  Medications Ordered in ED Medications - No data to display  ED Course  I have reviewed the triage vital signs and the nursing notes.  Pertinent labs & imaging results that were available during my care of the patient  were reviewed by me and considered in my medical decision making (see chart for details).    MDM Rules/Calculators/A&P                         74 year old male presents to ER with concern for elevated blood pressure.  He has long history of hypertension that is managed by his primary care doctor.  This finding was incidentally noted by neurosurgery clinic.  Patient has not had any symptoms today associated with this finding.  He is remarkably well-appearing with a normal physical exam including normal neurologic exam.  Recommend that he continue his home blood pressure medication and follow-up with his primary doctor.    After the discussed management above, the patient was determined to be safe for discharge.  The patient was in agreement with this plan and all questions regarding their care were answered.  ED return precautions were discussed and the patient will return to the ED with any significant worsening of condition.   Final Clinical Impression(s) / ED Diagnoses Final diagnoses:  Hypertension, unspecified type    Rx / DC Orders ED Discharge Orders    None       Lucrezia Starch, MD 06/16/20 2242

## 2020-06-16 NOTE — ED Notes (Signed)
Patient had no meds today except clonindine patch. Has insulin pump. Omni check of blood sugar is 57. Getting charge nurse and checking glucometer.

## 2020-06-16 NOTE — ED Triage Notes (Signed)
Pt arrives to ED w/ c/o HTN. Pt did not take BP medication today prior to going to PCP. SBP 190s so PCP sent pt to ED for eval.

## 2020-06-20 DIAGNOSIS — E118 Type 2 diabetes mellitus with unspecified complications: Secondary | ICD-10-CM | POA: Diagnosis not present

## 2020-06-24 ENCOUNTER — Ambulatory Visit (INDEPENDENT_AMBULATORY_CARE_PROVIDER_SITE_OTHER): Payer: Medicare HMO | Admitting: Legal Medicine

## 2020-06-24 ENCOUNTER — Encounter: Payer: Self-pay | Admitting: Legal Medicine

## 2020-06-24 ENCOUNTER — Other Ambulatory Visit: Payer: Self-pay

## 2020-06-24 VITALS — BP 120/60 | HR 69 | Temp 97.7°F | Resp 17 | Ht 72.0 in | Wt 205.0 lb

## 2020-06-24 DIAGNOSIS — M1611 Unilateral primary osteoarthritis, right hip: Secondary | ICD-10-CM | POA: Diagnosis not present

## 2020-06-24 DIAGNOSIS — M5116 Intervertebral disc disorders with radiculopathy, lumbar region: Secondary | ICD-10-CM | POA: Diagnosis not present

## 2020-06-24 DIAGNOSIS — M5137 Other intervertebral disc degeneration, lumbosacral region: Secondary | ICD-10-CM | POA: Diagnosis not present

## 2020-06-24 DIAGNOSIS — M25551 Pain in right hip: Secondary | ICD-10-CM

## 2020-06-24 DIAGNOSIS — Z23 Encounter for immunization: Secondary | ICD-10-CM

## 2020-06-24 DIAGNOSIS — M8448XD Pathological fracture, other site, subsequent encounter for fracture with routine healing: Secondary | ICD-10-CM | POA: Diagnosis not present

## 2020-06-24 DIAGNOSIS — M545 Low back pain: Secondary | ICD-10-CM | POA: Diagnosis not present

## 2020-06-24 DIAGNOSIS — M4186 Other forms of scoliosis, lumbar region: Secondary | ICD-10-CM | POA: Diagnosis not present

## 2020-06-24 NOTE — Progress Notes (Addendum)
Subjective:  Patient ID: Mark Stewart, male    DOB: 1946/02/28  Age: 74 y.o. MRN: [id number]  Chief Complaint  Patient presents with  . Dizziness  . Leg Pain    HPI: recurrent pain right upper leg.  It was better after hernia surgeries.  Now it is worse.  Sometime worse with walking and at night.  He has seen vascular surgeon and has neurology appointment.  He also saw neurosurgeon. Some pain lower back.   Current Outpatient Medications on File Prior to Visit  Medication Sig Dispense Refill  . albuterol (VENTOLIN HFA) 108 (90 Base) MCG/ACT inhaler Inhale 2 puffs into the lungs every 6 (six) hours as needed. 18 g 5  . amLODipine (NORVASC) 10 MG tablet Take 1 tablet (10 mg total) by mouth daily. 90 tablet 2  . aspirin EC 81 MG tablet Take 81 mg by mouth daily.    Marland Kitchen atorvastatin (LIPITOR) 80 MG tablet Take 1 tablet (80 mg total) by mouth daily. 30 tablet 6  . BAQSIMI TWO PACK 3 MG/DOSE POWD     . buPROPion (WELLBUTRIN XL) 150 MG 24 hr tablet Take 150 mg by mouth daily.    . carvedilol (COREG) 25 MG tablet TAKE 2 TABLETS (50 MG TOTAL) BY MOUTH 2 (TWO) TIMES DAILY. CALL FOR APPOINTMENT 360 tablet 1  . cloNIDine (CATAPRES - DOSED IN MG/24 HR) 0.2 mg/24hr patch PLACE 1 PATCH (0.2 MG TOTAL) ONTO THE SKIN ONCE A WEEK. 12 patch 3  . cyclobenzaprine (FLEXERIL) 10 MG tablet Take 1 tablet (10 mg total) by mouth 3 (three) times daily as needed for muscle spasms. 30 tablet 2  . empagliflozin (JARDIANCE) 25 MG TABS tablet Take 25 mg by mouth daily.     . fluticasone (FLONASE) 50 MCG/ACT nasal spray Place 2 sprays into both nostrils daily as needed for allergies or rhinitis. Reported on 04/26/2016 18.2 mL 6  . hydrochlorothiazide (HYDRODIURIL) 25 MG tablet Take 1 tablet (25 mg total) by mouth daily. 90 tablet 2  . Insulin Disposable Pump (OMNIPOD DASH 5 PACK PODS) MISC     . minoxidil (LONITEN) 10 MG tablet TAKE 1/2 TABLET BY MOUTH EVERY DAY 45 tablet 2  . omeprazole (PRILOSEC) 40 MG capsule Take 1  capsule (40 mg total) by mouth daily. 30 capsule 3  . spironolactone (ALDACTONE) 50 MG tablet     . valsartan (DIOVAN) 320 MG tablet Take 1 tablet (320 mg total) by mouth daily. 90 tablet 2  . Vitamin D, Ergocalciferol, (DRISDOL) 1.25 MG (50000 UNIT) CAPS capsule Take 1 capsule (50,000 Units total) by mouth every 7 (seven) days. 12 capsule 3   No current facility-administered medications on file prior to visit.   Past Medical History:  Diagnosis Date  . Allergy   . Cancer ([place])    skin, hand  . Cataract    history of surgery bilaterally  . Hypercholesteremia   . Hypertension   . ICAO (internal carotid artery occlusion)   . 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 Stroke [facility]) 02/2016   Past Surgical History:  Procedure Laterality Date  . EYE SURGERY    . HERNIA REPAIR  02/28/2018  . PROSTATE SURGERY    . SPLENECTOMY      Family History  Problem Relation Age of Onset  . Breast cancer Mother   . Diabetes Mellitus II Father   . Breast cancer Sister    Social History   Socioeconomic  History  . Marital status: Married    Spouse name: graye  . Number of children: Not on file  . Years of education: Not on file  . Highest education level: Not on file  Occupational History  . Not on file  Tobacco Use  . Smoking status: Former Smoker    Packs/day: 1.00    Years: 20.00    Pack years: 20.00  . Smokeless tobacco: Never Used  . Tobacco comment: patient states last cigarette 35 years ago  Substance and Sexual Activity  . Alcohol use: No  . Drug use: No  . Sexual activity: Not Currently  Other Topics Concern  . Not on file  Social History Narrative  . Not on file   Social Determinants of Health   Financial Resource Strain:   . Difficulty of Paying Living Expenses: Not on file  Food Insecurity:   . Worried About Charity fundraiser in the Last Year: Not on file  . Ran Out of Food in the Last Year: Not on file  Transportation Needs:   .  Lack of Transportation (Medical): Not on file  . Lack of Transportation (Non-Medical): Not on file  Physical Activity:   . Days of Exercise per Week: Not on file  . Minutes of Exercise per Session: Not on file  Stress:   . Feeling of Stress : Not on file  Social Connections:   . Frequency of Communication with Friends and Family: Not on file  . Frequency of Social Gatherings with Friends and Family: Not on file  . Attends Religious Services: Not on file  . Active Member of Clubs or Organizations: Not on file  . Attends Archivist Meetings: Not on file  . Marital Status: Not on file    Review of Systems  Constitutional: Negative.   Respiratory: Negative.   Cardiovascular: Negative.   Genitourinary: Negative.   Musculoskeletal: Positive for back pain.  Neurological: Positive for weakness.       Pain right upper thigh  Psychiatric/Behavioral: Negative.      Objective:  BP 120/60 (BP Location: Right Arm, Patient Position: Sitting)   Pulse 69   Temp 97.7 F (36.5 C)   Resp 17   Ht 6' (1.829 m)   Wt 205 lb (93 kg)   SpO2 94%   BMI 27.80 kg/m   BP/Weight 06/24/2020 11/08/3084 02/12/8468  Systolic BP 629 528 413  Diastolic BP 60 74 64  Wt. (Lbs) 205 199.96 200  BMI 27.8 27.12 27.12    Physical Exam Vitals reviewed.  Constitutional:      Appearance: Normal appearance.  Eyes:     Extraocular Movements: Extraocular movements intact.     Conjunctiva/sclera: Conjunctivae normal.     Pupils: Pupils are equal, round, and reactive to light.  Cardiovascular:     Rate and Rhythm: Regular rhythm.     Pulses: Normal pulses.     Heart sounds: Normal heart sounds.  Pulmonary:     Effort: Pulmonary effort is normal.     Breath sounds: Normal breath sounds.  Abdominal:     General: Abdomen is flat.     Palpations: Abdomen is soft.  Musculoskeletal:     Comments: Pain in right upper thigh, no pain on ROM hip.  Skin:    General: Skin is warm.  Neurological:      General: No focal deficit present.     Mental Status: He is alert and oriented to person, place, and time. Mental status  is at baseline.       Lab Results  Component Value Date   WBC 6.7 06/16/2020   HGB 14.7 06/16/2020   HCT 44.4 06/16/2020   PLT 318 06/16/2020   GLUCOSE 193 (H) 06/16/2020   CHOL 121 02/27/2016   TRIG 82 02/27/2016   HDL 34 (L) 02/27/2016   LDLCALC 71 02/27/2016   ALT 16 05/06/2020   AST 14 05/06/2020   NA 138 06/16/2020   K 3.9 06/16/2020   CL 104 06/16/2020   CREATININE 1.01 06/16/2020   BUN 19 06/16/2020   CO2 26 06/16/2020   HGBA1C 8.4 (H) 04/05/2020      Assessment & Plan:   1. Lumbar disc disease with radiculopathy - DG Lumbar Spine Complete; Future - MR Lumbar Spine Wo Contrast AN INDIVIDUAL CARE PLAN chronic back pain was established and reinforced today.  The patient's status was assessed using clinical findings on exam, labs, and other diagnostic testing. Patient's success at meeting treatment goals based on disease specific evidence-bassed guidelines and found to be in poor control. RECOMMENDATIONS include maintaining present medicines and treatment. Get x-ray and possible MRI  2. Right hip pain - DG Hip Unilat W OR W/O Pelvis 1V Right Get x-ray right femur with continued pain after hernia surgery.  3. Need for immunization against influenza - Flu Vaccine QUAD High Dose(Fluad)   M79.1 Myalgias from statins   Orders Placed This Encounter  Procedures  . DG Hip Unilat W OR W/O Pelvis 1V Right  . DG Lumbar Spine Complete  . MR Lumbar Spine Wo Contrast  . Flu Vaccine QUAD High Dose(Fluad)     Follow-up: Return in about 2 weeks (around 07/08/2020).  An After Visit Summary was printed and given to the patient.  [place] [phone]

## 2020-06-28 ENCOUNTER — Other Ambulatory Visit: Payer: Self-pay

## 2020-06-28 DIAGNOSIS — M5116 Intervertebral disc disorders with radiculopathy, lumbar region: Secondary | ICD-10-CM

## 2020-06-30 DIAGNOSIS — M542 Cervicalgia: Secondary | ICD-10-CM | POA: Diagnosis not present

## 2020-06-30 DIAGNOSIS — M4807 Spinal stenosis, lumbosacral region: Secondary | ICD-10-CM | POA: Diagnosis not present

## 2020-06-30 DIAGNOSIS — M48061 Spinal stenosis, lumbar region without neurogenic claudication: Secondary | ICD-10-CM | POA: Diagnosis not present

## 2020-06-30 DIAGNOSIS — M5127 Other intervertebral disc displacement, lumbosacral region: Secondary | ICD-10-CM | POA: Diagnosis not present

## 2020-06-30 DIAGNOSIS — M5116 Intervertebral disc disorders with radiculopathy, lumbar region: Secondary | ICD-10-CM | POA: Diagnosis not present

## 2020-06-30 DIAGNOSIS — M47816 Spondylosis without myelopathy or radiculopathy, lumbar region: Secondary | ICD-10-CM | POA: Diagnosis not present

## 2020-07-01 ENCOUNTER — Ambulatory Visit: Payer: Medicare HMO | Admitting: Orthotics

## 2020-07-01 ENCOUNTER — Other Ambulatory Visit: Payer: Self-pay

## 2020-07-01 DIAGNOSIS — M2141 Flat foot [pes planus] (acquired), right foot: Secondary | ICD-10-CM

## 2020-07-01 DIAGNOSIS — M2042 Other hammer toe(s) (acquired), left foot: Secondary | ICD-10-CM

## 2020-07-01 NOTE — Progress Notes (Signed)
Re order shoes 11.5 M  [phone]

## 2020-07-05 ENCOUNTER — Telehealth: Payer: Self-pay

## 2020-07-05 DIAGNOSIS — M5116 Intervertebral disc disorders with radiculopathy, lumbar region: Secondary | ICD-10-CM

## 2020-07-05 NOTE — Telephone Encounter (Signed)
Patient was informed about the results of MRI. Patient is agreed to go to Physical Therapy. Order was put in.

## 2020-07-07 DIAGNOSIS — M545 Low back pain: Secondary | ICD-10-CM | POA: Diagnosis not present

## 2020-07-19 ENCOUNTER — Other Ambulatory Visit: Payer: Self-pay

## 2020-07-19 ENCOUNTER — Ambulatory Visit: Payer: Medicare HMO | Admitting: Orthotics

## 2020-07-19 DIAGNOSIS — M2141 Flat foot [pes planus] (acquired), right foot: Secondary | ICD-10-CM

## 2020-07-19 DIAGNOSIS — M2042 Other hammer toe(s) (acquired), left foot: Secondary | ICD-10-CM

## 2020-07-19 NOTE — Progress Notes (Signed)
Reordered shoes to size larger; inserts ar ein [place]; either I or dr. Cannon Kettle will bring down to [place] when they come  In [employer]

## 2020-07-20 ENCOUNTER — Other Ambulatory Visit: Payer: Medicare HMO | Admitting: Orthotics

## 2020-07-20 DIAGNOSIS — E118 Type 2 diabetes mellitus with unspecified complications: Secondary | ICD-10-CM | POA: Diagnosis not present

## 2020-07-21 ENCOUNTER — Other Ambulatory Visit: Payer: Self-pay | Admitting: Legal Medicine

## 2020-07-25 ENCOUNTER — Encounter: Payer: Self-pay | Admitting: Neurology

## 2020-07-25 ENCOUNTER — Ambulatory Visit: Payer: Medicare HMO | Admitting: Neurology

## 2020-07-25 VITALS — BP 132/80 | HR 69 | Ht 72.0 in | Wt 206.0 lb

## 2020-07-25 DIAGNOSIS — Z8673 Personal history of transient ischemic attack (TIA), and cerebral infarction without residual deficits: Secondary | ICD-10-CM | POA: Diagnosis not present

## 2020-07-25 DIAGNOSIS — G4733 Obstructive sleep apnea (adult) (pediatric): Secondary | ICD-10-CM | POA: Diagnosis not present

## 2020-07-25 DIAGNOSIS — Z789 Other specified health status: Secondary | ICD-10-CM | POA: Diagnosis not present

## 2020-07-25 DIAGNOSIS — I672 Cerebral atherosclerosis: Secondary | ICD-10-CM | POA: Diagnosis not present

## 2020-07-25 NOTE — Progress Notes (Signed)
Subjective:    Patient ID: Mark Stewart is a 74 y.o. male.  HPI     Interim history:   Ms. Fritsch is a 74 year old right-handed gentleman with an underlying medical history of hypertension, hyperlipidemia, status post splenectomy as a teenager, stroke in 2017, diabetes, cataracts, allergies, and overweight state, who presents for presents for Evaluation of his brain atherosclerotic disease and history of stroke.  The patient is accompanied by his wife today and is referred by Dr. [name].  Reviewed his office note from 06/16/2020.  He has had intermittent dizziness.  He has not had any recent falls but did fall backwards in the garage when he turned.  He did not hurt himself thankfully.  This was a few months ago.  His wife reports that sometimes he is not accurate with his medication intake as far as timing goes but he reports that he takes all his medications on a regular basis with the rare exception of forgetting medication.  He has not had any sudden onset of one-sided weakness or numbness or tingling or droopy face or slurring of speech recently.  He was supposed to see a vascular specialist but was told that he did not need any carotid artery surgery as I understand.   At the time of his appointment with Dr. [name] on 06/16/2020 his blood pressure was notably high, 197/105 and he was advised to go to the emergency room after the appointment.  His wife reports that they did go to the emergency room and were there for several hours.  I reviewed the emergency room records from 06/16/2020.  His blood pressure in the ER was 159/123.  He was not given any new medications in the ER.  He was advised to follow-up with his primary care physician for blood pressure management.  Evaluation was requested by Dr. [name] with stroke specialist, [name].  The patient was under the impression that he would meet with the stroke specialist today.  He has not been seen in stroke clinic since [name],  nurse practitioner retired.  I have followed him for sleep apnea.  He was previously followed by the stroke team, particularly [name], nurse practitioner and Dr. [name].   I last saw him on 08/10/2019 for reevaluation of his sleep apnea.  He reported feeling sleepy during the day.  He had not used his CPAP.  He has not used his machine for nearly 2 years.  He was willing to proceed with a sleep study but wanted to wait after the first of the year.    He had a CT angiogram on 05/26/2020 of the head and neck and was found to have abnormal appearance of the right ICA with severe stenosis of the right carotid siphon, 70% or greater.  Mild atherosclerotic disease of the left carotid siphon without significant stenosis.  Atherosclerotic disease at both carotid bifurcations but without stenosis, aortic atherosclerosis.  30% stenosis of the proximal left subclavian artery.  50% stenosis of the left vertebral artery origin.   The patient's allergies, current medications, family history, past medical history, past social history, past surgical history and problem list were reviewed and updated as appropriate.    Previously (copied from previous notes for reference):    I saw him on 05/29/2018, at which time he was compliant with CPAP but had trouble tolerating it.       I first met him at the request of Dr. [name] and [name], NP, on 12/05/2017 at which  time he reported snoring and daytime somnolence. His Epworth sleepiness score was 23 at the time. He was advised to proceed with sleep study testing. He had a baseline sleep study, followed by a CPAP titration study. I went over his test results with him in detail today. His baseline sleep study from 01/15/2018 showed a sleep efficiency of 74.1%, sleep latency 92 minutes, REM latency 87.5 minutes. He had an increased percentage of stage II sleep, REM sleep was mildly reduced at 16.9%, total AHI was 11.1 per hour, REM AHI 9.2 per hour, supine sleep was  not achieved. Average oxygen saturation was 94%, nadir was 86%. Based on his medical history and sleep related complaints he was advised to pursue CPAP therapy. He had a titration study on 02/26/2018. Sleep efficiency was 80.2%, sleep latency 11 minutes, REM latency 67.5 minutes. He was titrated via full facemask from CPAP of 5 cm to 15 cm. On the final pressure his AHI was 0 per hour with supine REM sleep achieved an O2 nadir of 91%. He had mild PLMS with minimal arousals during that study. Based on his test results I prescribed CPAP therapy for home use at a pressure of 15 cm.   I reviewed his CPAP compliance data from 04/28/2018 through 05/27/2018 which is a total of 30 days, during which time he used his CPAP every night with percent used days greater than 4 hours at 70%, indicating adequate compliance with an average usage of 5 hours and 21 minutes, residual AHI at goal at 2.1 per hour, leak on the high side with the 95th percentile at 38.8 L/m on a pressure of 15 cm with EPR of 3.    12/05/2017: (He) reports snoring and excessive daytime somnolence. I reviewed your office note from 10/25/2017. His Epworth sleepiness score is 23 out of 24 today, fatigue score is 41 out of 63. He quit smoking, he does not utilize alcohol currently, drinks caffeine in the form of coffee, typically 1 cup per day and 3 cups of tea per day on average. He does not have that scheduled for his bedtime and rise time. Per wife, he tends to sit in the recliner in the dependent watch TV but he dozes off frequently. He may not come to bed until 1 or 2 AM and sometimes as late as 3 AM, her patient his rise time is around 7 but per wife, he may be in bed until 10 unless she makes him get out of bed. He does not have a formal family history of sleep apnea but suspects that his father could've had it. He had a tonsillectomy as a child. He quit smoking over 30 years ago. He has 20 more is and 4 grandchildren, is retired as an Optometrist.  His wife also recently retired. His weight has been stable. He has nocturia about once or twice per night, denies morning headaches. He denies telltale symptoms of restless leg syndrome. Sometimes he mumbles in his sleep and says a few words per wife's report.     His Past Medical History Is Significant For: Past Medical History:  Diagnosis Date  . Allergy   . Cancer ([place])    skin, hand  . Cataract    history of surgery bilaterally  . Hypercholesteremia   . Hypertension   . ICAO (internal carotid artery occlusion)   . 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 Stroke [facility]) 02/2016    His Past Surgical  History Is Significant For: Past Surgical History:  Procedure Laterality Date  . EYE SURGERY    . HERNIA REPAIR  02/28/2018  . PROSTATE SURGERY    . SPLENECTOMY      His Family History Is Significant For: Family History  Problem Relation Age of Onset  . Breast cancer Mother   . Diabetes Mellitus II Father   . Breast cancer Sister     His Social History Is Significant For: Social History   Socioeconomic History  . Marital status: Married    Spouse name: graye  . Number of children: Not on file  . Years of education: Not on file  . Highest education level: Not on file  Occupational History  . Not on file  Tobacco Use  . Smoking status: Former Smoker    Packs/day: 1.00    Years: 20.00    Pack years: 20.00  . Smokeless tobacco: Never Used  . Tobacco comment: patient states last cigarette 35 years ago  Substance and Sexual Activity  . Alcohol use: No  . Drug use: No  . Sexual activity: Not Currently  Other Topics Concern  . Not on file  Social History Narrative  . Not on file   Social Determinants of Health   Financial Resource Strain:   . Difficulty of Paying Living Expenses: Not on file  Food Insecurity:   . Worried About Charity fundraiser in the Last Year: Not on file  . Ran Out of Food in the Last Year: Not on file   Transportation Needs:   . Lack of Transportation (Medical): Not on file  . Lack of Transportation (Non-Medical): Not on file  Physical Activity:   . Days of Exercise per Week: Not on file  . Minutes of Exercise per Session: Not on file  Stress:   . Feeling of Stress : Not on file  Social Connections:   . Frequency of Communication with Friends and Family: Not on file  . Frequency of Social Gatherings with Friends and Family: Not on file  . Attends Religious Services: Not on file  . Active Member of Clubs or Organizations: Not on file  . Attends Archivist Meetings: Not on file  . Marital Status: Not on file    His Allergies Are:  Allergies  Allergen Reactions  . Benazepril Cough  . Metformin And Related Rash  . Penicillins Rash    Has patient had a PCN reaction causing immediate rash, facial/tongue/throat swelling, SOB or lightheadedness with hypotension: yes Has patient had a PCN reaction causing severe rash involving mucus membranes or skin necrosis: no Has patient had a PCN reaction that required hospitalization: no Has patient had a PCN reaction occurring within the last 10 years: no If all of the above answers are "NO", then may proceed with Cephalosporin use.   :   His Current Medications Are:  Outpatient Encounter Medications as of 07/25/2020  Medication Sig  . albuterol (VENTOLIN HFA) 108 (90 Base) MCG/ACT inhaler Inhale 2 puffs into the lungs every 6 (six) hours as needed.  Marland Kitchen amLODipine (NORVASC) 10 MG tablet Take 1 tablet (10 mg total) by mouth daily.  Marland Kitchen aspirin EC 81 MG tablet Take 81 mg by mouth daily.  Marland Kitchen atorvastatin (LIPITOR) 80 MG tablet TAKE 1 TABLET BY MOUTH EVERY DAY  . BAQSIMI TWO PACK 3 MG/DOSE POWD   . buPROPion (WELLBUTRIN XL) 150 MG 24 hr tablet Take 150 mg by mouth daily.  . carvedilol (COREG) 25 MG  tablet TAKE 2 TABLETS (50 MG TOTAL) BY MOUTH 2 (TWO) TIMES DAILY. CALL FOR APPOINTMENT  . cloNIDine (CATAPRES - DOSED IN MG/24 HR) 0.2 mg/24hr  patch PLACE 1 PATCH (0.2 MG TOTAL) ONTO THE SKIN ONCE A WEEK.  . cyclobenzaprine (FLEXERIL) 10 MG tablet Take 1 tablet (10 mg total) by mouth 3 (three) times daily as needed for muscle spasms.  . empagliflozin (JARDIANCE) 25 MG TABS tablet Take 25 mg by mouth daily.   . fluticasone (FLONASE) 50 MCG/ACT nasal spray Place 2 sprays into both nostrils daily as needed for allergies or rhinitis. Reported on 04/26/2016  . hydrochlorothiazide (HYDRODIURIL) 25 MG tablet Take 1 tablet (25 mg total) by mouth daily.  . Insulin Disposable Pump (OMNIPOD DASH 5 PACK PODS) MISC   . minoxidil (LONITEN) 10 MG tablet TAKE 1/2 TABLET BY MOUTH EVERY DAY  . omeprazole (PRILOSEC) 40 MG capsule Take 1 capsule (40 mg total) by mouth daily.  Marland Kitchen spironolactone (ALDACTONE) 50 MG tablet   . valsartan (DIOVAN) 320 MG tablet Take 1 tablet (320 mg total) by mouth daily.  . Vitamin D, Ergocalciferol, (DRISDOL) 1.25 MG (50000 UNIT) CAPS capsule Take 1 capsule (50,000 Units total) by mouth every 7 (seven) days.   No facility-administered encounter medications on file as of 07/25/2020.  :  Review of Systems:  Out of a complete 14 point review of systems, all are reviewed and negative with the exception of these symptoms as listed below:      Review of Systems  Neurological:       Pt reports he is here to discuss recent CT scan that showed narrowing of blood vessels in the brain. Pt has hx of stroke. Used CPAP in the past stopped back in 2019 after 6 months of usage, could not tolerate and did not feel better after using.     Objective:  Neurological Exam  Physical Exam Physical Examination:   Vitals:   07/25/20 0940  BP: 132/80  Pulse: 69  SpO2: 96%    General Examination: The patient is a very pleasant 74 y.o. male in no acute distress. He appears well-developed and well-nourished and well groomed.   HEENT:Normocephalic, atraumatic, pupils are equal, round and reactive to light and accommodation. He wears  corrective eyeglasses. Face is symmetric, no dysarthria, no facial weakness. No nuchal rigidity. Airway examination reveals adequate dental hygiene, moderate airway crowding, tongue is normal and protrudes centrally, palate elevates symmetrically.   Chest:Clear to auscultation without wheezing, rhonchi or crackles noted.  Heart:S1+S2+0, regular and normal without murmurs, rubs or gallops noted.   Abdomen:Soft, non-tender and non-distended with normal bowel sounds appreciated on auscultation.  Extremities:There istracepitting edema.  Skin: Warm and dry without trophic changes noted.  Musculoskeletal: exam reveals no obvious joint deformities, tenderness or joint swelling or erythema.   Neurologically:  Mental status: The patient is awake, alert and oriented in all 4 spheres.Hisimmediate and remote memory, attention, language skills and fund of knowledge are appropriate. There is no evidence of aphasia, agnosia, apraxia or anomia. Speech is clear with normal prosody and enunciation. Thought process is linear. Mood is normaland affect is normal.  Cranial nerves II - XII are as described above under HEENT exam.  Motor exam: Normal bulk, strength and tone is noted. There is no drift,or tremor.Fine motor skills and coordination: grosslyintact.  Cerebellar testing: No dysmetria or intention tremor. There is no truncal or gait ataxia.  Normal finger-to-nose bilaterally. Sensory exam: intact to light touch in the upper and lower  extremities.  Gait, station and balance:Hestands easily. No veering to one side is noted. No leaning to one side is noted. Posture is age-appropriate and stance is narrow based. Gait showsnormalstride length and normalpace. No problems turning are noted.   Assessmentand Plan:  In summary,Mark Stewart a 23 year oldmalewith an underlying medical history of hypertension, hyperlipidemia, status post splenectomy as a teenager, stroke in 2017,  diabetes, cataracts, allergies, and overweight state, whopresents for  evaluation of his intracranial atherosclerotic disease and intracranial carotid artery stenosis.  He has multiple stroke risk factors.  We talked about these and about the importance of secondary prevention.  He was unhappy during this appointment as he was under the impression that he was to meet with the stroke specialist.  He had been followed by Dr. [name] and after the retirement of our stroke nurse practitioner he had not had any follow-up and while I agree with the evaluation and reestablishing with the stroke team I did have a long appointment with him today and explained secondary stroke prevention.  He has been on cholesterol medication, baby aspirin, and optimal blood pressure management is still key.  He has a history of sleep apnea but has not used his CPAP machine due to intolerance.  We talked about alternative treatment options but I would recommend reevaluation with a home sleep test.  He is agreeable to home sleep test.  He is advised to reestablishing care with the stroke clinic and I made a referral today.  I would help with that Dr. [name] can weigh in and provide additional treatment recommendations and management recommendations for his intracranial atherosclerotic disease.  He is agreeable to the referral and his wife was appreciative as well.  We will call him to schedule his home sleep test.  We talked about potential treatment for sleep apnea as well as inspire procedure as a alternative for patients were intolerant to CPAP therapy.  We will keep him posted as to his home sleep test results as well.  I answered all the questions today and the patient and his wife were in agreement with the plan. I spent 40 minutes in total face-to-face time and in reviewing records during pre-charting, more than 50% of which was spent in counseling and coordination of care, reviewing test results, reviewing medications and treatment  regimen and/or in discussing or reviewing the diagnosis of intracranial carotid artery stenosis, stroke, sleep apnea, the prognosis and treatment options. Pertinent laboratory and imaging test results that were available during this visit with the patient were reviewed by me and considered in my medical decision making (see chart for details).

## 2020-07-25 NOTE — Patient Instructions (Signed)
I am sorry that you feel you are getting the run around.  You are on preventative treatment with blood pressure medication, cholesterol medication and baby aspirin for stroke prevention.  I want you to continue with these medications.  In addition, we will reevaluate your sleep apnea with a home sleep test.  I will make a referral for you to see our stroke specialist as I agree with Dr. [name] that you should re-establish care with a stroke specialist.  I will make another referral for you to see Dr. [name] in our office.

## 2020-07-28 ENCOUNTER — Telehealth: Payer: Self-pay | Admitting: Neurology

## 2020-07-28 NOTE — Telephone Encounter (Signed)
Pt has called to inform Dr Rexene Alberts that he can not been seen here at [facility] before January about a stroke.  Pt is asking if Dr Rexene Alberts can suggest somewhere else for him to go

## 2020-08-01 ENCOUNTER — Encounter: Payer: Self-pay | Admitting: Podiatry

## 2020-08-01 ENCOUNTER — Other Ambulatory Visit: Payer: Self-pay

## 2020-08-01 ENCOUNTER — Ambulatory Visit (INDEPENDENT_AMBULATORY_CARE_PROVIDER_SITE_OTHER): Payer: Medicare HMO | Admitting: Podiatry

## 2020-08-01 DIAGNOSIS — E1142 Type 2 diabetes mellitus with diabetic polyneuropathy: Secondary | ICD-10-CM

## 2020-08-01 DIAGNOSIS — E1169 Type 2 diabetes mellitus with other specified complication: Secondary | ICD-10-CM

## 2020-08-01 DIAGNOSIS — B351 Tinea unguium: Secondary | ICD-10-CM

## 2020-08-01 NOTE — Progress Notes (Signed)
  Subjective:  Patient ID: Mark Stewart, male    DOB: 02-26-1946,  MRN: [id number]  Chief Complaint  Patient presents with  . Nail Problem    trim nails     74 y.o. male presents with the above complaint. History confirmed with patient. Did not get shoes as they needed to be re-ordered. Denies new issues thinks his nail has not grown too much,  Objective:  Physical Exam: warm, good capillary refill, nail exam onychomycosis of the toenails, no trophic changes or ulcerative lesions. DP pulses palpable, PT pulses palpable, protective sensation absent and vibratory sensation diminished Left Foot: normal exam, no swelling, tenderness, instability; ligaments intact, full range of motion of all ankle/foot joints Hammertoes, pes planus Right Foot: normal exam, no swelling, tenderness, instability; ligaments intact, full range of motion of all ankle/foot joints Hammertoes, pes planus  No images are attached to the encounter.  Assessment:   1. Onychomycosis of multiple toenails with type 2 diabetes mellitus and peripheral neuropathy ([place])      Plan:  Patient was evaluated and treated and all questions answered.  Onychomycosis, Diabetes and DPN -Patient is diabetic with a qualifying condition for at risk foot care. -Awaiting DM Shoes   Procedure: Nail Debridement Rationale: Patient meets criteria for routine foot care due to DPN Type of Debridement: manual, sharp debridement. Instrumentation: Nail nipper, rotary burr. Number of Nails: 10     Return in about 3 months (around 11/01/2020) for Diabetic Foot Care.

## 2020-08-02 ENCOUNTER — Other Ambulatory Visit: Payer: Self-pay | Admitting: Cardiovascular Disease

## 2020-08-02 NOTE — Telephone Encounter (Signed)
The patient was followed by the stroke nurse practitioner for 2 years, has not seen the stroke doctor in this clinic. I do believe he would benefit from seeing a stroke specialist.  This was specifically requested by his primary care physician and neurosurgeon.   The patient is very anxious and would like to establish care with a stroke specialist. I advised the patient and his wife that I would make a referral back to the stroke team.

## 2020-08-03 NOTE — Telephone Encounter (Signed)
It seems patient had a stroke to 3 years ago and has remained stable from neurovascular standpoint.  It is okay for him to wait to see me in January unless he has had some more recent TIA/stroke symptoms in that case we will try to work him in sooner

## 2020-08-03 NOTE — Telephone Encounter (Addendum)
Called patient to update him re: Dr Leta Baptist reviewed his chart and advised Dr [name] review and determine if patient needs to be seen sooner than Jan. I stated we will keep Jan appointment and if he needs to be seen sooner, his RN can work patient into schedule. Also talked to wife at patient's request and explained to her. Patient is currently stable, she stated. I advised he continue with medical management of BP, lipids for secondary stroke prevention and will let them know Dr Clydene Fake recommendations. They are aware Dr [name] is working in hospital this week,  verbalized understanding, appreciation.

## 2020-08-04 NOTE — Telephone Encounter (Signed)
Left detailed message of Dr Clydene Fake reply, recommendations. Left # for further questions.

## 2020-08-15 DIAGNOSIS — Z794 Long term (current) use of insulin: Secondary | ICD-10-CM | POA: Insufficient documentation

## 2020-08-15 DIAGNOSIS — E7849 Other hyperlipidemia: Secondary | ICD-10-CM | POA: Diagnosis not present

## 2020-08-15 DIAGNOSIS — E118 Type 2 diabetes mellitus with unspecified complications: Secondary | ICD-10-CM | POA: Diagnosis not present

## 2020-08-16 ENCOUNTER — Telehealth: Payer: Self-pay | Admitting: Podiatry

## 2020-08-16 NOTE — Telephone Encounter (Signed)
Called pt because the shoes he ordered are on back order until jan, I discussed changing the shoes to same style just with the blue trim instead and he said that would be ok. I told pt I would call if any other issues if not he will get a call when shoes come in.

## 2020-08-17 ENCOUNTER — Ambulatory Visit (INDEPENDENT_AMBULATORY_CARE_PROVIDER_SITE_OTHER): Payer: Medicare HMO | Admitting: Neurology

## 2020-08-17 DIAGNOSIS — Z8673 Personal history of transient ischemic attack (TIA), and cerebral infarction without residual deficits: Secondary | ICD-10-CM

## 2020-08-17 DIAGNOSIS — G4733 Obstructive sleep apnea (adult) (pediatric): Secondary | ICD-10-CM | POA: Diagnosis not present

## 2020-08-17 DIAGNOSIS — I672 Cerebral atherosclerosis: Secondary | ICD-10-CM

## 2020-08-17 DIAGNOSIS — Z789 Other specified health status: Secondary | ICD-10-CM

## 2020-08-26 ENCOUNTER — Other Ambulatory Visit: Payer: Self-pay | Admitting: Legal Medicine

## 2020-08-26 DIAGNOSIS — K2100 Gastro-esophageal reflux disease with esophagitis, without bleeding: Secondary | ICD-10-CM

## 2020-08-28 NOTE — Addendum Note (Signed)
Addended by: Star Age on: 08/28/2020 08:37 PM   Modules accepted: Orders

## 2020-08-28 NOTE — Progress Notes (Signed)
Patient referred by Dr. [name] for stroke evaluation, seen by me on 07/25/20, HST on 08/17/20 for re-eval of his OSA.    Please call and notify the patient that the recent home sleep test showed obstructive sleep apnea in the severe range. I recommend treatment in the form of autoPAP, which means, that we don't have to bring him in for a sleep study with CPAP, but will let him start using a so called autoPAP machine at home. The DME representative will educate him on how to use the machine, how to put the mask on, etc. I have placed an order in the chart. Please send referral, talk to patient, send report to referring MD. We will need a FU in sleep clinic for 10 weeks post-PAP set up, please arrange that with me or one of our NPs.  I am not sure, he has his old machine still, or not.  Thanks,   Star Age, MD, PhD [facility])

## 2020-08-28 NOTE — Procedures (Signed)
   [facility]  HOME SLEEP TEST (Watch PAT)  STUDY DATE: 08/17/2020  DOB: Jan 18, 1946  MRN: [id number]  ORDERING CLINICIAN: Star Age, MD, PhD   REFERRING CLINICIAN: Dr. [name]  CLINICAL INFORMATION/HISTORY:  74 year old right-handed gentleman with an underlying medical history of hypertension, hyperlipidemia, status post splenectomy as a teenager, stroke in 2017, diabetes, cataracts, allergies, and overweight state, who presents for presents for re-evaluation of his OSA.  BMI: 28.1 kg/m  FINDINGS:   Total Record Time (hours, min): 9 H 57 min  Total Sleep Time (hours, min):  8 H 29 min   Percent REM (%):    15.5%   Calculated pAHI (per hour):  51.7       REM pAHI:    46.4    NREM pAHI: 52.7   Oxygen Saturation (%) Mean: 94   Minimum oxygen saturation (%):        86   O2 Saturation Range (%): 100-86  O2Saturation (minutes) <=88%: 1.8   Pulse Mean (bpm):    67     IMPRESSION: OSA (obstructive sleep apnea)  RECOMMENDATION:  This home sleep test demonstrates severe obstructive sleep apnea with a total AHI of 51.7/hour and O2 nadir of 86%. Treatment with positive airway pressure is recommended. The patient will be advised to proceed with an autoPAP titration/trial at home for now. A full night titration study may be considered to optimize treatment settings, if needed down the road. Please note that untreated obstructive sleep apnea may carry additional perioperative morbidity. Patients with significant obstructive sleep apnea should receive perioperative PAP therapy and the surgeons and particularly the anesthesiologist should be informed of the diagnosis and the severity of the sleep disordered breathing. The patient should be cautioned not to drive, work at heights, or operate dangerous or heavy equipment when tired or sleepy. Review and reiteration of good sleep hygiene measures should be pursued with any patient. Other causes of the patient's symptoms,  including circadian rhythm disturbances, an underlying mood disorder, medication effect and/or an underlying medical problem cannot be ruled out based on this test. Clinical correlation is recommended. The patient will be notified of the test results. The patient will be seen in follow up in sleep clinic at [facility].  I certify that I have reviewed the raw data recording prior to the issuance of this report in accordance with the standards of the American Academy of Sleep Medicine (AASM).  INTERPRETING PHYSICIAN:    Star Age, MD, PhD  Board Certified in Neurology and Sleep Medicine  [facility] [address] [place], [place] [address] [phone]

## 2020-08-29 ENCOUNTER — Encounter: Payer: Self-pay | Admitting: Legal Medicine

## 2020-08-29 ENCOUNTER — Telehealth: Payer: Self-pay

## 2020-08-29 ENCOUNTER — Other Ambulatory Visit: Payer: Self-pay

## 2020-08-29 ENCOUNTER — Ambulatory Visit (INDEPENDENT_AMBULATORY_CARE_PROVIDER_SITE_OTHER): Payer: Medicare HMO | Admitting: Legal Medicine

## 2020-08-29 VITALS — BP 130/70 | HR 75 | Temp 97.8°F | Ht 73.0 in | Wt 209.0 lb

## 2020-08-29 DIAGNOSIS — N529 Male erectile dysfunction, unspecified: Secondary | ICD-10-CM | POA: Insufficient documentation

## 2020-08-29 DIAGNOSIS — I701 Atherosclerosis of renal artery: Secondary | ICD-10-CM | POA: Insufficient documentation

## 2020-08-29 DIAGNOSIS — Z0000 Encounter for general adult medical examination without abnormal findings: Secondary | ICD-10-CM | POA: Diagnosis not present

## 2020-08-29 LAB — POCT UA - MICROALBUMIN: Microalbumin Ur, POC: 80 mg/L

## 2020-08-29 NOTE — Telephone Encounter (Signed)
I called pt to discuss. No answer, left a message asking him to call me back. 

## 2020-08-29 NOTE — Patient Instructions (Signed)
  Mark Stewart , Thank you for taking time to come for your Medicare Wellness Visit. I appreciate your ongoing commitment to your health goals. Please review the following plan we discussed and let me know if I can assist you in the future.   These are the goals we discussed: Goals    . Blood Pressure < 130/80    . DIET - REDUCE FAT INTAKE       This is a list of the screening recommended for you and due dates:  Health Maintenance  Topic Date Due  .  Hepatitis C: One time screening is recommended by Center for Disease Control  (CDC) for  adults born from [birth year] through 1965.   Never done  . Eye exam for diabetics  Never done  . COVID-19 Vaccine (1) Never done  . Tetanus Vaccine  Never done  . Colon Cancer Screening  Never done  . Pneumonia vaccines (2 of 2 - PPSV23) 07/31/2016  . Hemoglobin A1C  10/05/2020  . Complete foot exam   04/05/2021  . Flu Shot  Completed

## 2020-08-29 NOTE — Telephone Encounter (Signed)
Pt returned call back. Would like a call back.

## 2020-08-29 NOTE — Telephone Encounter (Signed)
-----   Message from Star Age, MD sent at 08/28/2020  8:37 PM EST ----- Patient referred by Dr. [name] for stroke evaluation, seen by me on 07/25/20, HST on 08/17/20 for re-eval of his OSA.    Please call and notify the patient that the recent home sleep test showed obstructive sleep apnea in the severe range. I recommend treatment in the form of autoPAP, which means, that we don't have to bring him in for a sleep study with CPAP, but will let him start using a so called autoPAP machine at home. The DME representative will educate him on how to use the machine, how to put the mask on, etc. I have placed an order in the chart. Please send referral, talk to patient, send report to referring MD. We will need a FU in sleep clinic for 10 weeks post-PAP set up, please arrange that with me or one of our NPs.  I am not sure, he has his old machine still, or not.  Thanks,   Star Age, MD, PhD [facility])

## 2020-08-29 NOTE — Telephone Encounter (Signed)
I called pt again to discuss. No answer, left a message asking him to call me back. 

## 2020-08-29 NOTE — Progress Notes (Signed)
Subjective:  Patient ID: Mark Stewart, male    DOB: 30-Jan-1946  Age: 74 y.o. MRN: [id number]  Chief Complaint  Patient presents with  . Annual Exam    AWV    HPI Encounter for general adult medical examination without abnormal findings  Physical ("At Risk" items are starred): Patient's last physical exam was 1 year ago .   Growth percentile SmartLinks can only be used for patients less than 46 years old.   SDOH Screenings   Alcohol Screen: Low Risk   . Last Alcohol Screening Score (AUDIT): 0  Depression (PHQ2-9): Low Risk   . PHQ-2 Score: 1  Financial Resource Strain: Low Risk   . Difficulty of Paying Living Expenses: Not hard at all  Food Insecurity: No Food Insecurity  . Worried About Charity fundraiser in the Last Year: Never true  . Ran Out of Food in the Last Year: Never true  Housing: Low Risk   . Last Housing Risk Score: 0  Physical Activity:   . Days of Exercise per Week: Not on file  . Minutes of Exercise per Session: Not on file  Social Connections: Moderately Integrated  . Frequency of Communication with Friends and Family: Twice a week  . Frequency of Social Gatherings with Friends and Family: Twice a week  . Attends Religious Services: More than 4 times per year  . Active Member of Clubs or Organizations: No  . Attends Archivist Meetings: Never  . Marital Status: Married  Stress: No Stress Concern Present  . Feeling of Stress : Not at all  Tobacco Use: Medium Risk  . Smoking Tobacco Use: Former Smoker  . Smokeless Tobacco Use: Never Used  Transportation Needs: No Transportation Needs  . Lack of Transportation (Medical): No  . Lack of Transportation (Non-Medical): No    Fall Risk  08/29/2020 06/24/2020 03/28/2020 05/29/2018 10/25/2017  Falls in the past year? 1 1 0 No No  Number falls in past yr: 0 1 0 - -  Injury with Fall? 0 0 0 - -  Risk for fall due to : No Fall Risks History of fall(s);Impaired balance/gait - - -  Follow up - Falls  evaluation completed Falls evaluation completed - -    Depression screen [facility] 2/9 08/29/2020 03/10/2020 11/13/2016 06/18/2016 06/06/2016  Decreased Interest 0 0 3 3 3   Down, Depressed, Hopeless 1 0 1 3 1   PHQ - 2 Score 1 0 4 6 4   Altered sleeping - - 3 2 3   Tired, decreased energy - - 1 2 2   Change in appetite - - 0 0 0  Feeling bad or failure about yourself  - - 3 2 2   Trouble concentrating - - 0 1 1  Moving slowly or fidgety/restless - - 0 - 1  Suicidal thoughts - - 0 1 0  PHQ-9 Score - - 11 14 13   Difficult doing work/chores - - Very difficult Very difficult Very difficult    Functional Status Survey: Is the patient deaf or have difficulty hearing?: No Does the patient have difficulty seeing, even when wearing glasses/contacts?: No Does the patient have difficulty concentrating, remembering, or making decisions?: No Does the patient have difficulty walking or climbing stairs?: No Does the patient have difficulty dressing or bathing?: No Does the patient have difficulty doing errands alone such as visiting a doctor's office or shopping?: No   Safety: reviewed ;  Patient wears a seat belt. Patient's home has smoke detectors and carbon  monoxide detectors. Patient practices appropriate gun safety Patient wears sunscreen with extended sun exposure. Dental Care: biannual cleanings, brushes and flosses daily. Ophthalmology/Optometry: Annual visit.  Hearing loss: none Vision impairments: none Patient is not afflicted from Stress Incontinence and Urge Incontinence   Current Outpatient Medications on File Prior to Visit  Medication Sig Dispense Refill  . albuterol (VENTOLIN HFA) 108 (90 Base) MCG/ACT inhaler Inhale 2 puffs into the lungs every 6 (six) hours as needed. 18 g 5  . amLODipine (NORVASC) 10 MG tablet Take 1 tablet (10 mg total) by mouth daily. 90 tablet 2  . aspirin EC 81 MG tablet Take 81 mg by mouth daily.    Marland Kitchen atorvastatin (LIPITOR) 80 MG tablet TAKE 1 TABLET BY MOUTH EVERY  DAY 90 tablet 2  . BAQSIMI TWO PACK 3 MG/DOSE POWD     . buPROPion (WELLBUTRIN XL) 150 MG 24 hr tablet Take 150 mg by mouth daily.    . carvedilol (COREG) 25 MG tablet TAKE 2 TABLETS (50 MG TOTAL) BY MOUTH 2 (TWO) TIMES DAILY. CALL FOR APPOINTMENT 360 tablet 1  . cloNIDine (CATAPRES - DOSED IN MG/24 HR) 0.2 mg/24hr patch PLACE 1 PATCH (0.2 MG TOTAL) ONTO THE SKIN ONCE A WEEK. 12 patch 2  . cyclobenzaprine (FLEXERIL) 10 MG tablet Take 1 tablet (10 mg total) by mouth 3 (three) times daily as needed for muscle spasms. 30 tablet 2  . empagliflozin (JARDIANCE) 25 MG TABS tablet Take 25 mg by mouth daily.     . fluticasone (FLONASE) 50 MCG/ACT nasal spray Place 2 sprays into both nostrils daily as needed for allergies or rhinitis. Reported on 04/26/2016 18.2 mL 6  . hydrochlorothiazide (HYDRODIURIL) 25 MG tablet Take 1 tablet (25 mg total) by mouth daily. 90 tablet 2  . Insulin Disposable Pump (OMNIPOD DASH 5 PACK PODS) MISC     . minoxidil (LONITEN) 10 MG tablet TAKE 1/2 TABLET BY MOUTH EVERY DAY 45 tablet 2  . NOVOLOG 100 UNIT/ML injection     . omeprazole (PRILOSEC) 40 MG capsule TAKE 1 CAPSULE BY MOUTH EVERY DAY 90 capsule 2  . spironolactone (ALDACTONE) 50 MG tablet     . valsartan (DIOVAN) 320 MG tablet Take 1 tablet (320 mg total) by mouth daily. 90 tablet 2  . Vitamin D, Ergocalciferol, (DRISDOL) 1.25 MG (50000 UNIT) CAPS capsule Take 1 capsule (50,000 Units total) by mouth every 7 (seven) days. 12 capsule 3   No current facility-administered medications on file prior to visit.    Social Hx   Social History   Socioeconomic History  . Marital status: Married    Spouse name: graye  . Number of children: Not on file  . Years of education: Not on file  . Highest education level: Not on file  Occupational History  . Not on file  Tobacco Use  . Smoking status: Former Smoker    Packs/day: 1.00    Years: 20.00    Pack years: 20.00  . Smokeless tobacco: Never Used  . Tobacco comment:  patient states last cigarette 35 years ago  Substance and Sexual Activity  . Alcohol use: No  . Drug use: No  . Sexual activity: Not Currently  Other Topics Concern  . Not on file  Social History Narrative  . Not on file   Social Determinants of Health   Financial Resource Strain: Low Risk   . Difficulty of Paying Living Expenses: Not hard at all  Food Insecurity: No Food Insecurity  .  Worried About Charity fundraiser in the Last Year: Never true  . Ran Out of Food in the Last Year: Never true  Transportation Needs: No Transportation Needs  . Lack of Transportation (Medical): No  . Lack of Transportation (Non-Medical): No  Physical Activity:   . Days of Exercise per Week: Not on file  . Minutes of Exercise per Session: Not on file  Stress: No Stress Concern Present  . Feeling of Stress : Not at all  Social Connections: Moderately Integrated  . Frequency of Communication with Friends and Family: Twice a week  . Frequency of Social Gatherings with Friends and Family: Twice a week  . Attends Religious Services: More than 4 times per year  . Active Member of Clubs or Organizations: No  . Attends Archivist Meetings: Never  . Marital Status: Married   Past Medical History:  Diagnosis Date  . Allergy   . Cancer ([place])    skin, hand  . Cataract    history of surgery bilaterally  . Hypercholesteremia   . Hypertension   . ICAO (internal carotid artery occlusion)   . 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 Stroke [facility]) 02/2016   Family History  Problem Relation Age of Onset  . Breast cancer Mother   . Diabetes Mellitus II Father   . Breast cancer Sister     Review of Systems  Constitutional: Negative for activity change, appetite change and fatigue.  HENT: Negative for congestion, dental problem, rhinorrhea and sinus pain.   Eyes: Negative for visual disturbance.  Respiratory: Negative for cough, choking, shortness of breath and  stridor.   Cardiovascular: Negative for chest pain and leg swelling.  Gastrointestinal: Negative.   Endocrine: Negative.   Genitourinary: Negative.   Musculoskeletal: Positive for arthralgias.  Skin: Negative.   Neurological: Negative.  Negative for syncope and weakness.  Psychiatric/Behavioral: Negative.      Objective:  BP 130/70 (BP Location: Left Arm, Patient Position: Sitting, Cuff Size: Normal)   Pulse 75   Temp 97.8 F (36.6 C) (Temporal)   Ht 6\' 1"  (1.854 m)   Wt 209 lb (94.8 kg)   SpO2 96%   BMI 27.57 kg/m   BP/Weight 08/29/2020 07/25/2020 2/95/1884  Systolic BP 166 063 016  Diastolic BP 70 80 60  Wt. (Lbs) 209 206 205  BMI 27.57 27.94 27.8    Physical Exam na  Lab Results  Component Value Date   WBC 6.7 06/16/2020   HGB 14.7 06/16/2020   HCT 44.4 06/16/2020   PLT 318 06/16/2020   GLUCOSE 193 (H) 06/16/2020   CHOL 121 02/27/2016   TRIG 82 02/27/2016   HDL 34 (L) 02/27/2016   LDLCALC 71 02/27/2016   ALT 16 05/06/2020   AST 14 05/06/2020   NA 138 06/16/2020   K 3.9 06/16/2020   CL 104 06/16/2020   CREATININE 1.01 06/16/2020   BUN 19 06/16/2020   CO2 26 06/16/2020   HGBA1C 8.4 (H) 04/05/2020   MICROALBUR 80 08/29/2020      Assessment & Plan:  1. Routine general medical examination at a health care facility  routine counselign with living will andimmunizations.    These are the goals we discussed: Goals    . Blood Pressure < 130/80    . DIET - REDUCE FAT INTAKE        This is a list of the screening recommended for you and due dates:  Health  Maintenance  Topic Date Due  .  Hepatitis C: One time screening is recommended by Center for Disease Control  (CDC) for  adults born from [birth year] through 1965.   Never done  . Eye exam for diabetics  Never done  . COVID-19 Vaccine (1) Never done  . Tetanus Vaccine  Never done  . Colon Cancer Screening  Never done  . Pneumonia vaccines (2 of 2 - PPSV23) 07/31/2016  . Hemoglobin A1C  10/05/2020  .  Complete foot exam   04/05/2021  . Flu Shot  Completed      AN INDIVIDUALIZED CARE PLAN: was established or reinforced today.   SELF MANAGEMENT: The patient and I together assessed ways to personally work towards obtaining the recommended goals  Support needs The patient and/or family needs were assessed and services were offered and not necessary at this time.    Follow-up: Return in about 3 months (around 11/29/2020).  Reinaldo Meeker, MD [facility] [phone]

## 2020-08-30 NOTE — Telephone Encounter (Signed)
I called pt again to discuss. No answer, left a message asking him to call me back. 

## 2020-08-31 DIAGNOSIS — E118 Type 2 diabetes mellitus with unspecified complications: Secondary | ICD-10-CM | POA: Diagnosis not present

## 2020-08-31 NOTE — Telephone Encounter (Signed)
I called pt. I advised pt that Dr. Rexene Alberts reviewed their sleep study results and found that pt has severe osa. Dr. Rexene Alberts recommends that pt start an auto pap at home. I reviewed PAP compliance expectations with the pt. Pt is agreeable to starting an auto-PAP. I advised pt that an order will be sent to a DME, [facility], and [facility] will call the pt within about one week after they file with the pt's insurance. [facility] will show the pt how to use the machine, fit for masks, and troubleshoot the auto-PAP if needed. A follow up appt was made for insurance purposes with Janett Billow, NP on 12/26/20 at 10:15am. Pt verbalized understanding to arrive 15 minutes early and bring their auto-PAP. A letter with all of this information in it will be mailed to the pt as a reminder. I verified with the pt that the address we have on file is correct. Pt verbalized understanding of results. Pt had no questions at this time but was encouraged to call back if questions arise. I have sent the order to [facility] and have received confirmation that they have received the order.

## 2020-09-06 ENCOUNTER — Ambulatory Visit: Payer: Medicare HMO | Admitting: Neurology

## 2020-09-06 ENCOUNTER — Other Ambulatory Visit: Payer: Self-pay | Admitting: Legal Medicine

## 2020-09-06 ENCOUNTER — Encounter: Payer: Self-pay | Admitting: Neurology

## 2020-09-06 ENCOUNTER — Telehealth: Payer: Self-pay | Admitting: Podiatry

## 2020-09-06 ENCOUNTER — Other Ambulatory Visit: Payer: Self-pay

## 2020-09-06 VITALS — BP 142/63 | HR 69 | Ht 72.0 in | Wt 214.5 lb

## 2020-09-06 DIAGNOSIS — R42 Dizziness and giddiness: Secondary | ICD-10-CM | POA: Diagnosis not present

## 2020-09-06 DIAGNOSIS — I6521 Occlusion and stenosis of right carotid artery: Secondary | ICD-10-CM | POA: Diagnosis not present

## 2020-09-06 DIAGNOSIS — I6990 Unspecified sequelae of unspecified cerebrovascular disease: Secondary | ICD-10-CM | POA: Diagnosis not present

## 2020-09-06 NOTE — Progress Notes (Signed)
[facility] [address] [place]. [place] [address] [phone]       OFFICE CONSULT NOTE  Mark Stewart Date of Birth:  1946-04-02 Medical Record Number:  [id number]   Referring MD: Star Age Reason for Referral: Dizziness and carotid stenosis  HPI: Mark Stewart is a 74 year old Caucasian male seen today for initial office consultation visit for dizziness and carotid stenosis.  History is obtained from the patient, review of referral notes and electronic medical records and I personally reviewed pertinent available imaging films in PACS.  He has past medical history of diabetes, hypertension, hyperlipidemia, s/p penectomy, right thalamic lacunar infarct in May 2017, cataracts, allergies, obesity and recently diagnosed obstructive sleep apnea.  Patient was recently seen by primary care physician for symptoms of intermittent dizziness.  He describes this as a subjective feeling of imbalance and head feeling heavy this is triggered by certain neck movements and is more prominent when he is in the shower.  He denies true vertigo.  He feels better if he sits down for few minutes.  This feeling is usually transient but occasionally may last longer.  He has longstanding history of chronic tinnitus in both ears as well as decreased hearing but denies vertigo.  He was recently referred to Dr. [name] who referred him to neurology.  Patient states he does have mild residual left-sided weakness and some numbness from his previous stroke and has to walk slowly.  He denies any gait imbalance falls injuries.  He denies any focal recent symptoms suggestive of stroke or TIA.  He did underwent MRI scan of the brain on 05/16/2020 at [facility] which shows his old right thalamic lacunar infarct and diminished flow void of the right internal carotid artery in the skull base.  This was followed by CT angiogram of the brain and neck which showed 70% right carotid siphon stenosis and mild  left carotid siphon stenosis with 50% stenosis at the left vertebral artery origin.  Patient however denies any symptoms of right hemispheric ischemia recently in the form of slurred speech left-sided weakness numbness. He recently saw Dr. Rexene Alberts who ordered a overnight sleep home study which was strongly positive for obstructive sleep apnea and he will have follow-up testing and treatment started soon after insurance approval. ROS:   14 system review of systems is positive for dizziness, blurred vision, fuzziness, tinnitus, decreased hearing and all other systems negative  PMH:  Past Medical History:  Diagnosis Date  . Allergy   . Cancer ([place])    skin, hand  . Cataract    history of surgery bilaterally  . Hypercholesteremia   . Hypertension   . ICAO (internal carotid artery occlusion)   . 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 Stroke [facility]) 02/2016    Social History:  Social History   Socioeconomic History  . Marital status: Married    Spouse name: Mark Stewart  . Number of children: Not on file  . Years of education: Not on file  . Highest education level: Not on file  Occupational History  . Not on file  Tobacco Use  . Smoking status: Former Smoker    Packs/day: 1.00    Years: 20.00    Pack years: 20.00  . Smokeless tobacco: Never Used  . Tobacco comment: patient states last cigarette 35 years ago  Substance and Sexual Activity  . Alcohol use: No  . Drug use: No  . Sexual activity: Not  Currently  Other Topics Concern  . Not on file  Social History Narrative   Lives with wife   Right Handed   Drinks 4-5 cups caffeine daily   Social Determinants of Health   Financial Resource Strain: Low Risk   . Difficulty of Paying Living Expenses: Not hard at all  Food Insecurity: No Food Insecurity  . Worried About Charity fundraiser in the Last Year: Never true  . Ran Out of Food in the Last Year: Never true  Transportation Needs: No Transportation  Needs  . Lack of Transportation (Medical): No  . Lack of Transportation (Non-Medical): No  Physical Activity:   . Days of Exercise per Week: Not on file  . Minutes of Exercise per Session: Not on file  Stress: No Stress Concern Present  . Feeling of Stress : Not at all  Social Connections: Moderately Integrated  . Frequency of Communication with Friends and Family: Twice a week  . Frequency of Social Gatherings with Friends and Family: Twice a week  . Attends Religious Services: More than 4 times per year  . Active Member of Clubs or Organizations: No  . Attends Archivist Meetings: Never  . Marital Status: Married  Human resources officer Violence: Not At Risk  . Fear of Current or Ex-Partner: No  . Emotionally Abused: No  . Physically Abused: No  . Sexually Abused: No    Medications:   Current Outpatient Medications on File Prior to Visit  Medication Sig Dispense Refill  . albuterol (VENTOLIN HFA) 108 (90 Base) MCG/ACT inhaler Inhale 2 puffs into the lungs every 6 (six) hours as needed. 18 g 5  . amLODipine (NORVASC) 10 MG tablet Take 1 tablet (10 mg total) by mouth daily. 90 tablet 2  . aspirin EC 81 MG tablet Take 81 mg by mouth daily.    Marland Kitchen atorvastatin (LIPITOR) 80 MG tablet TAKE 1 TABLET BY MOUTH EVERY DAY 90 tablet 2  . BAQSIMI TWO PACK 3 MG/DOSE POWD     . buPROPion (WELLBUTRIN XL) 150 MG 24 hr tablet Take 150 mg by mouth daily.    . carvedilol (COREG) 25 MG tablet TAKE 2 TABLETS (50 MG TOTAL) BY MOUTH 2 (TWO) TIMES DAILY. CALL FOR APPOINTMENT 360 tablet 1  . cloNIDine (CATAPRES - DOSED IN MG/24 HR) 0.2 mg/24hr patch PLACE 1 PATCH (0.2 MG TOTAL) ONTO THE SKIN ONCE A WEEK. 12 patch 2  . cyclobenzaprine (FLEXERIL) 10 MG tablet Take 1 tablet (10 mg total) by mouth 3 (three) times daily as needed for muscle spasms. 30 tablet 2  . empagliflozin (JARDIANCE) 25 MG TABS tablet Take 25 mg by mouth daily.     . fluticasone (FLONASE) 50 MCG/ACT nasal spray Place 2 sprays into both  nostrils daily as needed for allergies or rhinitis. Reported on 04/26/2016 18.2 mL 6  . hydrochlorothiazide (HYDRODIURIL) 25 MG tablet Take 1 tablet (25 mg total) by mouth daily. 90 tablet 2  . Insulin Disposable Pump (OMNIPOD DASH 5 PACK PODS) MISC     . minoxidil (LONITEN) 10 MG tablet TAKE 1/2 TABLET BY MOUTH EVERY DAY 45 tablet 2  . NOVOLOG 100 UNIT/ML injection     . omeprazole (PRILOSEC) 40 MG capsule TAKE 1 CAPSULE BY MOUTH EVERY DAY 90 capsule 2  . spironolactone (ALDACTONE) 50 MG tablet     . valsartan (DIOVAN) 320 MG tablet Take 1 tablet (320 mg total) by mouth daily. 90 tablet 2  . Vitamin D, Ergocalciferol, (DRISDOL) 1.25  MG (50000 UNIT) CAPS capsule Take 1 capsule (50,000 Units total) by mouth every 7 (seven) days. 12 capsule 3   No current facility-administered medications on file prior to visit.    Allergies:   Allergies  Allergen Reactions  . Benazepril Cough  . Metformin And Related Rash  . Penicillins Rash    Has patient had a PCN reaction causing immediate rash, facial/tongue/throat swelling, SOB or lightheadedness with hypotension: yes Has patient had a PCN reaction causing severe rash involving mucus membranes or skin necrosis: no Has patient had a PCN reaction that required hospitalization: no Has patient had a PCN reaction occurring within the last 10 years: no If all of the above answers are "NO", then may proceed with Cephalosporin use.     Physical Exam General: well developed, well nourished elderly Caucasian male, seated, in no evident distress Head: head normocephalic and atraumatic.   Neck: supple with no carotid or supraclavicular bruits Cardiovascular: regular rate and rhythm, no murmurs Musculoskeletal: no deformity Skin:  no rash/petichiae Vascular:  Normal pulses all extremities  Neurologic Exam Mental Status: Awake and fully alert. Oriented to place and time. Recent and remote memory intact. Attention span, concentration and fund of knowledge  appropriate. Mood and affect appropriate.  Cranial Nerves: Fundoscopic exam reveals sharp disc margins. Pupils equal, briskly reactive to light. Extraocular movements full without nystagmus. Visual fields full to confrontation. Hearing mildly diminished bilaterally. Facial sensation intact. Face, tongue, palate moves normally and symmetrically.  Motor: Normal bulk and tone. Normal strength in all tested extremity muscles except mild left grip weakness and diminished fine finger movements on the left.  Orbits right over left upper extremity.. Sensory.: intact to touch , pinprick , but diminished position and vibratory sensation from ankle down bilaterally..  Coordination: Rapid alternating movements normal in all extremities. Finger-to-nose and heel-to-shin performed accurately bilaterally.  Fukuda stepping test is positive with the patient moving off base and rotating to the left.  Headshaking produces subjective dizziness but no nystagmus.  Hallpike maneuver not done. Gait and Station: Arises from chair without difficulty. Stance is normal. Gait demonstrates normal stride length and balance . Able to heel, toe and tandem walk with with mild  difficulty.  Reflexes: 1+ and symmetric except ankle jerks are depressed. Toes downgoing.   NIHSS  0 Modified Rankin  1   ASSESSMENT: 74 year old Caucasian male with symptoms of dizziness likely multifactorial due to combination of peripheral vestibular dysfunction and diabetic neuropathy and cerebrovascular disease.  Remote history of right thalamic infarct in May 2017 with recent neurovascular imaging suggesting severe right carotid siphon stenosis which is asymptomatic.  Vascular risk factors of hypertension, diabetes, hyperlipidemia, carotid stenosis, newly diagnosed obstructive sleep apnea and cerebrovascular disease.     PLAN: I had a long discussion the patient and his wife regarding his recent complaints of intermittent dizziness and imbalance which  appears to be positional likely due to combination of peripheral vestibular dysfunction and underlying mild diabetic peripheral neuropathy.  He also discussed CT angiogram findings suggestive of severe right carotid siphon stenosis which appears to be asymptomatic and can be medically managed at this point.  I recommend continue aspirin for stroke prevention and aggressive risk factor modification with strict control of hypertension with blood pressure goal below 130/90, diabetes with hemoglobin A1c goal below 6.5% and lipids with LDL cholesterol goal below 70 mg percent.  I also recommend he eat a healthy diet and be active and exercise regularly and not gain weight.  Refer to  physical therapy for vestibular stabilization exercises.  He is continue follow-up with Dr. Rexene Alberts for his new diagnosis of obstructive sleep apnea.  Greater than 50% time during this 45-minute consultation visit was spent in counseling and coordination of care about her dizziness and right carotid siphon stenosis and answering questions.  No schedule appointment with me is necessary but may return in the future as needed only. Antony Contras, MD Note: This document was prepared with digital dictation and possible smart phrase technology. Any transcriptional errors that result from this process are unintentional.

## 2020-09-06 NOTE — Telephone Encounter (Signed)
Received call from Gaastra at pts pcp office ([facility]) she said that Dr Henrene Pastor did not treat pts diabetes and will not sign the diabetic shoe paperwork because he has no office visit notes stating he treats pts diabetes. He said pt goes to [facility].  I called them and they said pt is seen by PA but Dr Amalia Greenhouse is overseeing md for the PA. I have faxed paperwork to them.

## 2020-09-06 NOTE — Patient Instructions (Signed)
I had a long discussion the patient and his wife regarding his recent complaints of intermittent dizziness and imbalance which appears to be positional likely due to combination of peripheral vestibular dysfunction and underlying mild diabetic peripheral neuropathy.  He also discussed CT angiogram findings suggestive of severe right carotid siphon stenosis which appears to be asymptomatic and can be medically managed at this point.  I recommend continue aspirin for stroke prevention and aggressive risk factor modification with strict control of hypertension with blood pressure goal below 130/90, diabetes with hemoglobin A1c goal below 6.5% and lipids with LDL cholesterol goal below 70 mg percent.  I also recommend he eat a healthy diet and be active and exercise regularly and not gain weight.  Refer to physical therapy for vestibular stabilization exercises.  He is continue follow-up with Dr. Rexene Alberts for his sleep apnea.  No schedule appointment with me is necessary but may return in the future as needed only.  Dizziness Dizziness is a common problem. It makes you feel unsteady or light-headed. You may feel like you are about to pass out (faint). Dizziness can lead to getting hurt if you stumble or fall. Dizziness can be caused by many things, including:  Medicines.  Not having enough water in your body (dehydration).  Illness. Follow these instructions at home: Eating and drinking   Drink enough fluid to keep your pee (urine) clear or pale yellow. This helps to keep you from getting dehydrated. Try to drink more clear fluids, such as water.  Do not drink alcohol.  Limit how much caffeine you drink or eat, if your doctor tells you to do that.  Limit how much salt (sodium) you drink or eat, if your doctor tells you to do that. Activity   Avoid making quick movements. ? When you stand up from sitting in a chair, steady yourself until you feel okay. ? In the morning, first sit up on the side of  the bed. When you feel okay, stand slowly while you hold onto something. Do this until you know that your balance is fine.  If you need to stand in one place for a long time, move your legs often. Tighten and relax the muscles in your legs while you are standing.  Do not drive or use heavy machinery if you feel dizzy.  Avoid bending down if you feel dizzy. Place items in your home so you can reach them easily without leaning over. Lifestyle  Do not use any products that contain nicotine or tobacco, such as cigarettes and e-cigarettes. If you need help quitting, ask your doctor.  Try to lower your stress level. You can do this by using methods such as yoga or meditation. Talk with your doctor if you need help. General instructions  Watch your dizziness for any changes.  Take over-the-counter and prescription medicines only as told by your doctor. Talk with your doctor if you think that you are dizzy because of a medicine that you are taking.  Tell a friend or a family member that you are feeling dizzy. If he or she notices any changes in your behavior, have this person call your doctor.  Keep all follow-up visits as told by your doctor. This is important. Contact a doctor if:  Your dizziness does not go away.  Your dizziness or light-headedness gets worse.  You feel sick to your stomach (nauseous).  You have trouble hearing.  You have new symptoms.  You are unsteady on your feet.  You feel  like the room is spinning. Get help right away if:  You throw up (vomit) or have watery poop (diarrhea), and you cannot eat or drink anything.  You have trouble: ? Talking. ? Walking. ? Swallowing. ? Using your arms, hands, or legs.  You feel generally weak.  You are not thinking clearly, or you have trouble forming sentences. A friend or family member may notice this.  You have: ? Chest pain. ? Pain in your belly (abdomen). ? Shortness of breath. ? Sweating.  Your vision  changes.  You are bleeding.  You have a very bad headache.  You have neck pain or a stiff neck.  You have a fever. These symptoms may be an emergency. Do not wait to see if the symptoms will go away. Get medical help right away. Call your local emergency services (911 in the [place] Do not drive yourself to the hospital. Summary  Dizziness makes you feel unsteady or light-headed. You may feel like you are about to pass out (faint).  Drink enough fluid to keep your pee (urine) clear or pale yellow. Do not drink alcohol.  Avoid making quick movements if you feel dizzy.  Watch your dizziness for any changes. This information is not intended to replace advice given to you by your health care provider. Make sure you discuss any questions you have with your health care provider. Document Revised: 09/27/2017 Document Reviewed: 10/11/2016 Elsevier Patient Education  [place].

## 2020-09-08 DIAGNOSIS — R2689 Other abnormalities of gait and mobility: Secondary | ICD-10-CM | POA: Diagnosis not present

## 2020-09-12 ENCOUNTER — Telehealth: Payer: Self-pay

## 2020-09-12 NOTE — Telephone Encounter (Signed)
Use heat on rest, set up another mammogram lp

## 2020-09-12 NOTE — Telephone Encounter (Signed)
Dr. Henrene Pastor please advise.  Pt called and stated that he had soreness and a lump in right breast an was sent for a mammogram and everything was clear and good however now he is stating he has the same problem in the left breast and he wasn't sure if you wanted him to come in to be seen for this or to go ahead an have a mammogram set up.

## 2020-09-13 ENCOUNTER — Other Ambulatory Visit: Payer: Self-pay

## 2020-09-13 DIAGNOSIS — L570 Actinic keratosis: Secondary | ICD-10-CM | POA: Diagnosis not present

## 2020-09-13 DIAGNOSIS — R600 Localized edema: Secondary | ICD-10-CM | POA: Diagnosis not present

## 2020-09-13 DIAGNOSIS — Z85820 Personal history of malignant melanoma of skin: Secondary | ICD-10-CM | POA: Diagnosis not present

## 2020-09-13 DIAGNOSIS — I8310 Varicose veins of unspecified lower extremity with inflammation: Secondary | ICD-10-CM | POA: Diagnosis not present

## 2020-09-14 DIAGNOSIS — R2689 Other abnormalities of gait and mobility: Secondary | ICD-10-CM | POA: Diagnosis not present

## 2020-09-16 ENCOUNTER — Other Ambulatory Visit: Payer: Self-pay

## 2020-09-16 ENCOUNTER — Ambulatory Visit (INDEPENDENT_AMBULATORY_CARE_PROVIDER_SITE_OTHER): Payer: Medicare HMO | Admitting: Legal Medicine

## 2020-09-16 ENCOUNTER — Encounter: Payer: Self-pay | Admitting: Legal Medicine

## 2020-09-16 VITALS — BP 142/60 | HR 68 | Temp 97.2°F | Resp 16 | Ht 72.0 in | Wt 217.8 lb

## 2020-09-16 DIAGNOSIS — E559 Vitamin D deficiency, unspecified: Secondary | ICD-10-CM | POA: Diagnosis not present

## 2020-09-16 DIAGNOSIS — K2100 Gastro-esophageal reflux disease with esophagitis, without bleeding: Secondary | ICD-10-CM

## 2020-09-16 DIAGNOSIS — F331 Major depressive disorder, recurrent, moderate: Secondary | ICD-10-CM

## 2020-09-16 DIAGNOSIS — E1142 Type 2 diabetes mellitus with diabetic polyneuropathy: Secondary | ICD-10-CM | POA: Diagnosis not present

## 2020-09-16 DIAGNOSIS — E782 Mixed hyperlipidemia: Secondary | ICD-10-CM

## 2020-09-16 DIAGNOSIS — G4733 Obstructive sleep apnea (adult) (pediatric): Secondary | ICD-10-CM | POA: Diagnosis not present

## 2020-09-16 DIAGNOSIS — I701 Atherosclerosis of renal artery: Secondary | ICD-10-CM

## 2020-09-16 DIAGNOSIS — M5116 Intervertebral disc disorders with radiculopathy, lumbar region: Secondary | ICD-10-CM

## 2020-09-16 DIAGNOSIS — R270 Ataxia, unspecified: Secondary | ICD-10-CM

## 2020-09-16 DIAGNOSIS — Z23 Encounter for immunization: Secondary | ICD-10-CM | POA: Diagnosis not present

## 2020-09-16 DIAGNOSIS — I69352 Hemiplegia and hemiparesis following cerebral infarction affecting left dominant side: Secondary | ICD-10-CM

## 2020-09-16 DIAGNOSIS — N63 Unspecified lump in unspecified breast: Secondary | ICD-10-CM

## 2020-09-16 DIAGNOSIS — I10 Essential (primary) hypertension: Secondary | ICD-10-CM

## 2020-09-16 NOTE — Progress Notes (Signed)
Subjective:  Patient ID: Mark Stewart, male    DOB: 01/14/1946  Age: 74 y.o. MRN: [id number]  Chief Complaint  Patient presents with  . Hyperlipidemia  . Hypertension    HPI: chronic visit  Patient present with type 2 diabetes.  Specifically, this is type 2, insulin requiring diabetes, complicated by polyneuropthy.  Compliance with treatment has been good; patient take medicines as directed, maintains diet and exercise regimen, follows up as directed, and is keeping glucose diary.  Date of  diagnosis 2010.  Depression screen has been performed.Tobacco screen nonsmoker. Current medicines for diabetes insulin pump.  Patient is on valsrtan for renal protection and atorvastatin for cholesterol control.  Patient performs foot exams daily and last ophthalmologic exam was yes.  Breast lump on left breast 2 weeks he had mammogram for a right breast mass that was benign.  Patient presents for follow up of hypertension.  Patient tolerating valsartan, carvidiol, HCTZ, catapress patch well with side effects.  Patient was diagnosed with hypertension 2010 so has been treated for hypertension for 10 years.Patient is working on maintaining diet and exercise regimen and follows up as directed. Complication include none.   Current Outpatient Medications on File Prior to Visit  Medication Sig Dispense Refill  . albuterol (VENTOLIN HFA) 108 (90 Base) MCG/ACT inhaler Inhale 2 puffs into the lungs every 6 (six) hours as needed. 18 g 5  . amLODipine (NORVASC) 10 MG tablet Take 1 tablet (10 mg total) by mouth daily. 90 tablet 2  . aspirin EC 81 MG tablet Take 81 mg by mouth daily.    Marland Kitchen atorvastatin (LIPITOR) 80 MG tablet TAKE 1 TABLET BY MOUTH EVERY DAY 90 tablet 2  . BAQSIMI TWO PACK 3 MG/DOSE POWD     . buPROPion (WELLBUTRIN XL) 150 MG 24 hr tablet Take 150 mg by mouth daily.    . carvedilol (COREG) 25 MG tablet TAKE 2 TABLETS (50 MG TOTAL) BY MOUTH 2 (TWO) TIMES DAILY. CALL FOR APPOINTMENT 360 tablet 1  .  cloNIDine (CATAPRES - DOSED IN MG/24 HR) 0.2 mg/24hr patch PLACE 1 PATCH (0.2 MG TOTAL) ONTO THE SKIN ONCE A WEEK. 12 patch 2  . cyclobenzaprine (FLEXERIL) 10 MG tablet Take 1 tablet (10 mg total) by mouth 3 (three) times daily as needed for muscle spasms. 30 tablet 2  . empagliflozin (JARDIANCE) 25 MG TABS tablet Take 25 mg by mouth daily.     . fluticasone (FLONASE) 50 MCG/ACT nasal spray Place 2 sprays into both nostrils daily as needed for allergies or rhinitis. Reported on 04/26/2016 18.2 mL 6  . hydrochlorothiazide (HYDRODIURIL) 25 MG tablet Take 1 tablet (25 mg total) by mouth daily. 90 tablet 2  . Insulin Disposable Pump (OMNIPOD DASH 5 PACK PODS) MISC     . minoxidil (LONITEN) 10 MG tablet TAKE 1/2 TABLET BY MOUTH EVERY DAY 45 tablet 2  . NOVOLOG 100 UNIT/ML injection     . omeprazole (PRILOSEC) 40 MG capsule TAKE 1 CAPSULE BY MOUTH EVERY DAY 90 capsule 2  . spironolactone (ALDACTONE) 50 MG tablet     . valsartan (DIOVAN) 320 MG tablet Take 1 tablet (320 mg total) by mouth daily. 90 tablet 2  . Vitamin D, Ergocalciferol, (DRISDOL) 1.25 MG (50000 UNIT) CAPS capsule Take 1 capsule (50,000 Units total) by mouth every 7 (seven) days. 12 capsule 3   No current facility-administered medications on file prior to visit.   Past Medical History:  Diagnosis Date  . Allergy   .  Cancer ([place])    skin, hand  . Cataract    history of surgery bilaterally  . Hypercholesteremia   . Hypertension   . ICAO (internal carotid artery occlusion)   . 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 Stroke [facility]) 02/2016   Past Surgical History:  Procedure Laterality Date  . EYE SURGERY    . HERNIA REPAIR  02/28/2018  . HERNIA REPAIR Bilateral 11/2019  . PROSTATE SURGERY    . SPLENECTOMY      Family History  Problem Relation Age of Onset  . Breast cancer Mother   . Diabetes Mellitus II Father   . Breast cancer Sister    Social History   Socioeconomic History  . Marital  status: Married    Spouse name: graye  . Number of children: Not on file  . Years of education: Not on file  . Highest education level: Not on file  Occupational History  . Not on file  Tobacco Use  . Smoking status: Former Smoker    Packs/day: 1.00    Years: 20.00    Pack years: 20.00  . Smokeless tobacco: Never Used  . Tobacco comment: patient states last cigarette 35 years ago  Substance and Sexual Activity  . Alcohol use: No  . Drug use: No  . Sexual activity: Not Currently  Other Topics Concern  . Not on file  Social History Narrative   Lives with wife   Right Handed   Drinks 4-5 cups caffeine daily   Social Determinants of Health   Financial Resource Strain: Low Risk   . Difficulty of Paying Living Expenses: Not hard at all  Food Insecurity: No Food Insecurity  . Worried About Charity fundraiser in the Last Year: Never true  . Ran Out of Food in the Last Year: Never true  Transportation Needs: No Transportation Needs  . Lack of Transportation (Medical): No  . Lack of Transportation (Non-Medical): No  Physical Activity: Not on file  Stress: No Stress Concern Present  . Feeling of Stress : Not at all  Social Connections: Moderately Integrated  . Frequency of Communication with Friends and Family: Twice a week  . Frequency of Social Gatherings with Friends and Family: Twice a week  . Attends Religious Services: More than 4 times per year  . Active Member of Clubs or Organizations: No  . Attends Archivist Meetings: Never  . Marital Status: Married    Review of Systems  Constitutional: Negative for activity change, diaphoresis and fatigue.  HENT: Negative for congestion, sinus pain and sore throat.   Eyes: Negative for visual disturbance.  Respiratory: Negative for apnea, cough and wheezing.   Cardiovascular: Positive for chest pain. Negative for palpitations and leg swelling.       Left breast pain  Gastrointestinal: Positive for abdominal  distention. Negative for abdominal pain.  Endocrine: Negative for polyuria.  Genitourinary: Negative for difficulty urinating and dysuria.  Musculoskeletal: Negative.   Neurological: Positive for dizziness. Negative for weakness and headaches.  Psychiatric/Behavioral: Negative.      Objective:  BP (!) 142/60 (BP Location: Right Arm, Patient Position: Sitting, Cuff Size: Normal)   Pulse 68   Temp (!) 97.2 F (36.2 C) (Temporal)   Resp 16   Ht 6' (1.829 m)   Wt 217 lb 12.8 oz (98.8 kg)   SpO2 98%   BMI 29.54 kg/m   BP/Weight 09/16/2020 09/06/2020 43/15/4008  Systolic BP 676  357 017  Diastolic BP 60 63 70  Wt. (Lbs) 217.8 214.5 209  BMI 29.54 29.09 27.57    Physical Exam Vitals reviewed.  Constitutional:      Appearance: He is not ill-appearing or toxic-appearing.  HENT:     Right Ear: Tympanic membrane, ear canal and external ear normal.     Left Ear: Tympanic membrane, ear canal and external ear normal.     Mouth/Throat:     Mouth: Mucous membranes are moist.     Pharynx: Oropharynx is clear.  Eyes:     Extraocular Movements: Extraocular movements intact.     Conjunctiva/sclera: Conjunctivae normal.     Pupils: Pupils are equal, round, and reactive to light.  Cardiovascular:     Rate and Rhythm: Normal rate and regular rhythm.     Pulses: Normal pulses.     Heart sounds: Normal heart sounds.  Pulmonary:     Effort: Pulmonary effort is normal.     Breath sounds: Normal breath sounds. No wheezing.     Comments: Left breast lump Chest:  Breasts:     Right: Normal. No axillary adenopathy or supraclavicular adenopathy.     Left: Swelling, mass and tenderness present. No axillary adenopathy or supraclavicular adenopathy.     Abdominal:     General: Abdomen is flat. Bowel sounds are normal.     Palpations: Abdomen is soft.  Musculoskeletal:        General: Normal range of motion.  Lymphadenopathy:     Upper Body:     Right upper body: No supraclavicular,  axillary or pectoral adenopathy.     Left upper body: No supraclavicular or axillary adenopathy.  Skin:    General: Skin is warm.     Capillary Refill: Capillary refill takes less than 2 seconds.     Findings: Lesion present. No erythema.  Neurological:     General: No focal deficit present.     Mental Status: He is alert and oriented to person, place, and time. Mental status is at baseline.    Depression screen [facility] 2/9 08/29/2020 03/10/2020 11/13/2016 06/18/2016 06/06/2016  Decreased Interest 0 0 3 3 3   Down, Depressed, Hopeless 1 0 1 3 1   PHQ - 2 Score 1 0 4 6 4   Altered sleeping - - 3 2 3   Tired, decreased energy - - 1 2 2   Change in appetite - - 0 0 0  Feeling bad or failure about yourself  - - 3 2 2   Trouble concentrating - - 0 1 1  Moving slowly or fidgety/restless - - 0 - 1  Suicidal thoughts - - 0 1 0  PHQ-9 Score - - 11 14 13   Difficult doing work/chores - - Very difficult Very difficult Very difficult       Lab Results  Component Value Date   WBC 9.3 09/16/2020   HGB 14.7 09/16/2020   HCT 43.1 09/16/2020   PLT 291 09/16/2020   GLUCOSE 349 (H) 09/16/2020   CHOL 140 09/16/2020   TRIG 133 09/16/2020   HDL 37 (L) 09/16/2020   LDLCALC 79 09/16/2020   ALT 24 09/16/2020   AST 20 09/16/2020   NA 136 09/16/2020   K 4.3 09/16/2020   CL 99 09/16/2020   CREATININE 1.01 09/16/2020   BUN 15 09/16/2020   CO2 22 09/16/2020   HGBA1C 9.0 (H) 09/16/2020   MICROALBUR 80 08/29/2020      Assessment & Plan:   1. Obstructive sleep apnea AN INDIVIDUAL  CARE PLAN OSA was established and reinforced today.  The patient's status was assessed using clinical findings on exam, labs, and other diagnostic testing. Patient's success at meeting treatment goals based on disease specific evidence-bassed guidelines and found to be in good control. RECOMMENDATIONS include maintaining present medicines and treatment.  2. Gastroesophageal reflux disease with esophagitis without hemorrhage Plan  of care was formulated today.  he is doing well.  A plan of care was formulated using patient exam, tests and other sources to optimize care using evidence based information.  Recommend no smoking, no eating after supper, avoid fatty foods, elevate Head of bed, avoid tight fitting clothing.  Continue on OTC.  3. Diabetic polyneuropathy associated with type 2 diabetes mellitus (HCC) - Hemoglobin A1c An individual care plan for diabetes was established and reinforced today.  The patient's status was assessed using clinical findings on exam, labs and diagnostic testing. Patient success at meeting goals based on disease specific evidence-based guidelines and found to be good controlled. Medications were assessed and patient's understanding of the medical issues , including barriers were assessed. Recommend adherence to a diabetic diet, a graduated exercise program, HgbA1c level is checked quarterly, and urine microalbumin performed yearly .  Annual mono-filament sensation testing performed. Lower blood pressure and control hyperlipidemia is important. Get annual eye exams and annual flu shots and smoking cessation discussed.  Self management goals were discussed.  4. Essential hypertension - CBC with Differential/Platelet - Comprehensive metabolic panel An individual hypertension care plan was established and reinforced today.  The patient's status was assessed using clinical findings on exam and labs or diagnostic tests. The patient's success at meeting treatment goals on disease specific evidence-based guidelines and found to be well controlled. SELF MANAGEMENT: The patient and I together assessed ways to personally work towards obtaining the recommended goals. RECOMMENDATIONS: avoid decongestants found in common cold remedies, decrease consumption of alcohol, perform routine monitoring of BP with home BP cuff, exercise, reduction of dietary salt, take medicines as prescribed, try not to miss doses and  quit smoking.  Regular exercise and maintaining a healthy weight is needed.  Stress reduction may help. A CLINICAL SUMMARY including written plan identify barriers to care unique to individual due to social or financial issues.  We attempt to mutually creat solutions for individual and family understanding.  5. Hypovitaminosis D - VITAMIN D 25 Hydroxy (Vit-D Deficiency, Fractures) AN INDIVIDUAL CARE PLAN vitamin D was established and reinforced today.  The patient's status was assessed using clinical findings on exam, labs, and other diagnostic testing. Patient's success at meeting treatment goals based on disease specific evidence-bassed guidelines and found to be in good control. RECOMMENDATIONS include maintaining present medicines and treatment.  6. Lumbar disc disease with radiculopathy AN INDIVIDUAL CARE PLAN lumbar pain was established and reinforced today.  The patient's status was assessed using clinical findings on exam, labs, and other diagnostic testing. Patient's success at meeting treatment goals based on disease specific evidence-bassed guidelines and found to be in fair control. RECOMMENDATIONS include maintaining present medicines and treatment.  7. Ataxia He is walking better after his stroke but he remains unstable  8. Mixed hyperlipidemia - Lipid panel AN INDIVIDUAL CARE PLAN for hyperlipidemia/ cholesterol was established and reinforced today.  The patient's status was assessed using clinical findings on exam, lab and other diagnostic tests. The patient's disease status was assessed based on evidence-based guidelines and found to be well controlled. MEDICATIONS were reviewed. SELF MANAGEMENT GOALS have been discussed and patient's success  at attaining the goal of low cholesterol was assessed. RECOMMENDATION given include regular exercise 3 days a week and low cholesterol/low fat diet. CLINICAL SUMMARY including written plan to identify barriers unique to the patient due to  social or economic  reasons was discussed.  9. Major depressive disorder, recurrent episode, moderate ([place]) Patient's depression is controlled with none.   Anhedonia better.  PHQ 9 was performed score 0. An individual care plan was established or reinforced today.  The patient's disease status was assessed using clinical findings on exam, labs, and or other diagnostic testing to determine patient's success in meeting treatment goals based on disease specific evidence-based guidelines and found to be improving Recommendations include stay off medicines  10. Hemiplegia and hemiparesis following cerebral infarction affecting left dominant side [facility]) Patient was evaluated using information from exam, tests and other diagnostic studies to perform evidence-based treatment for this disorder.  Opimizing treatment and improvement of neurologic deficits.  Patient is using cane to maintain as much independence as possible. Patient using cane.  Patient has full ability to perform ADLs.  11. Breast mass in male Patient has a new mass in left breast and is tender, needs mammogram, mammogram set up  12. Need for prophylactic vaccination against Streptococcus pneumoniae (pneumococcus) - Pneumococcal polysaccharide vaccine 23-valent greater than or equal to 2yo subcutaneous/IM  13. Atherosclerosis of renal artery [facility]) Patient has renal atherosclerosis that is followed by Cardiology      Orders Placed This Encounter  Procedures  . Pneumococcal polysaccharide vaccine 23-valent greater than or equal to 2yo subcutaneous/IM  . VITAMIN D 25 Hydroxy (Vit-D Deficiency, Fractures)  . CBC with Differential/Platelet  . Hemoglobin A1c  . Lipid panel  . Comprehensive metabolic panel  . CBC with Differential/Platelet  . Comprehensive metabolic panel  . Lipid panel  . Hemoglobin A1c  . Cardiovascular Risk Assessment      I spent 30 minutes dedicated to the care of this patient on the date of this encounter to  include face-to-face time with the patient, as well as: review of immunization from [place] and managing a new problem with new breast mass  Follow-up: Return in about 6 months (around 03/17/2021) for fasting.  An After Visit Summary was printed and given to the patient.  Reinaldo Meeker, MD [facility] [phone]

## 2020-09-17 LAB — LIPID PANEL
Chol/HDL Ratio: 3.8 ratio (ref 0.0–5.0)
Cholesterol, Total: 140 mg/dL (ref 100–199)
HDL: 37 mg/dL — ABNORMAL LOW (ref >39)
LDL Chol Calc (NIH): 79 mg/dL (ref 0–99)
Triglycerides: 133 mg/dL (ref 0–149)
VLDL Cholesterol Cal: 24 mg/dL (ref 5–40)

## 2020-09-17 LAB — CBC WITH DIFFERENTIAL/PLATELET
Basophils Absolute: 0 10*3/uL (ref 0.0–0.2)
Basos: 0 %
EOS (ABSOLUTE): 0.4 10*3/uL (ref 0.0–0.4)
Eos: 4 %
Hematocrit: 43.1 % (ref 37.5–51.0)
Hemoglobin: 14.7 g/dL (ref 13.0–17.7)
Immature Grans (Abs): 0 10*3/uL (ref 0.0–0.1)
Immature Granulocytes: 0 %
Lymphocytes Absolute: 1.2 10*3/uL (ref 0.7–3.1)
Lymphs: 13 %
MCH: 32.2 pg (ref 26.6–33.0)
MCHC: 34.1 g/dL (ref 31.5–35.7)
MCV: 94 fL (ref 79–97)
Monocytes Absolute: 0.5 10*3/uL (ref 0.1–0.9)
Monocytes: 6 %
Neutrophils Absolute: 7.1 10*3/uL — ABNORMAL HIGH (ref 1.4–7.0)
Neutrophils: 77 %
Platelets: 291 10*3/uL (ref 150–450)
RBC: 4.57 x10E6/uL (ref 4.14–5.80)
RDW: 11.8 % (ref 11.6–15.4)
WBC: 9.3 10*3/uL (ref 3.4–10.8)

## 2020-09-17 LAB — COMPREHENSIVE METABOLIC PANEL
ALT: 24 IU/L (ref 0–44)
AST: 20 IU/L (ref 0–40)
Albumin/Globulin Ratio: 1.3 (ref 1.2–2.2)
Albumin: 3.8 g/dL (ref 3.7–4.7)
Alkaline Phosphatase: 97 IU/L (ref 44–121)
BUN/Creatinine Ratio: 15 (ref 10–24)
BUN: 15 mg/dL (ref 8–27)
Bilirubin Total: 0.5 mg/dL (ref 0.0–1.2)
CO2: 22 mmol/L (ref 20–29)
Calcium: 9 mg/dL (ref 8.6–10.2)
Chloride: 99 mmol/L (ref 96–106)
Creatinine, Ser: 1.01 mg/dL (ref 0.76–1.27)
GFR calc Af Amer: 84 mL/min/{1.73_m2} (ref >59)
GFR calc non Af Amer: 73 mL/min/{1.73_m2} (ref >59)
Globulin, Total: 3 g/dL (ref 1.5–4.5)
Glucose: 349 mg/dL — ABNORMAL HIGH (ref 65–99)
Potassium: 4.3 mmol/L (ref 3.5–5.2)
Sodium: 136 mmol/L (ref 134–144)
Total Protein: 6.8 g/dL (ref 6.0–8.5)

## 2020-09-17 LAB — VITAMIN D 25 HYDROXY (VIT D DEFICIENCY, FRACTURES): Vit D, 25-Hydroxy: 19.9 ng/mL — ABNORMAL LOW (ref 30.0–100.0)

## 2020-09-17 LAB — HEMOGLOBIN A1C
Est. average glucose Bld gHb Est-mCnc: 212 mg/dL
Hgb A1c MFr Bld: 9 % — ABNORMAL HIGH (ref 4.8–5.6)

## 2020-09-17 LAB — CARDIOVASCULAR RISK ASSESSMENT

## 2020-09-17 NOTE — Progress Notes (Signed)
Cardiology Office Note   Date:  09/19/2020   ID:  BANE HAGY, DOB May 03, 1946, MRN [id number]  PCP:  Lillard Anes, MD  Cardiologist:  Sanda Klein, MD EP: None  Chief Complaint  Patient presents with  . Follow-up    hypertension      History of Present Illness: Mark Stewart is a 74 y.o. male with a PMH of HTN, HLD, DM type 2, and CVA in 2017, who presents for routine follow-up.  He was last evaluated by cardiology at an outpatient visit with Dr. Sallyanne Kuster 01/2020, at which time he was without significant cardiac complaints. BP was elevated, however he reported some occasional hypotension at home with orthostatic symptoms. His spironolactone was decreased to 25mg  daily and he was continued on amlodipine, valsartan, carvedilol, minoxidil, and HCTZ.Marland Kitchen He was recommended to follow-up in 6 months. His last echocardiogram in 2017 showed EF 55-60%, mild LV dilation, G1DD, and no significant valvular abnormalities.   He presents today for routine follow-up of his HTN. He is here today with this wife. Since his last visit he underwent a home sleep study to evaluate daytime somnolence complaints. He was told he would require CPAP therapy, however this has not been started yet. He continues to sleep frequently during the day. Wife reports ongoing apathy since his CVA. Patient states he knows he needs to be more active but he has trouble forcing himself to do anything. He reports chronic LE edema for which he was recently recommended to use compression stockings, though did not put them on today. He also has had gait instability and recently started what sounds like vestibular therapy a couple weeks ago, thankfully no syncope or falls. He otherwise has no complaints of chest pain, SOB, DOE, palpitations, orthopnea, or PND.   Past Medical History:  Diagnosis Date  . Allergy   . Cancer ([place])    skin, hand  . Cataract    history of surgery bilaterally  . Hypercholesteremia   .  Hypertension   . ICAO (internal carotid artery occlusion)   . 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 Stroke [facility]) 02/2016    Past Surgical History:  Procedure Laterality Date  . EYE SURGERY    . HERNIA REPAIR  02/28/2018  . HERNIA REPAIR Bilateral 11/2019  . PROSTATE SURGERY    . SPLENECTOMY       Current Outpatient Medications  Medication Sig Dispense Refill  . albuterol (VENTOLIN HFA) 108 (90 Base) MCG/ACT inhaler Inhale 2 puffs into the lungs every 6 (six) hours as needed. 18 g 5  . amLODipine (NORVASC) 10 MG tablet Take 1 tablet (10 mg total) by mouth daily. 90 tablet 2  . aspirin EC 81 MG tablet Take 81 mg by mouth daily.    Marland Kitchen atorvastatin (LIPITOR) 80 MG tablet TAKE 1 TABLET BY MOUTH EVERY DAY 90 tablet 2  . BAQSIMI TWO PACK 3 MG/DOSE POWD     . buPROPion (WELLBUTRIN XL) 150 MG 24 hr tablet Take 150 mg by mouth daily.    . carvedilol (COREG) 25 MG tablet TAKE 2 TABLETS (50 MG TOTAL) BY MOUTH 2 (TWO) TIMES DAILY. CALL FOR APPOINTMENT 360 tablet 1  . cloNIDine (CATAPRES - DOSED IN MG/24 HR) 0.2 mg/24hr patch PLACE 1 PATCH (0.2 MG TOTAL) ONTO THE SKIN ONCE A WEEK. 12 patch 2  . cyclobenzaprine (FLEXERIL) 10 MG tablet Take 1 tablet (10 mg total) by mouth 3 (three)  times daily as needed for muscle spasms. 30 tablet 2  . empagliflozin (JARDIANCE) 25 MG TABS tablet Take 25 mg by mouth daily.     . fluticasone (FLONASE) 50 MCG/ACT nasal spray Place 2 sprays into both nostrils daily as needed for allergies or rhinitis. Reported on 04/26/2016 18.2 mL 6  . hydrochlorothiazide (HYDRODIURIL) 25 MG tablet Take 1 tablet (25 mg total) by mouth daily. 90 tablet 2  . Insulin Disposable Pump (OMNIPOD DASH 5 PACK PODS) MISC     . minoxidil (LONITEN) 10 MG tablet TAKE 1/2 TABLET BY MOUTH EVERY DAY 45 tablet 2  . NOVOLOG 100 UNIT/ML injection     . omeprazole (PRILOSEC) 40 MG capsule TAKE 1 CAPSULE BY MOUTH EVERY DAY 90 capsule 2  . spironolactone (ALDACTONE) 50 MG  tablet     . valsartan (DIOVAN) 320 MG tablet Take 1 tablet (320 mg total) by mouth daily. 90 tablet 2  . Vitamin D, Ergocalciferol, (DRISDOL) 1.25 MG (50000 UNIT) CAPS capsule Take 1 capsule (50,000 Units total) by mouth every 7 (seven) days. 12 capsule 3   No current facility-administered medications for this visit.    Allergies:   Benazepril, Metformin and related, and Penicillins    Social History:  The patient  reports that he has quit smoking. He has a 20.00 pack-year smoking history. He has never used smokeless tobacco. He reports that he does not drink alcohol and does not use drugs.   Family History:  The patient's  family history includes Breast cancer in his mother and sister; Diabetes Mellitus II in his father.    ROS:  Please see the history of present illness.   Otherwise, review of systems are positive for none.   All other systems are reviewed and negative.    PHYSICAL EXAM: VS:  BP 130/76   Pulse (!) 58   Ht 6' (1.829 m)   Wt 217 lb 9.6 oz (98.7 kg)   BMI 29.51 kg/m  , BMI Body mass index is 29.51 kg/m. GEN: Well nourished, well developed, in no acute distress HEENT: sclera anicteric Neck: no JVD, carotid bruits, or masses Cardiac: RRR; no murmurs, rubs, or gallops, 1-2+ LE edema  Respiratory:  clear to auscultation bilaterally, normal work of breathing GI: soft, nontender, nondistended, + BS MS: no deformity or atrophy Skin: warm and dry, no rash Neuro:  Strength and sensation are intact Psych: euthymic mood, full affect   EKG:  EKG is not ordered today.    Recent Labs: 05/06/2020: Magnesium 2.1 09/16/2020: ALT 24; BUN 15; Creatinine, Ser 1.01; Hemoglobin 14.7; Platelets 291; Potassium 4.3; Sodium 136    Lipid Panel    Component Value Date/Time   CHOL 140 09/16/2020 0957   TRIG 133 09/16/2020 0957   HDL 37 (L) 09/16/2020 0957   CHOLHDL 3.8 09/16/2020 0957   CHOLHDL 3.6 02/27/2016 0536   VLDL 16 02/27/2016 0536   LDLCALC 79 09/16/2020 0957       Wt Readings from Last 3 Encounters:  09/19/20 217 lb 9.6 oz (98.7 kg)  09/16/20 217 lb 12.8 oz (98.8 kg)  09/06/20 214 lb 8 oz (97.3 kg)      Other studies Reviewed: Additional studies/ records that were reviewed today include:   Echocardiogram 2017: - Left ventricle: The cavity size was mildly dilated. Wall  thickness was normal. Systolic function was normal. The estimated  ejection fraction was in the range of 55% to 60%. Doppler  parameters are consistent with abnormal left ventricular  relaxation (grade 1 diastolic dysfunction).  - Mitral valve: Calcified annulus. Mildly thickened leaflets .      ASSESSMENT AND PLAN:  1. HTN: longstanding history of difficult to control HTN despite numerous medications. BP 130/76 today. He reports not monitoring his blood pressure regularly at home lately. - Continue amlodipine, carvedilol, HCTZ, minoxidil, spironolactone, and valsartan  2. HLD: LDL 79 09/16/20 - Continue atorvastatin  3: DM type 2: A1C poorly controlled at 9.0 09/16/20 - Continue jardiance and insulin - Patient follows with endocrinology q69mo  4. CVA: no change in neurologic status - Continue aspirin and statin   Current medicines are reviewed at length with the patient today.  The patient does not have concerns regarding medicines.  The following changes have been made:  As above  Labs/ tests ordered today include:  No orders of the defined types were placed in this encounter.    Disposition:   FU with Dr. Sallyanne Kuster in 6 months  Signed, Abigail Butts, PA-C  09/19/2020 2:19 PM

## 2020-09-18 NOTE — Progress Notes (Signed)
Vitamin d 19.9 low, continue vitamin D every 2 weeks to raise level, CBC normal, glucose 349 very high, kidney tests ok, liver tests normal, Cholesterol normal, A1c 9.0 very high- need dietary consultation and need to  use medicines regularly. lp

## 2020-09-19 ENCOUNTER — Encounter: Payer: Self-pay | Admitting: Medical

## 2020-09-19 ENCOUNTER — Telehealth: Payer: Self-pay

## 2020-09-19 ENCOUNTER — Encounter: Payer: Self-pay | Admitting: Legal Medicine

## 2020-09-19 ENCOUNTER — Ambulatory Visit (INDEPENDENT_AMBULATORY_CARE_PROVIDER_SITE_OTHER): Payer: Medicare HMO | Admitting: Medical

## 2020-09-19 ENCOUNTER — Other Ambulatory Visit: Payer: Self-pay

## 2020-09-19 VITALS — BP 130/76 | HR 58 | Ht 72.0 in | Wt 217.6 lb

## 2020-09-19 DIAGNOSIS — I6350 Cerebral infarction due to unspecified occlusion or stenosis of unspecified cerebral artery: Secondary | ICD-10-CM

## 2020-09-19 DIAGNOSIS — E1165 Type 2 diabetes mellitus with hyperglycemia: Secondary | ICD-10-CM | POA: Diagnosis not present

## 2020-09-19 DIAGNOSIS — E7800 Pure hypercholesterolemia, unspecified: Secondary | ICD-10-CM

## 2020-09-19 DIAGNOSIS — I10 Essential (primary) hypertension: Secondary | ICD-10-CM | POA: Diagnosis not present

## 2020-09-19 NOTE — Patient Instructions (Signed)
Medication Instructions:  Your physician recommends that you continue on your current medications as directed. Please refer to the Current Medication list given to you today.  *If you need a refill on your cardiac medications before your next appointment, please call your pharmacy*  Lab Work: NONE ordered at this time of appointment   If you have labs (blood work) drawn today and your tests are completely normal, you will receive your results only by: . MyChart Message (if you have MyChart) OR . A paper copy in the mail If you have any lab test that is abnormal or we need to change your treatment, we will call you to review the results.  Testing/Procedures: NONE ordered at this time of appointment   Follow-Up: At [facility], you and your health needs are our priority.  As part of our continuing mission to provide you with exceptional heart care, we have created designated Provider Care Teams.  These Care Teams include your primary Cardiologist (physician) and Advanced Practice Providers (APPs -  Physician Assistants and Nurse Practitioners) who all work together to provide you with the care you need, when you need it.  We recommend signing up for the patient portal called "MyChart".  Sign up information is provided on this After Visit Summary.  MyChart is used to connect with patients for Virtual Visits (Telemedicine).  Patients are able to view lab/test results, encounter notes, upcoming appointments, etc.  Non-urgent messages can be sent to your provider as well.   To learn more about what you can do with MyChart, go to https://www.mychart.com.    Your next appointment:   6 month(s)  The format for your next appointment:   In Person  Provider:   Mihai Croitoru, MD  Other Instructions   

## 2020-09-19 NOTE — Telephone Encounter (Signed)
Received this notice from [facility]: "He's in my stack to be processed. I did send a text message informing him that we got the order, but I don't know if it went thru as he did not respond."

## 2020-09-19 NOTE — Progress Notes (Signed)
TY

## 2020-09-19 NOTE — Telephone Encounter (Signed)
Pt called, have not received a call from [facility] to schedule an appt.

## 2020-09-19 NOTE — Telephone Encounter (Signed)
Patient notified of lab results. Patient states he will think about seeing a dietician but he is not interested at this time.

## 2020-09-19 NOTE — Telephone Encounter (Signed)
I have reached out to [facility] to find out what is going on.

## 2020-09-19 NOTE — Telephone Encounter (Signed)
I called pt. I discussed this with him. He will call [facility] if he has not heard from them within a week.

## 2020-09-22 ENCOUNTER — Telehealth: Payer: Self-pay

## 2020-09-22 DIAGNOSIS — R2689 Other abnormalities of gait and mobility: Secondary | ICD-10-CM | POA: Diagnosis not present

## 2020-09-22 NOTE — Telephone Encounter (Signed)
Please call patient and ask him if he has started his AutoPap yet after his last home sleep test.  If he has started AutoPap, please arrange for a follow-up appointment to discuss his symptoms, compliance and a referral to ENT for evaluation for inspire.

## 2020-09-22 NOTE — Telephone Encounter (Signed)
Patient has called with questions about Dawna Part and wants to talk to Dr Rexene Alberts about this. Please advise patient of next steps.

## 2020-09-22 NOTE — Telephone Encounter (Signed)
He has not started the CPAP for this time yet. He has in the past though.

## 2020-09-26 ENCOUNTER — Telehealth: Payer: Self-pay | Admitting: Neurology

## 2020-09-26 NOTE — Telephone Encounter (Signed)
This is a duplicate phone call.

## 2020-09-26 NOTE — Telephone Encounter (Signed)
I called pt. He has not started auto-pap. Since he has severe osa, he should start auto-pap first. He may not qualify for Inspire because of his severe osa. He is agreeable to starting auto-pap and following up as scheduled in March of 2022.

## 2020-09-26 NOTE — Telephone Encounter (Signed)
Pt called, have not heard anything about inspire sleeping system. Would like a call from the nurse.

## 2020-09-28 DIAGNOSIS — R2689 Other abnormalities of gait and mobility: Secondary | ICD-10-CM | POA: Diagnosis not present

## 2020-09-29 ENCOUNTER — Ambulatory Visit (INDEPENDENT_AMBULATORY_CARE_PROVIDER_SITE_OTHER): Payer: Medicare HMO | Admitting: Podiatry

## 2020-09-29 ENCOUNTER — Telehealth: Payer: Self-pay

## 2020-09-29 ENCOUNTER — Other Ambulatory Visit: Payer: Self-pay

## 2020-09-29 DIAGNOSIS — M2042 Other hammer toe(s) (acquired), left foot: Secondary | ICD-10-CM

## 2020-09-29 DIAGNOSIS — M2141 Flat foot [pes planus] (acquired), right foot: Secondary | ICD-10-CM

## 2020-09-29 DIAGNOSIS — M2142 Flat foot [pes planus] (acquired), left foot: Secondary | ICD-10-CM

## 2020-09-29 DIAGNOSIS — M2041 Other hammer toe(s) (acquired), right foot: Secondary | ICD-10-CM

## 2020-09-29 NOTE — Progress Notes (Signed)
Patient presents for diabetic shoe pick up. Pt states shoes are still very small and tight around the toes area. Pt was advised he would receive a call from our office to reschedule shoe fitting with Liliane Channel or his assistant. Pt stated understanding

## 2020-09-29 NOTE — Telephone Encounter (Signed)
Pt came in to fit for his shoes. Pt states the toe are was too small and didn't fit right.

## 2020-10-03 DIAGNOSIS — E118 Type 2 diabetes mellitus with unspecified complications: Secondary | ICD-10-CM | POA: Diagnosis not present

## 2020-10-04 ENCOUNTER — Other Ambulatory Visit: Payer: Self-pay

## 2020-10-04 MED ORDER — ATORVASTATIN CALCIUM 80 MG PO TABS
80.0000 mg | ORAL_TABLET | Freq: Every day | ORAL | 2 refills | Status: DC
Start: 2020-10-04 — End: 2021-07-23

## 2020-10-05 DIAGNOSIS — R928 Other abnormal and inconclusive findings on diagnostic imaging of breast: Secondary | ICD-10-CM | POA: Diagnosis not present

## 2020-10-05 DIAGNOSIS — R2689 Other abnormalities of gait and mobility: Secondary | ICD-10-CM | POA: Diagnosis not present

## 2020-10-05 DIAGNOSIS — R922 Inconclusive mammogram: Secondary | ICD-10-CM | POA: Diagnosis not present

## 2020-10-10 ENCOUNTER — Telehealth: Payer: Self-pay

## 2020-10-10 NOTE — Telephone Encounter (Signed)
I gave the results to patient about mammogram. He mentioned that he has an appointment on 10/17/2020 for biopsy.

## 2020-10-11 ENCOUNTER — Other Ambulatory Visit: Payer: Self-pay

## 2020-10-17 ENCOUNTER — Ambulatory Visit: Payer: Medicare HMO | Admitting: Neurology

## 2020-10-19 ENCOUNTER — Ambulatory Visit: Payer: Medicare HMO | Admitting: Orthotics

## 2020-10-19 ENCOUNTER — Encounter: Payer: Self-pay | Admitting: Legal Medicine

## 2020-10-19 DIAGNOSIS — N6322 Unspecified lump in the left breast, upper inner quadrant: Secondary | ICD-10-CM | POA: Diagnosis not present

## 2020-11-02 ENCOUNTER — Encounter: Payer: Self-pay | Admitting: Legal Medicine

## 2020-11-03 ENCOUNTER — Ambulatory Visit (INDEPENDENT_AMBULATORY_CARE_PROVIDER_SITE_OTHER): Payer: HMO | Admitting: Podiatry

## 2020-11-03 ENCOUNTER — Other Ambulatory Visit: Payer: Self-pay

## 2020-11-03 ENCOUNTER — Encounter: Payer: Self-pay | Admitting: Podiatry

## 2020-11-03 DIAGNOSIS — E1169 Type 2 diabetes mellitus with other specified complication: Secondary | ICD-10-CM | POA: Diagnosis not present

## 2020-11-03 DIAGNOSIS — E1142 Type 2 diabetes mellitus with diabetic polyneuropathy: Secondary | ICD-10-CM | POA: Diagnosis not present

## 2020-11-03 DIAGNOSIS — B351 Tinea unguium: Secondary | ICD-10-CM

## 2020-11-03 NOTE — Progress Notes (Signed)
  Subjective:  Patient ID: Mark Stewart, male    DOB: 1946-05-20,  MRN: [id number]  Chief Complaint  Patient presents with  . Nail Problem    Trim nails     75 y.o. male presents with the above complaint. History confirmed with patient. Denies new issues from his feet. Last A1c 9  PCP Dr. Henrene Pastor, last seen 2 months ago.  Objective:  Physical Exam: warm, good capillary refill, nail exam onychomycosis of the toenails, no trophic changes or ulcerative lesions. DP pulses palpable, PT pulses palpable, protective sensation absent and vibratory sensation diminished Left Foot: normal exam, no swelling, tenderness, instability; ligaments intact, full range of motion of all ankle/foot joints Hammertoes, pes planus Right Foot: normal exam, no swelling, tenderness, instability; ligaments intact, full range of motion of all ankle/foot joints Hammertoes, pes planus  No images are attached to the encounter.  Assessment:   1. Onychomycosis of multiple toenails with type 2 diabetes mellitus and peripheral neuropathy ([place])    Plan:  Patient was evaluated and treated and all questions answered.  Onychomycosis, Diabetes and DPN -Patient is diabetic with a qualifying condition for at risk foot care. -At risk foot care provided.  Procedure: Nail Debridement Type of Debridement: manual, sharp debridement. Instrumentation: Nail nipper, rotary burr. Number of Nails: 10  No follow-ups on file.

## 2020-11-08 DIAGNOSIS — E118 Type 2 diabetes mellitus with unspecified complications: Secondary | ICD-10-CM | POA: Diagnosis not present

## 2020-11-09 ENCOUNTER — Encounter: Payer: Self-pay | Admitting: Legal Medicine

## 2020-11-10 DIAGNOSIS — G4733 Obstructive sleep apnea (adult) (pediatric): Secondary | ICD-10-CM | POA: Diagnosis not present

## 2020-11-14 DIAGNOSIS — E113312 Type 2 diabetes mellitus with moderate nonproliferative diabetic retinopathy with macular edema, left eye: Secondary | ICD-10-CM | POA: Diagnosis not present

## 2020-11-16 ENCOUNTER — Other Ambulatory Visit: Payer: Self-pay

## 2020-11-16 ENCOUNTER — Ambulatory Visit: Payer: HMO | Admitting: Orthotics

## 2020-11-16 DIAGNOSIS — M2141 Flat foot [pes planus] (acquired), right foot: Secondary | ICD-10-CM

## 2020-11-16 DIAGNOSIS — M2142 Flat foot [pes planus] (acquired), left foot: Secondary | ICD-10-CM

## 2020-11-16 DIAGNOSIS — M2041 Other hammer toe(s) (acquired), right foot: Secondary | ICD-10-CM

## 2020-11-16 DIAGNOSIS — M2042 Other hammer toe(s) (acquired), left foot: Secondary | ICD-10-CM

## 2020-11-16 NOTE — Progress Notes (Signed)
Reordered shoes size 12 to allow for larger LEFT foot.   Dawn to verify coverage HTA

## 2020-11-17 DIAGNOSIS — E113312 Type 2 diabetes mellitus with moderate nonproliferative diabetic retinopathy with macular edema, left eye: Secondary | ICD-10-CM | POA: Diagnosis not present

## 2020-11-25 ENCOUNTER — Other Ambulatory Visit: Payer: Self-pay | Admitting: Cardiovascular Disease

## 2020-12-01 ENCOUNTER — Encounter: Payer: Self-pay | Admitting: Orthotics

## 2020-12-01 ENCOUNTER — Ambulatory Visit: Payer: HMO | Admitting: Podiatry

## 2020-12-01 ENCOUNTER — Other Ambulatory Visit: Payer: Self-pay

## 2020-12-01 DIAGNOSIS — M2042 Other hammer toe(s) (acquired), left foot: Secondary | ICD-10-CM

## 2020-12-01 DIAGNOSIS — M2041 Other hammer toe(s) (acquired), right foot: Secondary | ICD-10-CM

## 2020-12-01 NOTE — Progress Notes (Signed)
Subjective: Patient presents today for diabetic shoe pick up.  Objective:Patient tried on the diabetic shoes and on the left shoe was still too short. I stated that maybe we could try [employer] or [employer]. And that I would check with the Dr and I would call the patient back tomorrow. Patient agreed.  A:hammer toe of both feet  Plan: Patient will be getting a phone call to see what the best plan will be in order to get the corrected diabetic shoes.  Lattie Haw Bryauna Byrum-cma

## 2020-12-02 DIAGNOSIS — E7849 Other hyperlipidemia: Secondary | ICD-10-CM | POA: Diagnosis not present

## 2020-12-02 DIAGNOSIS — Z794 Long term (current) use of insulin: Secondary | ICD-10-CM | POA: Diagnosis not present

## 2020-12-02 DIAGNOSIS — E118 Type 2 diabetes mellitus with unspecified complications: Secondary | ICD-10-CM | POA: Diagnosis not present

## 2020-12-08 DIAGNOSIS — E118 Type 2 diabetes mellitus with unspecified complications: Secondary | ICD-10-CM | POA: Diagnosis not present

## 2020-12-08 DIAGNOSIS — G4733 Obstructive sleep apnea (adult) (pediatric): Secondary | ICD-10-CM | POA: Diagnosis not present

## 2020-12-11 DIAGNOSIS — M1990 Unspecified osteoarthritis, unspecified site: Secondary | ICD-10-CM | POA: Diagnosis not present

## 2020-12-11 DIAGNOSIS — Z8673 Personal history of transient ischemic attack (TIA), and cerebral infarction without residual deficits: Secondary | ICD-10-CM | POA: Diagnosis not present

## 2020-12-11 DIAGNOSIS — E11649 Type 2 diabetes mellitus with hypoglycemia without coma: Secondary | ICD-10-CM | POA: Diagnosis not present

## 2020-12-11 DIAGNOSIS — I10 Essential (primary) hypertension: Secondary | ICD-10-CM | POA: Diagnosis not present

## 2020-12-11 DIAGNOSIS — Z87891 Personal history of nicotine dependence: Secondary | ICD-10-CM | POA: Diagnosis not present

## 2020-12-12 DIAGNOSIS — Z85820 Personal history of malignant melanoma of skin: Secondary | ICD-10-CM | POA: Diagnosis not present

## 2020-12-12 DIAGNOSIS — L578 Other skin changes due to chronic exposure to nonionizing radiation: Secondary | ICD-10-CM | POA: Diagnosis not present

## 2020-12-12 DIAGNOSIS — L821 Other seborrheic keratosis: Secondary | ICD-10-CM | POA: Diagnosis not present

## 2020-12-12 DIAGNOSIS — L570 Actinic keratosis: Secondary | ICD-10-CM | POA: Diagnosis not present

## 2020-12-15 DIAGNOSIS — E113312 Type 2 diabetes mellitus with moderate nonproliferative diabetic retinopathy with macular edema, left eye: Secondary | ICD-10-CM | POA: Diagnosis not present

## 2020-12-15 DIAGNOSIS — H04123 Dry eye syndrome of bilateral lacrimal glands: Secondary | ICD-10-CM | POA: Diagnosis not present

## 2020-12-25 ENCOUNTER — Encounter: Payer: Self-pay | Admitting: Adult Health

## 2020-12-26 ENCOUNTER — Encounter: Payer: Self-pay | Admitting: Adult Health

## 2020-12-26 ENCOUNTER — Ambulatory Visit: Payer: HMO | Admitting: Adult Health

## 2020-12-26 VITALS — BP 142/80 | HR 64 | Ht 72.0 in | Wt 213.0 lb

## 2020-12-26 DIAGNOSIS — G4730 Sleep apnea, unspecified: Secondary | ICD-10-CM | POA: Diagnosis not present

## 2020-12-26 DIAGNOSIS — Z9989 Dependence on other enabling machines and devices: Secondary | ICD-10-CM

## 2020-12-26 DIAGNOSIS — G4733 Obstructive sleep apnea (adult) (pediatric): Secondary | ICD-10-CM

## 2020-12-26 NOTE — Patient Instructions (Addendum)
Recent compliance report shows adequate usage and optimal residual amount of apnea.  Highly encouraged to continue nightly usage and use the machine throughout your entire sleeping duration as well as use with any naps during the day.   Your Plan:  Continue nightly use of CPAP at current settings  Continue to follow with your DME company for any needed supplies or CPAP related concerns    Follow-up in 6 months or call earlier if needed     Thank you for coming to see Mark Stewart at [facility]. I hope we have been able to provide you high quality care today.  You may receive a patient satisfaction survey over the next few weeks. We would appreciate your feedback and comments so that we may continue to improve ourselves and the health of our patients.    Living With Sleep Apnea Sleep apnea is a condition in which breathing pauses or becomes shallow during sleep. Sleep apnea is most commonly caused by a collapsed or blocked airway. People with sleep apnea snore loudly and have times when they gasp and stop breathing for 10 seconds or more during sleep. This happens over and over during the night. This disrupts your sleep and keeps your body from getting the rest that it needs, which can cause tiredness and lack of energy (fatigue) during the day. The breaks in breathing also interrupt the deep sleep that you need to feel rested. Even if you do not completely wake up from the gaps in breathing, your sleep may not be restful. You may also have a headache in the morning and low energy during the day, and you may feel anxious or depressed. How can sleep apnea affect me? Sleep apnea increases your chances of extreme tiredness during the day (daytime fatigue). It can also increase your risk for health conditions, such as:  Heart attack.  Stroke.  Diabetes.  Heart failure.  Irregular heartbeat.  High blood pressure. If you have daytime fatigue as a result of sleep apnea, you  may be more likely to:  Perform poorly at school or work.  Fall asleep while driving.  Have difficulty with attention.  Develop depression or anxiety.  Become severely overweight (obese).  Have sexual dysfunction. What actions can I take to manage sleep apnea? Sleep apnea treatment  If you were given a device to open your airway while you sleep, use it only as told by your health care provider. You may be given: ? An oral appliance. This is a custom-made mouthpiece that shifts your lower jaw forward. ? A continuous positive airway pressure (CPAP) device. This device blows air through a mask when you breathe out (exhale). ? A nasal expiratory positive airway pressure (EPAP) device. This device has valves that you put into each nostril. ? A bi-level positive airway pressure (BPAP) device. This device blows air through a mask when you breathe in (inhale) and breathe out (exhale).  You may need surgery if other treatments do not work for you.   Sleep habits  Go to sleep and wake up at the same time every day. This helps set your internal clock (circadian rhythm) for sleeping. ? If you stay up later than usual, such as on weekends, try to get up in the morning within 2 hours of your normal wake time.  Try to get at least 7-9 hours of sleep each night.  Stop computer, tablet, and mobile phone use a few hours before bedtime.  Do not take long naps during  the day. If you nap, limit it to 30 minutes.  Have a relaxing bedtime routine. Reading or listening to music may relax you and help you sleep.  Use your bedroom only for sleep. ? Keep your television and computer out of your bedroom. ? Keep your bedroom cool, dark, and quiet. ? Use a supportive mattress and pillows.  Follow your health care provider's instructions for other changes to sleep habits. Nutrition  Do not eat heavy meals in the evening.  Do not have caffeine in the later part of the day. The effects of caffeine can  last for more than 5 hours.  Follow your health care provider's or dietitian's instructions for any diet changes. Lifestyle  Do not drink alcohol before bedtime. Alcohol can cause you to fall asleep at first, but then it can cause you to wake up in the middle of the night and have trouble getting back to sleep.  Do not use any products that contain nicotine or tobacco, such as cigarettes and e-cigarettes. If you need help quitting, ask your health care provider.      Medicines  Take over-the-counter and prescription medicines only as told by your health care provider.  Do not use over-the-counter sleep medicine. You can become dependent on this medicine, and it can make sleep apnea worse.  Do not use medicines, such as sedatives and narcotics, unless told by your health care provider. Activity  Exercise on most days, but avoid exercising in the evening. Exercising near bedtime can interfere with sleeping.  If possible, spend time outside every day. Natural light helps regulate your circadian rhythm. General information  Lose weight if you need to, and maintain a healthy weight.  Keep all follow-up visits as told by your health care provider. This is important.  If you are having surgery, make sure to tell your health care provider that you have sleep apnea. You may need to bring your device with you. Where to find more information Learn more about sleep apnea and daytime fatigue from:  American Sleep Association: sleepassociation.[place]: sleepfoundation.org  National Heart, Lung, and Blood Institute: https://www.[place]man-[place].biz/ Summary  Sleep apnea can cause daytime fatigue and other serious health conditions.  Both sleep apnea and daytime fatigue can be bad for your health and well-being.  You may need to wear a device while sleeping to help keep your airway open.  If you are having surgery, make sure to tell your health care provider that you have sleep apnea.  You may need to bring your device with you.  Making changes to sleep habits, diet, lifestyle, and activity can help you manage sleep apnea. This information is not intended to replace advice given to you by your health care provider. Make sure you discuss any questions you have with your health care provider. Document Revised: 01/16/2019 Document Reviewed: 12/19/2017 Elsevier Patient Education  [place].

## 2020-12-26 NOTE — Progress Notes (Addendum)
[facility] [address] [place] [address] [phone]       OFFICE FOLLOW UP NOTE  [name] Date of Birth:  [birth year] Medical Record Number:  [id number]   Reason for Referral:  CPAP compliance visit     SUBJECTIVE:   CHIEF COMPLAINT:  Chief Complaint  Patient presents with  . Follow-up    TR with spouse (Mark Stewart) Pt is well, autoPAP is helping him not sleep during the next day     HPI:   Mark Stewart is a 75 y.o. male with PMHx of HTN, HLD, DM, hx of stroke 2017, OSA, b/l ICA stenosis and intracranial atherosclerotic disease.  Reevaluated by Dr. Rexene Alberts on 07/25/2020 for repeat sleep study previously followed by Dr. Rexene Alberts with noted CPAP noncompliance.  Sleep study 2019 total AHI 11.1/h.  Recent HST 08/2020 showed severe OSA with total AHI of 51.7/h and recommend initiation of AutoPap titration/trial.  Review of compliance report shows satisfactory usage and optimal residual AHI at 3.7/h Tolerates CPAP without specific concerns but just "doesn't like it".  He tries to ensure he uses for greater than 4 hours per night but if he wakes up in the middle the night to go the bathroom and he has been using it for greater than 4 hours, he will not put mask back on. Per wife, when he does not use mask he is snoring and appears to have less restful sleep compared to when he is using it.  Prior concerns of falling asleep easily sitting and watching TV or going for car rides. Per wife, this has been occurring less.  He has been sleeping better at night due to less daytime napping.  Epworth Sleepiness Scale 20 Fatigue severity scale 42          ROS:   14 system review of systems performed and negative with exception of those listed in HPI  PMH:  Past Medical History:  Diagnosis Date  . Allergy   . Cancer ([place])    skin, hand  . Cataract    history of surgery bilaterally  . Hypercholesteremia   . Hypertension   . ICAO (internal  carotid artery occlusion)   . 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 Stroke [facility]) 02/2016    PSH:  Past Surgical History:  Procedure Laterality Date  . EYE SURGERY    . HERNIA REPAIR  02/28/2018  . HERNIA REPAIR Bilateral 11/2019  . PROSTATE SURGERY    . SPLENECTOMY      Social History:  Social History   Socioeconomic History  . Marital status: Married    Spouse name: Mark Stewart  . Number of children: Not on file  . Years of education: Not on file  . Highest education level: Not on file  Occupational History  . Not on file  Tobacco Use  . Smoking status: Former Smoker    Packs/day: 1.00    Years: 20.00    Pack years: 20.00  . Smokeless tobacco: Never Used  . Tobacco comment: patient states last cigarette 35 years ago  Substance and Sexual Activity  . Alcohol use: No  . Drug use: No  . Sexual activity: Not Currently  Other Topics Concern  . Not on file  Social History Narrative   Lives with wife   Right Handed   Drinks 4-5 cups caffeine daily   Social Determinants of Health   Financial Resource Strain: Low Risk   .  Difficulty of Paying Living Expenses: Not hard at all  Food Insecurity: No Food Insecurity  . Worried About Charity fundraiser in the Last Year: Never true  . Ran Out of Food in the Last Year: Never true  Transportation Needs: No Transportation Needs  . Lack of Transportation (Medical): No  . Lack of Transportation (Non-Medical): No  Physical Activity: Not on file  Stress: No Stress Concern Present  . Feeling of Stress : Not at all  Social Connections: Moderately Integrated  . Frequency of Communication with Friends and Family: Twice a week  . Frequency of Social Gatherings with Friends and Family: Twice a week  . Attends Religious Services: More than 4 times per year  . Active Member of Clubs or Organizations: No  . Attends Archivist Meetings: Never  . Marital Status: Married  Human resources officer Violence:  Not At Risk  . Fear of Current or Ex-Partner: No  . Emotionally Abused: No  . Physically Abused: No  . Sexually Abused: No    Family History:  Family History  Problem Relation Age of Onset  . Breast cancer Mother   . Diabetes Mellitus II Father   . Breast cancer Sister     Medications:   Current Outpatient Medications on File Prior to Visit  Medication Sig Dispense Refill  . albuterol (VENTOLIN HFA) 108 (90 Base) MCG/ACT inhaler Inhale 2 puffs into the lungs every 6 (six) hours as needed. 18 g 5  . amLODipine (NORVASC) 10 MG tablet Take 1 tablet (10 mg total) by mouth daily. 90 tablet 2  . aspirin EC 81 MG tablet Take 81 mg by mouth daily.    [place] Kitchen atorvastatin (LIPITOR) 80 MG tablet Take 1 tablet (80 mg total) by mouth daily. 90 tablet 2  . BAQSIMI TWO PACK 3 MG/DOSE POWD     . buPROPion (WELLBUTRIN XL) 150 MG 24 hr tablet Take 150 mg by mouth daily.    . carvedilol (COREG) 25 MG tablet TAKE 2 TABLETS (50 MG TOTAL) BY MOUTH 2 (TWO) TIMES DAILY. CALL FOR APPOINTMENT 360 tablet 1  . cloNIDine (CATAPRES - DOSED IN MG/24 HR) 0.2 mg/24hr patch PLACE 1 PATCH (0.2 MG TOTAL) ONTO THE SKIN ONCE A WEEK. 12 patch 2  . cyclobenzaprine (FLEXERIL) 10 MG tablet Take 1 tablet (10 mg total) by mouth 3 (three) times daily as needed for muscle spasms. 30 tablet 2  . empagliflozin (JARDIANCE) 25 MG TABS tablet Take 25 mg by mouth daily.     . fluticasone (FLONASE) 50 MCG/ACT nasal spray Place 2 sprays into both nostrils daily as needed for allergies or rhinitis. Reported on 04/26/2016 18.2 mL 6  . hydrochlorothiazide (HYDRODIURIL) 25 MG tablet Take 1 tablet (25 mg total) by mouth daily. 90 tablet 2  . Insulin Disposable Pump (OMNIPOD DASH 5 PACK PODS) MISC     . minoxidil (LONITEN) 10 MG tablet TAKE 1/2 TABLET BY MOUTH EVERY DAY 45 tablet 2  . NOVOLOG 100 UNIT/ML injection     . omeprazole (PRILOSEC) 40 MG capsule TAKE 1 CAPSULE BY MOUTH EVERY DAY 90 capsule 2  . spironolactone (ALDACTONE) 50 MG tablet      . triamcinolone cream (KENALOG) 0.5 % Apply topically.    . valsartan (DIOVAN) 320 MG tablet Take 1 tablet (320 mg total) by mouth daily. 90 tablet 2  . Vitamin D, Ergocalciferol, (DRISDOL) 1.25 MG (50000 UNIT) CAPS capsule Take 1 capsule (50,000 Units total) by mouth every 7 (seven) days. 12  capsule 3   No current facility-administered medications on file prior to visit.    Allergies:   Allergies  Allergen Reactions  . Benazepril Cough  . Metformin And Related Rash  . Penicillins Rash    Has patient had a PCN reaction causing immediate rash, facial/tongue/throat swelling, SOB or lightheadedness with hypotension: yes Has patient had a PCN reaction causing severe rash involving mucus membranes or skin necrosis: no Has patient had a PCN reaction that required hospitalization: no Has patient had a PCN reaction occurring within the last 10 years: no If all of the above answers are "NO", then may proceed with Cephalosporin use.       OBJECTIVE:  Physical Exam  Vitals:   12/26/20 1004  BP: (!) 142/80  Pulse: 64  Weight: 213 lb (96.6 kg)  Height: 6' (1.829 m)   Body mass index is 28.89 kg/m. No exam data present  General: well developed, well nourished,  pleasant elderly Caucasian male, seated, in no evident distress Head: head normocephalic and atraumatic.   Neck: supple with no carotid or supraclavicular bruits Cardiovascular: regular rate and rhythm, no murmurs Musculoskeletal: no deformity Skin:  no rash/petichiae Vascular:  Normal pulses all extremities   Neurologic Exam Mental Status: Awake and fully alert.   Fluent speech and language.  Oriented to place and time. Recent and remote memory intact. Attention span, concentration and fund of knowledge appropriate. Mood and affect appropriate.  Cranial Nerves: Fundoscopic exam reveals sharp disc margins. Pupils equal, briskly reactive to light. Extraocular movements full without nystagmus. Visual fields full to  confrontation. Hearing intact. Facial sensation intact. Face, tongue, palate moves normally and symmetrically.  Motor: Normal bulk and tone. Normal strength in all tested extremity muscles except slightly decreased left hand grip strength (chronic) Sensory.: intact to touch , pinprick , position and vibratory sensation.  Coordination: Rapid alternating movements normal in all extremities. Finger-to-nose and heel-to-shin performed accurately bilaterally. Gait and Station: Arises from chair without difficulty. Stance is normal. Gait demonstrates normal stride length with mild unsteadiness initially without use of assistive device.  Tandem walk and heel toe mild difficulty.  Reflexes: 1+ and symmetric. Toes downgoing.        ASSESSMENT/PLAN: Mark Stewart is a 75 y.o. year old male PHMx of prior stroke, HTN, HLD, DM, carotid stenosis and severe sleep apnea.  Prior sleep study 01/2018 showed mild sleep apnea with total AHI 11.1/h but noncompliant with CPAP with difficulty tolerating.  Reestablish care with Dr. Rexene Alberts with HST 08/2020 showing severe obstructive sleep apnea with total AHI 51.7/h.  Being seen today, 12/26/2020, for initial CPAP compliance visit        1. Severe OSA:  a. Compliance report: 87% compliance with residual AHI 3.7.  Discussed importance of nightly compliance and to use throughout the entire duration while sleeping and with daytime napping b. History of CPAP noncompliance -discussed other treatment options such as inspire device but per patient, he was told by Dr. Rexene Alberts that he would not be a good candidate -unable to verify this through notes -this can be further discussed in the future if indicated.  Discussed ways to help improve tolerance c. Continue current settings: min pressure 6, max pressure 12 and EPR level 2 d. Continue to follow with DME company for any needed supplies or CPAP related concerns    Follow up in 6 months or call earlier if needed   CC:  [place]  provider: Dr. Rexene Alberts PCP: Lillard Anes, MD  I spent 25 minutes of face-to-face and non-face-to-face time with patient and wife.  This included previsit chart review, lab review, study review, order entry, electronic health record documentation, patient education regarding diagnosis of severe sleep apnea and importance of CPAP machine, reviewing discussion of compliance report and answered all other questions to patient and wife's satisfaction   [name], AGNP-BC  [facility] [address] [place], [place] [address]  Phone [phone] Fax [phone] Note: This document was prepared with digital dictation and possible smart phrase technology. Any transcriptional errors that result from this process are unintentional.  I reviewed the above note and documentation by the Nurse Practitioner and agree with the history, exam, assessment and plan as outlined above. I was available for consultation. Star Age, MD, PhD [facility])

## 2020-12-28 NOTE — Progress Notes (Signed)
Cm sent to [facility]

## 2021-01-08 DIAGNOSIS — G4733 Obstructive sleep apnea (adult) (pediatric): Secondary | ICD-10-CM | POA: Diagnosis not present

## 2021-01-11 DIAGNOSIS — E118 Type 2 diabetes mellitus with unspecified complications: Secondary | ICD-10-CM | POA: Diagnosis not present

## 2021-01-12 ENCOUNTER — Telehealth: Payer: Self-pay | Admitting: Podiatry

## 2021-01-12 NOTE — Telephone Encounter (Signed)
Patient called inquiring about diabetic shoes, was told someone would contact the following day with solution regarding diabetic shoes. Patients information was passed to [place] office for further review and Caryl Pina stated she would follow up with Lattie Haw regarding matter

## 2021-01-16 ENCOUNTER — Telehealth: Payer: Self-pay | Admitting: Podiatry

## 2021-01-16 NOTE — Telephone Encounter (Signed)
Pt called checking status of diabetic shoes. He stated he called last week and someone was to look into it and call him back.   I apologized and told pt I was checking and would call pt back asap. Lisa's last note stated the left shoe was too short and she was going to see what Dr March Rummage recommended.

## 2021-01-16 NOTE — Telephone Encounter (Signed)
Will get patient scheduled with orthotist to adjust size. I think this is preferable to starting the process over by referring him to [facility]

## 2021-01-19 ENCOUNTER — Other Ambulatory Visit: Payer: Self-pay

## 2021-01-19 DIAGNOSIS — J449 Chronic obstructive pulmonary disease, unspecified: Secondary | ICD-10-CM

## 2021-01-19 MED ORDER — AMLODIPINE BESYLATE 10 MG PO TABS: 10.0000 mg | ORAL_TABLET | Freq: Every day | ORAL | 2 refills | Status: DC

## 2021-02-03 ENCOUNTER — Ambulatory Visit (INDEPENDENT_AMBULATORY_CARE_PROVIDER_SITE_OTHER): Payer: HMO

## 2021-02-03 ENCOUNTER — Other Ambulatory Visit: Payer: Self-pay

## 2021-02-03 DIAGNOSIS — E1142 Type 2 diabetes mellitus with diabetic polyneuropathy: Secondary | ICD-10-CM

## 2021-02-03 DIAGNOSIS — E1169 Type 2 diabetes mellitus with other specified complication: Secondary | ICD-10-CM

## 2021-02-03 DIAGNOSIS — B351 Tinea unguium: Secondary | ICD-10-CM

## 2021-02-07 DIAGNOSIS — G4733 Obstructive sleep apnea (adult) (pediatric): Secondary | ICD-10-CM | POA: Diagnosis not present

## 2021-02-10 DIAGNOSIS — E118 Type 2 diabetes mellitus with unspecified complications: Secondary | ICD-10-CM | POA: Diagnosis not present

## 2021-02-16 ENCOUNTER — Ambulatory Visit: Payer: HMO | Admitting: Podiatry

## 2021-02-16 DIAGNOSIS — I672 Cerebral atherosclerosis: Secondary | ICD-10-CM | POA: Insufficient documentation

## 2021-02-16 DIAGNOSIS — H04123 Dry eye syndrome of bilateral lacrimal glands: Secondary | ICD-10-CM | POA: Diagnosis not present

## 2021-02-16 DIAGNOSIS — E113312 Type 2 diabetes mellitus with moderate nonproliferative diabetic retinopathy with macular edema, left eye: Secondary | ICD-10-CM | POA: Diagnosis not present

## 2021-02-17 ENCOUNTER — Other Ambulatory Visit: Payer: HMO

## 2021-02-21 ENCOUNTER — Other Ambulatory Visit: Payer: Self-pay

## 2021-02-21 ENCOUNTER — Ambulatory Visit (INDEPENDENT_AMBULATORY_CARE_PROVIDER_SITE_OTHER): Payer: HMO | Admitting: *Deleted

## 2021-02-21 DIAGNOSIS — M2141 Flat foot [pes planus] (acquired), right foot: Secondary | ICD-10-CM | POA: Diagnosis not present

## 2021-02-21 DIAGNOSIS — E1142 Type 2 diabetes mellitus with diabetic polyneuropathy: Secondary | ICD-10-CM

## 2021-02-21 DIAGNOSIS — M2042 Other hammer toe(s) (acquired), left foot: Secondary | ICD-10-CM | POA: Diagnosis not present

## 2021-02-21 DIAGNOSIS — M2041 Other hammer toe(s) (acquired), right foot: Secondary | ICD-10-CM

## 2021-02-21 DIAGNOSIS — M2142 Flat foot [pes planus] (acquired), left foot: Secondary | ICD-10-CM | POA: Diagnosis not present

## 2021-02-21 NOTE — Progress Notes (Signed)
Patient presents today to pick up diabetic shoes and insoles.  Patient was dispensed 1 pair of diabetic shoes and 3 pairs of foam casted diabetic insoles. Fit was satisfactory. Instructions for break-in and wear was reviewed and a copy was given to the patient.   Re-appointment for regularly scheduled diabetic foot care visits or if they should experience any trouble with the shoes or insoles.  

## 2021-03-02 DIAGNOSIS — E1142 Type 2 diabetes mellitus with diabetic polyneuropathy: Secondary | ICD-10-CM | POA: Diagnosis not present

## 2021-03-02 DIAGNOSIS — Z794 Long term (current) use of insulin: Secondary | ICD-10-CM | POA: Diagnosis not present

## 2021-03-02 DIAGNOSIS — E7849 Other hyperlipidemia: Secondary | ICD-10-CM | POA: Diagnosis not present

## 2021-03-02 DIAGNOSIS — E118 Type 2 diabetes mellitus with unspecified complications: Secondary | ICD-10-CM | POA: Diagnosis not present

## 2021-03-10 ENCOUNTER — Other Ambulatory Visit: Payer: Self-pay | Admitting: Cardiovascular Disease

## 2021-03-10 DIAGNOSIS — G4733 Obstructive sleep apnea (adult) (pediatric): Secondary | ICD-10-CM | POA: Diagnosis not present

## 2021-03-17 ENCOUNTER — Encounter: Payer: Self-pay | Admitting: Cardiovascular Disease

## 2021-03-17 ENCOUNTER — Other Ambulatory Visit: Payer: Self-pay

## 2021-03-17 ENCOUNTER — Ambulatory Visit: Payer: HMO | Admitting: Cardiovascular Disease

## 2021-03-17 VITALS — BP 134/66 | HR 67 | Ht 72.0 in | Wt 211.8 lb

## 2021-03-17 DIAGNOSIS — I10 Essential (primary) hypertension: Secondary | ICD-10-CM

## 2021-03-17 DIAGNOSIS — E7800 Pure hypercholesterolemia, unspecified: Secondary | ICD-10-CM | POA: Diagnosis not present

## 2021-03-17 DIAGNOSIS — I69354 Hemiplegia and hemiparesis following cerebral infarction affecting left non-dominant side: Secondary | ICD-10-CM

## 2021-03-17 DIAGNOSIS — E1165 Type 2 diabetes mellitus with hyperglycemia: Secondary | ICD-10-CM

## 2021-03-17 NOTE — Patient Instructions (Signed)

## 2021-03-17 NOTE — Progress Notes (Signed)
Cardiology Office Note:    Date:  03/17/2021   ID:  Mark Stewart, DOB [birth year], MRN [id number]  PCP:  Lillard Anes, MD  Cardiologist:  Sanda Klein, MD    Referring MD: Lillard Anes,*   Chief Complaint  Patient presents with   Follow-up    6 months.  Severe hypertension  History of Present Illness:    Mark Stewart is a 75 y.o. male with a hx of Long-standing hypertension (over 20 years), insulin requiring type 2 diabetes mellitus, hyperlipidemia, right thalamic ischemic stroke in May 2017.  He feels well and has no specific cardiovascular complaints.  As always he feels "lazy" which has been an issue ever since he had a stroke.  He avoids bending over or jumping up quickly since he feels dizzy.  The patient specifically denies any chest pain at rest exertion, dyspnea at rest or with exertion, orthopnea, paroxysmal nocturnal dyspnea, syncope, palpitations, focal neurological deficits, intermittent claudication, lower extremity edema, unexplained weight gain, cough, hemoptysis or wheezing.  Other than orthostatic dizziness he has tolerated treatment with minoxidil very well, without complaints of edema, excessive hair growth, pericardial effusion, racing heart rate, etc.  Glycemic control is imperfect with a hemoglobin A1c of 9%.  His most recent LDL cholesterol was 79.  Duplex US did not show evidence of renal artery stenosis. 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  Past Medical History:  Diagnosis Date   Allergy    Cancer ([place])    skin, hand   Cataract    history of surgery bilaterally   Hypercholesteremia    Hypertension    ICAO (internal carotid artery occlusion)    Spleen absent    patient states had spleen removed in high school   Stroke [facility]) 02/2016    Past Surgical History:  Procedure Laterality Date   EYE SURGERY     HERNIA REPAIR  02/28/2018   HERNIA REPAIR Bilateral 11/2019   PROSTATE  SURGERY     SPLENECTOMY      Current Medications: Current Meds  Medication Sig   amLODipine (NORVASC) 10 MG tablet Take 1 tablet (10 mg total) by mouth daily.   aspirin EC 81 MG tablet Take 81 mg by mouth daily.   atorvastatin (LIPITOR) 80 MG tablet Take 1 tablet (80 mg total) by mouth daily.   buPROPion (WELLBUTRIN XL) 150 MG 24 hr tablet Take 150 mg by mouth daily.   carvedilol (COREG) 25 MG tablet TAKE 2 TABLETS (50 MG TOTAL) BY MOUTH 2 (TWO) TIMES DAILY. CALL FOR APPOINTMENT   cloNIDine (CATAPRES - DOSED IN MG/24 HR) 0.2 mg/24hr patch PLACE 1 PATCH (0.2 MG TOTAL) ONTO THE SKIN ONCE A WEEK.   cyclobenzaprine (FLEXERIL) 10 MG tablet Take 1 tablet (10 mg total) by mouth 3 (three) times daily as needed for muscle spasms.   empagliflozin (JARDIANCE) 25 MG TABS tablet Take 25 mg by mouth daily.    fluticasone (FLONASE) 50 MCG/ACT nasal spray Place 2 sprays into both nostrils daily as needed for allergies or rhinitis. Reported on 04/26/2016   hydrochlorothiazide (HYDRODIURIL) 25 MG tablet Take 1 tablet (25 mg total) by mouth daily.   Insulin Disposable Pump (OMNIPOD DASH 5 PACK PODS) MISC    minoxidil (LONITEN) 10 MG tablet TAKE 1/2 TABLET BY MOUTH EVERY Stewart   NOVOLOG 100 UNIT/ML injection    omeprazole (PRILOSEC) 40 MG capsule TAKE 1 CAPSULE BY MOUTH EVERY Stewart   spironolactone (ALDACTONE) 50 MG  tablet TAKE 1 TABLET BY MOUTH EVERY Stewart   valsartan (DIOVAN) 320 MG tablet Take 1 tablet (320 mg total) by mouth daily.     Allergies:   Benazepril, Metformin and related, and Penicillins   Social History   Socioeconomic History   Marital status: Married    Spouse name: graye   Number of children: Not on file   Years of education: Not on file   Highest education level: Not on file  Occupational History   Not on file  Tobacco Use   Smoking status: Former    Packs/Stewart: 1.00    Years: 20.00    Pack years: 20.00    Types: Cigarettes   Smokeless tobacco: Never   Tobacco comments:     patient states last cigarette 35 years ago  Substance and Sexual Activity   Alcohol use: No   Drug use: No   Sexual activity: Not Currently  Other Topics Concern   Not on file  Social History Narrative   Lives with wife   Right Handed   Drinks 4-5 cups caffeine daily   Social Determinants of Health   Financial Resource Strain: Low Risk    Difficulty of Paying Living Expenses: Not hard at all  Food Insecurity: No Food Insecurity   Worried About Charity fundraiser in the Last Year: Never true   [place] in the Last Year: Never true  Transportation Needs: No Transportation Needs   Lack of Transportation (Medical): No   Lack of Transportation (Non-Medical): No  Physical Activity: Not on file  Stress: No Stress Concern Present   Feeling of Stress : Not at all  Social Connections: Moderately Integrated   Frequency of Communication with Friends and Family: Twice a week   Frequency of Social Gatherings with Friends and Family: Twice a week   Attends Religious Services: More than 4 times per year   Active Member of [employer] or Organizations: No   Attends Music therapist: Never   Marital Status: Married     Family History: The patient's family history includes Breast cancer in his mother and sister; Diabetes Mellitus II in his father. ROS:   Please see the history of present illness.    All other systems are reviewed and are negative.  EKGs/Labs/Other Studies Reviewed:    MRI from 02/26/2016 shows an acute/subacute ischemic infarct involving the right thalamus, also mild chronic small vessel ischemic disease Echo from 02/27/2016 shows a mildly dilated left ventricle (LVEDD 55 mm may actually be normal), normal EF 55-60 percent, abnormal relaxation, left atrium 39 mm  EKG:  EKG is ordered today, personally reviewed and shows sinus rhythm with first-degree AV block, QS pattern in leads V1-V2 and incomplete right bundle branch block.  No acute ischemic changes are  seen.  Recent Labs: 05/06/2020: Magnesium 2.1 09/16/2020: ALT 24; BUN 15; Creatinine, Ser 1.01; Hemoglobin 14.7; Platelets 291; Potassium 4.3; Sodium 136  06/02/2019 hemoglobin 15.6, creatinine 1.22, potassium 4.6, normal liver function test, TSH 0.832 Recent Lipid Panel    Component Value Date/Time   CHOL 140 09/16/2020 0957   TRIG 133 09/16/2020 0957   HDL 37 (L) 09/16/2020 0957   CHOLHDL 3.8 09/16/2020 0957   CHOLHDL 3.6 02/27/2016 0536   VLDL 16 02/27/2016 0536   LDLCALC 79 09/16/2020 0957   August 20 2018 lipid profile Total cholesterol 158, HDL 51, LDL 111, triglycerides 106 Hemoglobin A1c 12%  06/02/2019 total cholesterol 134, HDL 41, LDL 75, triglycerides 88 Hemoglobin  A1c 8.1%  Physical Exam:    VS:  BP 134/66 (BP Location: Left Arm, Patient Position: Sitting, Cuff Size: Normal)   Pulse 67   Ht 6' (1.829 m)   Wt 211 lb 12.8 oz (96.1 kg)   SpO2 95%   BMI 28.73 kg/m     Wt Readings from Last 3 Encounters:  03/17/21 211 lb 12.8 oz (96.1 kg)  12/26/20 213 lb (96.6 kg)  09/19/20 217 lb 9.6 oz (98.7 kg)     General: Alert, oriented x3, no distress Head: no evidence of trauma, PERRL, EOMI, no exophtalmos or lid lag, no myxedema, no xanthelasma; normal ears, nose and oropharynx Neck: normal jugular venous pulsations and no hepatojugular reflux; brisk carotid pulses without delay and no carotid bruits Chest: clear to auscultation, no signs of consolidation by percussion or palpation, normal fremitus, symmetrical and full respiratory excursions Cardiovascular: normal position and quality of the apical impulse, regular rhythm, normal first and second heart sounds, no murmurs, rubs or gallops Abdomen: no tenderness or distention, no masses by palpation, no abnormal pulsatility or arterial bruits, normal bowel sounds, no hepatosplenomegaly Extremities: no clubbing, cyanosis or edema; 2+ radial, ulnar and brachial pulses bilaterally; 2+ right femoral, posterior tibial and  dorsalis pedis pulses; 2+ left femoral, posterior tibial and dorsalis pedis pulses; no subclavian or femoral bruits Neurological: grossly nonfocal other than barely noticeable left hemiparesis in his upper extremity Psych: Normal mood and affect    ASSESSMENT:    1. Essential hypertension   2. Hemiparesis affecting left side as late effect of stroke ([place])   3. Hypercholesterolemia   4. Poorly controlled diabetes mellitus ([place])     PLAN:    In order of problems listed above:  HTN: Requires multiple medications for control including minoxidil, but I think we finally achieved a reasonable management of his blood pressure.  Discussed the risk of rebound hypertension with abrupt interruption of carvedilol.  He has a clonidine patch and that should be less of an issue with that medication. L hemiparesis: No new neurological events.  Minimal residual deficits.  What has persisted after his stroke he has apathy and significant deterioration in executive skills.  On aspirin 81 mg daily.  Atrial fibrillation has never been detected. HLP: LDL cholesterol is not far from goal, chronically low HDL would not improve without additional physical activity. DM: Control remains suboptimal.  I am pleased that he is on an SGLT2 inhibitor.  Pointed out that he should remain very well-hydrated while he takes that medication, especially since he is also taking diuretics.  Medication Adjustments/Labs and Tests Ordered: Current medicines are reviewed at length with the patient today.  Concerns regarding medicines are outlined above.  Orders Placed This Encounter  Procedures   EKG 12-Lead    No orders of the defined types were placed in this encounter.  Patient Instructions  Medication Instructions:  No changes *If you need a refill on your cardiac medications before your next appointment, please call your pharmacy*   Lab Work: None ordered If you have labs (blood work) drawn today and your tests are  completely normal, you will receive your results only by: [place] (if you have MyChart) OR A paper copy in the mail If you have any lab test that is abnormal or we need to change your treatment, we will call you to review the results.   Testing/Procedures: None ordered   Follow-Up: At [facility], you and your health needs are our priority.  As  part of our continuing mission to provide you with exceptional heart care, we have created designated Provider Care Teams.  These Care Teams include your primary Cardiologist (physician) and Advanced Practice Providers (APPs -  Physician Assistants and Nurse Practitioners) who all work together to provide you with the care you need, when you need it.  We recommend signing up for the patient portal called "MyChart".  Sign up information is provided on this After Visit Summary.  MyChart is used to connect with patients for Virtual Visits (Telemedicine).  Patients are able to view lab/test results, encounter notes, upcoming appointments, etc.  Non-urgent messages can be sent to your provider as well.   To learn more about what you can do with MyChart, go to [email or web address].    Your next appointment:   12 month(s)  The format for your next appointment:   In Person  Provider:   You may see Sanda Klein, MD or one of the following Advanced Practice Providers on your designated Care Team:   Almyra Deforest, PA-C Fabian Sharp, [place] or  Roby Lofts, PA-C    Signed, Sanda Klein, MD  03/17/2021 12:46 PM    [place]

## 2021-03-20 DIAGNOSIS — L821 Other seborrheic keratosis: Secondary | ICD-10-CM | POA: Diagnosis not present

## 2021-03-20 DIAGNOSIS — Z85820 Personal history of malignant melanoma of skin: Secondary | ICD-10-CM | POA: Diagnosis not present

## 2021-03-20 DIAGNOSIS — L570 Actinic keratosis: Secondary | ICD-10-CM | POA: Diagnosis not present

## 2021-03-20 DIAGNOSIS — L578 Other skin changes due to chronic exposure to nonionizing radiation: Secondary | ICD-10-CM | POA: Diagnosis not present

## 2021-03-20 NOTE — Progress Notes (Signed)
Patient seen today in office by EJ with ohi. EJ reports shoes fit fine but CDIs too short. EJ will have CDIs re-made at this time for the patient.

## 2021-03-21 ENCOUNTER — Encounter: Payer: Self-pay | Admitting: Legal Medicine

## 2021-03-21 ENCOUNTER — Other Ambulatory Visit: Payer: Self-pay

## 2021-03-21 ENCOUNTER — Ambulatory Visit (INDEPENDENT_AMBULATORY_CARE_PROVIDER_SITE_OTHER): Payer: HMO | Admitting: Legal Medicine

## 2021-03-21 VITALS — BP 110/60 | HR 71 | Temp 98.1°F | Resp 16 | Ht 72.0 in | Wt 208.0 lb

## 2021-03-21 DIAGNOSIS — E1142 Type 2 diabetes mellitus with diabetic polyneuropathy: Secondary | ICD-10-CM

## 2021-03-21 DIAGNOSIS — I701 Atherosclerosis of renal artery: Secondary | ICD-10-CM | POA: Diagnosis not present

## 2021-03-21 DIAGNOSIS — Z794 Long term (current) use of insulin: Secondary | ICD-10-CM

## 2021-03-21 DIAGNOSIS — E113299 Type 2 diabetes mellitus with mild nonproliferative diabetic retinopathy without macular edema, unspecified eye: Secondary | ICD-10-CM

## 2021-03-21 DIAGNOSIS — J449 Chronic obstructive pulmonary disease, unspecified: Secondary | ICD-10-CM

## 2021-03-21 DIAGNOSIS — E782 Mixed hyperlipidemia: Secondary | ICD-10-CM

## 2021-03-21 DIAGNOSIS — D692 Other nonthrombocytopenic purpura: Secondary | ICD-10-CM | POA: Insufficient documentation

## 2021-03-21 DIAGNOSIS — I10 Essential (primary) hypertension: Secondary | ICD-10-CM

## 2021-03-21 DIAGNOSIS — Z6828 Body mass index (BMI) 28.0-28.9, adult: Secondary | ICD-10-CM

## 2021-03-21 DIAGNOSIS — E11319 Type 2 diabetes mellitus with unspecified diabetic retinopathy without macular edema: Secondary | ICD-10-CM | POA: Insufficient documentation

## 2021-03-21 DIAGNOSIS — I69354 Hemiplegia and hemiparesis following cerebral infarction affecting left non-dominant side: Secondary | ICD-10-CM | POA: Diagnosis not present

## 2021-03-21 DIAGNOSIS — F331 Major depressive disorder, recurrent, moderate: Secondary | ICD-10-CM

## 2021-03-21 DIAGNOSIS — E559 Vitamin D deficiency, unspecified: Secondary | ICD-10-CM | POA: Diagnosis not present

## 2021-03-21 DIAGNOSIS — H906 Mixed conductive and sensorineural hearing loss, bilateral: Secondary | ICD-10-CM | POA: Insufficient documentation

## 2021-03-21 LAB — POCT UA - MICROALBUMIN: Microalbumin Ur, POC: 150 mg/L

## 2021-03-21 NOTE — Progress Notes (Signed)
Established Patient Office Visit  Subjective:  Patient ID: Mark Stewart, male    DOB: August 08, 1946  Age: 75 y.o. MRN: [id number]  CC:  Chief Complaint  Patient presents with   Diabetes   Hypertension   Hyperlipidemia    HPI Mark Stewart presents for chronic visit  Patient present with type 2 diabetes.  Specifically, this is type 2, insulin requiring diabetes, complicated by polyneuropathy.  Compliance with treatment has been good; patient take medicines as directed, maintains diet and exercise regimen, follows up as directed, and is keeping glucose diary.  Date of  diagnosis 2010.  Depression screen has been performed.Tobacco screen nonsmoker. Current medicines for diabetes jardiance., omnipod  Patient is on none for renal protection and aorvastatin for cholesterol control.  Patient performs foot exams daily and last ophthalmologic exam was yes.   Patient presents with hyperlipidemia.  Compliance with treatment has been good; patient takes medicines as directed, maintains low cholesterol diet, follows up as directed, and maintains exercise regimen.  Patient is using atorvastatin without problems.   Patient presents for follow up of hypertension.  Patient tolerating minoxidil and valsartan well with side effects.  Patient was diagnosed with hypertension 2010 so has been treated for hypertension for 10 years.Patient is working on maintaining diet and exercise regimen and follows up as directed. Complication include stroke.   Past Medical History:  Diagnosis Date   Allergy    Cancer ([place])    skin, hand   Cataract    history of surgery bilaterally   Hypercholesteremia    Hypertension    ICAO (internal carotid artery occlusion)    Spleen absent    patient states had spleen removed in high school   Stroke [facility]) 02/2016    Past Surgical History:  Procedure Laterality Date   EYE SURGERY     HERNIA REPAIR  02/28/2018   HERNIA REPAIR Bilateral 11/2019   PROSTATE SURGERY      SPLENECTOMY      Family History  Problem Relation Age of Onset   Breast cancer Mother    Diabetes Mellitus II Father    Breast cancer Sister     Social History   Socioeconomic History   Marital status: Married    Spouse name: graye   Number of children: Not on file   Years of education: Not on file   Highest education level: Not on file  Occupational History   Not on file  Tobacco Use   Smoking status: Former    Packs/day: 1.00    Years: 20.00    Pack years: 20.00    Types: Cigarettes   Smokeless tobacco: Never   Tobacco comments:    patient states last cigarette 35 years ago  Substance and Sexual Activity   Alcohol use: No   Drug use: No   Sexual activity: Not Currently  Other Topics Concern   Not on file  Social History Narrative   Lives with wife   Right Handed   Drinks 4-5 cups caffeine daily   Social Determinants of Health   Financial Resource Strain: Low Risk    Difficulty of Paying Living Expenses: Not hard at all  Food Insecurity: No Food Insecurity   Worried About Charity fundraiser in the Last Year: Never true   Ran Out of Food in the Last Year: Never true  Transportation Needs: No Transportation Needs   Lack of Transportation (Medical): No   Lack of Transportation (Non-Medical): No  Physical Activity: Not  on file  Stress: No Stress Concern Present   Feeling of Stress : Not at all  Social Connections: Moderately Integrated   Frequency of Communication with Friends and Family: Twice a week   Frequency of Social Gatherings with Friends and Family: Twice a week   Attends Religious Services: More than 4 times per year   Active Member of [employer] or Organizations: No   Attends Music therapist: Never   Marital Status: Married  Human resources officer Violence: Not At Risk   Fear of Current or Ex-Partner: No   Emotionally Abused: No   Physically Abused: No   Sexually Abused: No    Outpatient Medications Prior to Visit  Medication Sig  Dispense Refill   amLODipine (NORVASC) 10 MG tablet Take 1 tablet (10 mg total) by mouth daily. 90 tablet 2   aspirin EC 81 MG tablet Take 81 mg by mouth daily.     atorvastatin (LIPITOR) 80 MG tablet Take 1 tablet (80 mg total) by mouth daily. 90 tablet 2   buPROPion (WELLBUTRIN XL) 150 MG 24 hr tablet Take 150 mg by mouth daily.     carvedilol (COREG) 25 MG tablet TAKE 2 TABLETS (50 MG TOTAL) BY MOUTH 2 (TWO) TIMES DAILY. CALL FOR APPOINTMENT 360 tablet 1   cloNIDine (CATAPRES - DOSED IN MG/24 HR) 0.2 mg/24hr patch PLACE 1 PATCH (0.2 MG TOTAL) ONTO THE SKIN ONCE A WEEK. 12 patch 2   cyclobenzaprine (FLEXERIL) 10 MG tablet Take 1 tablet (10 mg total) by mouth 3 (three) times daily as needed for muscle spasms. 30 tablet 2   empagliflozin (JARDIANCE) 25 MG TABS tablet Take 25 mg by mouth daily.      fluticasone (FLONASE) 50 MCG/ACT nasal spray Place 2 sprays into both nostrils daily as needed for allergies or rhinitis. Reported on 04/26/2016 18.2 mL 6   hydrochlorothiazide (HYDRODIURIL) 25 MG tablet Take 1 tablet (25 mg total) by mouth daily. 90 tablet 2   Insulin Disposable Pump (OMNIPOD DASH 5 PACK PODS) MISC      minoxidil (LONITEN) 10 MG tablet TAKE 1/2 TABLET BY MOUTH EVERY DAY 45 tablet 2   NOVOLOG 100 UNIT/ML injection      omeprazole (PRILOSEC) 40 MG capsule TAKE 1 CAPSULE BY MOUTH EVERY DAY 90 capsule 2   spironolactone (ALDACTONE) 50 MG tablet TAKE 1 TABLET BY MOUTH EVERY DAY 90 tablet 3   valsartan (DIOVAN) 320 MG tablet Take 1 tablet (320 mg total) by mouth daily. 90 tablet 2   No facility-administered medications prior to visit.    Allergies  Allergen Reactions   Benazepril Cough   Metformin And Related Rash   Penicillins Rash    Has patient had a PCN reaction causing immediate rash, facial/tongue/throat swelling, SOB or lightheadedness with hypotension: yes Has patient had a PCN reaction causing severe rash involving mucus membranes or skin necrosis: no Has patient had a PCN  reaction that required hospitalization: no Has patient had a PCN reaction occurring within the last 10 years: no If all of the above answers are "NO", then may proceed with Cephalosporin use.     ROS Review of Systems  Constitutional:  Negative for chills, fatigue and fever.  HENT:  Negative for congestion, ear pain and sore throat.   Respiratory:  Negative for cough and shortness of breath.   Cardiovascular:  Negative for chest pain.  Gastrointestinal:  Negative for abdominal pain, constipation, diarrhea, nausea and vomiting.  Endocrine: Negative for polydipsia, polyphagia and polyuria.  Genitourinary:  Negative for dysuria and frequency.  Musculoskeletal:  Negative for arthralgias and myalgias.  Neurological:  Positive for weakness (left). Negative for dizziness and headaches.  Psychiatric/Behavioral:  Negative for dysphoric mood.        No dysphoria     Objective:    Physical Exam Vitals reviewed.  Constitutional:      Appearance: Normal appearance.  HENT:     Head: Normocephalic.     Right Ear: Tympanic membrane, ear canal and external ear normal.     Left Ear: Tympanic membrane, ear canal and external ear normal.     Nose: Nose normal.     Mouth/Throat:     Mouth: Mucous membranes are moist.     Pharynx: Oropharynx is clear.  Eyes:     Extraocular Movements: Extraocular movements intact.     Conjunctiva/sclera: Conjunctivae normal.     Pupils: Pupils are equal, round, and reactive to light.  Cardiovascular:     Rate and Rhythm: Normal rate and regular rhythm.     Pulses: Normal pulses.     Heart sounds: Normal heart sounds. No murmur heard.   No gallop.  Pulmonary:     Effort: Pulmonary effort is normal. No respiratory distress.     Breath sounds: Normal breath sounds. No wheezing.  Abdominal:     General: Abdomen is flat. Bowel sounds are normal. There is no distension.     Palpations: Abdomen is soft.     Tenderness: There is no abdominal tenderness.   Musculoskeletal:        General: Normal range of motion.     Cervical back: Normal range of motion and neck supple.  Skin:    General: Skin is warm.     Capillary Refill: Capillary refill takes less than 2 seconds.  Neurological:     General: No focal deficit present.     Mental Status: He is alert and oriented to person, place, and time. Mental status is at baseline.     Sensory: Sensory deficit (feet) present.     Motor: Weakness (left side slight) present.  Psychiatric:        Mood and Affect: Mood normal.        Thought Content: Thought content normal.        Judgment: Judgment normal.  Diabetic Foot Exam - Simple   Simple Foot Form Diabetic Foot exam was performed with the following findings: Yes 03/21/2021 10:30 AM  Visual Inspection No deformities, no ulcerations, no other skin breakdown bilaterally: Yes Sensation Testing See comments: Yes Pulse Check Posterior Tibialis and Dorsalis pulse intact bilaterally: Yes Comments No sensation feet, he has diabetic shoes     Depression screen [facility] 2/9 03/21/2021 08/29/2020 03/10/2020 11/13/2016 06/18/2016  Decreased Interest 2 0 0 3 3  Down, Depressed, Hopeless 0 1 0 1 3  PHQ - 2 Score 2 1 0 4 6  Altered sleeping 1 - - 3 2  Tired, decreased energy 0 - - 1 2  Change in appetite 0 - - 0 0  Feeling bad or failure about yourself  0 - - 3 2  Trouble concentrating 0 - - 0 1  Moving slowly or fidgety/restless 0 - - 0 -  Suicidal thoughts 0 - - 0 1  PHQ-9 Score 3 - - 11 14  Difficult doing work/chores Somewhat difficult - - Very difficult Very difficult     BP 110/60   Pulse 71   Temp 98.1 F (36.7 C)  Resp 16   Ht 6' (1.829 m)   Wt 208 lb (94.3 kg)   SpO2 96%   BMI 28.21 kg/m  Wt Readings from Last 3 Encounters:  03/21/21 208 lb (94.3 kg)  03/17/21 211 lb 12.8 oz (96.1 kg)  12/26/20 213 lb (96.6 kg)     Health Maintenance Due  Topic Date Due   OPHTHALMOLOGY EXAM  Never done   Hepatitis C Screening  Never done    TETANUS/TDAP  Never done   Zoster Vaccines- Shingrix (1 of 2) Never done   COVID-19 Vaccine (4 - Booster for Moderna series) 01/22/2021   HEMOGLOBIN A1C  03/17/2021    There are no preventive care reminders to display for this patient.  No results found for: TSH Lab Results  Component Value Date   WBC 9.3 09/16/2020   HGB 14.7 09/16/2020   HCT 43.1 09/16/2020   MCV 94 09/16/2020   PLT 291 09/16/2020   Lab Results  Component Value Date   NA 136 09/16/2020   K 4.3 09/16/2020   CO2 22 09/16/2020   GLUCOSE 349 (H) 09/16/2020   BUN 15 09/16/2020   CREATININE 1.01 09/16/2020   BILITOT 0.5 09/16/2020   ALKPHOS 97 09/16/2020   AST 20 09/16/2020   ALT 24 09/16/2020   PROT 6.8 09/16/2020   ALBUMIN 3.8 09/16/2020   CALCIUM 9.0 09/16/2020   ANIONGAP 8 06/16/2020   Lab Results  Component Value Date   CHOL 140 09/16/2020   Lab Results  Component Value Date   HDL 37 (L) 09/16/2020   Lab Results  Component Value Date   LDLCALC 79 09/16/2020   Lab Results  Component Value Date   TRIG 133 09/16/2020   Lab Results  Component Value Date   CHOLHDL 3.8 09/16/2020   Lab Results  Component Value Date   HGBA1C 9.0 (H) 09/16/2020      Assessment & Plan:   Problem List Items Addressed This Visit       Cardiovascular and Mediastinum   Essential hypertension An individual hypertension care plan was established and reinforced today.  The patient's status was assessed using clinical findings on exam and labs or diagnostic tests. The patient's success at meeting treatment goals on disease specific evidence-based guidelines and found to be well controlled. SELF MANAGEMENT: The patient and I together assessed ways to personally work towards obtaining the recommended goals. RECOMMENDATIONS: avoid decongestants found in common cold remedies, decrease consumption of alcohol, perform routine monitoring of BP with home BP cuff, exercise, reduction of dietary salt, take medicines as  prescribed, try not to miss doses and quit smoking.  Regular exercise and maintaining a healthy weight is needed.  Stress reduction may help. A CLINICAL SUMMARY including written plan identify barriers to care unique to individual due to social or financial issues.  We attempt to mutually creat solutions for individual and family understanding.     Atherosclerosis of renal artery (HCC) Atherosclerosis of renal artery has been stented     Respiratory   Obstructive chronic bronchitis without exacerbation [facility]) An individualize plan was formulated for care of COPD.  Treatment is evidence based.  She will continue on inhalers, avoid smoking and smoke.  Regular exercise with help with dyspnea. Routine follow ups and medication compliance is needed.      Endocrine   Diabetic polyneuropathy ([place]) - Primary   Relevant Orders   Comprehensive metabolic panel   Hemoglobin A1c   POCT UA - Microalbumin   CBC with  Differential/Platelet An individual care plan for diabetes was established and reinforced today.  The patient's status was assessed using clinical findings on exam, labs and diagnostic testing. Patient success at meeting goals based on disease specific evidence-based guidelines and found to be fair controlled. Medications were assessed and patient's understanding of the medical issues , including barriers were assessed. Recommend adherence to a diabetic diet, a graduated exercise program, HgbA1c level is checked quarterly, and urine microalbumin performed yearly .  Annual mono-filament sensation testing performed. Lower blood pressure and control hyperlipidemia is important. Get annual eye exams and annual flu shots and smoking cessation discussed.  Self management goals were discussed.     Diabetic retinopathy ([place]) Patient has diabetic retinopathy and had surgery     Nervous and Auditory   Hemiparesis affecting left side as late effect of stroke [facility]) Patient was evaluated using information  from exam, tests and other diagnostic studies to perform evidence-based treatment for this disorder.  Opimizing treatment and improvement of neurologic deficits.  Patient is using cane to maintain as much independence as possible. Patient using cane.  Patient has full ability to perform ADLs.      Other   BMI 28.0-28.9,adult BMI is stable and recommend increase exercise    Long-term insulin use (HCC) Chronic long term insulin use for DM   Other Visit Diagnoses     Mixed hyperlipidemia       Relevant Orders   Lipid panel AN INDIVIDUAL CARE PLAN for hyperlipidemia/ cholesterol was established and reinforced today.  The patient's status was assessed using clinical findings on exam, lab and other diagnostic tests. The patient's disease status was assessed based on evidence-based guidelines and found to be fair controlled. MEDICATIONS were reviewed. SELF MANAGEMENT GOALS have been discussed and patient's success at attaining the goal of low cholesterol was assessed. RECOMMENDATION given include regular exercise 3 days a week and low cholesterol/low fat diet. CLINICAL SUMMARY including written plan to identify barriers unique to the patient due to social or economic  reasons was discussed.     Major depressive disorder, recurrent episode, moderate ([place])     Patient's depression is controlled with buproprion.   Anhedonia better.  PHQ 9 was performed score 3. An individual care plan was established or reinforced today.  The patient's disease status was assessed using clinical findings on exam, labs, and or other diagnostic testing to determine patient's success in meeting treatment goals based on disease specific evidence-based guidelines and found to be improving Recommendations include stay on medicines     Hypovitaminosis D       Relevant Orders   VITAMIN D 25 Hydroxy (Vit-D Deficiency, Fractures) Patient is on vitamin D supplementation          Follow-up: Return in about 4 months  (around 07/21/2021) for fasting.    Reinaldo Meeker, MD

## 2021-03-22 LAB — CBC WITH DIFFERENTIAL/PLATELET
Basophils Absolute: 0 10*3/uL (ref 0.0–0.2)
Basos: 1 %
EOS (ABSOLUTE): 0.4 10*3/uL (ref 0.0–0.4)
Eos: 7 %
Hematocrit: 44.5 % (ref 37.5–51.0)
Hemoglobin: 15 g/dL (ref 13.0–17.7)
Immature Grans (Abs): 0 10*3/uL (ref 0.0–0.1)
Immature Granulocytes: 0 %
Lymphocytes Absolute: 1.3 10*3/uL (ref 0.7–3.1)
Lymphs: 22 %
MCH: 30.9 pg (ref 26.6–33.0)
MCHC: 33.7 g/dL (ref 31.5–35.7)
MCV: 92 fL (ref 79–97)
Monocytes Absolute: 0.6 10*3/uL (ref 0.1–0.9)
Monocytes: 9 %
Neutrophils Absolute: 3.7 10*3/uL (ref 1.4–7.0)
Neutrophils: 61 %
Platelets: 260 10*3/uL (ref 150–450)
RBC: 4.86 x10E6/uL (ref 4.14–5.80)
RDW: 13.2 % (ref 11.6–15.4)
WBC: 6.1 10*3/uL (ref 3.4–10.8)

## 2021-03-22 LAB — LIPID PANEL
Chol/HDL Ratio: 4.3 ratio (ref 0.0–5.0)
Cholesterol, Total: 137 mg/dL (ref 100–199)
HDL: 32 mg/dL — ABNORMAL LOW (ref >39)
LDL Chol Calc (NIH): 83 mg/dL (ref 0–99)
Triglycerides: 124 mg/dL (ref 0–149)
VLDL Cholesterol Cal: 22 mg/dL (ref 5–40)

## 2021-03-22 LAB — HEMOGLOBIN A1C
Est. average glucose Bld gHb Est-mCnc: 200 mg/dL
Hgb A1c MFr Bld: 8.6 % — ABNORMAL HIGH (ref 4.8–5.6)

## 2021-03-22 LAB — VITAMIN D 25 HYDROXY (VIT D DEFICIENCY, FRACTURES): Vit D, 25-Hydroxy: 24.2 ng/mL — ABNORMAL LOW (ref 30.0–100.0)

## 2021-03-22 LAB — COMPREHENSIVE METABOLIC PANEL
ALT: 15 IU/L (ref 0–44)
AST: 13 IU/L (ref 0–40)
Albumin/Globulin Ratio: 1.3 (ref 1.2–2.2)
Albumin: 3.9 g/dL (ref 3.7–4.7)
Alkaline Phosphatase: 89 IU/L (ref 44–121)
BUN/Creatinine Ratio: 20 (ref 10–24)
BUN: 24 mg/dL (ref 8–27)
Bilirubin Total: 0.6 mg/dL (ref 0.0–1.2)
CO2: 22 mmol/L (ref 20–29)
Calcium: 9.3 mg/dL (ref 8.6–10.2)
Chloride: 104 mmol/L (ref 96–106)
Creatinine, Ser: 1.2 mg/dL (ref 0.76–1.27)
Globulin, Total: 3 g/dL (ref 1.5–4.5)
Glucose: 121 mg/dL — ABNORMAL HIGH (ref 65–99)
Potassium: 4 mmol/L (ref 3.5–5.2)
Sodium: 139 mmol/L (ref 134–144)
Total Protein: 6.9 g/dL (ref 6.0–8.5)
eGFR: 63 mL/min/{1.73_m2} (ref >59)

## 2021-03-22 LAB — CARDIOVASCULAR RISK ASSESSMENT

## 2021-03-23 DIAGNOSIS — G4733 Obstructive sleep apnea (adult) (pediatric): Secondary | ICD-10-CM | POA: Diagnosis not present

## 2021-03-23 DIAGNOSIS — E118 Type 2 diabetes mellitus with unspecified complications: Secondary | ICD-10-CM | POA: Diagnosis not present

## 2021-03-31 DIAGNOSIS — S93402A Sprain of unspecified ligament of left ankle, initial encounter: Secondary | ICD-10-CM | POA: Diagnosis not present

## 2021-04-06 ENCOUNTER — Encounter: Payer: Self-pay | Admitting: Podiatry

## 2021-04-06 ENCOUNTER — Other Ambulatory Visit: Payer: Self-pay

## 2021-04-06 ENCOUNTER — Ambulatory Visit: Payer: HMO | Admitting: Podiatry

## 2021-04-06 ENCOUNTER — Telehealth: Payer: Self-pay

## 2021-04-06 DIAGNOSIS — B351 Tinea unguium: Secondary | ICD-10-CM

## 2021-04-06 DIAGNOSIS — E1169 Type 2 diabetes mellitus with other specified complication: Secondary | ICD-10-CM

## 2021-04-06 DIAGNOSIS — E1142 Type 2 diabetes mellitus with diabetic polyneuropathy: Secondary | ICD-10-CM | POA: Diagnosis not present

## 2021-04-06 NOTE — Telephone Encounter (Signed)
Kidney an liver tests normal, CBC normal, cholesterol good, A1c 8.6 we want it <8, vitamin d is still low side- take 2000iu a day lp

## 2021-04-06 NOTE — Telephone Encounter (Signed)
I tried to call the pt back regarding his blood work.  However, I was unable to leave a message, his mailbox was full.

## 2021-04-06 NOTE — Telephone Encounter (Signed)
Pt called this morning requesting his blood work results. Pt stated that he called last week but the results have not been signed off. Dr. Henrene Pastor please review his blood work. Pt was notified that someone will call him back today with the results.

## 2021-04-06 NOTE — Progress Notes (Signed)
  Subjective:  Patient ID: Mark Stewart, male    DOB: [birth year],  MRN: [id number]  75 y.o. male presents at risk foot care with history of diabetic neuropathy and painful thick toenails that are difficult to trim. Pain interferes with ambulation. Aggravating factors include wearing enclosed shoe gear. Pain is relieved with periodic professional debridement.  Patient states blood glucose was 94 mg/dl today.  He states he sprained his left ankle falling down the steps last week. He was seen at Urgent Care and xrays were negative for fracture. He states it is getting better now.  PCP is Lillard Anes, MD , and last visit was 03/21/2021.  Allergies  Allergen Reactions   Benazepril Cough   Metformin And Related Rash   Penicillins Rash    Has patient had a PCN reaction causing immediate rash, facial/tongue/throat swelling, SOB or lightheadedness with hypotension: yes Has patient had a PCN reaction causing severe rash involving mucus membranes or skin necrosis: no Has patient had a PCN reaction that required hospitalization: no Has patient had a PCN reaction occurring within the last 10 years: no If all of the above answers are "NO", then may proceed with Cephalosporin use.     Review of Systems: Negative except as noted in the HPI.   Objective:  Vascular Examination: Vascular status intact b/l with palpable pedal pulses. No edema. No pain with calf compression b/l. Skin temperature gradient WNL b/l. Capillary refill time to digits immediate b/l. Pedal hair absent. Lower extremity skin temperature gradient within normal limits. Nonpitting edema noted left ankle.  Neurological Examination: Protective sensation diminished with 10g monofilament b/l. Vibratory sensation diminished b/l.  Dermatological Examination: Pedal skin with normal turgor, texture and tone b/l.  Toenails 1-5 b/l thick, discolored, elongated with subungual debris and pain on dorsal palpation.  No  hyperkeratotic lesions noted b/l.  Pedal skin with normal turgor, texture and tone b/l lower extremities No open wounds b/l lower extremities No interdigital macerations b/l lower extremities Toenails 1-5 b/l elongated, discolored, dystrophic, thickened, crumbly with subungual debris and tenderness to dorsal palpation.  Musculoskeletal Examination: Muscle strength 5/5 to b/l LE. No gross bony deformities b/l. Normal muscle strength 5/5 to all lower extremity muscle groups bilaterally. Hammertoe(s) noted to the b/l lower extremities. Pes planus deformity noted b/l.   Radiographs: None Assessment:   1. Onychomycosis of multiple toenails with type 2 diabetes mellitus and peripheral neuropathy ([place])   2. Diabetic polyneuropathy associated with type 2 diabetes mellitus ([place])    Plan:  Patient was evaluated and treated and all questions answered.  Onychomycosis with pain -Nails palliatively debridement as below. -Educated on self-care  Procedure: Nail Debridement Rationale: Pain Type of Debridement: manual, sharp debridement. Instrumentation: Nail nipper, rotary burr. Number of Nails: 10  -Examined patient. -Continue diabetic foot care principles. -Patient to continue soft, supportive shoe gear daily. -Toenails 1-5 b/l were debrided in length and girth with sterile nail nippers and dremel without iatrogenic bleeding.  -Patient to report any pedal injuries to medical professional immediately. -Patient/POA to call should there be question/concern in the interim.  Return in about 3 months (around 07/07/2021).  Marzetta Board, DPM

## 2021-04-09 DIAGNOSIS — G4733 Obstructive sleep apnea (adult) (pediatric): Secondary | ICD-10-CM | POA: Diagnosis not present

## 2021-04-13 NOTE — Telephone Encounter (Signed)
Patient was informed.

## 2021-04-18 DIAGNOSIS — S8262XA Displaced fracture of lateral malleolus of left fibula, initial encounter for closed fracture: Secondary | ICD-10-CM | POA: Diagnosis not present

## 2021-04-22 ENCOUNTER — Other Ambulatory Visit: Payer: Self-pay | Admitting: Legal Medicine

## 2021-04-22 DIAGNOSIS — J449 Chronic obstructive pulmonary disease, unspecified: Secondary | ICD-10-CM

## 2021-04-24 DIAGNOSIS — E118 Type 2 diabetes mellitus with unspecified complications: Secondary | ICD-10-CM | POA: Diagnosis not present

## 2021-05-03 ENCOUNTER — Encounter: Payer: Self-pay | Admitting: Legal Medicine

## 2021-05-03 ENCOUNTER — Ambulatory Visit (INDEPENDENT_AMBULATORY_CARE_PROVIDER_SITE_OTHER): Payer: HMO | Admitting: Legal Medicine

## 2021-05-03 ENCOUNTER — Ambulatory Visit: Payer: HMO | Admitting: Legal Medicine

## 2021-05-03 VITALS — BP 142/75 | HR 66 | Temp 98.0°F | Ht 72.0 in | Wt 208.0 lb

## 2021-05-03 DIAGNOSIS — U071 COVID-19: Secondary | ICD-10-CM

## 2021-05-03 MED ORDER — MOLNUPIRAVIR EUA 200MG CAPSULE: 4.0000 | ORAL_CAPSULE | Freq: Two times a day (BID) | ORAL | 0 refills | Status: AC

## 2021-05-03 NOTE — Progress Notes (Signed)
Virtual Visit via Video Note   This visit type was conducted due to national recommendations for restrictions regarding the COVID-19 Pandemic (e.g. social distancing) in an effort to limit this patient's exposure and mitigate transmission in our community.  Due to his co-morbid illnesses, this patient is at least at moderate risk for complications without adequate follow up.  This format is felt to be most appropriate for this patient at this time.  All issues noted in this document were discussed and addressed.  A limited physical exam was performed with this format.  A verbal consent was obtained for the virtual visit.   Date:  05/03/2021   ID:  Mark Stewart, DOB [birth year], MRN [id number]  Patient Location: Home Provider Location: Home Office  PCP:  Mark Anes, MD   Evaluation Performed:  New Patient Evaluation  Chief Complaint:  positive Covid  History of Present Illness:    Mark Stewart is a 75 y.o. male with cough, no appetite, no fever glucose high. No respiratory.  The patient does have symptoms concerning for COVID-19 infection (fever, chills, cough, or new shortness of breath).    Past Medical History:  Diagnosis Date   Allergy    Cancer ([place])    skin, hand   Cataract    history of surgery bilaterally   Hypercholesteremia    Hypertension    ICAO (internal carotid artery occlusion)    Spleen absent    patient states had spleen removed in high school   Stroke [facility]) 02/2016    Past Surgical History:  Procedure Laterality Date   EYE SURGERY     HERNIA REPAIR  02/28/2018   HERNIA REPAIR Bilateral 11/2019   PROSTATE SURGERY     SPLENECTOMY      Family History  Problem Relation Age of Onset   Breast cancer Mother    Diabetes Mellitus II Father    Breast cancer Sister     Social History   Socioeconomic History   Marital status: Married    Spouse name: graye   Number of children: Not on file   Years of education: Not on file   Highest  education level: Not on file  Occupational History   Not on file  Tobacco Use   Smoking status: Former    Packs/day: 1.00    Years: 20.00    Pack years: 20.00    Types: Cigarettes   Smokeless tobacco: Never   Tobacco comments:    patient states last cigarette 35 years ago  Substance and Sexual Activity   Alcohol use: No   Drug use: No   Sexual activity: Not Currently  Other Topics Concern   Not on file  Social History Narrative   Lives with wife   Right Handed   Drinks 4-5 cups caffeine daily   Social Determinants of Health   Financial Resource Strain: Low Risk    Difficulty of Paying Living Expenses: Not hard at all  Food Insecurity: No Food Insecurity   Worried About Charity fundraiser in the Last Year: Never true   Ran Out of Food in the Last Year: Never true  Transportation Needs: No Transportation Needs   Lack of Transportation (Medical): No   Lack of Transportation (Non-Medical): No  Physical Activity: Not on file  Stress: No Stress Concern Present   Feeling of Stress : Not at all  Social Connections: Moderately Integrated   Frequency of Communication with Friends and Family: Twice a week  Frequency of Social Gatherings with Friends and Family: Twice a week   Attends Religious Services: More than 4 times per year   Active Member of [employer] or Organizations: No   Attends Music therapist: Never   Marital Status: Married  Human resources officer Violence: Not At Risk   Fear of Current or Ex-Partner: No   Emotionally Abused: No   Physically Abused: No   Sexually Abused: No    Outpatient Medications Prior to Visit  Medication Sig Dispense Refill   amLODipine (NORVASC) 10 MG tablet Take 1 tablet (10 mg total) by mouth daily. 90 tablet 2   aspirin EC 81 MG tablet Take 81 mg by mouth daily.     atorvastatin (LIPITOR) 80 MG tablet Take 1 tablet (80 mg total) by mouth daily. 90 tablet 2   buPROPion (WELLBUTRIN XL) 150 MG 24 hr tablet Take 150 mg by mouth  daily.     carvedilol (COREG) 25 MG tablet TAKE 2 TABLETS (50 MG TOTAL) BY MOUTH 2 (TWO) TIMES DAILY. CALL FOR APPOINTMENT 360 tablet 1   cloNIDine (CATAPRES - DOSED IN MG/24 HR) 0.2 mg/24hr patch PLACE 1 PATCH (0.2 MG TOTAL) ONTO THE SKIN ONCE A WEEK. 12 patch 2   cyclobenzaprine (FLEXERIL) 10 MG tablet Take 1 tablet (10 mg total) by mouth 3 (three) times daily as needed for muscle spasms. 30 tablet 2   empagliflozin (JARDIANCE) 25 MG TABS tablet Take 25 mg by mouth daily.      fluticasone (FLONASE) 50 MCG/ACT nasal spray Place 2 sprays into both nostrils daily as needed for allergies or rhinitis. Reported on 04/26/2016 18.2 mL 6   hydrochlorothiazide (HYDRODIURIL) 25 MG tablet Take 1 tablet (25 mg total) by mouth daily. 90 tablet 2   Insulin Disposable Pump (OMNIPOD DASH 5 PACK PODS) MISC      minoxidil (LONITEN) 10 MG tablet TAKE 1/2 TABLET BY MOUTH EVERY DAY 45 tablet 2   NOVOLOG 100 UNIT/ML injection      omeprazole (PRILOSEC) 40 MG capsule TAKE 1 CAPSULE BY MOUTH EVERY DAY 90 capsule 2   spironolactone (ALDACTONE) 50 MG tablet TAKE 1 TABLET BY MOUTH EVERY DAY 90 tablet 3   valsartan (DIOVAN) 320 MG tablet TAKE 1 TABLET BY MOUTH EVERY DAY 90 tablet 2   No facility-administered medications prior to visit.    Allergies:   Benazepril, Metformin and related, and Penicillins   Social History   Tobacco Use   Smoking status: Former    Packs/day: 1.00    Years: 20.00    Pack years: 20.00    Types: Cigarettes   Smokeless tobacco: Never   Tobacco comments:    patient states last cigarette 35 years ago  Substance Use Topics   Alcohol use: No   Drug use: No     Review of Systems  Constitutional:  Negative for chills and fever.  HENT: Negative.  Negative for hearing loss.   Eyes:  Negative for redness.  Respiratory:  Positive for cough. Negative for hemoptysis.   Cardiovascular:  Negative for chest pain and orthopnea.  Gastrointestinal: Negative.   Genitourinary: Negative.    Musculoskeletal:  Positive for myalgias.  Skin: Negative.  Negative for rash.  Neurological: Negative.     Labs/Other Tests and Data Reviewed:    Recent Labs: 05/06/2020: Magnesium 2.1 03/21/2021: ALT 15; BUN 24; Creatinine, Ser 1.20; Hemoglobin 15.0; Platelets 260; Potassium 4.0; Sodium 139   Recent Lipid Panel Lab Results  Component Value Date/Time   CHOL  137 03/21/2021 12:00 AM   TRIG 124 03/21/2021 12:00 AM   HDL 32 (L) 03/21/2021 12:00 AM   CHOLHDL 4.3 03/21/2021 12:00 AM   CHOLHDL 3.6 02/27/2016 05:36 AM   LDLCALC 83 03/21/2021 12:00 AM    Wt Readings from Last 3 Encounters:  05/03/21 208 lb (94.3 kg)  03/21/21 208 lb (94.3 kg)  03/17/21 211 lb 12.8 oz (96.1 kg)     Objective:    Vital Signs:  BP (!) 142/75   Pulse 66   Temp 98 F (36.7 C)   Ht 6' (1.829 m)   Wt 208 lb (94.3 kg)   BMI 28.21 kg/m    Physical Exam reviewed  ASSESSMENT & PLAN:   1. COVID - molnupiravir EUA 200 mg CAPS; Take 4 capsules (800 mg total) by mouth 2 (two) times daily for 5 days.  Dispense: 40 capsule; Refill: 0  Patient has positive Covid, treat with molnupiravir     Meds ordered this encounter  Medications   molnupiravir EUA 200 mg CAPS    Sig: Take 4 capsules (800 mg total) by mouth 2 (two) times daily for 5 days.    Dispense:  40 capsule    Refill:  0    COVID-19 Education: The signs and symptoms of COVID-19 were discussed with the patient and how to seek care for testing (follow up with PCP or arrange E-visit). The importance of social distancing was discussed today.   I spent 20 minutes dedicated to the care of this patient on the date of this encounter to include face-to-face time with the patient, as well as:   Follow Up:  In Person prn  Signed, Reinaldo Meeker, MD  05/03/2021 9:31 AM    [place]

## 2021-05-07 ENCOUNTER — Other Ambulatory Visit: Payer: Self-pay | Admitting: Cardiovascular Disease

## 2021-05-10 DIAGNOSIS — G4733 Obstructive sleep apnea (adult) (pediatric): Secondary | ICD-10-CM | POA: Diagnosis not present

## 2021-05-23 DIAGNOSIS — S8262XA Displaced fracture of lateral malleolus of left fibula, initial encounter for closed fracture: Secondary | ICD-10-CM | POA: Diagnosis not present

## 2021-06-06 DIAGNOSIS — E1142 Type 2 diabetes mellitus with diabetic polyneuropathy: Secondary | ICD-10-CM | POA: Diagnosis not present

## 2021-06-06 DIAGNOSIS — E7849 Other hyperlipidemia: Secondary | ICD-10-CM | POA: Diagnosis not present

## 2021-06-06 DIAGNOSIS — Z794 Long term (current) use of insulin: Secondary | ICD-10-CM | POA: Diagnosis not present

## 2021-06-06 DIAGNOSIS — Z7984 Long term (current) use of oral hypoglycemic drugs: Secondary | ICD-10-CM | POA: Diagnosis not present

## 2021-06-06 DIAGNOSIS — Z8673 Personal history of transient ischemic attack (TIA), and cerebral infarction without residual deficits: Secondary | ICD-10-CM | POA: Diagnosis not present

## 2021-06-06 DIAGNOSIS — Z9641 Presence of insulin pump (external) (internal): Secondary | ICD-10-CM | POA: Diagnosis not present

## 2021-06-06 DIAGNOSIS — E118 Type 2 diabetes mellitus with unspecified complications: Secondary | ICD-10-CM | POA: Diagnosis not present

## 2021-06-09 DIAGNOSIS — M1712 Unilateral primary osteoarthritis, left knee: Secondary | ICD-10-CM | POA: Diagnosis not present

## 2021-06-15 ENCOUNTER — Other Ambulatory Visit: Payer: Self-pay | Admitting: Cardiovascular Disease

## 2021-06-15 DIAGNOSIS — E118 Type 2 diabetes mellitus with unspecified complications: Secondary | ICD-10-CM | POA: Diagnosis not present

## 2021-06-21 DIAGNOSIS — G4733 Obstructive sleep apnea (adult) (pediatric): Secondary | ICD-10-CM | POA: Diagnosis not present

## 2021-06-22 DIAGNOSIS — S8262XA Displaced fracture of lateral malleolus of left fibula, initial encounter for closed fracture: Secondary | ICD-10-CM | POA: Diagnosis not present

## 2021-07-03 ENCOUNTER — Telehealth: Payer: Self-pay

## 2021-07-03 ENCOUNTER — Ambulatory Visit: Payer: HMO | Admitting: Adult Health

## 2021-07-03 ENCOUNTER — Encounter: Payer: Self-pay | Admitting: Adult Health

## 2021-07-03 VITALS — BP 197/99 | HR 61 | Ht 72.0 in | Wt 214.0 lb

## 2021-07-03 DIAGNOSIS — Z9989 Dependence on other enabling machines and devices: Secondary | ICD-10-CM | POA: Diagnosis not present

## 2021-07-03 DIAGNOSIS — G4730 Sleep apnea, unspecified: Secondary | ICD-10-CM | POA: Diagnosis not present

## 2021-07-03 DIAGNOSIS — G4733 Obstructive sleep apnea (adult) (pediatric): Secondary | ICD-10-CM | POA: Diagnosis not present

## 2021-07-03 NOTE — Telephone Encounter (Signed)
Per Janett Billow NP  orman Mathisen is being seen this morning for CPAP f/u. he has not been using in at least the past 90 days - we have previously discussed intolerance to it but prior visit he was doing okay - can he be contacted to see if he wishes to restart therapy, has specific concerns with use (and wants to discuss) or if he doesn't want to use anymore and in that care, visit can be cancelled. thank you!  LVM asking for CB from the pt.  Phone staff can relay message from NP and discuss scheduled appt for today.

## 2021-07-03 NOTE — Progress Notes (Signed)
[facility] [address] [place]. [place] [address] [phone]       OFFICE FOLLOW UP NOTE  [name] Date of Birth:  09-Sep-1946 Medical Record Number:  [id number]   Reason for Referral:  CPAP compliance visit     SUBJECTIVE:   CHIEF COMPLAINT:  Chief Complaint  Patient presents with   Follow-up    RM 7 with spouse  graye Pt is well, doesn't see CPAP is helping but has been using nightly      HPI:   Mark Stewart is a 75 y.o. male with PMHx of HTN, HLD, DM, hx of stroke 2017, OSA, b/l ICA stenosis and intracranial atherosclerotic disease.  Reevaluated by Dr. Rexene Alberts on 07/25/2020 for repeat sleep study previously followed by Dr. Rexene Alberts with noted CPAP noncompliance.  Sleep study 2019 total AHI 11.1/h.  Recent HST 08/2020 showed severe OSA with total AHI of 51.7/h and recommend initiation of AutoPap titration/trial.  Returns today, 07/03/2021, returns for 46-month CPAP follow-up accompanied by his wife. continues to experience day time fatigue with napping easily. Some difficulty initiating sleep (due to use of CPAP mask) but once asleep, stays asleep al night. Wife does not occasional leaks around his mask. He recently received new mask but has not yet replaced prior mask.  Epworth Sleepiness Scale 22 (prior 20)            ROS:   14 system review of systems performed and negative with exception of those listed in HPI  PMH:  Past Medical History:  Diagnosis Date   Allergy    Cancer ([place])    skin, hand   Cataract    history of surgery bilaterally   Hypercholesteremia    Hypertension    ICAO (internal carotid artery occlusion)    Spleen absent    patient states had spleen removed in high school   Stroke [facility]) 02/2016    PSH:  Past Surgical History:  Procedure Laterality Date   EYE SURGERY     HERNIA REPAIR  02/28/2018   HERNIA REPAIR Bilateral 11/2019   PROSTATE SURGERY     SPLENECTOMY      Social History:  Social  History   Socioeconomic History   Marital status: Married    Spouse name: graye   Number of children: Not on file   Years of education: Not on file   Highest education level: Not on file  Occupational History   Not on file  Tobacco Use   Smoking status: Former    Packs/day: 1.00    Years: 20.00    Pack years: 20.00    Types: Cigarettes   Smokeless tobacco: Never   Tobacco comments:    patient states last cigarette 35 years ago  Substance and Sexual Activity   Alcohol use: No   Drug use: No   Sexual activity: Not Currently  Other Topics Concern   Not on file  Social History Narrative   Lives with wife   Right Handed   Drinks 4-5 cups caffeine daily   Social Determinants of Health   Financial Resource Strain: Low Risk    Difficulty of Paying Living Expenses: Not hard at all  Food Insecurity: No Food Insecurity   Worried About Charity fundraiser in the Last Year: Never true   Ran Out of Food in the Last Year: Never true  Transportation Needs: No Transportation Needs   Lack of Transportation (Medical): No   Lack of Transportation (Non-Medical): No  Physical Activity: Not on file  Stress: No Stress Concern Present   Feeling of Stress : Not at all  Social Connections: Moderately Integrated   Frequency of Communication with Friends and Family: Twice a week   Frequency of Social Gatherings with Friends and Family: Twice a week   Attends Religious Services: More than 4 times per year   Active Member of [employer] or Organizations: No   Attends Music therapist: Never   Marital Status: Married  Human resources officer Violence: Not At Risk   Fear of Current or Ex-Partner: No   Emotionally Abused: No   Physically Abused: No   Sexually Abused: No    Family History:  Family History  Problem Relation Age of Onset   Breast cancer Mother    Diabetes Mellitus II Father    Breast cancer Sister     Medications:   Current Outpatient Medications on File Prior to Visit   Medication Sig Dispense Refill   amLODipine (NORVASC) 10 MG tablet Take 1 tablet (10 mg total) by mouth daily. 90 tablet 2   aspirin EC 81 MG tablet Take 81 mg by mouth daily.     atorvastatin (LIPITOR) 80 MG tablet Take 1 tablet (80 mg total) by mouth daily. 90 tablet 2   buPROPion (WELLBUTRIN XL) 150 MG 24 hr tablet Take 150 mg by mouth daily.     carvedilol (COREG) 25 MG tablet TAKE 2 TABLETS BY MOUTH 2 TIMES DAILY. CALL FOR APPOINTMENT 360 tablet 2   cloNIDine (CATAPRES - DOSED IN MG/24 HR) 0.2 mg/24hr patch PLACE 1 PATCH (0.2 MG TOTAL) ONTO THE SKIN ONCE A WEEK. 12 patch 2   empagliflozin (JARDIANCE) 25 MG TABS tablet Take 25 mg by mouth daily.      fluticasone (FLONASE) 50 MCG/ACT nasal spray Place 2 sprays into both nostrils daily as needed for allergies or rhinitis. Reported on 04/26/2016 18.2 mL 6   hydrochlorothiazide (HYDRODIURIL) 25 MG tablet Take 1 tablet (25 mg total) by mouth daily. 90 tablet 2   Insulin Disposable Pump (OMNIPOD DASH 5 PACK PODS) MISC      minoxidil (LONITEN) 10 MG tablet TAKE 1/2 TABLET BY MOUTH EVERY DAY 45 tablet 2   NOVOLOG 100 UNIT/ML injection      omeprazole (PRILOSEC) 40 MG capsule TAKE 1 CAPSULE BY MOUTH EVERY DAY 90 capsule 2   spironolactone (ALDACTONE) 50 MG tablet TAKE 1 TABLET BY MOUTH EVERY DAY 90 tablet 3   valsartan (DIOVAN) 320 MG tablet TAKE 1 TABLET BY MOUTH EVERY DAY 90 tablet 2   No current facility-administered medications on file prior to visit.    Allergies:   Allergies  Allergen Reactions   Benazepril Cough   Metformin And Related Rash   Penicillins Rash    Has patient had a PCN reaction causing immediate rash, facial/tongue/throat swelling, SOB or lightheadedness with hypotension: yes Has patient had a PCN reaction causing severe rash involving mucus membranes or skin necrosis: no Has patient had a PCN reaction that required hospitalization: no Has patient had a PCN reaction occurring within the last 10 years: no If all of the  above answers are "NO", then may proceed with Cephalosporin use.       OBJECTIVE:  Physical Exam  Vitals:   07/03/21 1011  BP: (!) 197/99  Pulse: 61  Weight: 214 lb (97.1 kg)  Height: 6' (1.829 m)    Body mass index is 29.02 kg/m. No results found.  General: well developed, well nourished,  pleasant elderly Caucasian male, seated, in no evident distress Head: head normocephalic and atraumatic.   Neck: supple with no carotid or supraclavicular bruits Cardiovascular: regular rate and rhythm, no murmurs Musculoskeletal: no deformity Skin:  no rash/petichiae Vascular:  Normal pulses all extremities   Neurologic Exam Mental Status: Awake and fully alert.   Fluent speech and language.  Oriented to place and time. Recent and remote memory intact. Attention span, concentration and fund of knowledge appropriate. Mood and affect appropriate.  Cranial Nerves: Fundoscopic exam reveals sharp disc margins. Pupils equal, briskly reactive to light. Extraocular movements full without nystagmus. Visual fields full to confrontation. Hearing intact. Facial sensation intact. Face, tongue, palate moves normally and symmetrically.  Motor: Normal bulk and tone. Normal strength in all tested extremity muscles except slightly decreased left hand grip strength (chronic) Sensory.: intact to touch , pinprick , position and vibratory sensation.  Coordination: Rapid alternating movements normal in all extremities. Finger-to-nose and heel-to-shin performed accurately bilaterally. Gait and Station: Arises from chair without difficulty. Stance is normal. Gait demonstrates normal stride length without use of assistive device.   Reflexes: 1+ and symmetric. Toes downgoing.        ASSESSMENT/PLAN: Mark Stewart is a 75 y.o. year old male PHMx of prior stroke, HTN, HLD, DM, carotid stenosis and severe sleep apnea.  Prior sleep study 01/2018 showed mild sleep apnea with total AHI 11.1/h but noncompliant with  CPAP with difficulty tolerating.  Reestablished care with Dr. Rexene Alberts with HST 08/2020 showing severe obstructive sleep apnea with total AHI 51.7/h.       Severe OSA:  Compliance report: 83% compliance >4 hrs with residual AHI 1.1. Leak rate at 31.6L/min -advised him to ensure he replace his mask and if leaks continue to contact DME [employer].  Discussed importance of nightly compliance and to use throughout the entire duration while sleeping and with daytime napping Continues to c/o excessive daytime fatigue despite consistent use over the past 7 months - advised to f/u with PCP to discuss possible other causes of continued daytime fatigue Continue current settings: min pressure 6, max pressure 12 and EPR level 2 Continue to follow with DME company for any needed supplies or CPAP related concerns    Follow up in 1 year or call earlier if needed   CC:  [place] provider: Dr. Rexene Alberts PCP: Lillard Anes, MD    I spent 26 minutes of face-to-face and non-face-to-face time with patient and wife.  This included previsit chart review, lab review, study review, order entry, electronic health record documentation, patient education regarding diagnosis of severe sleep apnea and importance of CPAP machine, review and discussion of compliance report and continued daytime fatigue concerns and answered all other questions to patient and wife's satisfaction   [name], AGNP-BC  [facility] [address] [place], [place] [address]  Phone [phone] Fax [phone] Note: This document was prepared with digital dictation and possible smart phrase technology. Any transcriptional errors that result from this process are unintentional.  I reviewed the above note and documentation by the Nurse Practitioner and agree with the history, exam, assessment and plan as outlined above. I was available for consultation. Star Age, MD, PhD [facility]  [facility])

## 2021-07-03 NOTE — Patient Instructions (Addendum)
No changes today.  Please follow-up with your PCP Dr. Henrene Pastor to further discuss continued daytime fatigue

## 2021-07-04 ENCOUNTER — Telehealth: Payer: Self-pay | Admitting: *Deleted

## 2021-07-04 NOTE — Telephone Encounter (Signed)
Unable to reach patient. Called wife, LVM requesting call back.

## 2021-07-05 NOTE — Telephone Encounter (Signed)
Called wife, LVM  to advise patient f/u with PCP, Dr. Henrene Pastor to further discuss continued daytime fatigue as his apnea is being well treated so there may be underlying causes contributing.  Advised he Will also need to be scheduled for 1 year follow up. Left # for call back to schedule 1 year FU.

## 2021-07-10 ENCOUNTER — Telehealth: Payer: Self-pay | Admitting: *Deleted

## 2021-07-10 NOTE — Chronic Care Management (AMB) (Signed)
  Chronic Care Management   Note  07/10/2021 Name: KEYONTA BARRADAS MRN: [id number] DOB: 11-Oct-1945  Jamire Shabazz Suski is a 75 y.o. year old male who is a primary care patient of Lillard Anes, MD. I reached out to [employer] by phone today in response to a referral sent by Mr. Vong Garringer Deblois's PCP.  Mr. Fermin was given information about Chronic Care Management services today including:  CCM service includes personalized support from designated clinical staff supervised by his physician, including individualized plan of care and coordination with other care providers 24/7 contact phone numbers for assistance for urgent and routine care needs. Service will only be billed when office clinical staff spend 20 minutes or more in a month to coordinate care. Only one practitioner may furnish and bill the service in a calendar month. The patient may stop CCM services at any time (effective at the end of the month) by phone call to the office staff. The patient is responsible for co-pay (up to 20% after annual deductible is met) if co-pay is required by the individual health plan.   Patient agreed to services and verbal consent obtained.   Follow up plan: Telephone appointment with care management team member scheduled for:07/13/21  [place] [facility]  Direct Dial: [phone]

## 2021-07-13 ENCOUNTER — Ambulatory Visit: Payer: HMO

## 2021-07-13 ENCOUNTER — Other Ambulatory Visit: Payer: Self-pay | Admitting: Legal Medicine

## 2021-07-13 DIAGNOSIS — K2100 Gastro-esophageal reflux disease with esophagitis, without bleeding: Secondary | ICD-10-CM

## 2021-07-13 NOTE — Chronic Care Management (AMB) (Signed)
   07/13/2021  Mark Stewart 11-Oct-1945 [id number]  New referral.  Placed call to patient and he stopped me and states that he does not want to be charged for this service. I explained to patient that there is a charge and insurance is filled by MD office.  Patient reports to me that he has a nurse through [employer] for helps him with his chronic medical problems. Reports he is doing well and does not feel like he needs this service. I told patient I would let PCP know and if he changes his mind to let MD know and we would reopen to chronic care management.  Tomasa Rand RN, BSN, CEN RN Case Freight forwarder - [employer] exhibitions officer Mobile: [phone]

## 2021-07-13 NOTE — Telephone Encounter (Signed)
Refill sent to pharmacy.   

## 2021-07-17 DIAGNOSIS — E118 Type 2 diabetes mellitus with unspecified complications: Secondary | ICD-10-CM | POA: Diagnosis not present

## 2021-07-20 ENCOUNTER — Ambulatory Visit: Payer: HMO | Admitting: Podiatry

## 2021-07-20 DIAGNOSIS — L570 Actinic keratosis: Secondary | ICD-10-CM | POA: Diagnosis not present

## 2021-07-20 DIAGNOSIS — Z85820 Personal history of malignant melanoma of skin: Secondary | ICD-10-CM | POA: Diagnosis not present

## 2021-07-20 DIAGNOSIS — L578 Other skin changes due to chronic exposure to nonionizing radiation: Secondary | ICD-10-CM | POA: Diagnosis not present

## 2021-07-20 DIAGNOSIS — L821 Other seborrheic keratosis: Secondary | ICD-10-CM | POA: Diagnosis not present

## 2021-07-21 ENCOUNTER — Ambulatory Visit: Payer: Self-pay

## 2021-07-21 NOTE — Chronic Care Management (AMB) (Signed)
   07/21/2021  Dominie Benedick Vonada [birth year] [id number]  Patient refused CCM services.  Enrollment changed and updated.  Tomasa Rand RN, BSN, CEN RN Case Freight forwarder - [employer] officer Mobile: [phone]

## 2021-07-23 ENCOUNTER — Other Ambulatory Visit: Payer: Self-pay | Admitting: Legal Medicine

## 2021-07-24 ENCOUNTER — Ambulatory Visit (INDEPENDENT_AMBULATORY_CARE_PROVIDER_SITE_OTHER): Payer: HMO

## 2021-07-24 ENCOUNTER — Other Ambulatory Visit: Payer: Self-pay

## 2021-07-24 ENCOUNTER — Encounter: Payer: Self-pay | Admitting: Legal Medicine

## 2021-07-24 DIAGNOSIS — Z23 Encounter for immunization: Secondary | ICD-10-CM | POA: Diagnosis not present

## 2021-08-03 ENCOUNTER — Ambulatory Visit (INDEPENDENT_AMBULATORY_CARE_PROVIDER_SITE_OTHER): Payer: HMO | Admitting: Podiatry

## 2021-08-03 ENCOUNTER — Other Ambulatory Visit: Payer: Self-pay

## 2021-08-03 ENCOUNTER — Encounter: Payer: Self-pay | Admitting: Podiatry

## 2021-08-03 DIAGNOSIS — E1142 Type 2 diabetes mellitus with diabetic polyneuropathy: Secondary | ICD-10-CM | POA: Diagnosis not present

## 2021-08-03 DIAGNOSIS — B351 Tinea unguium: Secondary | ICD-10-CM

## 2021-08-03 DIAGNOSIS — E1169 Type 2 diabetes mellitus with other specified complication: Secondary | ICD-10-CM

## 2021-08-03 NOTE — Progress Notes (Signed)
  Subjective:  Patient ID: Mark Stewart, male    DOB: [birth year],  MRN: [id number]  75 y.o. male presents with at risk foot care with history of diabetic neuropathy and thick, elongated toenails both feet which are tender when wearing enclosed shoe gear..    Patient's blood sugar was 135 mg/dl this morning.    He voices no new pedal concerns on today's visit.  PCP: Lillard Anes, MD and last visit was: 05/03/2021.  Review of Systems: Negative except as noted in the HPI.   Allergies  Allergen Reactions   Benazepril Cough   Metformin And Related Rash   Penicillins Rash    Has patient had a PCN reaction causing immediate rash, facial/tongue/throat swelling, SOB or lightheadedness with hypotension: yes Has patient had a PCN reaction causing severe rash involving mucus membranes or skin necrosis: no Has patient had a PCN reaction that required hospitalization: no Has patient had a PCN reaction occurring within the last 10 years: no If all of the above answers are "NO", then may proceed with Cephalosporin use.     Objective:  There were no vitals filed for this visit. Constitutional Patient is a pleasant 75 y.o. Caucasian male WD, WN in NAD. AAO x 3.  Vascular Capillary fill time to digits immediate b/l.  DP/PT pulse(s) are palpable b/l lower extremities. Pedal hair absent. Lower extremity skin temperature gradient within normal limits. No pain with calf compression b/l. No edema noted b/l lower extremities. No cyanosis or clubbing noted.   Neurologic Protective sensation diminished with 10g monofilament b/l.  Dermatologic Pedal skin is warm and supple b/l LE. No open wounds b/l LE. No interdigital macerations noted b/l LE. Toenails 1-5 left and 1-5 right elongated, discolored, dystrophic, thickened, and crumbly with subungual debris and tenderness to dorsal palpation.  Orthopedic: Normal muscle strength 5/5 to all lower extremity muscle groups bilaterally. Hammertoe deformity  noted 2-5 b/l. Pes planus deformity noted b/l lower extremities.   Hemoglobin A1C Latest Ref Rng & Units 03/21/2021 09/16/2020  HGBA1C 4.8 - 5.6 % 8.6(H) 9.0(H)  Some recent data might be hidden   Assessment:   1. Onychomycosis of multiple toenails with type 2 diabetes mellitus and peripheral neuropathy ([place])   2. Diabetic polyneuropathy associated with type 2 diabetes mellitus ([place])    Plan:  Patient was evaluated and treated and all questions answered. Consent given for treatment as described below: -No new findings. No new orders. -Continue diabetic foot care principles: inspect feet daily, monitor glucose as recommended by PCP and/or Endocrinologist, and follow prescribed diet per PCP, Endocrinologist and/or dietician. -Mycotic toenails were debrided in length and girth with sterile nail nippers and dremel without iatrogenic bleeding. -Patient/POA to call should there be question/concern in the interim.  Return in about 3 months (around 11/03/2021).  Marzetta Board, DPM

## 2021-08-16 DIAGNOSIS — E118 Type 2 diabetes mellitus with unspecified complications: Secondary | ICD-10-CM | POA: Diagnosis not present

## 2021-08-30 ENCOUNTER — Encounter: Payer: Self-pay | Admitting: Legal Medicine

## 2021-08-30 ENCOUNTER — Other Ambulatory Visit: Payer: Self-pay

## 2021-08-30 ENCOUNTER — Ambulatory Visit (INDEPENDENT_AMBULATORY_CARE_PROVIDER_SITE_OTHER): Payer: HMO | Admitting: Legal Medicine

## 2021-08-30 VITALS — BP 112/60 | HR 64 | Temp 98.2°F | Resp 16 | Ht 72.0 in | Wt 222.0 lb

## 2021-08-30 DIAGNOSIS — N62 Hypertrophy of breast: Secondary | ICD-10-CM

## 2021-08-30 DIAGNOSIS — E3452 Partial androgen insensitivity syndrome: Secondary | ICD-10-CM | POA: Insufficient documentation

## 2021-08-30 NOTE — Progress Notes (Signed)
Acute Office Visit  Subjective:    Patient ID: Mark Stewart, male    DOB: Feb 28, 1946, 75 y.o.   MRN: [id number]  Chief Complaint  Patient presents with   Mass    HPI: Patient is in today for patient notes pain right chest wall.  It is tender.  Last year negative mammogram. The mass has grown to 6cm right breast and is tender. No discharge. Positive family history for breast cancer. Patient taking OTC testosterone supplement.  [facility] for [facility] control.  Past Medical History:  Diagnosis Date   Allergy    Cancer ([name])    skin, hand   Cataract    history of surgery bilaterally   Hypercholesteremia    Hypertension    ICAO (internal carotid artery occlusion)    Spleen absent    patient states had spleen removed in high school   Stroke [name]) 02/2016    Past Surgical History:  Procedure Laterality Date   EYE SURGERY     HERNIA REPAIR  02/28/2018   HERNIA REPAIR Bilateral 11/2019   PROSTATE SURGERY     SPLENECTOMY      Family History  Problem Relation Age of Onset   Breast cancer Mother    Diabetes Mellitus II Father    Breast cancer Sister     Social History   Socioeconomic History   Marital status: Married    Spouse name: graye   Number of children: Not on file   Years of education: Not on file   Highest education level: Not on file  Occupational History   Not on file  Tobacco Use   Smoking status: Former    Packs/day: 1.00    Years: 20.00    Pack years: 20.00    Types: Cigarettes   Smokeless tobacco: Never   Tobacco comments:    patient states last cigarette 35 years ago  Substance and Sexual Activity   Alcohol use: No   Drug use: No   Sexual activity: Not Currently  Other Topics Concern   Not on file  Social History Narrative   Lives with wife   Right Handed   Drinks 4-5 cups caffeine daily   Social Determinants of Health   Financial Resource Strain: Not on file  Food Insecurity: Not on file  Transportation Needs: Not on  file  Physical Activity: Not on file  Stress: Not on file  Social Connections: Not on file  Intimate Partner Violence: Not on file    Outpatient Medications Prior to Visit  Medication Sig Dispense Refill   amLODipine (NORVASC) 10 MG tablet Take 1 tablet (10 mg total) by mouth daily. 90 tablet 2   aspirin EC 81 MG tablet Take 81 mg by mouth daily.     atorvastatin (LIPITOR) 80 MG tablet TAKE 1 TABLET BY MOUTH EVERY DAY 90 tablet 2   buPROPion (WELLBUTRIN XL) 150 MG 24 hr tablet Take 150 mg by mouth daily.     carvedilol (COREG) 25 MG tablet TAKE 2 TABLETS BY MOUTH 2 TIMES DAILY. CALL FOR APPOINTMENT 360 tablet 2   cloNIDine (CATAPRES - DOSED IN MG/24 HR) 0.2 mg/24hr patch PLACE 1 PATCH (0.2 MG TOTAL) ONTO THE SKIN ONCE A WEEK. 12 patch 2   empagliflozin (JARDIANCE) 25 MG TABS tablet Take 25 mg by mouth daily.      fluticasone (FLONASE) 50 MCG/ACT nasal spray Place 2 sprays into both nostrils daily as needed for allergies or rhinitis. Reported on 04/26/2016 18.2  mL 6   hydrochlorothiazide (HYDRODIURIL) 25 MG tablet Take 1 tablet (25 mg total) by mouth daily. 90 tablet 2   Insulin Disposable Pump (OMNIPOD DASH 5 PACK PODS) MISC      minoxidil (LONITEN) 10 MG tablet TAKE 1/2 TABLET BY MOUTH EVERY DAY 45 tablet 2   NOVOLOG 100 UNIT/ML injection      omeprazole (PRILOSEC) 40 MG capsule TAKE 1 CAPSULE BY MOUTH EVERY DAY 90 capsule 2   spironolactone (ALDACTONE) 50 MG tablet TAKE 1 TABLET BY MOUTH EVERY DAY 90 tablet 3   valsartan (DIOVAN) 320 MG tablet TAKE 1 TABLET BY MOUTH EVERY DAY 90 tablet 2   No facility-administered medications prior to visit.    Allergies  Allergen Reactions   Benazepril Cough   Metformin And Related Rash   Penicillins Rash    Has patient had a PCN reaction causing immediate rash, facial/tongue/throat swelling, SOB or lightheadedness with hypotension: yes Has patient had a PCN reaction causing severe rash involving mucus membranes or skin necrosis: no Has patient  had a PCN reaction that required hospitalization: no Has patient had a PCN reaction occurring within the last 10 years: no If all of the above answers are "NO", then may proceed with Cephalosporin use.     Review of Systems  Constitutional:  Negative for chills, fatigue, fever and unexpected weight change.  HENT:  Negative for congestion, ear pain, sinus pain and sore throat.   Respiratory:  Negative for cough and shortness of breath.   Cardiovascular:  Negative for chest pain and palpitations.  Gastrointestinal:  Negative for abdominal pain, blood in stool, constipation, diarrhea, nausea and vomiting.  Endocrine: Negative for polydipsia.  Genitourinary:  Negative for dysuria.  Musculoskeletal:  Negative for back pain.  Skin:  Negative for rash.       Bump on right breast.  Neurological:  Negative for headaches.      Objective:    Physical Exam Vitals reviewed.  Constitutional:      Appearance: Normal appearance.  HENT:     Right Ear: Tympanic membrane normal.     Left Ear: Tympanic membrane normal.     Mouth/Throat:     Mouth: Mucous membranes are dry.  Eyes:     Extraocular Movements: Extraocular movements intact.     Conjunctiva/sclera: Conjunctivae normal.     Pupils: Pupils are equal, round, and reactive to light.  Cardiovascular:     Rate and Rhythm: Normal rate and regular rhythm.     Pulses: Normal pulses.     Heart sounds: Normal heart sounds. No murmur heard.   No gallop.  Pulmonary:     Effort: Pulmonary effort is normal. No respiratory distress.     Breath sounds: No wheezing.     Comments: 5 to 6 cm mass under right nipple.  Tender to touch, no hard but appears fibrocystic. Abdominal:     General: Abdomen is flat. There is no distension.     Palpations: Abdomen is soft.     Tenderness: There is no abdominal tenderness.  Skin:    General: Skin is warm.  Neurological:     Mental Status: He is alert.    BP 112/60   Pulse 64   Temp 98.2 F (36.8 C)    Resp 16   Ht 6' (1.829 m)   Wt 222 lb (100.7 kg)   SpO2 95%   BMI 30.11 kg/m  Wt Readings from Last 3 Encounters:  08/30/21 222 lb (100.7 kg)  07/03/21 214 lb (97.1 kg)  05/03/21 208 lb (94.3 kg)    Health Maintenance Due  Topic Date Due   OPHTHALMOLOGY EXAM  Never done   Hepatitis C Screening  Never done   TETANUS/TDAP  Never done   Zoster Vaccines- Shingrix (1 of 2) Never done   COVID-19 Vaccine (4 - Booster for Moderna series) 11/18/2020    There are no preventive care reminders to display for this patient.   No results found for: TSH Lab Results  Component Value Date   WBC 6.1 03/21/2021   HGB 15.0 03/21/2021   HCT 44.5 03/21/2021   MCV 92 03/21/2021   PLT 260 03/21/2021   Lab Results  Component Value Date   NA 139 03/21/2021   K 4.0 03/21/2021   CO2 22 03/21/2021   GLUCOSE 121 (H) 03/21/2021   BUN 24 03/21/2021   CREATININE 1.20 03/21/2021   BILITOT 0.6 03/21/2021   ALKPHOS 89 03/21/2021   AST 13 03/21/2021   ALT 15 03/21/2021   PROT 6.9 03/21/2021   ALBUMIN 3.9 03/21/2021   CALCIUM 9.3 03/21/2021   ANIONGAP 8 06/16/2020   EGFR 63 03/21/2021   Lab Results  Component Value Date   CHOL 137 03/21/2021   Lab Results  Component Value Date   HDL 32 (L) 03/21/2021   Lab Results  Component Value Date   LDLCALC 83 03/21/2021   Lab Results  Component Value Date   TRIG 124 03/21/2021   Lab Results  Component Value Date   CHOLHDL 4.3 03/21/2021   Lab Results  Component Value Date   HGBA1C 8.6 (H) 03/21/2021       Assessment & Plan:   Problem List Items Addressed This Visit   None Visit Diagnoses     Gynecomastia, male    -  Primary   Relevant Orders   Ambulatory referral to General Surgery Gynecomastia probably secondary to oral testosterone, stop oral pills, follow up with surgeon.             Follow-up: Return if symptoms worsen or fail to improve.  An After Visit Summary was printed and given to the patient.  Reinaldo Meeker, MD [facility] [phone]

## 2021-09-03 ENCOUNTER — Other Ambulatory Visit: Payer: Self-pay | Admitting: Cardiovascular Disease

## 2021-09-07 DIAGNOSIS — E113312 Type 2 diabetes mellitus with moderate nonproliferative diabetic retinopathy with macular edema, left eye: Secondary | ICD-10-CM | POA: Diagnosis not present

## 2021-09-07 DIAGNOSIS — H04123 Dry eye syndrome of bilateral lacrimal glands: Secondary | ICD-10-CM | POA: Diagnosis not present

## 2021-09-08 DIAGNOSIS — E1140 Type 2 diabetes mellitus with diabetic neuropathy, unspecified: Secondary | ICD-10-CM | POA: Diagnosis not present

## 2021-09-08 DIAGNOSIS — Z9641 Presence of insulin pump (external) (internal): Secondary | ICD-10-CM | POA: Diagnosis not present

## 2021-09-08 DIAGNOSIS — E7849 Other hyperlipidemia: Secondary | ICD-10-CM | POA: Diagnosis not present

## 2021-09-08 DIAGNOSIS — Z794 Long term (current) use of insulin: Secondary | ICD-10-CM | POA: Diagnosis not present

## 2021-09-08 DIAGNOSIS — Z7984 Long term (current) use of oral hypoglycemic drugs: Secondary | ICD-10-CM | POA: Diagnosis not present

## 2021-09-15 DIAGNOSIS — E118 Type 2 diabetes mellitus with unspecified complications: Secondary | ICD-10-CM | POA: Diagnosis not present

## 2021-09-25 DIAGNOSIS — N644 Mastodynia: Secondary | ICD-10-CM | POA: Diagnosis not present

## 2021-09-25 DIAGNOSIS — N62 Hypertrophy of breast: Secondary | ICD-10-CM | POA: Insufficient documentation

## 2021-10-12 DIAGNOSIS — E261 Secondary hyperaldosteronism: Secondary | ICD-10-CM | POA: Diagnosis not present

## 2021-10-12 DIAGNOSIS — E11649 Type 2 diabetes mellitus with hypoglycemia without coma: Secondary | ICD-10-CM | POA: Diagnosis not present

## 2021-10-12 DIAGNOSIS — E1122 Type 2 diabetes mellitus with diabetic chronic kidney disease: Secondary | ICD-10-CM | POA: Diagnosis not present

## 2021-10-12 DIAGNOSIS — I69354 Hemiplegia and hemiparesis following cerebral infarction affecting left non-dominant side: Secondary | ICD-10-CM | POA: Diagnosis not present

## 2021-10-12 DIAGNOSIS — I110 Hypertensive heart disease with heart failure: Secondary | ICD-10-CM | POA: Diagnosis not present

## 2021-10-12 DIAGNOSIS — E1165 Type 2 diabetes mellitus with hyperglycemia: Secondary | ICD-10-CM | POA: Diagnosis not present

## 2021-10-12 DIAGNOSIS — E11319 Type 2 diabetes mellitus with unspecified diabetic retinopathy without macular edema: Secondary | ICD-10-CM | POA: Diagnosis not present

## 2021-10-12 DIAGNOSIS — J449 Chronic obstructive pulmonary disease, unspecified: Secondary | ICD-10-CM | POA: Diagnosis not present

## 2021-10-12 DIAGNOSIS — F0280 Dementia in other diseases classified elsewhere without behavioral disturbance: Secondary | ICD-10-CM | POA: Diagnosis not present

## 2021-10-12 DIAGNOSIS — E1142 Type 2 diabetes mellitus with diabetic polyneuropathy: Secondary | ICD-10-CM | POA: Diagnosis not present

## 2021-10-12 DIAGNOSIS — E1169 Type 2 diabetes mellitus with other specified complication: Secondary | ICD-10-CM | POA: Diagnosis not present

## 2021-10-12 DIAGNOSIS — I701 Atherosclerosis of renal artery: Secondary | ICD-10-CM | POA: Diagnosis not present

## 2021-10-16 DIAGNOSIS — E118 Type 2 diabetes mellitus with unspecified complications: Secondary | ICD-10-CM | POA: Diagnosis not present

## 2021-11-13 DIAGNOSIS — N62 Hypertrophy of breast: Secondary | ICD-10-CM | POA: Diagnosis not present

## 2021-11-15 DIAGNOSIS — E118 Type 2 diabetes mellitus with unspecified complications: Secondary | ICD-10-CM | POA: Diagnosis not present

## 2021-11-17 DIAGNOSIS — M1712 Unilateral primary osteoarthritis, left knee: Secondary | ICD-10-CM | POA: Diagnosis not present

## 2021-11-23 ENCOUNTER — Ambulatory Visit (INDEPENDENT_AMBULATORY_CARE_PROVIDER_SITE_OTHER): Payer: HMO | Admitting: Podiatry

## 2021-11-23 ENCOUNTER — Encounter: Payer: Self-pay | Admitting: Podiatry

## 2021-11-23 DIAGNOSIS — B351 Tinea unguium: Secondary | ICD-10-CM

## 2021-11-23 DIAGNOSIS — D485 Neoplasm of uncertain behavior of skin: Secondary | ICD-10-CM | POA: Diagnosis not present

## 2021-11-23 DIAGNOSIS — M79674 Pain in right toe(s): Secondary | ICD-10-CM

## 2021-11-23 DIAGNOSIS — E1142 Type 2 diabetes mellitus with diabetic polyneuropathy: Secondary | ICD-10-CM

## 2021-11-23 DIAGNOSIS — L578 Other skin changes due to chronic exposure to nonionizing radiation: Secondary | ICD-10-CM | POA: Diagnosis not present

## 2021-11-23 DIAGNOSIS — M79675 Pain in left toe(s): Secondary | ICD-10-CM | POA: Diagnosis not present

## 2021-11-23 DIAGNOSIS — L821 Other seborrheic keratosis: Secondary | ICD-10-CM | POA: Diagnosis not present

## 2021-11-23 DIAGNOSIS — Z85820 Personal history of malignant melanoma of skin: Secondary | ICD-10-CM | POA: Diagnosis not present

## 2021-11-23 DIAGNOSIS — L570 Actinic keratosis: Secondary | ICD-10-CM | POA: Diagnosis not present

## 2021-11-29 NOTE — Progress Notes (Signed)
Subjective: Mark Stewart is a 76 y.o. male patient seen today for follow up of  at risk foot care with history of diabetic neuropathy and painful elongated mycotic toenails 1-5 bilaterally which are tender when wearing enclosed shoe gear. Pain is relieved with periodic professional debridement..   Patient states their blood glucose was 292 mg/dl this morning.   New problem(s)/concern(s) today: None    PCP is Mark Anes, MD. Last visit was: 05/03/2021.  Allergies  Allergen Reactions   Benazepril Cough   Metformin And Related Rash   Penicillins Rash    Has patient had a PCN reaction causing immediate rash, facial/tongue/throat swelling, SOB or lightheadedness with hypotension: yes Has patient had a PCN reaction causing severe rash involving mucus membranes or skin necrosis: no Has patient had a PCN reaction that required hospitalization: no Has patient had a PCN reaction occurring within the last 10 years: no If all of the above answers are "NO", then may proceed with Cephalosporin use.     Objective: Physical Exam  General: Patient is a pleasant 76 y.o. Caucasian male in NAD. AAO x 3.   Neurovascular Examination: Capillary refill time to digits immediate b/l. Palpable pedal pulses b/l LE. Pedal hair absent. No pain with calf compression b/l. Lower extremity skin temperature gradient within normal limits. Trace edema noted BLE. No cyanosis or clubbing noted b/l LE.  Protective sensation diminished with 10g monofilament b/l.  Dermatological:  Pedal integument with normal turgor, texture and tone b/l LE. No open wounds b/l. No interdigital macerations b/l. Toenails 1-5 b/l elongated, thickened, discolored with subungual debris. +Tenderness with dorsal palpation of nailplates. No hyperkeratotic or porokeratotic lesions present.  Musculoskeletal:  Muscle strength 5/5 to all lower extremity muscle groups bilaterally. Hammertoe deformity noted 2-5 b/l. Pes planus deformity  noted bilateral LE.  Assessment: 1. Pain due to onychomycosis of toenails of both feet   2. Diabetic polyneuropathy associated with type 2 diabetes mellitus ([place])    Plan: Patient was evaluated and treated and all questions answered. Consent given for treatment as described below: -Continue foot and shoe inspections daily. Monitor blood glucose per PCP/Endocrinologist's recommendations. -Toenails 1-5 b/l were debrided in length and girth with sterile nail nippers and dremel without iatrogenic bleeding.  -Patient/POA to call should there be question/concern in the interim.  Return in about 3 months (around 02/20/2022).  Marzetta Board, DPM

## 2021-12-07 DIAGNOSIS — Z9641 Presence of insulin pump (external) (internal): Secondary | ICD-10-CM | POA: Diagnosis not present

## 2021-12-07 DIAGNOSIS — Z794 Long term (current) use of insulin: Secondary | ICD-10-CM | POA: Diagnosis not present

## 2021-12-07 DIAGNOSIS — Z7984 Long term (current) use of oral hypoglycemic drugs: Secondary | ICD-10-CM | POA: Diagnosis not present

## 2021-12-07 DIAGNOSIS — Z4681 Encounter for fitting and adjustment of insulin pump: Secondary | ICD-10-CM | POA: Diagnosis not present

## 2021-12-07 DIAGNOSIS — E1165 Type 2 diabetes mellitus with hyperglycemia: Secondary | ICD-10-CM | POA: Diagnosis not present

## 2021-12-07 DIAGNOSIS — I10 Essential (primary) hypertension: Secondary | ICD-10-CM | POA: Diagnosis not present

## 2021-12-07 DIAGNOSIS — E7849 Other hyperlipidemia: Secondary | ICD-10-CM | POA: Diagnosis not present

## 2021-12-07 DIAGNOSIS — E1142 Type 2 diabetes mellitus with diabetic polyneuropathy: Secondary | ICD-10-CM | POA: Diagnosis not present

## 2021-12-15 DIAGNOSIS — E118 Type 2 diabetes mellitus with unspecified complications: Secondary | ICD-10-CM | POA: Diagnosis not present

## 2021-12-25 ENCOUNTER — Other Ambulatory Visit: Payer: Self-pay

## 2021-12-25 MED ORDER — FLUTICASONE PROPIONATE 50 MCG/ACT NA SUSP: 2.0000 | Freq: Every day | NASAL | 2 refills | Status: DC | PRN

## 2022-01-03 ENCOUNTER — Encounter: Payer: Self-pay | Admitting: Legal Medicine

## 2022-01-03 ENCOUNTER — Ambulatory Visit (INDEPENDENT_AMBULATORY_CARE_PROVIDER_SITE_OTHER): Payer: HMO | Admitting: Legal Medicine

## 2022-01-03 VITALS — BP 100/58 | HR 68 | Temp 98.8°F | Resp 15 | Ht 72.0 in | Wt 215.0 lb

## 2022-01-03 DIAGNOSIS — J180 Bronchopneumonia, unspecified organism: Secondary | ICD-10-CM

## 2022-01-03 DIAGNOSIS — R42 Dizziness and giddiness: Secondary | ICD-10-CM

## 2022-01-03 MED ORDER — LEVOFLOXACIN 500 MG PO TABS: 500.0000 mg | ORAL_TABLET | Freq: Every day | ORAL | 0 refills | Status: AC

## 2022-01-03 MED ORDER — MECLIZINE HCL 25 MG PO TABS: 25.0000 mg | ORAL_TABLET | Freq: Three times a day (TID) | ORAL | 0 refills | Status: DC | PRN

## 2022-01-03 NOTE — Progress Notes (Signed)
? ?Acute Office Visit ? ?Subjective:  ? ? Patient ID: Mark Stewart, male    DOB: June 26, 1946, 76 y.o.   MRN: [id number] ? ?Chief Complaint  ?Patient presents with  ? Dizziness  ? Sinusitis  ? Sore Throat  ? ? ?HPI: ?Patient is in today for dizziness since few weeks ago speciallly in the morning. Last week, he has sinus problems and sore throat, body aches. Patient denied fever; chills. His Cpap tubing is discolored. Sick for one week. Neurology manages  CPAP. All his care is done by specialists at [place], I am not managing his care as a primary physician. ?Coughing, non productive.  Sore throat. ? ?Past Medical History:  ?Diagnosis Date  ? Allergy   ? Cancer [facility])   ? skin, hand  ? Cataract   ? history of surgery bilaterally  ? Hypercholesteremia   ? Hypertension   ? ICAO (internal carotid artery occlusion)   ? Spleen absent   ? patient states had spleen removed in high school  ? Stroke [facility]) 02/2016  ? ? ?Past Surgical History:  ?Procedure Laterality Date  ? EYE SURGERY    ? HERNIA REPAIR  02/28/2018  ? HERNIA REPAIR Bilateral 11/2019  ? PROSTATE SURGERY    ? SPLENECTOMY    ? ? ?Family History  ?Problem Relation Age of Onset  ? Breast cancer Mother   ? Diabetes Mellitus II Father   ? Breast cancer Sister   ? ? ?Social History  ? ?Socioeconomic History  ? Marital status: Married  ?  Spouse name: graye  ? Number of children: Not on file  ? Years of education: Not on file  ? Highest education level: Not on file  ?Occupational History  ? Not on file  ?Tobacco Use  ? Smoking status: Former  ?  Packs/day: 1.00  ?  Years: 20.00  ?  Pack years: 20.00  ?  Types: Cigarettes  ? Smokeless tobacco: Never  ? Tobacco comments:  ?  patient states last cigarette 35 years ago  ?Substance and Sexual Activity  ? Alcohol use: No  ? Drug use: No  ? Sexual activity: Not Currently  ?Other Topics Concern  ? Not on file  ?Social History Narrative  ? Lives with wife  ? Right Handed  ? Drinks 4-5 cups caffeine daily  ? ?Social Determinants  of Health  ? ?Financial Resource Strain: Not on file  ?Food Insecurity: Not on file  ?Transportation Needs: Not on file  ?Physical Activity: Not on file  ?Stress: Not on file  ?Social Connections: Not on file  ?Intimate Partner Violence: Not on file  ? ? ?Outpatient Medications Prior to Visit  ?Medication Sig Dispense Refill  ? amLODipine (NORVASC) 10 MG tablet Take 1 tablet (10 mg total) by mouth daily. 90 tablet 2  ? aspirin EC 81 MG tablet Take 81 mg by mouth daily.    ? atorvastatin (LIPITOR) 80 MG tablet TAKE 1 TABLET BY MOUTH EVERY DAY 90 tablet 2  ? buPROPion (WELLBUTRIN XL) 150 MG 24 hr tablet Take 150 mg by mouth daily.    ? carvedilol (COREG) 25 MG tablet TAKE 2 TABLETS BY MOUTH 2 TIMES DAILY. CALL FOR APPOINTMENT 360 tablet 2  ? cloNIDine (CATAPRES - DOSED IN MG/24 HR) 0.2 mg/24hr patch PLACE 1 PATCH (0.2 MG TOTAL) ONTO THE SKIN ONCE A WEEK. 12 patch 2  ? empagliflozin (JARDIANCE) 25 MG TABS tablet Take 25 mg by mouth daily.     ? fluticasone (  FLONASE) 50 MCG/ACT nasal spray Place 2 sprays into both nostrils daily as needed for allergies or rhinitis. Reported on 04/26/2016 18.2 mL 2  ? hydrochlorothiazide (HYDRODIURIL) 25 MG tablet Take 1 tablet (25 mg total) by mouth daily. 90 tablet 2  ? Insulin Disposable Pump (OMNIPOD DASH 5 PACK PODS) MISC     ? minoxidil (LONITEN) 10 MG tablet TAKE 1/2 TABLET BY MOUTH EVERY DAY 45 tablet 2  ? minoxidil (LONITEN) 10 MG tablet Take by mouth.    ? NOVOLOG 100 UNIT/ML injection     ? omeprazole (PRILOSEC) 40 MG capsule TAKE 1 CAPSULE BY MOUTH EVERY DAY 90 capsule 2  ? omeprazole (PRILOSEC) 40 MG capsule Take 1 capsule by mouth daily.    ? spironolactone (ALDACTONE) 50 MG tablet TAKE 1 TABLET BY MOUTH EVERY DAY 90 tablet 3  ? valsartan (DIOVAN) 320 MG tablet TAKE 1 TABLET BY MOUTH EVERY DAY 90 tablet 2  ? ?No facility-administered medications prior to visit.  ? ? ?Allergies  ?Allergen Reactions  ? Benazepril Cough  ? Metformin And Related Rash  ? Penicillins Rash  ?  Has  patient had a PCN reaction causing immediate rash, facial/tongue/throat swelling, SOB or lightheadedness with hypotension: yes ?Has patient had a PCN reaction causing severe rash involving mucus membranes or skin necrosis: no ?Has patient had a PCN reaction that required hospitalization: no ?Has patient had a PCN reaction occurring within the last 10 years: no ?If all of the above answers are "NO", then may proceed with Cephalosporin use. ?  ? ? ?Review of Systems  ?Constitutional:  Negative for chills, fatigue, fever and unexpected weight change.  ?HENT:  Positive for congestion, sinus pain and sore throat. Negative for ear pain.   ?Respiratory:  Positive for cough. Negative for shortness of breath.   ?Cardiovascular:  Negative for chest pain and palpitations.  ?Gastrointestinal:  Negative for abdominal pain, blood in stool, constipation, diarrhea, nausea and vomiting.  ?Endocrine: Negative for polydipsia.  ?Genitourinary:  Negative for dysuria and flank pain.  ?Musculoskeletal:  Positive for arthralgias and myalgias. Negative for back pain.  ?Skin:  Negative for rash.  ?Neurological:  Positive for dizziness. Negative for headaches.  ? ?   ?Objective:  ?  ?Physical Exam ?Vitals reviewed.  ?Constitutional:   ?   General: He is not in acute distress. ?   Appearance: Normal appearance. He is well-developed. He is ill-appearing.  ?HENT:  ?   Head: Normocephalic.  ?   Right Ear: Tympanic membrane normal.  ?   Left Ear: Tympanic membrane normal.  ?   Nose: Congestion present.  ?   Mouth/Throat:  ?   Mouth: Mucous membranes are moist.  ?   Pharynx: Oropharynx is clear.  ?Eyes:  ?   Extraocular Movements: Extraocular movements intact.  ?   Conjunctiva/sclera: Conjunctivae normal.  ?   Pupils: Pupils are equal, round, and reactive to light.  ?Cardiovascular:  ?   Rate and Rhythm: Normal rate and regular rhythm.  ?   Pulses: Normal pulses.  ?   Heart sounds: Normal heart sounds. No murmur heard. ?  No gallop.  ?Pulmonary:  ?    Effort: Pulmonary effort is normal. No respiratory distress.  ?   Breath sounds: Rhonchi present.  ?Musculoskeletal:  ?   Cervical back: Normal range of motion.  ?Lymphadenopathy:  ?   Cervical: No cervical adenopathy.  ?Neurological:  ?   General: No focal deficit present.  ?   Mental Status:  He is alert. Mental status is at baseline.  ? ? ?BP (!) 100/58   Pulse 68   Temp 98.8 ?F (37.1 ?C)   Resp 15   Ht 6' (1.829 m)   Wt 215 lb (97.5 kg)   SpO2 95%   BMI 29.16 kg/m?  ?Wt Readings from Last 3 Encounters:  ?01/03/22 215 lb (97.5 kg)  ?08/30/21 222 lb (100.7 kg)  ?07/03/21 214 lb (97.1 kg)  ? ? ?Health Maintenance Due  ?Topic Date Due  ? OPHTHALMOLOGY EXAM  Never done  ? Hepatitis C Screening  Never done  ? TETANUS/TDAP  Never done  ? Zoster Vaccines- Shingrix (1 of 2) Never done  ? COVID-19 Vaccine (4 - Booster for Moderna series) 11/18/2020  ? HEMOGLOBIN A1C  09/20/2021  ? ? ?There are no preventive care reminders to display for this patient. ? ? ?No results found for: TSH ?Lab Results  ?Component Value Date  ? WBC 6.1 03/21/2021  ? HGB 15.0 03/21/2021  ? HCT 44.5 03/21/2021  ? MCV 92 03/21/2021  ? PLT 260 03/21/2021  ? ?Lab Results  ?Component Value Date  ? NA 139 03/21/2021  ? K 4.0 03/21/2021  ? CO2 22 03/21/2021  ? GLUCOSE 121 (H) 03/21/2021  ? BUN 24 03/21/2021  ? CREATININE 1.20 03/21/2021  ? BILITOT 0.6 03/21/2021  ? ALKPHOS 89 03/21/2021  ? AST 13 03/21/2021  ? ALT 15 03/21/2021  ? PROT 6.9 03/21/2021  ? ALBUMIN 3.9 03/21/2021  ? CALCIUM 9.3 03/21/2021  ? ANIONGAP 8 06/16/2020  ? EGFR 63 03/21/2021  ? ?Lab Results  ?Component Value Date  ? CHOL 137 03/21/2021  ? ?Lab Results  ?Component Value Date  ? HDL 32 (L) 03/21/2021  ? ?Lab Results  ?Component Value Date  ? [place] 83 03/21/2021  ? ?Lab Results  ?Component Value Date  ? TRIG 124 03/21/2021  ? ?Lab Results  ?Component Value Date  ? CHOLHDL 4.3 03/21/2021  ? ?Lab Results  ?Component Value Date  ? HGBA1C 8.6 (H) 03/21/2021  ? ? ?   ?Assessment &  Plan:  ? ?Problem List Items Addressed This Visit   ?None ?Visit Diagnoses   ? ? Bronchopneumonia    -  Primary  ? Relevant Medications  ? levofloxacin (LEVAQUIN) 500 MG tablet ?Patient has chronic congesti

## 2022-01-16 DIAGNOSIS — E118 Type 2 diabetes mellitus with unspecified complications: Secondary | ICD-10-CM | POA: Diagnosis not present

## 2022-01-17 ENCOUNTER — Encounter: Payer: Self-pay | Admitting: Neurology

## 2022-01-29 ENCOUNTER — Telehealth: Payer: Self-pay

## 2022-01-29 NOTE — Telephone Encounter (Signed)
I called pt and left a vm asking for a call back to assist with scheduling f/u for him. ?Pt was advised for normal business hours and that telephone room staff can assist with scheduling this appt for him. ?

## 2022-01-29 NOTE — Telephone Encounter (Signed)
Pt has called the sleep lab today. Complaining of coughing spells and dizziness. Pt is worried that this is a side effect of using his CPAP machine. Tried to answer patients concerns but he is insistent that he have an appt to see Janett Billow NP to discuss his concerns. Also, remind patient to bring in PAP machine to retrieve download. Someone will need to reach out to patient to confirm this appt. Thanks! ?

## 2022-01-30 NOTE — Telephone Encounter (Signed)
Pt called wanting to schedule earlier appt.  ?Scheduled pt for 03/21/2022.  ?

## 2022-01-30 NOTE — Telephone Encounter (Signed)
Noted, thank you

## 2022-02-04 ENCOUNTER — Other Ambulatory Visit: Payer: Self-pay | Admitting: Cardiovascular Disease

## 2022-02-05 NOTE — Telephone Encounter (Signed)
Yes. ?Please add a reason when you are asking about a refill on a chronic medication on an active patient. It will help me understand if there truly is a problem. Why is there an issue with this particular refill? ?

## 2022-02-15 DIAGNOSIS — E118 Type 2 diabetes mellitus with unspecified complications: Secondary | ICD-10-CM | POA: Diagnosis not present

## 2022-02-21 ENCOUNTER — Other Ambulatory Visit: Payer: Self-pay | Admitting: Legal Medicine

## 2022-02-21 DIAGNOSIS — J449 Chronic obstructive pulmonary disease, unspecified: Secondary | ICD-10-CM

## 2022-02-21 NOTE — Telephone Encounter (Signed)
Refill sent to pharmacy.   

## 2022-02-22 ENCOUNTER — Ambulatory Visit: Payer: HMO | Admitting: Podiatry

## 2022-02-22 ENCOUNTER — Encounter: Payer: Self-pay | Admitting: Podiatry

## 2022-02-22 DIAGNOSIS — M79674 Pain in right toe(s): Secondary | ICD-10-CM | POA: Diagnosis not present

## 2022-02-22 DIAGNOSIS — B351 Tinea unguium: Secondary | ICD-10-CM | POA: Diagnosis not present

## 2022-02-22 DIAGNOSIS — M79675 Pain in left toe(s): Secondary | ICD-10-CM

## 2022-02-22 DIAGNOSIS — E1142 Type 2 diabetes mellitus with diabetic polyneuropathy: Secondary | ICD-10-CM | POA: Diagnosis not present

## 2022-03-02 NOTE — Progress Notes (Signed)
  Subjective:  Patient ID: Mark Stewart, male    DOB: Jan 22, 1946,  MRN: [id number]  Mark Stewart presents to clinic today for at risk foot care with history of diabetic neuropathy and painful thick toenails that are difficult to trim. Pain interferes with ambulation. Aggravating factors include wearing enclosed shoe gear. Pain is relieved with periodic professional debridement.  Last known HgA1c was 8.4%.  New problem(s): None.   PCP is Lillard Anes, MD , and last visit was January 03, 2022.  Allergies  Allergen Reactions   Benazepril Cough   Metformin And Related Rash   Penicillins Rash    Has patient had a PCN reaction causing immediate rash, facial/tongue/throat swelling, SOB or lightheadedness with hypotension: yes Has patient had a PCN reaction causing severe rash involving mucus membranes or skin necrosis: no Has patient had a PCN reaction that required hospitalization: no Has patient had a PCN reaction occurring within the last 10 years: no If all of the above answers are "NO", then may proceed with Cephalosporin use.     Review of Systems: Negative except as noted in the HPI.  Objective:   General: Patient is a pleasant 76 y.o. Caucasian male in NAD. AAO x 3.   Neurovascular Examination: Capillary refill time to digits immediate b/l. Palpable pedal pulses b/l LE. Pedal hair absent. No pain with calf compression b/l. Lower extremity skin temperature gradient within normal limits. Trace edema noted BLE. No cyanosis or clubbing noted b/l LE.  Protective sensation diminished with 10g monofilament b/l.  Dermatological:  Pedal integument with normal turgor, texture and tone b/l LE. No open wounds b/l. No interdigital macerations b/l. Toenails 1-5 b/l elongated, thickened, discolored with subungual debris. +Tenderness with dorsal palpation of nailplates. No hyperkeratotic or porokeratotic lesions present. Double nail plates b/l 2nd digits with old nails distal 2/3  and new nails growing out proximal 1/3. No erythema, no edema, no drainage. Old nail still adhered to new nail.  Musculoskeletal:  Muscle strength 5/5 to all lower extremity muscle groups bilaterally. Hammertoe deformity noted 2-5 b/l. Pes planus deformity noted bilateral LE.     Latest Ref Rng & Units 03/21/2021   12:00 AM  Hemoglobin A1C  Hemoglobin-A1c 4.8 - 5.6 % 8.6     Assessment/Plan: 1. Pain due to onychomycosis of toenails of both feet   2. Diabetic polyneuropathy associated with type 2 diabetes mellitus ([place])      -Patient was evaluated and treated. All patient's and/or POA's questions/concerns answered on today's visit. -Examined patient. -Continue diabetic foot care principles: inspect feet daily, monitor glucose as recommended by PCP and/or Endocrinologist, and follow prescribed diet per PCP, Endocrinologist and/or dietician. -Patient to continue soft, supportive shoe gear daily. -Mycotic toenails 1-5 bilaterally were debrided in length and girth with sterile nail nippers and dremel without incident. -Patient/POA to call should there be question/concern in the interim.   Return in about 3 months (around 05/25/2022).  Marzetta Board, DPM

## 2022-03-07 ENCOUNTER — Ambulatory Visit: Payer: HMO

## 2022-03-07 DIAGNOSIS — M2141 Flat foot [pes planus] (acquired), right foot: Secondary | ICD-10-CM

## 2022-03-07 DIAGNOSIS — M2041 Other hammer toe(s) (acquired), right foot: Secondary | ICD-10-CM

## 2022-03-07 DIAGNOSIS — E1142 Type 2 diabetes mellitus with diabetic polyneuropathy: Secondary | ICD-10-CM

## 2022-03-07 NOTE — Progress Notes (Signed)
SITUATION Reason for Consult: Evaluation for Prefabricated Diabetic Shoes and Custom Diabetic Inserts. Patient / Caregiver Report: Patient would like well fitting shoes  OBJECTIVE DATA: Patient History / Diagnosis:    ICD-10-CM   1. Diabetic polyneuropathy associated with type 2 diabetes mellitus (HCC)  E11.42     2. Hammer toes of both feet  M20.41    M20.42     3. Pes planus of both feet  M21.41    M21.42       Physician Treating Diabetes:  Lillard Anes  Current or Previous Devices:   Historical user  In-Person Foot Examination: Ulcers & Callousing:   Historical callusing Deformities:    Pes planus, hammertoes Sensation:    compromised  Shoe Size:     43M  ORTHOTIC RECOMMENDATION Recommended Devices: - 1x pair prefabricated PDAC approved diabetic shoes; Patient Selected Orthofeet 672 Sprint Grey Size 43M - 3x pair custom-to-patient PDAC approved vacuum formed diabetic insoles.  GOALS OF SHOES AND INSOLES - Reduce shear and pressure - Reduce / Prevent callus formation - Reduce / Prevent ulceration - Protect the fragile healing compromised diabetic foot.  Patient would benefit from diabetic shoes and inserts as patient has diabetes mellitus and the patient has one or more of the following conditions: - History of partial or complete amputation of the foot - History of previous foot ulceration. - History of pre-ulcerative callus - Peripheral neuropathy with evidence of callus formation - Foot deformity - Poor circulation  ACTIONS PERFORMED Potential out of pocket cost was communicated to patient. Patient understood and consented to measurement and casting. Patient was casted for insoles via crush box and measured for shoes via brannock device. Procedure was explained and patient tolerated procedure well. All questions were answered and concerns addressed. CMN Sent to Treating Physician. Casts were shipped to central fabrication for HOLD until Certificate of  Medical Necessity or otherwise necessary authorization from insurance is obtained.  PLAN Shoes are to be ordered and casts released from hold once all appropriate paperwork is complete. Patient is to be contacted and scheduled for fitting once shoes and insoles have been fabricated and received.

## 2022-03-14 DIAGNOSIS — Z7984 Long term (current) use of oral hypoglycemic drugs: Secondary | ICD-10-CM | POA: Diagnosis not present

## 2022-03-14 DIAGNOSIS — Z794 Long term (current) use of insulin: Secondary | ICD-10-CM | POA: Diagnosis not present

## 2022-03-14 DIAGNOSIS — Z4681 Encounter for fitting and adjustment of insulin pump: Secondary | ICD-10-CM | POA: Diagnosis not present

## 2022-03-14 DIAGNOSIS — E7849 Other hyperlipidemia: Secondary | ICD-10-CM | POA: Diagnosis not present

## 2022-03-14 DIAGNOSIS — E1140 Type 2 diabetes mellitus with diabetic neuropathy, unspecified: Secondary | ICD-10-CM | POA: Diagnosis not present

## 2022-03-14 DIAGNOSIS — Z9641 Presence of insulin pump (external) (internal): Secondary | ICD-10-CM | POA: Diagnosis not present

## 2022-03-19 DIAGNOSIS — E118 Type 2 diabetes mellitus with unspecified complications: Secondary | ICD-10-CM | POA: Diagnosis not present

## 2022-03-20 NOTE — Telephone Encounter (Signed)
Contacted pt, LVM Per DPR, reminding him to bring his machine in with him to this appt on 03/21/22.

## 2022-03-21 ENCOUNTER — Encounter: Payer: Self-pay | Admitting: Adult Health

## 2022-03-21 ENCOUNTER — Ambulatory Visit: Payer: HMO | Admitting: Adult Health

## 2022-03-21 VITALS — BP 122/65 | HR 69 | Ht 72.0 in | Wt 214.0 lb

## 2022-03-21 DIAGNOSIS — Z789 Other specified health status: Secondary | ICD-10-CM

## 2022-03-21 DIAGNOSIS — G4730 Sleep apnea, unspecified: Secondary | ICD-10-CM

## 2022-03-21 NOTE — Progress Notes (Addendum)
[facility] [address] [place]. [place] [address] [phone]       OFFICE FOLLOW UP NOTE  [name] Date of Birth:  03-Apr-1946 Medical Record Number:  [id number]   Reason for Referral:  CPAP compliance visit     SUBJECTIVE:   CHIEF COMPLAINT:  Chief Complaint  Patient presents with   Obstructive Sleep Apnea    RM 3 with spouse Mark Stewart Pt is well, states he hadn't been using machine because he had a hard time sleeping with mask and got a infection      HPI:   Update 03/21/2022 JM: patient returns for CPAP follow up after prior visit 9 months ago.  He is accompanied by his wife.  CPAP compliance report from 2/24 -3/25 noted below.  He has not been using his machine consistently over the past couple of months.  He reports continued difficulty tolerating mask and does not feel that CPAP has been beneficial in regards to his fatigue. He also started having issues with dizziness back in March, seen by PCP 3/29 with complaints of dizziness upon awakening, nonproductive cough, sinus problems, sore throat and body aches. Per wife, does not clean CPAP regularly.  Treated with antibiotics. Still had mild cough but dizziness improved. Restarted CPAP but dizziness returned. Wife read online that CPAP could cause dizziness there he stopped his CPAP. He has not been having issues with dizziness since he stopped.  He questions other forms of apnea treatment. He is amenable to restarting CPAP but would like to explore different options if able. Reports continuous conversations with PCP regarding continued fatigue, he also is routinely followed by endocrinology for DM management.   Epworth Sleepiness Scale 22/24 Fatigue severity scale 40/63        Update 07/03/2021 JM: Mark Stewart is a 76 y.o. male with PMHx of HTN, HLD, DM, hx of stroke 2017, OSA, b/l ICA stenosis and intracranial atherosclerotic disease.  Reevaluated by Dr. Rexene Alberts on 07/25/2020 for repeat  sleep study previously followed by Dr. Rexene Alberts with noted CPAP noncompliance.  Sleep study 2019 total AHI 11.1/h.  Recent HST 08/2020 showed severe OSA with total AHI of 51.7/h and recommend initiation of AutoPap titration/trial.  Returns today, 07/03/2021, returns for 35-monthCPAP follow-up accompanied by his wife. continues to experience day time fatigue with napping easily. Some difficulty initiating sleep (due to use of CPAP mask) but once asleep, stays asleep al night. Wife does not occasional leaks around his mask. He recently received new mask but has not yet replaced prior mask.  Epworth Sleepiness Scale 22 (prior 20)            ROS:   14 system review of systems performed and negative with exception of those listed in HPI  PMH:  Past Medical History:  Diagnosis Date   Allergy    [place], [place]   Cataract    history of surgery bilaterally   Hypercholesteremia    Hypertension    ICAO (internal carotid artery occlusion)    Spleen absent    patient states had spleen removed in high school   [facility] 02/2016    PSH:  Past Surgical History:  Procedure Laterality Date   EYE SURGERY     HERNIA REPAIR  02/28/2018   HERNIA REPAIR Bilateral 11/2019   PROSTATE SURGERY     SPLENECTOMY      Social History:  Social History   Socioeconomic History   Marital status:  Married    Spouse name: Mark Stewart   Number of children: Not on file   Years of education: Not on file   Highest education level: Not on file  Occupational History   Not on file  Tobacco Use   Smoking status: Former    Packs/day: 1.00    Years: 20.00    Total pack years: 20.00    Types: Cigarettes   Smokeless tobacco: Never   Tobacco comments:    patient states last cigarette 35 years ago  Substance and Sexual Activity   Alcohol use: No   Drug use: No   Sexual activity: Not Currently  Other Topics Concern   Not on file  Social History Narrative   Lives with wife   Right Handed    Drinks 4-5 cups caffeine daily   Social Determinants of Health   Financial Resource Strain: Low Risk  (08/29/2020)   Overall Financial Resource Strain (CARDIA)    Difficulty of Paying Living Expenses: Not hard at all  Food Insecurity: No Food Insecurity (08/29/2020)   Hunger Vital Sign    Worried About Running Out of Food in the Last Year: Never true    Ran Out of Food in the Last Year: Never true  Transportation Needs: No Transportation Needs (08/29/2020)   PRAPARE - Hydrologist (Medical): No    Lack of Transportation (Non-Medical): No  Physical Activity: Not on file  Stress: No Stress Concern Present (08/29/2020)   [place]    Feeling of Stress : Not at all  Social Connections: Moderately Integrated (08/29/2020)   Social Connection and Isolation Panel [NHANES]    Frequency of Communication with Friends and Family: Twice a week    Frequency of Social Gatherings with Friends and Family: Twice a week    Attends Religious Services: More than 4 times per year    Active Member of [employer] or Organizations: No    Attends Archivist Meetings: Never    Marital Status: Married  Human resources officer Violence: Not At Risk (08/29/2020)   Humiliation, Afraid, Rape, and Kick questionnaire    Fear of Current or Ex-Partner: No    Emotionally Abused: No    Physically Abused: No    Sexually Abused: No    Family History:  Family History  Problem Relation Stewart of Onset   Breast cancer Mother    Diabetes Mellitus II Father    Breast cancer Sister     Medications:   Current Outpatient Medications on File Prior to Visit  Medication Sig Dispense Refill   amLODipine (NORVASC) 10 MG tablet TAKE 1 TABLET BY MOUTH EVERY DAY 90 tablet 2   aspirin EC 81 MG tablet Take 81 mg by mouth daily.     atorvastatin (LIPITOR) 80 MG tablet TAKE 1 TABLET BY MOUTH EVERY DAY 90 tablet 2   buPROPion (WELLBUTRIN  XL) 150 MG 24 hr tablet Take 150 mg by mouth daily.     carvedilol (COREG) 25 MG tablet TAKE 2 TABLETS BY MOUTH 2 TIMES DAILY. CALL FOR APPOINTMENT 360 tablet 2   cloNIDine (CATAPRES - DOSED IN MG/24 HR) 0.2 mg/24hr patch PLACE 1 PATCH (0.2 MG TOTAL) ONTO THE SKIN ONCE A WEEK. 12 patch 2   empagliflozin (JARDIANCE) 25 MG TABS tablet Take 25 mg by mouth daily.      fluticasone (FLONASE) 50 MCG/ACT nasal spray Place 2 sprays into both nostrils daily as needed for allergies  or rhinitis. Reported on 04/26/2016 18.2 mL 2   hydrochlorothiazide (HYDRODIURIL) 25 MG tablet Take 1 tablet (25 mg total) by mouth daily. 90 tablet 2   Insulin Disposable Pump (OMNIPOD DASH 5 PACK PODS) MISC      meclizine (ANTIVERT) 25 MG tablet Take 1 tablet (25 mg total) by mouth 3 (three) times daily as needed for dizziness. 30 tablet 0   minoxidil (LONITEN) 10 MG tablet TAKE 1/2 TABLET BY MOUTH EVERY DAY 45 tablet 2   NOVOLOG 100 UNIT/ML injection      omeprazole (PRILOSEC) 40 MG capsule TAKE 1 CAPSULE BY MOUTH EVERY DAY 90 capsule 2   spironolactone (ALDACTONE) 50 MG tablet TAKE 1 TABLET BY MOUTH EVERY DAY 90 tablet 3   valsartan (DIOVAN) 320 MG tablet TAKE 1 TABLET BY MOUTH EVERY DAY 90 tablet 2   No current facility-administered medications on file prior to visit.    Allergies:   Allergies  Allergen Reactions   Benazepril Cough   Metformin And Related Rash   Penicillins Rash    Has patient had a PCN reaction causing immediate rash, facial/tongue/throat swelling, SOB or lightheadedness with hypotension: yes Has patient had a PCN reaction causing severe rash involving mucus membranes or skin necrosis: no Has patient had a PCN reaction that required hospitalization: no Has patient had a PCN reaction occurring within the last 10 years: no If all of the above answers are "NO", then may proceed with Cephalosporin use.       OBJECTIVE:  Physical Exam  Vitals:   03/21/22 1445  BP: 122/65  Pulse: 69  Weight:  214 lb (97.1 kg)  Height: 6' (1.829 m)   Body mass index is 29.02 kg/m. No results found.   General: well developed, well nourished,  pleasant elderly Caucasian male, seated, in no evident distress Head: head normocephalic and atraumatic.   Neck: supple with no carotid or supraclavicular bruits Cardiovascular: regular rate and rhythm, no murmurs Musculoskeletal: no deformity Skin:  no rash/petichiae Vascular:  Normal pulses all extremities   Neurologic Exam Mental Status: Awake and fully alert.   Fluent speech and language.  Oriented to place and time. Recent and remote memory intact. Attention span, concentration and fund of knowledge appropriate. Mood and affect appropriate.  Cranial Nerves: Fundoscopic exam reveals sharp disc margins. Pupils equal, briskly reactive to light. Extraocular movements full without nystagmus. Visual fields full to confrontation. Hearing intact. Facial sensation intact. Face, tongue, palate moves normally and symmetrically.  Motor: Normal bulk and tone. Normal strength in all tested extremity muscles except slightly decreased left hand grip strength (chronic) Sensory.: intact to touch , pinprick , position and vibratory sensation.  Coordination: Rapid alternating movements normal in all extremities. Finger-to-nose and heel-to-shin performed accurately bilaterally. Gait and Station: Arises from chair without difficulty. Stance is normal. Gait demonstrates normal stride length without use of assistive device.   Reflexes: 1+ and symmetric. Toes downgoing.        ASSESSMENT/PLAN: SYMIR MAH is a 76 y.o. year old male PHMx of prior stroke, HTN, HLD, DM, carotid stenosis and severe sleep apnea.  Prior sleep study 01/2018 showed mild sleep apnea with total AHI 11.1/h but noncompliant with CPAP with difficulty tolerating.  Reestablished care with Dr. Rexene Alberts with HST 08/2020 showing severe obstructive sleep apnea with total AHI 51.7/h.       Severe OSA:   Continued difficulty tolerating CPAP - he has not used over the past couple of months due to infection and dizziness  back in March. Both have improved since stopping CPAP.  Will follow up with Dr. Rexene Alberts to see if he would be appropriate for any other type of treatment method Offered mask refitting - he prefers continued use of nasal pillow Discussed importance of daily CPAP cleaning in order to prevent infections and sinus issues He does have history of chronic dizziness - suspect dizziness in sitting of possible sinus infection not necessarily from CPAP He is willing to restart CPAP until he hears otherwise  Excessive daytime fatigue Suspect multifactorial with history of prior strokes, intermittent use of CPAP, underlying depression, sedentary, polypharmacy, multiple comorbidities and poor sleep hygiene Would encouraged to continue to follow with PCP and endocrinology for routine discussion regarding medications and need of continued use Increase daytime activity, ensure healthy diet and continue to follow with PCP for depression management     Follow-up will be determined after further discussion with Dr. Rexene Alberts   CC:  PCP: Mark Anes, MD    I spent 26 minutes of face-to-face and non-face-to-face time with patient and wife.  This included previsit chart review, lab review, study review, electronic health record documentation, patient and wife education regarding severe sleep apnea and importance of CPAP machine, review and discussion of compliance report and CPAP related concerns and continued daytime fatigue concerns and answered all other questions to patient and wife's satisfaction   Mark Stewart, AGNP-BC  [facility] [address] [place], [place] [address]  Phone [phone] Fax [phone] Note: This document was prepared with digital dictation and possible smart phrase technology. Any transcriptional errors that result from  this process are unintentional.   I reviewed the above note and documentation by the Nurse Practitioner and agree with the history, exam, assessment and plan as outlined above. I was available for consultation. He may or may not be a good candidate for inspire, I would be happy to talk to him about it.  In the interim, I recommend he continue with his CPAP and get new supplies and a new filter and talk to primary care about seeing ENT for his dizziness and sinus issues.  Mark Age, MD, PhD [facility])

## 2022-03-22 ENCOUNTER — Telehealth: Payer: Self-pay | Admitting: *Deleted

## 2022-03-22 NOTE — Telephone Encounter (Signed)
Called LMVM for pt to return call for appt with Dr. Rexene Alberts for sleep options.  See ENT (not pcp) for dizziness as per yesterday's visit.

## 2022-03-22 NOTE — Telephone Encounter (Signed)
-----   Message from [name], NP sent at 03/22/2022  1:34 PM EDT ----- Sounds good. Thank you!  POD 4 - can you please contact patient to schedule a f/u visit with Dr. Rexene Alberts to discuss further sleep apnea treatment options. We did discuss seeing ENT for dizziness at yesterdays visit, can you please reiterate this to patient and wife as this was also recommended by Dr. Rexene Alberts.  Thank you!!   ----- Message ----- From: Star Age, MD Sent: 03/22/2022   1:07 PM EDT To: [name], NP  Hi,  He may or may not be a good candidate for inspire, I would be happy to talk to him about it.  Please offer him a follow-up appointment with me to discuss inspire. In the interim I would recommend he continue with his CPAP and get new supplies and a new filter and talk to primary care about seeing ENT for his dizziness and sinus issues. sa  ----- Message ----- From: [name], NP Sent: 03/21/2022   3:50 PM EDT To: Star Age, MD  Dr. Rexene Alberts, patient continues to struggle with CPAP use. Recently, reported infection and dizziness with use of CPAP. Dizziness subsided after use - wife questions if CPAP is causing dizziness. Believe dizziness more in setting of sinus injection and inadequate cleaning of CPAP but wanted to double check with you. He also questions if he is a good candidate for inspire device. Thank you for your input!   jess

## 2022-03-26 DIAGNOSIS — L578 Other skin changes due to chronic exposure to nonionizing radiation: Secondary | ICD-10-CM | POA: Diagnosis not present

## 2022-03-26 DIAGNOSIS — L570 Actinic keratosis: Secondary | ICD-10-CM | POA: Diagnosis not present

## 2022-03-26 DIAGNOSIS — L728 Other follicular cysts of the skin and subcutaneous tissue: Secondary | ICD-10-CM | POA: Diagnosis not present

## 2022-03-26 DIAGNOSIS — L821 Other seborrheic keratosis: Secondary | ICD-10-CM | POA: Diagnosis not present

## 2022-03-26 DIAGNOSIS — Z85820 Personal history of malignant melanoma of skin: Secondary | ICD-10-CM | POA: Diagnosis not present

## 2022-03-26 NOTE — Telephone Encounter (Signed)
LMVM for pt to return call for appt with Dr. Rexene Alberts for sleep option (inspire), also per JMNP see ENT for dizziness.  Left # to call and set up appt.

## 2022-03-26 NOTE — Telephone Encounter (Signed)
I called and picked up to LM.  I did not leave another message.

## 2022-03-30 ENCOUNTER — Other Ambulatory Visit: Payer: Self-pay | Admitting: Cardiovascular Disease

## 2022-04-16 NOTE — Telephone Encounter (Signed)
Pt is asking for a call back from [place], [place]

## 2022-04-17 NOTE — Telephone Encounter (Signed)
I called and LMVM for pt to return call.   

## 2022-04-18 DIAGNOSIS — E118 Type 2 diabetes mellitus with unspecified complications: Secondary | ICD-10-CM | POA: Diagnosis not present

## 2022-04-18 NOTE — Telephone Encounter (Signed)
I called pt.  LMVM for him with the message per Dr. Rexene Alberts. "He may or may not be a good candidate for inspire, I would be happy to talk to him about it.  Please offer him a follow-up appointment with me to discuss inspire. In the interim I would recommend he continue with his CPAP and get new supplies and a new filter and talk to primary care about seeing ENT for his dizziness and sinus issues."  Pt is to call back for appt or is any questions.  I LMVM for him on home phone (ok per DPR).

## 2022-04-19 NOTE — Telephone Encounter (Signed)
Pt would like a call back from [place], [place]

## 2022-04-19 NOTE — Telephone Encounter (Signed)
LMVM for pt that returned call.  If calls back give me # and time to call to see if we can connect and not miss each other.

## 2022-04-20 ENCOUNTER — Other Ambulatory Visit: Payer: Self-pay | Admitting: Cardiovascular Disease

## 2022-04-22 ENCOUNTER — Other Ambulatory Visit: Payer: Self-pay | Admitting: Cardiovascular Disease

## 2022-04-23 NOTE — Telephone Encounter (Signed)
Pt would like for Katharine Look to call him back.

## 2022-04-23 NOTE — Telephone Encounter (Signed)
I called and spoke to pt.  Made appt for him to discuss inspire for 04-26-2022 at [place].  He verbalized understanding.

## 2022-04-26 ENCOUNTER — Ambulatory Visit (INDEPENDENT_AMBULATORY_CARE_PROVIDER_SITE_OTHER): Payer: PPO | Admitting: Neurology

## 2022-04-26 ENCOUNTER — Encounter: Payer: Self-pay | Admitting: Neurology

## 2022-04-26 VITALS — BP 118/66 | HR 74 | Ht 72.0 in | Wt 219.6 lb

## 2022-04-26 DIAGNOSIS — Z789 Other specified health status: Secondary | ICD-10-CM

## 2022-04-26 DIAGNOSIS — G4733 Obstructive sleep apnea (adult) (pediatric): Secondary | ICD-10-CM

## 2022-04-26 DIAGNOSIS — R42 Dizziness and giddiness: Secondary | ICD-10-CM | POA: Diagnosis not present

## 2022-04-26 DIAGNOSIS — Z8673 Personal history of transient ischemic attack (TIA), and cerebral infarction without residual deficits: Secondary | ICD-10-CM | POA: Diagnosis not present

## 2022-04-26 DIAGNOSIS — R400 Somnolence: Secondary | ICD-10-CM | POA: Diagnosis not present

## 2022-04-26 NOTE — Patient Instructions (Signed)
It was nice to see you both again today.  Unfortunately, I do not believe inspire would be a good treatment for you.  I do believe that CPAP therapy would be the better choice for you long-term.  As discussed, your home sleep test showed evidence of obstructive sleep apnea in the severe range.  I would like for you to come back for a second sleep study for CPAP titration meaning that we will use CPAP during that study and try to determine the right pressure for you with the right style of mask.  I have already ordered your sleep study and we will check with your insurance for authorization the sleep lab will call you to schedule your overnight test. On an ongoing basis, you will have to be very consistent with replacing all your CPAP supplies, including, mask, hose, filter and headgear and even the humidifier chamber.  We will see you back after your next sleep study.

## 2022-04-26 NOTE — Progress Notes (Signed)
Subjective:    Patient ID: Mark Stewart is a 76 y.o. male.  HPI    Interim history:  Mark Stewart is a 76 year old right-handed gentleman with an underlying medical history of hypertension, hyperlipidemia, status post splenectomy as a teenager, stroke in 2017, diabetes, cataracts, allergies, and overweight state, who presents for follow-up consultation of his obstructive sleep apnea with difficulty tolerating PAP therapy.  The patient is accompanied by his wife today.  I am on 07/25/2020, at which time we talked about secondary stroke prevention.  He was advised to follow-up with the stroke specialist on a regular basis.  He was advised to proceed with reevaluation of his obstructive sleep apnea.  He had an interim home sleep test on 08/17/2020 which indicated severe obstructive sleep apnea with an AHI of 51.7/h, O2 nadir 86%. He saw Mark Rider, NP on 03/21/2022, at which time he was not compliant with his AutoPap.  He reported having dizziness from it.  Today, 04/26/2022: He reports, that the mask is uncomfortable.  He had stopped using his AutoPap around late March or early April because of sinus infection and he admits that he had not been cleaning his machine properly and he had not been up-to-date with his supplies.  His wife finally cleaned the machine and he tried it again but overall does not sleep well, does not adhere to schedule, finds it very cumbersome to use and feels that he has not benefited from AutoPap therapy.  He is advised that initially when he started AutoPap therapy was very compliant and he also endorsed improvement in his daytime somnolence.  His wife reports that he is very sleepy during the day, Epworth sleepiness score is 21 out of 24.  He goes to bed late, as late as 2 or 3 AM, he watches TV and then falls asleep and often takes multiple naps during the day, also sleeps in the car when he is a passenger, drives very little but still drives some.  He was fairly consistent  with his compliance up until April 2023, and even in the past year, from April 2022 through April 2023, he had good compliance, average usage of 5-1/2 hours, average AHI less than 2/h.  We talked about inspire at length today.  Reports that he has forgetfulness and does not keep up with dates or times.  He also felt that he was getting dizziness from it but when he stopped using his machine he still had dizziness.  Previously (copied from previous notes for reference):   He saw Mark Stewart on 07/04/2021, at which time he was compliant with his AutoPap machine.  He saw Mark Rider, NP on 12/26/2020, at which time he was compliant with his AutoPap and reported improvement in his daytime somnolence.  He saw Mark Stewart on 09/06/2020, at which time medical management for right carotid artery stenosis was recommended.  07/25/20: (He) presents for Evaluation of his brain atherosclerotic disease and history of stroke.  The patient is accompanied by his wife today and is referred by Mark Stewart.  Reviewed his office note from 06/16/2020.  He has had intermittent dizziness.  He has not had any recent falls but did fall backwards in the garage when he turned.  He did not hurt himself thankfully.  This was a few months ago.  His wife reports that sometimes he is not accurate with his medication intake as far as timing goes but he reports that he takes all his medications on a regular basis  with the rare exception of forgetting medication.  He has not had any sudden onset of one-sided weakness or numbness or tingling or droopy face or slurring of speech recently.  He was supposed to see a vascular specialist but was told that he did not need any carotid artery surgery as I understand.    At the time of his appointment with Mark Stewart on 06/16/2020 his blood pressure was notably high, 197/105 and he was advised to go to the emergency room after the appointment.  His wife reports that they did go to the emergency  room and were there for several hours.  I reviewed the emergency room records from 06/16/2020.  His blood pressure in the ER was 159/123.  He was not given any new medications in the ER.  He was advised to follow-up with his primary care physician for blood pressure management.   Evaluation was requested by Mark Stewart with stroke specialist, Mark Stewart.  The patient was under the impression that he would meet with the stroke specialist today.  He has not been seen in stroke clinic since Mark Stewart, nurse practitioner retired.   I have followed him for sleep apnea.  He was previously followed by the stroke team, particularly Mark Stewart, nurse practitioner and Dr. Erlinda Stewart.    I last saw him on 08/10/2019 for reevaluation of his sleep apnea.  He reported feeling sleepy during the day.  He had not used his CPAP.   He has not used his machine for nearly 2 years.  He was willing to proceed with a sleep study but wanted to wait after the first of the year.      He had a CT angiogram on 05/26/2020 of the head and neck and was found to have abnormal appearance of the right ICA with severe stenosis of the right carotid siphon, 70% or greater.  Mild atherosclerotic disease of the left carotid siphon without significant stenosis.  Atherosclerotic disease at both carotid bifurcations but without stenosis, aortic atherosclerosis.  30% stenosis of the proximal left subclavian artery.  50% stenosis of the left vertebral artery origin.     The patient's allergies, current medications, family history, past medical history, past social history, past surgical history and problem list were reviewed and updated as appropriate.      I saw him on 05/29/2018, at which time he was compliant with CPAP but had trouble tolerating it.       I first met him at the request of Dr. Erlinda Stewart and Mark Rubin, NP, on 12/05/2017 at which time he reported snoring and daytime somnolence. His Epworth sleepiness score was 23 at the time. He  was advised to proceed with sleep study testing. He had a baseline sleep study, followed by a CPAP titration study. I went over his test results with him in detail today. His baseline sleep study from 01/15/2018 showed a sleep efficiency of 74.1%, sleep latency 92 minutes, REM latency 87.5 minutes. He had an increased percentage of stage II sleep, REM sleep was mildly reduced at 16.9%, total AHI was 11.1 per hour, REM AHI 9.2 per hour, supine sleep was not achieved. Average oxygen saturation was 94%, nadir was 86%. Based on his medical history and sleep related complaints he was advised to pursue CPAP therapy. He had a titration study on 02/26/2018. Sleep efficiency was 80.2%, sleep latency 11 minutes, REM latency 67.5 minutes. He was titrated via full facemask from CPAP of 5 cm to 15 cm. On the  final pressure his AHI was 0 per hour with supine REM sleep achieved an O2 nadir of 91%. He had mild PLMS with minimal arousals during that study. Based on his test results I prescribed CPAP therapy for home use at a pressure of 15 cm.   I reviewed his CPAP compliance data from 04/28/2018 through 05/27/2018 which is a total of 30 days, during which time he used his CPAP every night with percent used days greater than 4 hours at 70%, indicating adequate compliance with an average usage of 5 hours and 21 minutes, residual AHI at goal at 2.1 per hour, leak on the high side with the 95th percentile at 38.8 L/m on a pressure of 15 cm with EPR of 3.    12/05/2017: (He) reports snoring and excessive daytime somnolence. I reviewed your office note from 10/25/2017. His Epworth sleepiness score is 23 out of 24 today, fatigue score is 41 out of 63. He quit smoking, he does not utilize alcohol currently, drinks caffeine in the form of coffee, typically 1 cup per day and 3 cups of tea per day on average. He does not have that scheduled for his bedtime and rise time. Per wife, he tends to sit in the recliner in the dependent watch TV  but he dozes off frequently. He may not come to bed until 1 or 2 AM and sometimes as late as 3 AM, her patient his rise time is around 7 but per wife, he may be in bed until 10 unless she makes him get out of bed. He does not have a formal family history of sleep apnea but suspects that his father could've had it. He had a tonsillectomy as a child. He quit smoking over 30 years ago. He has 20 more is and 4 grandchildren, is retired as an Optometrist. His wife also recently retired. His weight has been stable. He has nocturia about once or twice per night, denies morning headaches. He denies telltale symptoms of restless leg syndrome. Sometimes he mumbles in his sleep and says a few words per wife's report.   His Past Medical History Is Significant For: Past Medical History:  Diagnosis Date   Allergy    Cancer ([place])    skin, hand   Cataract    history of surgery bilaterally   Hypercholesteremia    Hypertension    ICAO (internal carotid artery occlusion)    Spleen absent    patient states had spleen removed in high school   Stroke [facility]) 02/2016    His Past Surgical History Is Significant For: Past Surgical History:  Procedure Laterality Date   EYE SURGERY     HERNIA REPAIR  02/28/2018   HERNIA REPAIR Bilateral 11/2019   PROSTATE SURGERY     SPLENECTOMY      His Family History Is Significant For: Family History  Problem Relation Age of Onset   Breast cancer Mother    Diabetes Mellitus II Father    Breast cancer Sister    Sleep apnea Neg Hx     His Social History Is Significant For: Social History   Socioeconomic History   Marital status: Married    Spouse name: graye   Number of children: Not on file   Years of education: Not on file   Highest education level: Not on file  Occupational History   Not on file  Tobacco Use   Smoking status: Former    Packs/day: 1.00    Years: 20.00  Total pack years: 20.00    Types: Cigarettes   Smokeless tobacco: Never   Tobacco  comments:    patient states last cigarette 35 years ago  Substance and Sexual Activity   Alcohol use: No   Drug use: No   Sexual activity: Not Currently  Other Topics Concern   Not on file  Social History Narrative   Lives with wife   Right Handed   Drinks 4-5 cups caffeine daily   Social Determinants of Health   Financial Resource Strain: Low Risk  (08/29/2020)   Overall Financial Resource Strain (CARDIA)    Difficulty of Paying Living Expenses: Not hard at all  Food Insecurity: No Food Insecurity (08/29/2020)   Hunger Vital Sign    Worried About Running Out of Food in the Last Year: Never true    Ran Out of Food in the Last Year: Never true  Transportation Needs: No Transportation Needs (08/29/2020)   PRAPARE - Hydrologist (Medical): No    Lack of Transportation (Non-Medical): No  Physical Activity: Not on file  Stress: No Stress Concern Present (08/29/2020)   [place]    Feeling of Stress : Not at all  Social Connections: Moderately Integrated (08/29/2020)   Social Connection and Isolation Panel [NHANES]    Frequency of Communication with Friends and Family: Twice a week    Frequency of Social Gatherings with Friends and Family: Twice a week    Attends Religious Services: More than 4 times per year    Active Member of [employer] or Organizations: No    Attends Music therapist: Never    Marital Status: Married    His Allergies Are:  Allergies  Allergen Reactions   Benazepril Cough   Metformin And Related Rash   Penicillins Rash    Has patient had a PCN reaction causing immediate rash, facial/tongue/throat swelling, SOB or lightheadedness with hypotension: yes Has patient had a PCN reaction causing severe rash involving mucus membranes or skin necrosis: no Has patient had a PCN reaction that required hospitalization: no Has patient had a PCN reaction occurring  within the last 10 years: no If all of the above answers are "NO", then may proceed with Cephalosporin use.   :   His Current Medications Are:  Outpatient Encounter Medications as of 04/26/2022  Medication Sig   amLODipine (NORVASC) 10 MG tablet TAKE 1 TABLET BY MOUTH EVERY DAY   aspirin EC 81 MG tablet Take 81 mg by mouth daily.   atorvastatin (LIPITOR) 80 MG tablet TAKE 1 TABLET BY MOUTH EVERY DAY   buPROPion (WELLBUTRIN XL) 150 MG 24 hr tablet Take 150 mg by mouth daily.   carvedilol (COREG) 25 MG tablet TAKE 2 TABLETS BY MOUTH 2 TIMES DAILY. CALL FOR APPOINTMENT   Cholecalciferol (VITAMIN D3 PO) Take by mouth daily.   cloNIDine (CATAPRES - DOSED IN MG/24 HR) 0.2 mg/24hr patch UNWRAP AND APPLY 1 PATCH TO SKIN WEEKLY   empagliflozin (JARDIANCE) 25 MG TABS tablet Take 25 mg by mouth daily.    fluticasone (FLONASE) 50 MCG/ACT nasal spray Place 2 sprays into both nostrils daily as needed for allergies or rhinitis. Reported on 04/26/2016   hydrochlorothiazide (HYDRODIURIL) 25 MG tablet Take 1 tablet (25 mg total) by mouth daily.   Insulin Disposable Pump (OMNIPOD DASH 5 PACK PODS) MISC    meclizine (ANTIVERT) 25 MG tablet Take 1 tablet (25 mg total) by mouth 3 (three)  times daily as needed for dizziness.   minoxidil (LONITEN) 10 MG tablet TAKE 1/2 TABLET BY MOUTH EVERY DAY   NOVOLOG 100 UNIT/ML injection    omeprazole (PRILOSEC) 40 MG capsule TAKE 1 CAPSULE BY MOUTH EVERY DAY   spironolactone (ALDACTONE) 50 MG tablet TAKE 1 TABLET BY MOUTH EVERY DAY   valsartan (DIOVAN) 320 MG tablet TAKE 1 TABLET BY MOUTH EVERY DAY   No facility-administered encounter medications on file as of 04/26/2022.  :  Review of Systems:  Out of a complete 14 point review of systems, all are reviewed and negative with the exception of these symptoms as listed below:  Review of Systems  Neurological:        Pt in 5 pt  Pt did trip over chairs in exam room . Pt didn't fall Pt states his Blood sugar was 313 this am      Pt states that he has not worn CPAP since may 2023 and he wants to discuss Inspire . Pt states unable to wear mask    ESS:21       Objective:  Neurological Exam  Physical Exam Physical Examination:   Vitals:   04/26/22 0902  BP: 118/66  Pulse: 74    General Examination: The patient is a very pleasant 76 y.o. male in no acute distress. He appears well-developed and well-nourished and well groomed.   HEENT: Normocephalic, atraumatic, pupils are equal, round and reactive to light and accommodation. He wears corrective eyeglasses. Face is symmetric, no dysarthria, no facial weakness. No nuchal rigidity. Airway examination reveals adequate dental hygiene, moderate airway crowding, tongue is normal and protrudes centrally, palate elevates symmetrically.    Chest: Clear to auscultation without wheezing, rhonchi or crackles noted.   Heart: S1+S2+0, regular and normal without murmurs, rubs or gallops noted.    Abdomen: Soft, non-tender and non-distended.   Extremities: There is trace pitting edema.    Skin: Warm and dry without trophic changes noted.   Musculoskeletal: exam reveals no obvious joint deformities.    Neurologically:  Mental status: The patient is awake, alert and oriented in all 4 spheres. His immediate and remote memory, attention, language skills and fund of knowledge are fairly appropriate.  Details of his history are provided by his wife.  Thought process is linear. Mood is normal and affect is normal.  Cranial nerves II - XII are as described above under HEENT exam.  Motor exam: Normal bulk, strength and tone is noted. There is no obvious tremor Fine motor skills and coordination: grossly intact.  Cerebellar testing: No dysmetria or intention tremor. There is no truncal or gait ataxia.  Normal finger-to-nose bilaterally. Sensory exam: intact to light touch in the upper and lower extremities.  Gait, station and balance: He stands easily. No veering to one side is  noted. No leaning to one side is noted. Posture is age-appropriate and stance is narrow based. Gait shows normal stride length and normal pace. No problems turning are noted.    Assessment and Plan:  In summary, Mark Stewart is a 76 year old male with an underlying medical history of hypertension, hyperlipidemia, status post splenectomy as a teenager, stroke in 2017, diabetes, cataracts, allergies, and overweight state, who presents for follow-up consultation of his obstructive sleep apnea.  He has been compliant with AutoPap therapy for the past 1+ year but stopped using his machine in April 2023.  He reportedly had a significant sinus infection, needed to be treated with antibiotics, he was not always up-to-date  with his supply changes and cleaning of the machine.  He has multiple stroke risk factors and we talked about the importance of treating severe obstructive sleep apnea.  His home sleep test from 08/17/2020 indicated severe obstructive sleep apnea with an AHI of 51.7/h, O2 nadir 86%. His apnea scores are good when he is on treatment.  We talked about inspire at length today.  I am not sure that inspire is a good treatment option for him.  I explained the inspire process in detail to the patient and his wife, from ENT consultation to drug-induced sleep endoscopy to the actual surgery in the healing process and the activation and stimulation which can take months altogether.  Unfortunately, currently, he is not treated, he is advised to not give up on PAP therapy quite yet.  Unfortunately, he has had some forgetfulness.  We talked about surgical risk and anesthesia.  He would need to be able to self adjust the treatment at times on a weekly basis.  We mutually agreed to give PAP therapy another chance.  To that end, I recommend that he come in for a titration study so we can optimize his mask fit and his treatment settings, possibly put him on CPAP rather than AutoPap therapy.  He is not tolerating his  current fullface mask well.  We will plan a follow-up after his titration study.  He is agreeable to pursuing a formal titration at this time. I answered all their questions today and the patient and his wife were in agreement with the plan. I spent 45 minutes in total face-to-face time and in reviewing records during pre-charting, more than 50% of which was spent in counseling and coordination of care, reviewing test results, reviewing medications and treatment regimen and/or in discussing or reviewing the diagnosis of OSA, the prognosis and treatment options. Pertinent laboratory and imaging test results that were available during this visit with the patient were reviewed by me and considered in my medical decision making (see chart for details).

## 2022-05-01 ENCOUNTER — Telehealth: Payer: Self-pay | Admitting: Neurology

## 2022-05-01 NOTE — Telephone Encounter (Signed)
HTA pending faxed notes 

## 2022-05-03 ENCOUNTER — Other Ambulatory Visit: Payer: Self-pay | Admitting: Cardiovascular Disease

## 2022-05-03 ENCOUNTER — Other Ambulatory Visit: Payer: Self-pay | Admitting: Legal Medicine

## 2022-05-03 DIAGNOSIS — M1712 Unilateral primary osteoarthritis, left knee: Secondary | ICD-10-CM | POA: Diagnosis not present

## 2022-05-07 NOTE — Telephone Encounter (Signed)
CPAP titration- HTA auth: [id number] (exp. 05/01/22 to 08/01/22)  Patient is scheduled at [facility] for 05/28/22 at 9 pm.  Mailed packet to the patient.

## 2022-05-15 ENCOUNTER — Other Ambulatory Visit: Payer: Self-pay | Admitting: Legal Medicine

## 2022-05-15 ENCOUNTER — Other Ambulatory Visit: Payer: Self-pay | Admitting: Cardiovascular Disease

## 2022-05-15 DIAGNOSIS — J449 Chronic obstructive pulmonary disease, unspecified: Secondary | ICD-10-CM

## 2022-05-18 DIAGNOSIS — E118 Type 2 diabetes mellitus with unspecified complications: Secondary | ICD-10-CM | POA: Diagnosis not present

## 2022-05-18 MED ORDER — CLONIDINE 0.2 MG/24HR TD PTWK: MEDICATED_PATCH | TRANSDERMAL | 1 refills | Status: DC

## 2022-05-18 NOTE — Addendum Note (Signed)
Addended by: Lubertha Sayres on: 05/18/2022 04:13 PM   Modules accepted: Orders

## 2022-05-23 ENCOUNTER — Other Ambulatory Visit: Payer: Self-pay | Admitting: Legal Medicine

## 2022-05-28 ENCOUNTER — Ambulatory Visit (INDEPENDENT_AMBULATORY_CARE_PROVIDER_SITE_OTHER): Payer: HMO | Admitting: Neurology

## 2022-05-28 DIAGNOSIS — R400 Somnolence: Secondary | ICD-10-CM

## 2022-05-28 DIAGNOSIS — Z789 Other specified health status: Secondary | ICD-10-CM

## 2022-05-28 DIAGNOSIS — R42 Dizziness and giddiness: Secondary | ICD-10-CM

## 2022-05-28 DIAGNOSIS — G4720 Circadian rhythm sleep disorder, unspecified type: Secondary | ICD-10-CM

## 2022-05-28 DIAGNOSIS — G4733 Obstructive sleep apnea (adult) (pediatric): Secondary | ICD-10-CM | POA: Diagnosis not present

## 2022-05-28 DIAGNOSIS — Z8673 Personal history of transient ischemic attack (TIA), and cerebral infarction without residual deficits: Secondary | ICD-10-CM

## 2022-05-30 ENCOUNTER — Ambulatory Visit (INDEPENDENT_AMBULATORY_CARE_PROVIDER_SITE_OTHER): Payer: HMO | Admitting: Legal Medicine

## 2022-05-30 ENCOUNTER — Encounter: Payer: Self-pay | Admitting: Legal Medicine

## 2022-05-30 VITALS — BP 100/60 | HR 70 | Temp 98.0°F | Resp 15 | Ht 72.0 in | Wt 212.0 lb

## 2022-05-30 DIAGNOSIS — N451 Epididymitis: Secondary | ICD-10-CM

## 2022-05-30 DIAGNOSIS — N50811 Right testicular pain: Secondary | ICD-10-CM | POA: Diagnosis not present

## 2022-05-30 DIAGNOSIS — N50812 Left testicular pain: Secondary | ICD-10-CM | POA: Diagnosis not present

## 2022-05-30 LAB — POCT URINALYSIS DIPSTICK
Bilirubin, UA: NEGATIVE
Blood, UA: NEGATIVE
Glucose, UA: POSITIVE — AB
Ketones, UA: NEGATIVE
Leukocytes, UA: NEGATIVE
Nitrite, UA: NEGATIVE
Protein, UA: NEGATIVE
Spec Grav, UA: 1.015 (ref 1.010–1.025)
Urobilinogen, UA: NEGATIVE E.U./dL — AB
pH, UA: 6 (ref 5.0–8.0)

## 2022-05-30 MED ORDER — SULFAMETHOXAZOLE-TRIMETHOPRIM 800-160 MG PO TABS: 1.0000 | ORAL_TABLET | Freq: Two times a day (BID) | ORAL | 0 refills | Status: DC

## 2022-05-30 NOTE — Progress Notes (Signed)
Acute Office Visit  Subjective:    Patient ID: Mark Stewart, male    DOB: 05/09/1946, 76 y.o.   MRN: [id number]  Chief Complaint  Patient presents with   Testicle Pain    Left    HPI: Patient is in today for sharp pain on left testicle since 4 days ago. He feels his left leg is a little weak for the pain. He denied fever, chills, or discharge. Shooting pains off and on.  No dysuria.  No fever or chills.  Past Medical History:  Diagnosis Date   Allergy    Cancer ([place])    skin, hand   Cataract    history of surgery bilaterally   Hypercholesteremia    Hypertension    ICAO (internal carotid artery occlusion)    Spleen absent    patient states had spleen removed in high school   Stroke [facility]) 02/2016    Past Surgical History:  Procedure Laterality Date   EYE SURGERY     HERNIA REPAIR  02/28/2018   HERNIA REPAIR Bilateral 11/2019   PROSTATE SURGERY     SPLENECTOMY      Family History  Problem Relation Age of Onset   Breast cancer Mother    Diabetes Mellitus II Father    Breast cancer Sister    Sleep apnea Neg Hx     Social History   Socioeconomic History   Marital status: Married    Spouse name: graye   Number of children: Not on file   Years of education: Not on file   Highest education level: Not on file  Occupational History   Not on file  Tobacco Use   Smoking status: Former    Packs/day: 1.00    Years: 20.00    Total pack years: 20.00    Types: Cigarettes   Smokeless tobacco: Never   Tobacco comments:    patient states last cigarette 35 years ago  Substance and Sexual Activity   Alcohol use: No   Drug use: No   Sexual activity: Not Currently  Other Topics Concern   Not on file  Social History Narrative   Lives with wife   Right Handed   Drinks 4-5 cups caffeine daily   Social Determinants of Health   Financial Resource Strain: Low Risk  (08/29/2020)   Overall Financial Resource Strain (CARDIA)    Difficulty of Paying Living Expenses:  Not hard at all  Food Insecurity: No Food Insecurity (08/29/2020)   Hunger Vital Sign    Worried About Running Out of Food in the Last Year: Never true    Ran Out of Food in the Last Year: Never true  Transportation Needs: No Transportation Needs (08/29/2020)   PRAPARE - Hydrologist (Medical): No    Lack of Transportation (Non-Medical): No  Physical Activity: Not on file  Stress: No Stress Concern Present (08/29/2020)   [place]    Feeling of Stress : Not at all  Social Connections: Moderately Integrated (08/29/2020)   Social Connection and Isolation Panel [NHANES]    Frequency of Communication with Friends and Family: Twice a week    Frequency of Social Gatherings with Friends and Family: Twice a week    Attends Religious Services: More than 4 times per year    Active Member of [employer] or Organizations: No    Attends Archivist Meetings: Never    Marital Status: Married  Intimate  Partner Violence: Not At Risk (08/29/2020)   Humiliation, Afraid, Rape, and Kick questionnaire    Fear of Current or Ex-Partner: No    Emotionally Abused: No    Physically Abused: No    Sexually Abused: No    Outpatient Medications Prior to Visit  Medication Sig Dispense Refill   amLODipine (NORVASC) 10 MG tablet TAKE 1 TABLET BY MOUTH EVERY DAY 90 tablet 2   aspirin EC 81 MG tablet Take 81 mg by mouth daily.     atorvastatin (LIPITOR) 80 MG tablet TAKE 1 TABLET BY MOUTH EVERY DAY 90 tablet 1   buPROPion (WELLBUTRIN XL) 150 MG 24 hr tablet Take 150 mg by mouth daily.     carvedilol (COREG) 25 MG tablet TAKE 2 TABLETS BY MOUTH 2 TIMES DAILY. CALL FOR APPOINTMENT 360 tablet 2   Cholecalciferol (VITAMIN D3 PO) Take by mouth daily.     cloNIDine (CATAPRES - DOSED IN MG/24 HR) 0.2 mg/24hr patch UNWRAP AND APPLY 1 PATCH TO SKIN WEEKLY 4 patch 1   empagliflozin (JARDIANCE) 25 MG TABS tablet Take 25 mg by  mouth daily.      fluticasone (FLONASE) 50 MCG/ACT nasal spray Place 2 sprays into both nostrils daily as needed for allergies or rhinitis. Reported on 04/26/2016 18.2 mL 2   hydrochlorothiazide (HYDRODIURIL) 25 MG tablet Take 1 tablet (25 mg total) by mouth daily. 90 tablet 2   Insulin Disposable Pump (OMNIPOD DASH 5 PACK PODS) MISC      meclizine (ANTIVERT) 25 MG tablet Take 1 tablet (25 mg total) by mouth 3 (three) times daily as needed for dizziness. 30 tablet 0   minoxidil (LONITEN) 10 MG tablet TAKE 1/2 TABLET BY MOUTH EVERY DAY 45 tablet 2   NOVOLOG 100 UNIT/ML injection      omeprazole (PRILOSEC) 40 MG capsule TAKE 1 CAPSULE BY MOUTH EVERY DAY 90 capsule 2   valsartan (DIOVAN) 320 MG tablet TAKE 1 TABLET BY MOUTH EVERY DAY 90 tablet 2   spironolactone (ALDACTONE) 50 MG tablet Take 1 tablet (50 mg total) by mouth daily. Schedule office visit for future refills. 30 tablet 0   No facility-administered medications prior to visit.    Allergies  Allergen Reactions   Bactrim [Sulfamethoxazole-Trimethoprim] Hives   Benazepril Cough   Metformin And Related Rash   Penicillins Rash    Has patient had a PCN reaction causing immediate rash, facial/tongue/throat swelling, SOB or lightheadedness with hypotension: yes Has patient had a PCN reaction causing severe rash involving mucus membranes or skin necrosis: no Has patient had a PCN reaction that required hospitalization: no Has patient had a PCN reaction occurring within the last 10 years: no If all of the above answers are "NO", then may proceed with Cephalosporin use.     Review of Systems  Constitutional:  Negative for chills, fatigue, fever and unexpected weight change.  HENT:  Negative for congestion, ear pain, sinus pain and sore throat.   Eyes:  Negative for visual disturbance.  Respiratory:  Negative for cough and shortness of breath.   Cardiovascular:  Negative for chest pain and palpitations.  Gastrointestinal:  Negative for  abdominal pain, blood in stool, constipation, diarrhea, nausea and vomiting.  Endocrine: Negative for polydipsia.  Genitourinary:  Positive for testicular pain (Left). Negative for dysuria.  Musculoskeletal:  Negative for back pain.  Skin:  Negative for rash.  Neurological:  Negative for headaches.       Objective:    Physical Exam Vitals reviewed.  Constitutional:  General: He is not in acute distress.    Appearance: Normal appearance.  HENT:     Head: Normocephalic and atraumatic.     Right Ear: Tympanic membrane normal.     Left Ear: Tympanic membrane normal.     Nose: Nose normal.     Mouth/Throat:     Mouth: Mucous membranes are moist.     Pharynx: Oropharynx is clear.  Eyes:     Conjunctiva/sclera: Conjunctivae normal.     Pupils: Pupils are equal, round, and reactive to light.  Cardiovascular:     Rate and Rhythm: Normal rate and regular rhythm.     Pulses: Normal pulses.     Heart sounds: Normal heart sounds. No murmur heard.    No gallop.  Pulmonary:     Effort: Pulmonary effort is normal. No respiratory distress.     Breath sounds: Normal breath sounds. No wheezing.  Abdominal:     General: Abdomen is flat. Bowel sounds are normal. There is no distension.     Tenderness: There is no abdominal tenderness.  Genitourinary:    Prostate: Normal.     Comments: Pain left epididymus Musculoskeletal:        General: Normal range of motion.  Skin:    General: Skin is warm.  Neurological:     Mental Status: He is alert.     BP 100/60   Pulse 70   Temp 98 F (36.7 C)   Resp 15   Ht 6' (1.829 m)   Wt 212 lb (96.2 kg)   SpO2 95%   BMI 28.75 kg/m  Wt Readings from Last 3 Encounters:  05/31/22 216 lb (98 kg)  05/30/22 212 lb (96.2 kg)  04/26/22 219 lb 9.6 oz (99.6 kg)    Health Maintenance Due  Topic Date Due   OPHTHALMOLOGY EXAM  Never done   Hepatitis C Screening  Never done   TETANUS/TDAP  Never done   Zoster Vaccines- Shingrix (1 of 2) Never  done   COVID-19 Vaccine (4 - Moderna series) 11/18/2020   HEMOGLOBIN A1C  09/20/2021   FOOT EXAM  03/21/2022   INFLUENZA VACCINE  05/08/2022       No results found for: "TSH" Lab Results  Component Value Date   WBC 6.1 03/21/2021   HGB 15.0 03/21/2021   HCT 44.5 03/21/2021   MCV 92 03/21/2021   PLT 260 03/21/2021   Lab Results  Component Value Date   NA 139 03/21/2021   K 4.0 03/21/2021   CO2 22 03/21/2021   GLUCOSE 121 (H) 03/21/2021   BUN 24 03/21/2021   CREATININE 1.20 03/21/2021   BILITOT 0.6 03/21/2021   ALKPHOS 89 03/21/2021   AST 13 03/21/2021   ALT 15 03/21/2021   PROT 6.9 03/21/2021   ALBUMIN 3.9 03/21/2021   CALCIUM 9.3 03/21/2021   ANIONGAP 8 06/16/2020   EGFR 63 03/21/2021   Lab Results  Component Value Date   CHOL 137 03/21/2021   Lab Results  Component Value Date   HDL 32 (L) 03/21/2021   Lab Results  Component Value Date   LDLCALC 83 03/21/2021   Lab Results  Component Value Date   TRIG 124 03/21/2021   Lab Results  Component Value Date   CHOLHDL 4.3 03/21/2021   Lab Results  Component Value Date   HGBA1C 8.6 (H) 03/21/2021       Assessment & Plan:   Problem List Items Addressed This Visit   None Visit Diagnoses  Pain in both testicles    -  Primary   Relevant Orders   POCT urinalysis dipstick (Completed) Urinalysis normal, pain mostly in left spermatic cord.   Epididymitis, left       Relevant Orders   Urine Culture (Completed) Patient has small painful area of epidymus possibly appendix that is swollen.  Use tight underwear.  Take antibiotic      Meds ordered this encounter  Medications   DISCONTD: sulfamethoxazole-trimethoprim (BACTRIM DS) 800-160 MG tablet    Sig: Take 1 tablet by mouth 2 (two) times daily.    Dispense:  20 tablet    Refill:  0    Orders Placed This Encounter  Procedures   Urine Culture   POCT urinalysis dipstick     Follow-up: Return in about 2 weeks (around 06/13/2022), or if  needed.  An After Visit Summary was printed and given to the patient.  Reinaldo Meeker, MD [facility] [phone]

## 2022-05-31 ENCOUNTER — Ambulatory Visit (INDEPENDENT_AMBULATORY_CARE_PROVIDER_SITE_OTHER): Payer: HMO | Admitting: Family Medicine

## 2022-05-31 ENCOUNTER — Telehealth: Payer: Self-pay

## 2022-05-31 ENCOUNTER — Ambulatory Visit: Payer: HMO | Admitting: Podiatry

## 2022-05-31 ENCOUNTER — Other Ambulatory Visit: Payer: Self-pay | Admitting: Cardiovascular Disease

## 2022-05-31 VITALS — BP 120/60 | HR 60 | Temp 97.3°F | Resp 16 | Ht 72.0 in | Wt 216.0 lb

## 2022-05-31 DIAGNOSIS — N451 Epididymitis: Secondary | ICD-10-CM

## 2022-05-31 DIAGNOSIS — L509 Urticaria, unspecified: Secondary | ICD-10-CM | POA: Diagnosis not present

## 2022-05-31 MED ORDER — TRIAMCINOLONE ACETONIDE 40 MG/ML IJ SUSP
80.0000 mg | Freq: Once | INTRAMUSCULAR | Status: AC
  Administered 2022-05-31: 80 mg via INTRAMUSCULAR

## 2022-05-31 MED ORDER — PREDNISONE 10 MG PO TABS: ORAL_TABLET | ORAL | 0 refills | Status: AC

## 2022-05-31 NOTE — Progress Notes (Signed)
Acute Office Visit  Subjective:    Patient ID: Mark Stewart, male    DOB: [birth year], 76 y.o.   MRN: [id number]  Chief Complaint  Patient presents with   Rash    HPI: Patient is in today for possible allergic reaction to bactrim ds. hives.  He awakened around 5 am with itching and rash.  He took Benadryl 25  mg (2 tablets) with some relief.  He repeated the Bendaryl a short while ago.  He denies shortness of breath or any respiratory difficulty.    Past Medical History:  Diagnosis Date   Allergy    Cancer ([place])    skin, hand   Cataract    history of surgery bilaterally   Hypercholesteremia    Hypertension    ICAO (internal carotid artery occlusion)    Spleen absent    patient states had spleen removed in high school   Stroke [facility]) 02/2016    Past Surgical History:  Procedure Laterality Date   EYE SURGERY     HERNIA REPAIR  02/28/2018   HERNIA REPAIR Bilateral 11/2019   PROSTATE SURGERY     SPLENECTOMY      Family History  Problem Relation Age of Onset   Breast cancer Mother    Diabetes Mellitus II Father    Breast cancer Sister    Sleep apnea Neg Hx     Social History   Socioeconomic History   Marital status: Married    Spouse name: graye   Number of children: Not on file   Years of education: Not on file   Highest education level: Not on file  Occupational History   Not on file  Tobacco Use   Smoking status: Former    Packs/day: 1.00    Years: 20.00    Total pack years: 20.00    Types: Cigarettes   Smokeless tobacco: Never   Tobacco comments:    patient states last cigarette 35 years ago  Substance and Sexual Activity   Alcohol use: No   Drug use: No   Sexual activity: Not Currently  Other Topics Concern   Not on file  Social History Narrative   Lives with wife   Right Handed   Drinks 4-5 cups caffeine daily   Social Determinants of Health   Financial Resource Strain: Low Risk  (08/29/2020)   Overall Financial Resource Strain  (CARDIA)    Difficulty of Paying Living Expenses: Not hard at all  Food Insecurity: No Food Insecurity (08/29/2020)   Hunger Vital Sign    Worried About Running Out of Food in the Last Year: Never true    Ran Out of Food in the Last Year: Never true  Transportation Needs: No Transportation Needs (08/29/2020)   PRAPARE - Hydrologist (Medical): No    Lack of Transportation (Non-Medical): No  Physical Activity: Not on file  Stress: No Stress Concern Present (08/29/2020)   [place]    Feeling of Stress : Not at all  Social Connections: Moderately Integrated (08/29/2020)   Social Connection and Isolation Panel [NHANES]    Frequency of Communication with Friends and Family: Twice a week    Frequency of Social Gatherings with Friends and Family: Twice a week    Attends Religious Services: More than 4 times per year    Active Member of [employer] or Organizations: No    Attends Archivist Meetings: Never  Marital Status: Married  Human resources officer Violence: Not At Risk (08/29/2020)   Humiliation, Afraid, Rape, and Kick questionnaire    Fear of Current or Ex-Partner: No    Emotionally Abused: No    Physically Abused: No    Sexually Abused: No    Outpatient Medications Prior to Visit  Medication Sig Dispense Refill   diphenhydrAMINE (BENADRYL) 25 MG tablet Take 25 mg by mouth every 6 (six) hours as needed.     amLODipine (NORVASC) 10 MG tablet TAKE 1 TABLET BY MOUTH EVERY DAY 90 tablet 2   aspirin EC 81 MG tablet Take 81 mg by mouth daily.     atorvastatin (LIPITOR) 80 MG tablet TAKE 1 TABLET BY MOUTH EVERY DAY 90 tablet 1   buPROPion (WELLBUTRIN XL) 150 MG 24 hr tablet Take 150 mg by mouth daily.     carvedilol (COREG) 25 MG tablet TAKE 2 TABLETS BY MOUTH 2 TIMES DAILY. CALL FOR APPOINTMENT 360 tablet 2   Cholecalciferol (VITAMIN D3 PO) Take by mouth daily.     cloNIDine (CATAPRES -  DOSED IN MG/24 HR) 0.2 mg/24hr patch UNWRAP AND APPLY 1 PATCH TO SKIN WEEKLY 4 patch 1   empagliflozin (JARDIANCE) 25 MG TABS tablet Take 25 mg by mouth daily.      fluticasone (FLONASE) 50 MCG/ACT nasal spray Place 2 sprays into both nostrils daily as needed for allergies or rhinitis. Reported on 04/26/2016 18.2 mL 2   hydrochlorothiazide (HYDRODIURIL) 25 MG tablet Take 1 tablet (25 mg total) by mouth daily. 90 tablet 2   Insulin Disposable Pump (OMNIPOD DASH 5 PACK PODS) MISC      meclizine (ANTIVERT) 25 MG tablet Take 1 tablet (25 mg total) by mouth 3 (three) times daily as needed for dizziness. 30 tablet 0   minoxidil (LONITEN) 10 MG tablet TAKE 1/2 TABLET BY MOUTH EVERY DAY 45 tablet 2   NOVOLOG 100 UNIT/ML injection      omeprazole (PRILOSEC) 40 MG capsule TAKE 1 CAPSULE BY MOUTH EVERY DAY 90 capsule 2   valsartan (DIOVAN) 320 MG tablet TAKE 1 TABLET BY MOUTH EVERY DAY 90 tablet 2   spironolactone (ALDACTONE) 50 MG tablet Take 1 tablet (50 mg total) by mouth daily. Schedule office visit for future refills. 30 tablet 0   sulfamethoxazole-trimethoprim (BACTRIM DS) 800-160 MG tablet Take 1 tablet by mouth 2 (two) times daily. 20 tablet 0   No facility-administered medications prior to visit.    Allergies  Allergen Reactions   Bactrim [Sulfamethoxazole-Trimethoprim] Hives   Benazepril Cough   Metformin And Related Rash   Penicillins Rash    Has patient had a PCN reaction causing immediate rash, facial/tongue/throat swelling, SOB or lightheadedness with hypotension: yes Has patient had a PCN reaction causing severe rash involving mucus membranes or skin necrosis: no Has patient had a PCN reaction that required hospitalization: no Has patient had a PCN reaction occurring within the last 10 years: no If all of the above answers are "NO", then may proceed with Cephalosporin use.     Review of Systems  Constitutional:  Negative for chills and fatigue.  Respiratory:  Negative for cough and  shortness of breath.   Skin:  Positive for rash.       Started with hives around 5 am this morning.        Objective:    Physical Exam  BP 120/60   Pulse 60   Temp (!) 97.3 F (36.3 C)   Resp 16   Ht  6' (1.829 m)   Wt 216 lb (98 kg)   BMI 29.29 kg/m  Wt Readings from Last 3 Encounters:  05/31/22 216 lb (98 kg)  05/30/22 212 lb (96.2 kg)  04/26/22 219 lb 9.6 oz (99.6 kg)    Health Maintenance Due  Topic Date Due   OPHTHALMOLOGY EXAM  Never done   Hepatitis C Screening  Never done   TETANUS/TDAP  Never done   Zoster Vaccines- Shingrix (1 of 2) Never done   COVID-19 Vaccine (4 - Moderna series) 11/18/2020   HEMOGLOBIN A1C  09/20/2021   FOOT EXAM  03/21/2022   INFLUENZA VACCINE  05/08/2022    There are no preventive care reminders to display for this patient.   No results found for: "TSH" Lab Results  Component Value Date   WBC 6.1 03/21/2021   HGB 15.0 03/21/2021   HCT 44.5 03/21/2021   MCV 92 03/21/2021   PLT 260 03/21/2021   Lab Results  Component Value Date   NA 139 03/21/2021   K 4.0 03/21/2021   CO2 22 03/21/2021   GLUCOSE 121 (H) 03/21/2021   BUN 24 03/21/2021   CREATININE 1.20 03/21/2021   BILITOT 0.6 03/21/2021   ALKPHOS 89 03/21/2021   AST 13 03/21/2021   ALT 15 03/21/2021   PROT 6.9 03/21/2021   ALBUMIN 3.9 03/21/2021   CALCIUM 9.3 03/21/2021   ANIONGAP 8 06/16/2020   EGFR 63 03/21/2021   Lab Results  Component Value Date   CHOL 137 03/21/2021   Lab Results  Component Value Date   HDL 32 (L) 03/21/2021   Lab Results  Component Value Date   LDLCALC 83 03/21/2021   Lab Results  Component Value Date   TRIG 124 03/21/2021   Lab Results  Component Value Date   CHOLHDL 4.3 03/21/2021   Lab Results  Component Value Date   HGBA1C 8.6 (H) 03/21/2021       Assessment & Plan:   Problem List Items Addressed This Visit       Musculoskeletal and Integument   Urticaria - Primary    Kenalog 80 mg IM.  Zyrtec 10 mg 1-2 times  daily as needed for itching/hives for 3-4 days.  Prednisone taper.        Genitourinary   Epididymitis, left    Start doxycycline in 3 days.  Doxycycline 100 mg twice daily x 10 days.          Follow-up: Return if symptoms worsen or fail to improve.  An After Visit Summary was printed and given to the patient.  Rochel Brome, MD [facility] [phone]

## 2022-05-31 NOTE — Patient Instructions (Addendum)
For Rash: Zyrtec 10 mg 1-2 times daily as needed for itching/hives for 3-4 days.  Prednisone taper.  For epididymitis (testicular pain.) Start doxycycline in 3 days.  Doxycycline 100 mg twice daily x 10 days.

## 2022-05-31 NOTE — Telephone Encounter (Signed)
Patient's wife called office stating that the patient was seen yesterday by Dr. Henrene Pastor for testicular pain and was given bactrim ds. Patient started the medication at 8 pm last night with food and woke up at 4am this morning with Hives on his chest,back, and torso and belly. Patient then took 2 benadryl and applied topical ointment to the itching areas. Then patient went on to finally go back to sleep and patient woke up at 10 am and then took a shower and stated that the hives were still there and that they hurt more than they itched. Patient took 1 more benadryl at 1pm and hives still have not gotten any better. Dr. Tobie Poet was consulted with and she stated that the patient needed to come on to the office now due to possible reactions with the certain medication that can sometimes occur to take a better look at what Is going on. Patient's wife was informed and is getting the patient to the office now.

## 2022-06-01 DIAGNOSIS — L509 Urticaria, unspecified: Secondary | ICD-10-CM | POA: Insufficient documentation

## 2022-06-01 DIAGNOSIS — N451 Epididymitis: Secondary | ICD-10-CM | POA: Insufficient documentation

## 2022-06-01 NOTE — Assessment & Plan Note (Signed)
Start doxycycline in 3 days.  Doxycycline 100 mg twice daily x 10 days.

## 2022-06-01 NOTE — Assessment & Plan Note (Addendum)
Kenalog 80 mg IM.  Zyrtec 10 mg 1-2 times daily as needed for itching/hives for 3-4 days.  Prednisone taper.

## 2022-06-02 ENCOUNTER — Other Ambulatory Visit: Payer: Self-pay | Admitting: Legal Medicine

## 2022-06-02 DIAGNOSIS — N3000 Acute cystitis without hematuria: Secondary | ICD-10-CM

## 2022-06-02 LAB — URINE CULTURE

## 2022-06-02 MED ORDER — CEPHALEXIN 500 MG PO CAPS: 500.0000 mg | ORAL_CAPSULE | Freq: Two times a day (BID) | ORAL | 0 refills | Status: DC

## 2022-06-02 NOTE — Progress Notes (Signed)
Urine culture beta strep since allergic to penicillin, try keflex called in lp

## 2022-06-05 ENCOUNTER — Encounter: Payer: Self-pay | Admitting: Podiatry

## 2022-06-06 NOTE — Procedures (Signed)
[facility] PAP TITRATION INTERPRETATION REPORT   STUDY DATE: 05/28/2022      PATIENT NAME:  Mark Stewart         DATE OF BIRTH:  12-06-1945  PATIENT ID:  [id number]    TYPE OF STUDY:  CPAP  READING PHYSICIAN: Star Age, MD, PhD SCORING TECHNICIAN: Forde Radon, RPSGT   INDICATIONS:  76 year old right-handed gentleman with an underlying medical history of hypertension, hyperlipidemia, status post splenectomy as a teenager, stroke in 2017, diabetes, cataracts, allergies, and overweight state, who presents for presents for a full night titration study to treat his severe obstructive sleep apnea. His home sleep test from 08/17/2020 indicated an AHI of 51.7/h, O2 nadir 86%. He has had difficulty tolerating AutoPap therapy.The Epworth Sleepiness Scale was 21 out of 24 (scores above or equal to 10 are suggestive of hypersomnolence). ADDITIONAL INFORMATION:  Height: 72.0 in Weight: 219 lb (BMI 29) Neck Size: 0.0 in Medications: Norvasc, Aspirin, Lipitor, Wellbutrin XL, Coreg, Vitamin D3, Catapres, Jardiance, Flonase, Hydrodiuril, Novolog, Antivert, Loniten, Prilosec, Aldactone, Diovan  DESCRIPTION: A sleep technologist was in attendance for the duration of the recording.  Data collection, scoring, video monitoring, and reporting were performed in compliance with the AASM Manual for the Scoring of Sleep and Associated Events; (Hypopnea is scored based on the criteria listed in Section VIII D. 1b in the AASM Manual V2.6 using a 4% oxygen desaturation rule or Hypopnea is scored based on the criteria listed in Section VIII D. 1a in the AASM Manual V2.6 using 3% oxygen desaturation and /or arousal rule).  A physician certified by the American Board of Sleep Medicine reviewed each epoch of the study.   FINDINGS:  Please refer to the attached summary for additional quantitative information.  The patient was fitted with a small/medium Evora fullface mask from  [employer]. CPAP was initiated at a pressure of 5 cm and gradually titrated to a final pressure of 9 cm. He achieved 80.5 minutes of total sleep time with an AHI of 0/h. Non-supine REM sleep was achieved, O2 nadir of 92%. SLEEP CONTINUITY AND SLEEP ARCHITECTURE:  Lights out was at 21:31: and lights on 05:06: (7.6 hours in bed). Total sleep time was 328.0 minutes (0.0% supine;  100.0% lateral;  0.0% prone, 14.3% REM sleep), with a decreased sleep efficiency at 72.1%. Sleep latency was normal at 8.5 minutes.  REM sleep latency was slightly below normal at 64.0 minutes. Of the total sleep time, the percentage of stage N1 sleep was 4.1%, stage N2 sleep was 81.6%, which is markedly increased, stage N3 sleep was absent, and REM sleep was 14.3%, which is reduced. There were 4 Stage R periods observed on this study night, 12 awakenings (i.e. transitions to Stage W from any sleep stage), and 40.0 total stage transitions. Wake after sleep onset (WASO) time accounted for 118 minutes with 3 longer periods of wakefulness and otherwise mild sleep fragmentation noted.  AROUSAL: There were 12 arousals in total, for an arousal index of 2.2 arousals/hour.  Of these, 4 were identified as respiratory-related arousals (0.7 /hr), 0 were PLM-related arousals (0.0 /hr), and 17 were non-specific arousals (3.1 /hr)  RESPIRATORY MONITORING:  Based on CMS criteria (using a 4% oxygen desaturation rule for scoring hypopneas), there were 8 apneas (1 obstructive; 0 central; 7 mixed), and 6 hypopneas.  Apnea index was 1.5. Hypopnea index was 1.1. The apnea-hypopnea index was 2.6 overall (0.0 supine, 0.0 non-supine; 0.0 REM, 0.0 supine REM). There were 0 respiratory  effort-related arousals (RERAs).  The RERA index was 0.0 events/hr. Total respiratory disturbance index (RDI) was 2.6 events/hr. RDI results showed: supine RDI  0.0 /hr; non-supine RDI 2.6 /hr; REM RDI 0.0 /hr, supine REM RDI 0.0 /hr.   Based on AASM criteria (using a 3% oxygen  desaturation and /or arousal rule for scoring hypopneas), there were 8 apneas (1 obstructive; 0 central; 7 mixed), and 6 hypopneas. Apnea index was 1.5. Hypopnea index was 1.1. The apnea-hypopnea index was 2.6 overall (0.0 supine, 0.0 non-supine; 0.0 REM, 0.0 supine REM). There were 0 respiratory effort-related arousals (RERAs).  The RERA index was 0.0 events/hr. Total respiratory disturbance index (RDI) was 2.6 events/hr. RDI results showed: supine RDI  0.0 /hr; non-supine RDI 2.6 /hr; REM RDI 0.0 /hr, supine REM RDI 0.0 /hr.  Respiratory events were associated with oxyhemoglobin desaturations (nadir during sleep 86%) from a mean of 92%). There were 0 occurrences of Cheyne Stokes breathing. LIMB MOVEMENTS: There were 0 periodic limb movements of sleep (0.0/hr), of which 0 (0.0/hr) were associated with an arousal. OXIMETRY: Total sleep time spent at, or below 88% was 9.0 minutes, or 2.7% of total sleep time. Snoring was classified as improved with CPAP. BODY POSITION: Duration of total sleep and percent of total sleep in their respective position is as follows: supine 00 minutes (0.0%), non-supine 328.0 minutes (100.0%); right 45 minutes (13.7%), left 283 minutes (86.3%), and prone 00 minutes (0.0%). Total supine REM sleep time was 00 minutes (0.0% of total REM sleep).  EEG: With the limited montage recorded, no EEG abnormalities were observed. CARDIAC: The electrocardiogram showed normal sinus rhythm.  The average heart rate during sleep was 64 bpm.  The maximum heart rate during sleep was 76 bpm. The maximum heart rate during recording was 108.    BEHAVIORAL: No significant parasomnia behavior noted. The video and audio analysis did not show any abnormal or unusual behaviors, movements, phonations or vocalizations. The patient's glucose monitor alarm went off a few times during the study. The patient had to drink a protein shake and eat some crackers during the study. He did not take a bathroom  break. Post study, the patient indicated that sleep was the same as usual.  IMPRESSION/DIAGNOSES:  1. Obstructive sleep apnea (OSA) 2. Dysfunctions associated with sleep stages or arousal from sleep RECOMMENDATIONS:   1. This study demonstrates resolution of the patient's obstructive sleep apnea with CPAP therapy; however, supine sleep was not achieved. I will recommend home CPAP treatment at a pressure of 9 cm via small/medium Evora fullface mask from [employer] with heated humidity. The patient will be advised to be fully compliant with PAP therapy to improve sleep related symptoms and decrease long term cardiovascular risks. The patient should be reminded, that it may take up to 3 months to get fully used to using PAP with all planned sleep. The earlier full compliance is achieved, the better long term compliance tends to be. Please note that untreated obstructive sleep apnea may carry additional perioperative morbidity. Patients with significant obstructive sleep apnea should receive perioperative PAP therapy and the surgeons and particularly the anesthesiologist should be informed of the diagnosis and the severity of the sleep disordered breathing. 2. This study shows sleep fragmentation and abnormal sleep stage percentages; these are nonspecific findings and per se do not signify an intrinsic sleep disorder or a cause for the patient's sleep-related symptoms. Causes include (but are not limited to) the first night effect of the sleep study, circadian rhythm disturbances, medication  effect or an underlying mood disorder or medical problem.  3. The patient should be cautioned not to drive, work at heights, or operate dangerous or heavy equipment when tired or sleepy. Review and reiteration of good sleep hygiene measures should be pursued with any patient. 4. The patient will be seen in follow-up in the sleep clinic at [facility] for discussion of the test results, symptom and treatment compliance review,  further management strategies, etc. The referring provider will be notified of the test results.  I certify that I have reviewed the entire raw data recording prior to the issuance of this report in accordance with the Standards of Accreditation of the American Academy of Sleep Medicine (AASM).   Star Age,  MD, PhD

## 2022-06-06 NOTE — Addendum Note (Signed)
Addended by: Star Age on: 06/06/2022 02:00 PM   Modules accepted: Orders

## 2022-06-08 ENCOUNTER — Encounter: Payer: Self-pay | Admitting: Family Medicine

## 2022-06-09 ENCOUNTER — Other Ambulatory Visit: Payer: Self-pay | Admitting: Cardiovascular Disease

## 2022-06-12 ENCOUNTER — Telehealth: Payer: Self-pay

## 2022-06-12 NOTE — Telephone Encounter (Signed)
I called patient to discuss three times. Each time someone picks up the phone but then the call is dropped.  We will try again later. If patient calls back another day please route to POD 4.

## 2022-06-12 NOTE — Telephone Encounter (Signed)
-----   Message from Star Age, MD sent at 06/06/2022  1:59 PM EDT ----- Patient had a CPAP titration study on 05/28/2022.  He had trouble tolerating AutoPap.  Please call and inform patient that I have entered an order for treatment with positive airway pressure (PAP) treatment for obstructive sleep apnea (OSA). He did well during the latest sleep study with CPAP. We will send a prescription to change from AutoPap therapy to CPAP of 9 cm.  We also used a different mask during the study. We will send the order to his current DME company.  Please encourage full compliance with treatment and arrange for follow-up after CPAP start in 2 to 3 months, he may see Janett Billow or myself.

## 2022-06-14 DIAGNOSIS — E113312 Type 2 diabetes mellitus with moderate nonproliferative diabetic retinopathy with macular edema, left eye: Secondary | ICD-10-CM | POA: Diagnosis not present

## 2022-06-14 DIAGNOSIS — H35372 Puckering of macula, left eye: Secondary | ICD-10-CM | POA: Diagnosis not present

## 2022-06-14 DIAGNOSIS — H04123 Dry eye syndrome of bilateral lacrimal glands: Secondary | ICD-10-CM | POA: Diagnosis not present

## 2022-06-14 LAB — HM DIABETES EYE EXAM

## 2022-06-14 NOTE — Telephone Encounter (Signed)
I was able to reach the patient and we discussed his sleep study results. The patient is amenable to switching from autopap to cpap and getting a new mask. He will await a call from [employer]. I sent the order to [employer].

## 2022-06-18 DIAGNOSIS — Z9641 Presence of insulin pump (external) (internal): Secondary | ICD-10-CM | POA: Diagnosis not present

## 2022-06-18 DIAGNOSIS — E1142 Type 2 diabetes mellitus with diabetic polyneuropathy: Secondary | ICD-10-CM | POA: Diagnosis not present

## 2022-06-18 DIAGNOSIS — E785 Hyperlipidemia, unspecified: Secondary | ICD-10-CM | POA: Diagnosis not present

## 2022-06-18 DIAGNOSIS — Z7984 Long term (current) use of oral hypoglycemic drugs: Secondary | ICD-10-CM | POA: Diagnosis not present

## 2022-06-18 DIAGNOSIS — Z794 Long term (current) use of insulin: Secondary | ICD-10-CM | POA: Diagnosis not present

## 2022-06-18 NOTE — Telephone Encounter (Addendum)
[employer] confirmed receipt of the order on 06/15/22.

## 2022-06-20 ENCOUNTER — Encounter: Payer: Self-pay | Admitting: Legal Medicine

## 2022-06-26 ENCOUNTER — Ambulatory Visit (INDEPENDENT_AMBULATORY_CARE_PROVIDER_SITE_OTHER): Payer: HMO

## 2022-06-26 DIAGNOSIS — Z0000 Encounter for general adult medical examination without abnormal findings: Secondary | ICD-10-CM | POA: Diagnosis not present

## 2022-06-27 ENCOUNTER — Other Ambulatory Visit: Payer: Self-pay | Admitting: Cardiovascular Disease

## 2022-06-28 ENCOUNTER — Ambulatory Visit: Payer: HMO | Admitting: Podiatry

## 2022-06-28 ENCOUNTER — Encounter: Payer: Self-pay | Admitting: Podiatry

## 2022-06-28 ENCOUNTER — Telehealth: Payer: Self-pay | Admitting: Podiatry

## 2022-06-28 DIAGNOSIS — B351 Tinea unguium: Secondary | ICD-10-CM | POA: Diagnosis not present

## 2022-06-28 DIAGNOSIS — M2041 Other hammer toe(s) (acquired), right foot: Secondary | ICD-10-CM | POA: Diagnosis not present

## 2022-06-28 DIAGNOSIS — E119 Type 2 diabetes mellitus without complications: Secondary | ICD-10-CM | POA: Diagnosis not present

## 2022-06-28 DIAGNOSIS — M79674 Pain in right toe(s): Secondary | ICD-10-CM | POA: Diagnosis not present

## 2022-06-28 DIAGNOSIS — M2042 Other hammer toe(s) (acquired), left foot: Secondary | ICD-10-CM | POA: Diagnosis not present

## 2022-06-28 DIAGNOSIS — M79675 Pain in left toe(s): Secondary | ICD-10-CM

## 2022-06-28 DIAGNOSIS — E1142 Type 2 diabetes mellitus with diabetic polyneuropathy: Secondary | ICD-10-CM | POA: Diagnosis not present

## 2022-06-28 DIAGNOSIS — M2142 Flat foot [pes planus] (acquired), left foot: Secondary | ICD-10-CM | POA: Diagnosis not present

## 2022-06-28 DIAGNOSIS — M2141 Flat foot [pes planus] (acquired), right foot: Secondary | ICD-10-CM

## 2022-06-28 NOTE — Telephone Encounter (Signed)
Pts pcp office called stating they needed new orders for pts diabetic shoes.  Upon checking the documents they were needing were sent to the wrong provider. I have corrected the order and sent to the correct pcp (Dr Reinaldo Meeker)

## 2022-07-02 NOTE — Patient Instructions (Signed)
Health Maintenance After Age 76 After age 76, you are at a higher risk for certain long-term diseases and infections as well as injuries from falls. Falls are a major cause of broken bones and head injuries in people who are older than age 76. Getting regular preventive care can help to keep you healthy and well. Preventive care includes getting regular testing and making lifestyle changes as recommended by your health care provider. Talk with your health care provider about: Which screenings and tests you should have. A screening is a test that checks for a disease when you have no symptoms. A diet and exercise plan that is right for you. What should I know about screenings and tests to prevent falls? Screening and testing are the best ways to find a health problem early. Early diagnosis and treatment give you the best chance of managing medical conditions that are common after age 76. Certain conditions and lifestyle choices may make you more likely to have a fall. Your health care provider may recommend: Regular vision checks. Poor vision and conditions such as cataracts can make you more likely to have a fall. If you wear glasses, make sure to get your prescription updated if your vision changes. Medicine review. Work with your health care provider to regularly review all of the medicines you are taking, including over-the-counter medicines. Ask your health care provider about any side effects that may make you more likely to have a fall. Tell your health care provider if any medicines that you take make you feel dizzy or sleepy. Strength and balance checks. Your health care provider may recommend certain tests to check your strength and balance while standing, walking, or changing positions. Foot health exam. Foot pain and numbness, as well as not wearing proper footwear, can make you more likely to have a fall. Screenings, including: Osteoporosis screening. Osteoporosis is a condition that causes  the bones to get weaker and break more easily. Blood pressure screening. Blood pressure changes and medicines to control blood pressure can make you feel dizzy. Depression screening. You may be more likely to have a fall if you have a fear of falling, feel depressed, or feel unable to do activities that you used to do. Alcohol use screening. Using too much alcohol can affect your balance and may make you more likely to have a fall. Follow these instructions at home: Lifestyle Do not drink alcohol if: Your health care provider tells you not to drink. If you drink alcohol: Limit how much you have to: 0-1 drink a day for women. 0-2 drinks a day for men. Know how much alcohol is in your drink. In the [place] one drink equals one 12 oz bottle of beer (355 mL), one 5 oz glass of wine (148 mL), or one 1 oz glass of hard liquor (44 mL). Do not use any products that contain nicotine or tobacco. These products include cigarettes, chewing tobacco, and vaping devices, such as e-cigarettes. If you need help quitting, ask your health care provider. Activity  Follow a regular exercise program to stay fit. This will help you maintain your balance. Ask your health care provider what types of exercise are appropriate for you. If you need a cane or walker, use it as recommended by your health care provider. Wear supportive shoes that have nonskid soles. Safety  Remove any tripping hazards, such as rugs, cords, and clutter. Install safety equipment such as grab bars in bathrooms and safety rails on stairs. Keep rooms and walkways   well-lit. General instructions Talk with your health care provider about your risks for falling. Tell your health care provider if: You fall. Be sure to tell your health care provider about all falls, even ones that seem minor. You feel dizzy, tiredness (fatigue), or off-balance. Take over-the-counter and prescription medicines only as told by your health care provider. These include  supplements. Eat a healthy diet and maintain a healthy weight. A healthy diet includes low-fat dairy products, low-fat (lean) meats, and fiber from whole grains, beans, and lots of fruits and vegetables. Stay current with your vaccines. Schedule regular health, dental, and eye exams. Summary Having a healthy lifestyle and getting preventive care can help to protect your health and wellness after age 76. Screening and testing are the best way to find a health problem early and help you avoid having a fall. Early diagnosis and treatment give you the best chance for managing medical conditions that are more common for people who are older than age 76. Falls are a major cause of broken bones and head injuries in people who are older than age 76. Take precautions to prevent a fall at home. Work with your health care provider to learn what changes you can make to improve your health and wellness and to prevent falls. This information is not intended to replace advice given to you by your health care provider. Make sure you discuss any questions you have with your health care provider. Document Revised: 02/13/2021 Document Reviewed: 02/13/2021 Elsevier Patient Education  2023 [employer]  

## 2022-07-02 NOTE — Progress Notes (Signed)
Subjective:   Mark Stewart is a 76 y.o. male who presents for Medicare Annual/Subsequent preventive examination.   I connected with  Mark Stewart on 07/02/22 by a audio enabled telemedicine application and verified that I am speaking with the correct person using two identifiers.  Patient Location: Home  Provider Location: Office/Clinic  I discussed the limitations of evaluation and management by telemedicine. The patient expressed understanding and agreed to proceed.   Cardiac Risk Factors include: advanced age (>59mn, >>66women);diabetes mellitus;male gender     Objective:    There were no vitals filed for this visit. There is no height or weight on file to calculate BMI.     08/29/2020   11:15 AM 06/16/2020    9:47 PM 05/02/2016   10:55 AM 04/30/2016    1:48 PM 04/12/2016    4:40 PM 04/12/2016    4:12 PM 02/26/2016    4:25 PM  Advanced Directives  Does Patient Have a Medical Advance Directive? Yes No No   No No  Type of AParamedicof [place] will        Copy of [place] Chart? No - copy requested        Would patient like information on creating a medical advance directive?  No - Patient declined Yes - EScientist, clinical (histocompatibility and immunogenetics)given Yes - EScientist, clinical (histocompatibility and immunogenetics)given Yes - EScientist, clinical (histocompatibility and immunogenetics)given  No - patient declined information    Current Medications (verified) Outpatient Encounter Medications as of 06/26/2022  Medication Sig   amLODipine (NORVASC) 10 MG tablet TAKE 1 TABLET BY MOUTH EVERY DAY   aspirin EC 81 MG tablet Take 81 mg by mouth daily.   atorvastatin (LIPITOR) 80 MG tablet TAKE 1 TABLET BY MOUTH EVERY DAY   buPROPion (WELLBUTRIN XL) 150 MG 24 hr tablet Take 150 mg by mouth daily.   carvedilol (COREG) 25 MG tablet TAKE 2 TABLETS BY MOUTH 2 TIMES DAILY. CALL FOR APPOINTMENT   cephALEXin (KEFLEX) 500 MG capsule Take 1 capsule (500 mg total) by mouth 2 (two) times daily.   Cholecalciferol (VITAMIN D3 PO)  Take by mouth daily.   diphenhydrAMINE (BENADRYL) 25 MG tablet Take 25 mg by mouth every 6 (six) hours as needed.   empagliflozin (JARDIANCE) 25 MG TABS tablet Take 25 mg by mouth daily.    fluticasone (FLONASE) 50 MCG/ACT nasal spray Place 2 sprays into both nostrils daily as needed for allergies or rhinitis. Reported on 04/26/2016   hydrochlorothiazide (HYDRODIURIL) 25 MG tablet Take 1 tablet (25 mg total) by mouth daily.   Insulin Disposable Pump (OMNIPOD DASH 5 PACK PODS) MISC    meclizine (ANTIVERT) 25 MG tablet Take 1 tablet (25 mg total) by mouth 3 (three) times daily as needed for dizziness.   minoxidil (LONITEN) 10 MG tablet TAKE 1/2 TABLET BY MOUTH EVERY DAY   NOVOLOG 100 UNIT/ML injection    omeprazole (PRILOSEC) 40 MG capsule TAKE 1 CAPSULE BY MOUTH EVERY DAY   spironolactone (ALDACTONE) 50 MG tablet TAKE 1 TABLET (50 MG TOTAL) BY MOUTH DAILY. SCHEDULE OFFICE VISIT FOR FUTURE REFILLS.   valsartan (DIOVAN) 320 MG tablet TAKE 1 TABLET BY MOUTH EVERY DAY   [DISCONTINUED] cloNIDine (CATAPRES - DOSED IN MG/24 HR) 0.2 mg/24hr patch UNWRAP AND APPLY 1 PATCH TO SKIN WEEKLY   No facility-administered encounter medications on file as of 06/26/2022.    Allergies (verified) Bactrim [sulfamethoxazole-trimethoprim], Benazepril, Metformin and related, and Penicillins   History: Past Medical History:  Diagnosis Date   Allergy    Cancer ([place])    skin, hand   Cataract    history of surgery bilaterally   Hypercholesteremia    Hypertension    ICAO (internal carotid artery occlusion)    Spleen absent    patient states had spleen removed in high school   Stroke [facility]) 02/2016   Past Surgical History:  Procedure Laterality Date   EYE SURGERY     HERNIA REPAIR  02/28/2018   HERNIA REPAIR Bilateral 11/2019   PROSTATE SURGERY     SPLENECTOMY     Family History  Problem Relation Age of Onset   Breast cancer Mother    Diabetes Mellitus II Father    Breast cancer Sister    Sleep apnea Neg  Hx    Social History   Socioeconomic History   Marital status: Married    Spouse name: graye   Number of children: Not on file   Years of education: Not on file   Highest education level: Not on file  Occupational History   Not on file  Tobacco Use   Smoking status: Former    Packs/day: 1.00    Years: 20.00    Total pack years: 20.00    Types: Cigarettes   Smokeless tobacco: Never   Tobacco comments:    patient states last cigarette 35 years ago  Substance and Sexual Activity   Alcohol use: No   Drug use: No   Sexual activity: Not Currently  Other Topics Concern   Not on file  Social History Narrative   Lives with wife   Right Handed   Drinks 4-5 cups caffeine daily   Social Determinants of Health   Financial Resource Strain: Low Risk  (08/29/2020)   Overall Financial Resource Strain (CARDIA)    Difficulty of Paying Living Expenses: Not hard at all  Food Insecurity: No Food Insecurity (08/29/2020)   Hunger Vital Sign    Worried About Running Out of Food in the Last Year: Never true    Ran Out of Food in the Last Year: Never true  Transportation Needs: No Transportation Needs (08/29/2020)   PRAPARE - Hydrologist (Medical): No    Lack of Transportation (Non-Medical): No  Physical Activity: Not on file  Stress: No Stress Concern Present (08/29/2020)   [place]    Feeling of Stress : Not at all  Social Connections: Moderately Integrated (08/29/2020)   Social Connection and Isolation Panel [NHANES]    Frequency of Communication with Friends and Family: Twice a week    Frequency of Social Gatherings with Friends and Family: Twice a week    Attends Religious Services: More than 4 times per year    Active Member of [employer] or Organizations: No    Attends Music therapist: Never    Marital Status: Married    Tobacco Counseling Counseling given: Not  Answered Tobacco comments: patient states last cigarette 35 years ago   Clinical Intake:  Pre-visit preparation completed: Yes Pain : No/denies pain   BMI - recorded: 29.29 Nutritional Status: BMI 25 -29 Overweight Nutritional Risks: None Diabetes: Yes (most recent A1C 8.6) CBG done?: No How often do you need to have someone help you when you read instructions, pamphlets, or other written materials from your doctor or pharmacy?: 1 - Never Interpreter Needed?: No    Activities of Daily Living    07/02/2022  4:11 PM  In your present state of health, do you have any difficulty performing the following activities:  Hearing? 0  Vision? 0  Difficulty concentrating or making decisions? 0  Walking or climbing stairs? 0  Dressing or bathing? 0  Doing errands, shopping? 0  Preparing Food and eating ? N  Using the Toilet? N  In the past six months, have you accidently leaked urine? N  Do you have problems with loss of bowel control? N  Managing your Medications? N  Managing your Finances? N  Housekeeping or managing your Housekeeping? N    Patient Care Team: Lillard Anes, MD as PCP - General (Family Medicine) Croitoru, Dani Gobble, MD as PCP - Cardiology (Cardiology)  Indicate any recent Medical Services you may have received from other than [facility] providers in the past year (date may be approximate).     Assessment:   This is a routine wellness examination for [place].  Hearing/Vision screen No results found.  Dietary issues and exercise activities discussed: Current Exercise Habits: The patient does not participate in regular exercise at present, Exercise limited by: None identified   Goals Addressed   None    Depression Screen    07/02/2022    4:10 PM 03/21/2021   10:17 AM 08/29/2020   11:16 AM 03/10/2020    9:20 AM 11/13/2016    2:56 PM 06/18/2016    2:16 PM 06/06/2016   10:47 AM  PHQ 2/9 Scores  PHQ - 2 Score 0 2 1 0 '4 6 4  '$ PHQ- 9 Score '1 3   11 14 13     '$ Fall Risk    07/02/2022    4:10 PM 01/03/2022    9:16 AM 08/29/2020   11:21 AM 06/24/2020   11:35 AM 03/28/2020    3:08 PM  Fall Risk   Falls in the past year? 0 0 1 1 0  Number falls in past yr: 0 0 0 1 0  Injury with Fall? 0 0 0 0 0  Risk for fall due to : No Fall Risks History of fall(s) No Fall Risks History of fall(s);Impaired balance/gait   Follow up Falls evaluation completed;Falls prevention discussed Falls prevention discussed  Falls evaluation completed Falls evaluation completed    FALL RISK PREVENTION PERTAINING TO THE HOME:  Any stairs in or around the home? No  If so, are there any without handrails? No  Home free of loose throw rugs in walkways, pet beds, electrical cords, etc? Yes  Adequate lighting in your home to reduce risk of falls? Yes   ASSISTIVE DEVICES UTILIZED TO PREVENT FALLS:  Use of a cane, walker or w/c? No  Grab bars in the bathroom? No  Shower chair or bench in shower? No  Elevated toilet seat or a handicapped toilet? No   Cognitive Function:    10/25/2017    8:46 AM 04/26/2016    1:02 PM  MMSE - Mini Mental State Exam  Orientation to time 5 5  Orientation to Place 5 5  Registration 3 3  Attention/ Calculation 4 5  Recall 2 3  Language- name 2 objects 2 2  Language- repeat 0 1  Language- follow 3 step command 3 3  Language- read & follow direction 1 1  Write a sentence 1 1  Copy design 1 1  Total score 27 30        07/02/2022    4:12 PM 08/29/2020   11:19 AM  6CIT Screen  What Year? 0  points 0 points  What month? 0 points 0 points  What time? 0 points 0 points  Count back from 20 0 points 0 points  Months in reverse 0 points 0 points  Repeat phrase 0 points 0 points  Total Score 0 points 0 points    Immunizations Immunization History  Administered Date(s) Administered   Fluad Quad(high Dose 65+) 06/24/2020, 07/24/2021   Influenza, High Dose Seasonal PF 07/05/2016, 12/18/2017, 08/20/2018   Influenza-Unspecified 07/02/2019    Moderna Sars-Covid-2 Vaccination 11/20/2019, 12/16/2019, 09/23/2020   Pneumococcal Conjugate-13 08/01/2015   Pneumococcal Polysaccharide-23 10/08/1997, 09/16/2020    TDAP status: Due, Education has been provided regarding the importance of this vaccine. Advised may receive this vaccine at local pharmacy or Health Dept. Aware to provide a copy of the vaccination record if obtained from local pharmacy or Health Dept. Verbalized acceptance and understanding.  Flu Vaccine status: Due, Education has been provided regarding the importance of this vaccine. Advised may receive this vaccine at local pharmacy or Health Dept. Aware to provide a copy of the vaccination record if obtained from local pharmacy or Health Dept. Verbalized acceptance and understanding.  Pneumococcal vaccine status: Up to date  Covid-19 vaccine status: Information provided on how to obtain vaccines.   Qualifies for Shingles Vaccine? Yes   Zostavax completed No   Shingrix Completed?: No.    Education has been provided regarding the importance of this vaccine. Patient has been advised to call insurance company to determine out of pocket expense if they have not yet received this vaccine. Advised may also receive vaccine at local pharmacy or Health Dept. Verbalized acceptance and understanding.  Screening Tests Health Maintenance  Topic Date Due   Hepatitis C Screening  Never done   TETANUS/TDAP  Never done   Zoster Vaccines- Shingrix (1 of 2) Never done   COVID-19 Vaccine (4 - Moderna series) 11/18/2020   HEMOGLOBIN A1C  09/20/2021   Diabetic kidney evaluation - GFR measurement  03/21/2022   Diabetic kidney evaluation - Urine ACR  03/21/2022   FOOT EXAM  03/21/2022   INFLUENZA VACCINE  05/08/2022   COLONOSCOPY (Pts 45-19yr Insurance coverage will need to be confirmed)  08/13/2022   OPHTHALMOLOGY EXAM  06/15/2023   Pneumonia Vaccine 76 Years old  Completed   HPV VACCINES  Aged Out    Health Maintenance  Health  Maintenance Due  Topic Date Due   Hepatitis C Screening  Never done   TETANUS/TDAP  Never done   Zoster Vaccines- Shingrix (1 of 2) Never done   COVID-19 Vaccine (4 - Moderna series) 11/18/2020   HEMOGLOBIN A1C  09/20/2021   Diabetic kidney evaluation - GFR measurement  03/21/2022   Diabetic kidney evaluation - Urine ACR  03/21/2022   FOOT EXAM  03/21/2022   INFLUENZA VACCINE  05/08/2022    Colorectal cancer screening: Type of screening: Colonoscopy. Completed 08/2017.   Lung Cancer Screening: (Low Dose CT Chest recommended if Age 76-80years, 30 pack-year currently smoking OR have quit w/in 15years.) does not qualify.   Additional Screening:  Vision Screening: Recommended annual ophthalmology exams for early detection of glaucoma and other disorders of the eye. Is the patient up to date with their annual eye exam?  Yes   Dental Screening: Recommended annual dental exams for proper oral hygiene  Community Resource Referral / Chronic Care Management: CRR required this visit?  No   CCM required this visit?  No      Plan:    1- Come by  office to get flu shot 2- Get tetanus and Shingrix at pharmacy  I have personally reviewed and noted the following in the patient's chart:   Medical and social history Use of alcohol, tobacco or illicit drugs  Current medications and supplements including opioid prescriptions.  Functional ability and status Nutritional status Physical activity Advanced directives List of other physicians Hospitalizations, surgeries, and ER visits in previous 12 months Vitals Screenings to include cognitive, depression, and falls Referrals and appointments  In addition, I have reviewed and discussed with patient certain preventive protocols, quality metrics, and best practice recommendations. A written personalized care plan for preventive services as well as general preventive health recommendations were provided to patient.     Erie Noe,  LPN   04/28/5871

## 2022-07-03 ENCOUNTER — Ambulatory Visit: Payer: HMO | Admitting: Adult Health

## 2022-07-05 NOTE — Progress Notes (Signed)
ANNUAL DIABETIC FOOT EXAM  Subjective: Mark Stewart presents today for annual diabetic foot examination.  Patient confirms h/o diabetes.  Patient relates 26 year h/o diabetes.  Patient denies any h/o foot wounds.  Patient has cramping of left foot and states it feels numb.  Patient's blood sugar was 250 mg/dl today. Last known  HgA1c was 8.4%   Risk factors: uncontrolled diabetes, diabetic neuropathy, h/o CVA, HTN, hypercholesterolemia, h/o tobacco use in remission, h/o splenectomy .  Lillard Anes, MD is patient's PCP. Last visit was May 30, 2022.  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 08/30/2021   Diabetic retinopathy ([place]) 03/21/2021   Mixed hearing loss, bilateral 03/21/2021   Senile purpura ([place]) 03/21/2021   Cerebral atherosclerosis 02/16/2021   Atherosclerosis of renal artery ([place]) 08/29/2020   ED (erectile dysfunction) 08/29/2020   Routine general medical examination at a health care facility 08/29/2020   Long-term insulin use ([place]) 08/15/2020   Lumbar disc disease with radiculopathy 06/24/2020   Carotid stenosis 06/14/2020   Close exposure to COVID-19 virus 06/05/2020   Ataxia 05/08/2020   Diabetic polyneuropathy ([place]) 04/05/2020   BMI 28.0-28.9,adult 03/10/2020   GERD (gastroesophageal reflux disease) 02/23/2020   Obstructive chronic bronchitis without exacerbation ([place]) 02/23/2020   Low back pain 02/23/2020   Breast mass in male 02/23/2020    Hemiparesis affecting left side as late effect of stroke ([place]) 10/18/2018   Obstructive sleep apnea 04/24/2018   Somnolence, daytime 10/25/2017   Left hemiparesis ([place]) 07/19/2017   Diabetes mellitus with hyperglycemia, with long-term current use of insulin ([place]) 02/28/2016   Hypercholesterolemia 02/27/2016   Essential hypertension 02/27/2016   Cerebral infarction due to cerebral artery occlusion (HCC)    Poorly controlled diabetes mellitus ([place])    Acute CVA (cerebrovascular accident) ([place]) 02/26/2016   Past Surgical History:  Procedure Laterality Date   EYE SURGERY     HERNIA REPAIR  02/28/2018   HERNIA REPAIR Bilateral 11/2019   PROSTATE SURGERY     SPLENECTOMY     Current Outpatient Medications on File Prior to Visit  Medication Sig Dispense Refill   amLODipine (NORVASC) 10 MG tablet TAKE 1 TABLET BY MOUTH EVERY DAY 90 tablet 2   aspirin EC 81 MG tablet Take 81 mg by mouth daily.     atorvastatin (LIPITOR) 80 MG tablet TAKE 1 TABLET BY MOUTH EVERY DAY 90 tablet 1   buPROPion (WELLBUTRIN XL) 150 MG 24 hr tablet Take 150 mg by mouth daily.     carvedilol (COREG) 25 MG tablet TAKE 2 TABLETS BY MOUTH 2 TIMES DAILY. CALL FOR APPOINTMENT 120 tablet 0   cephALEXin (KEFLEX) 500 MG capsule Take 1 capsule (500 mg total) by mouth 2 (two) times daily. 20 capsule 0   Cholecalciferol (VITAMIN D3 PO) Take by mouth daily.     cloNIDine (CATAPRES - DOSED IN MG/24 HR) 0.2 mg/24hr patch UNWRAP AND APPLY 1 PATCH TO SKIN WEEKLY  4 patch 1   diphenhydrAMINE (BENADRYL) 25 MG tablet Take 25 mg by mouth every 6 (six) hours as needed.     empagliflozin (JARDIANCE) 25 MG TABS tablet Take 25 mg by mouth daily.      fluticasone (FLONASE) 50 MCG/ACT nasal spray Place 2 sprays into both nostrils daily as needed for allergies or rhinitis. Reported on 04/26/2016 18.2 mL 2   hydrochlorothiazide (HYDRODIURIL) 25 MG tablet Take 1 tablet (25 mg total) by mouth daily. 90 tablet 2   Insulin Disposable Pump (OMNIPOD DASH  5 PACK PODS) MISC      meclizine (ANTIVERT) 25 MG tablet Take 1 tablet (25 mg total) by mouth 3 (three) times daily as needed for dizziness. 30 tablet 0   minoxidil (LONITEN) 10 MG tablet TAKE 1/2 TABLET BY MOUTH EVERY DAY 45 tablet 2   NOVOLOG 100 UNIT/ML injection      omeprazole (PRILOSEC) 40 MG capsule TAKE 1 CAPSULE BY MOUTH EVERY DAY 90 capsule 2   spironolactone (ALDACTONE) 50 MG tablet TAKE 1 TABLET (50 MG TOTAL) BY MOUTH DAILY. SCHEDULE OFFICE VISIT FOR FUTURE REFILLS. 30 tablet 0   valsartan (DIOVAN) 320 MG tablet TAKE 1 TABLET BY MOUTH EVERY DAY 90 tablet 2   No current facility-administered medications on file prior to visit.    Allergies  Allergen Reactions   Bactrim [Sulfamethoxazole-Trimethoprim] Hives   Benazepril Cough   Metformin And Related Rash   Penicillins Rash    Has patient had a PCN reaction causing immediate rash, facial/tongue/throat swelling, SOB or lightheadedness with hypotension: yes Has patient had a PCN reaction causing severe rash involving mucus membranes or skin necrosis: no Has patient had a PCN reaction that required hospitalization: no Has patient had a PCN reaction occurring within the last 10 years: no If all of the above answers are "NO", then may proceed with Cephalosporin use.    Social History   Occupational History   Not on file  Tobacco Use   Smoking status: Former    Packs/day: 1.00    Years: 20.00    Total pack years: 20.00    Types: Cigarettes   Smokeless tobacco: Never   Tobacco comments:    patient states last cigarette 35 years ago  Substance and Sexual Activity   Alcohol use: No   Drug use: No   Sexual activity: Not Currently   Family History  Problem Relation Age of Onset   Breast cancer Mother    Diabetes Mellitus II Father    Breast cancer Sister    Sleep apnea Neg Hx    Immunization History  Administered Date(s) Administered   Fluad Quad(high Dose 65+) 06/24/2020, 07/24/2021   Influenza, High Dose Seasonal PF  07/05/2016, 12/18/2017, 08/20/2018   Influenza-Unspecified 07/02/2019   Moderna Sars-Covid-2 Vaccination 11/20/2019, 12/16/2019, 09/23/2020   Pneumococcal Conjugate-13 08/01/2015   Pneumococcal Polysaccharide-23 10/08/1997, 09/16/2020     Review of Systems: Negative except as noted in the HPI.   Objective: There were no vitals filed for this visit.  Favio Moder Paiz is a pleasant 76 y.o. male in NAD. AAO X 3.  Objective:   Vascular Examination: Vascular status intact b/l with palpable pedal pulses. Pedal hair absent b/l. CFT immediate b/l. No pain with calf compression b/l. Skin temperature gradient WNL b/l. Trace edema noted BLE.  Neurological Examination: Pt has subjective symptoms of neuropathy. Protective sensation decreased with 10 gram monofilament b/l. Proprioception intact bilaterally.  Dermatological Examination: Pedal skin with normal turgor, texture and tone b/l.  Toenails 1-5 b/l thick, discolored, elongated with subungual debris and pain on dorsal palpation. No hyperkeratotic lesions noted b/l. No open wounds b/l LE. No interdigital macerations noted b/l LE. Incurvated nailplate medial border left 2nd toe.  Nail border hypertrophy absent. There is tenderness to palpation. Sign(s) of infection: no clinical signs of infection noted on examination today..  Musculoskeletal Examination: Normal muscle strength 5/5 to all lower extremity muscle groups bilaterally. Hammertoe deformity noted 2-5 b/l. Pes planus deformity noted bilateral LE.Marland Kitchen No pain, crepitus or joint limitation noted with ROM b/l LE.  Patient ambulates independently without assistive aids.  Radiographs: None  Footwear Assessment: Does the patient wear appropriate shoes? Yes. Does the patient need inserts/orthotics? Yes.  ADA Risk Categorization: High Risk  Patient has one or more of the following: Loss of protective sensation Absent pedal pulses Severe Foot deformity History of foot ulcer  Assessment: 1.  Pain due to onychomycosis of toenails of both feet   2. Hammer toes of both feet   3. Pes planus of both feet   4. Diabetic polyneuropathy associated with type 2 diabetes mellitus ([place])   5. Encounter for diabetic foot exam [facility])     Plan: -Patient was evaluated and treated. All patient's and/or POA's questions/concerns answered on today's visit. -Diabetic foot examination performed today. -Stressed the importance of good glycemic control and the detriment of not  controlling glucose levels in relation to the foot. -Patient to continue soft, supportive shoe gear daily. -Toenails 1-5 b/l were debrided in length and girth with sterile nail nippers and dremel without iatrogenic bleeding.  -Offending nail border debrided and curretaged L 2nd toe utilizing sterile nail nipper and currette. Border cleansed with alcohol and triple antibiotic applied. No further treatment required by patient/caregiver. Call office if there are any concerns. -Patient/POA to call should there be question/concern in the interim. Return in about 3 months (around 09/27/2022).  Marzetta Board, DPM

## 2022-07-07 NOTE — Telephone Encounter (Signed)
Previous paperwork was sent to Dr Henrene Pastor and was signed by Dr Tobie Poet. Order has been placed but at the [facility] expired 06/19/22. Will hold products until new forms come in.

## 2022-07-10 ENCOUNTER — Other Ambulatory Visit: Payer: Self-pay | Admitting: Cardiovascular Disease

## 2022-07-11 DIAGNOSIS — E118 Type 2 diabetes mellitus with unspecified complications: Secondary | ICD-10-CM | POA: Diagnosis not present

## 2022-07-12 ENCOUNTER — Telehealth: Payer: Self-pay | Admitting: Cardiovascular Disease

## 2022-07-12 ENCOUNTER — Other Ambulatory Visit: Payer: Self-pay

## 2022-07-12 MED ORDER — CARVEDILOL 25 MG PO TABS: 50.0000 mg | ORAL_TABLET | Freq: Two times a day (BID) | ORAL | 0 refills | Status: DC

## 2022-07-12 NOTE — Telephone Encounter (Signed)
Proper dosing sent in to the pts pharmacy and he will keep his 09/07/22 OV.

## 2022-07-12 NOTE — Telephone Encounter (Signed)
Pt c/o medication issue:  1. Name of Medication:   carvedilol (COREG) 25 MG tablet  2. How are you currently taking this medication (dosage and times per day)?   As prescribed  3. Are you having a reaction (difficulty breathing--STAT)?   4. What is your medication issue?   Patient stated he had been taking this medication 2 pills twice a day but wants to confirm his dosage has now changed to 1 pill twice a day.

## 2022-07-16 DIAGNOSIS — G4733 Obstructive sleep apnea (adult) (pediatric): Secondary | ICD-10-CM | POA: Diagnosis not present

## 2022-07-17 ENCOUNTER — Other Ambulatory Visit: Payer: Self-pay | Admitting: Legal Medicine

## 2022-07-27 ENCOUNTER — Telehealth: Payer: Self-pay | Admitting: Podiatry

## 2022-07-27 NOTE — Telephone Encounter (Signed)
Dr. Blanch Media office called, stated that he is not the provider treating pt's diabetes. Ppwk needs to be sent to his endocrinologist Dr. Peri Jefferson, phone number is [phone] / fax [phone].

## 2022-07-31 ENCOUNTER — Other Ambulatory Visit: Payer: Self-pay | Admitting: Cardiovascular Disease

## 2022-08-15 ENCOUNTER — Ambulatory Visit: Payer: HMO

## 2022-08-20 ENCOUNTER — Ambulatory Visit (INDEPENDENT_AMBULATORY_CARE_PROVIDER_SITE_OTHER): Payer: HMO

## 2022-08-20 DIAGNOSIS — Z23 Encounter for immunization: Secondary | ICD-10-CM | POA: Diagnosis not present

## 2022-08-20 DIAGNOSIS — S41112A Laceration without foreign body of left upper arm, initial encounter: Secondary | ICD-10-CM

## 2022-08-20 MED ORDER — DOXYCYCLINE HYCLATE 100 MG PO TABS: 100.0000 mg | ORAL_TABLET | Freq: Two times a day (BID) | ORAL | 0 refills | Status: DC

## 2022-08-20 NOTE — Patient Instructions (Addendum)
Continue antibiotic ointment to left arm lacerations Cover with non-adherent bandage while healing Follow-up as needed   Laceration Care, Adult A laceration is a cut that may go through all layers of the skin. The cut may also go into the tissue that is right under the skin. Some cuts heal on their own. Other cuts need to be closed with stitches (sutures), staples, skin adhesive strips, or skin glue. Taking care of your cut lowers your risk of infection, helps your injury heal better, and may prevent scarring. General tips Keep your wound clean and dry. Do not scratch or pick at your wound. Wash your hands with soap and water for at least 20 seconds before and after touching your wound or changing your bandage (dressing). If you cannot use soap and water, use hand sanitizer. Do not usedisinfectants or antiseptics, such as rubbing alcohol, to clean your wound unless told by your doctor. If you were given a bandage, change it at least once a day, or as told by your doctor. You should also change it if it gets wet or dirty. How to take care of your cut If your doctor used stitches or staples: Keep the wound fully dry for the first 24 hours, or as told by your doctor. After that, you may take a shower or a bath. Do not soak the wound in water until after the stitches or staples have been taken out. Clean the wound once a day, or as told by your doctor. To do this: Wash the wound with soap and water. Rinse the wound with water to remove all soap. Pat the wound dry with a clean towel. Do not rub the wound. After you clean the wound, put a thin layer of antibiotic ointment, another ointment, or a nonstick bandage on it as told by your doctor. This will help to: Prevent infection. Keep the bandage from sticking to the wound. Have your stitches or staples taken out as told by your doctor. If your doctor used skin adhesive strips: Do not get the skin adhesive strips wet. You can take a shower or a  bath, but keep the wound dry. If the wound gets wet, pat it dry with a clean towel. Do not rub the wound. Skin adhesive strips fall off on their own. You can trim the strips as the wound heals. Do not take off any strips that are still stuck to the wound unless told by your doctor. The strips will fall off after a while. If your doctor used skin glue: You may take a shower or a bath, but try to keep the wound dry. Do not soak the wound in water. After you take a shower or a bath, pat the wound dry with a clean towel. Do not rub the wound. Do not do any activities that will make you sweat a lot until the skin glue has fallen off. Do not apply liquid, cream, or ointment medicine to your wound while the skin glue is still on. If a bandage is placed over the wound, do not put tape right on top of the skin glue. Do not pick at the glue. The skin glue usually stays on for 5-10 days. Then, it falls off the skin. Follow these instructions at home: Medicines Take over-the-counter and prescription medicines only as told by your doctor. If you were prescribed an antibiotic medicine, take or apply it as told by your doctor. Do not stop using it even if you start to feel better. Managing  pain and swelling If told, put ice on the injured area. To do this: Put ice in a plastic bag. Place a towel between your skin and the bag. Leave the ice on for 20 minutes, 2-3 times a day. Take off the ice if your skin turns bright red. This is very important. If you cannot feel pain, heat, or cold, you have a greater risk of damage to the area. Raise the injured area above the level of your heart while you are sitting or lying down. General instructions  Avoid any activity that could make your wound reopen. Check your wound every day for signs of infection. Check for: More redness, swelling, or pain. Fluid or blood. Warmth. Pus or a bad smell. Keep all follow-up visits. Contact a doctor if: You got a tetanus shot  and you have any of these problems where the needle went in: Swelling. Very bad pain. Redness. Bleeding. A wound that was closed breaks open. You have a fever. You have any of these signs of infection in your wound: More redness, swelling, or pain. Fluid or blood. Warmth. Pus or a bad smell. You see something coming out of the wound, such as wood or glass. Medicine does not make your pain go away. You notice a change in the color of your skin near your wound. You need to change the bandage often. You have a new rash. You lose feeling (have numbness) around the wound. Get help right away if: You have very bad swelling around the wound. Your pain suddenly gets worse and is very bad. You have painful lumps near the wound or on skin anywhere on your body. You have a red streak going away from your wound. The wound is on your hand or foot, and: You cannot move a finger or toe. Your fingers or toes look pale or bluish. Summary A laceration is a cut that may go through all layers of the skin. The cut may also go into the tissue right under the skin. Some cuts heal on their own. Others need to be closed with stitches, staples, skin adhesive strips, or skin glue. Follow your doctor's instructions for caring for your cut. Proper care of a cut lowers the risk of infection, helps the cut heal better, and may prevent scarring. This information is not intended to replace advice given to you by your health care provider. Make sure you discuss any questions you have with your health care provider. Document Revised: 12/01/2020 Document Reviewed: 12/01/2020 [employer] Patient Education  [employer].

## 2022-08-21 LAB — CBC WITH DIFFERENTIAL/PLATELET
Basophils Absolute: 0 10*3/uL (ref 0.0–0.2)
Basos: 1 %
EOS (ABSOLUTE): 0.5 10*3/uL — ABNORMAL HIGH (ref 0.0–0.4)
Eos: 6 %
Hematocrit: 45.9 % (ref 37.5–51.0)
Hemoglobin: 15.1 g/dL (ref 13.0–17.7)
Immature Grans (Abs): 0 10*3/uL (ref 0.0–0.1)
Immature Granulocytes: 0 %
Lymphocytes Absolute: 1.3 10*3/uL (ref 0.7–3.1)
Lymphs: 16 %
MCH: 31.6 pg (ref 26.6–33.0)
MCHC: 32.9 g/dL (ref 31.5–35.7)
MCV: 96 fL (ref 79–97)
Monocytes Absolute: 0.7 10*3/uL (ref 0.1–0.9)
Monocytes: 8 %
Neutrophils Absolute: 5.8 10*3/uL (ref 1.4–7.0)
Neutrophils: 69 %
Platelets: 349 10*3/uL (ref 150–450)
RBC: 4.78 x10E6/uL (ref 4.14–5.80)
RDW: 12.8 % (ref 11.6–15.4)
WBC: 8.3 10*3/uL (ref 3.4–10.8)

## 2022-08-21 LAB — COMPREHENSIVE METABOLIC PANEL
ALT: 11 IU/L (ref 0–44)
AST: 11 IU/L (ref 0–40)
Albumin/Globulin Ratio: 1.5 (ref 1.2–2.2)
Albumin: 3.9 g/dL (ref 3.8–4.8)
Alkaline Phosphatase: 85 IU/L (ref 44–121)
BUN/Creatinine Ratio: 21 (ref 10–24)
BUN: 26 mg/dL (ref 8–27)
Bilirubin Total: 0.4 mg/dL (ref 0.0–1.2)
CO2: 22 mmol/L (ref 20–29)
Calcium: 8.8 mg/dL (ref 8.6–10.2)
Chloride: 105 mmol/L (ref 96–106)
Creatinine, Ser: 1.26 mg/dL (ref 0.76–1.27)
Globulin, Total: 2.6 g/dL (ref 1.5–4.5)
Glucose: 298 mg/dL — ABNORMAL HIGH (ref 70–99)
Potassium: 4.7 mmol/L (ref 3.5–5.2)
Sodium: 139 mmol/L (ref 134–144)
Total Protein: 6.5 g/dL (ref 6.0–8.5)
eGFR: 59 mL/min/{1.73_m2} — ABNORMAL LOW (ref >59)

## 2022-08-22 ENCOUNTER — Ambulatory Visit (INDEPENDENT_AMBULATORY_CARE_PROVIDER_SITE_OTHER): Payer: HMO

## 2022-08-22 DIAGNOSIS — M2142 Flat foot [pes planus] (acquired), left foot: Secondary | ICD-10-CM

## 2022-08-22 DIAGNOSIS — M2141 Flat foot [pes planus] (acquired), right foot: Secondary | ICD-10-CM

## 2022-08-22 DIAGNOSIS — E1142 Type 2 diabetes mellitus with diabetic polyneuropathy: Secondary | ICD-10-CM

## 2022-08-22 NOTE — Progress Notes (Signed)
Patient presents to the office today with issues concerning the diabetic shoes picked up on 08/22/22.   The shoes feel too short.  We will send the OrthoFeet brand, style 672, size 12 D back.   Reorder: OrthoFeet 672 12.5 D  Patient kept insoles for other shoes. Advised to bring back a pair to check fit of reorder.  Patient will be notified for a fitting appointment once the shoes arrive in office.

## 2022-08-28 ENCOUNTER — Other Ambulatory Visit: Payer: Self-pay

## 2022-08-28 MED ORDER — ATORVASTATIN CALCIUM 80 MG PO TABS: 80.0000 mg | ORAL_TABLET | Freq: Every day | ORAL | 1 refills | Status: DC

## 2022-09-04 ENCOUNTER — Encounter: Payer: Self-pay | Admitting: Neurology

## 2022-09-04 ENCOUNTER — Ambulatory Visit: Payer: HMO | Admitting: Neurology

## 2022-09-04 VITALS — BP 138/74 | HR 66 | Ht 72.0 in | Wt 219.0 lb

## 2022-09-04 DIAGNOSIS — G4733 Obstructive sleep apnea (adult) (pediatric): Secondary | ICD-10-CM

## 2022-09-04 DIAGNOSIS — G4719 Other hypersomnia: Secondary | ICD-10-CM

## 2022-09-04 NOTE — Patient Instructions (Addendum)
It was nice to see you again today.  You are compliant with your CPAP, your apnea control is good. Please follow-up yearly in sleep clinic, you can see Janett Billow next time.  For any neurological issues, please make an appointment with Dr. [name].  Please continue using your CPAP regularly. While your insurance requires that you use CPAP at least 4 hours each night on 70% of the nights, I recommend, that you not skip any nights and use it throughout the night if you can. Getting used to CPAP and staying with the treatment long term does take time and patience and discipline. Untreated obstructive sleep apnea when it is moderate to severe can have an adverse impact on cardiovascular health and raise her risk for heart disease, arrhythmias, hypertension, congestive heart failure, stroke and diabetes. Untreated obstructive sleep apnea causes sleep disruption, nonrestorative sleep, and sleep deprivation. This can have an impact on your day to day functioning and cause daytime sleepiness and impairment of cognitive function, memory loss, mood disturbance, and problems focussing. Using CPAP regularly can improve these symptoms.

## 2022-09-04 NOTE — Progress Notes (Signed)
Subjective:    Patient ID: Mark Stewart is a 76 y.o. male.  HPI    Interim history:   Mark Stewart is a 76 year old right-handed gentleman with an underlying medical history of hypertension, hyperlipidemia, status post splenectomy as a teenager, stroke in 2017, diabetes, cataracts, allergies, and overweight state, who presents for follow-up consultation of his obstructive sleep apnea on CPAP therapy. The patient is accompanied by his wife today. I last saw him on 04/26/2022 , at which time he reported that his AutoPap mask was uncomfortable.  He had stopped using his AutoPap machine about 3 months prior.  He was advised to proceed with a formal titration study.  He had a CPAP titration study on 05/28/2022.  His sleep efficiency was 72.1%, sleep latency 8.5 minutes, REM latency 64 minutes.  He had a mildly reduced percentage of REM sleep at 14.3%.  He was fitted with a small fullface mask from [employer].  CPAP was titrated from 5 cm to a final pressure of 9 cm at which time his AHI was 0/h with nonsupine REM sleep achieved, O2 nadir was 92%.  Based on his test results I prescribed home CPAP therapy at a pressure of 9 cm.  Today, 09/04/2022: I reviewed his CPAP compliance data from 07/15/2022 through 08/08/2022, which is a total of 25 days, during which time he used his machine every night with percent use days greater than 4 hours at 88%, indicating very good compliance, average usage of 6 hours and 18 minutes, residual AHI at goal at 2.6/h, leak acceptable with the 95th percentile at 10.4 L/min on a pressure of 9 cm with EPR of 3.  In the past 30 days he used his machine 25 out of 30 days with percent use days greater than 4 hours at 73%.  He reports having adjusted well to CPAP therapy.  He is now using the Evora fullface mask from [employer] and the seal is better.  He is motivated to continue with treatment, still has sleepiness during the day.  He does not drive very much, did not drive today.   He is advised not to drive with feeling this sleepy.  His Epworth sleepiness score is 24 out of 24.  He has caffeine in the form of soda and tea, about 4 servings per day, very little water, not quite 16 ounces per day, may be even less than 8 ounces per day per wife's estimate.   Previously (copied from previous notes for reference):    I saw him on 07/25/2020, at which time we talked about secondary stroke prevention.  He was advised to follow-up with the stroke specialist on a regular basis.  He was advised to proceed with reevaluation of his obstructive sleep apnea.  He had an interim home sleep test on 08/17/2020 which indicated severe obstructive sleep apnea with an AHI of 51.7/h, O2 nadir 86%. He saw Mark Rider, NP on 03/21/2022, at which time he was not compliant with his AutoPap.  He reported having dizziness from it.   He saw Mark Stewart on 07/04/2021, at which time he was compliant with his AutoPap machine.   He saw Mark Rider, NP on 12/26/2020, at which time he was compliant with his AutoPap and reported improvement in his daytime somnolence.   He saw Dr. Leonie Stewart on 09/06/2020, at which time medical management for right carotid artery stenosis was recommended.   07/25/20: (He) presents for Evaluation of his brain atherosclerotic disease and history of stroke.  The  patient is accompanied by his wife today and is referred by Dr. Kathyrn Stewart.  Reviewed his office note from 06/16/2020.  He has had intermittent dizziness.  He has not had any recent falls but did fall backwards in the garage when he turned.  He did not hurt himself thankfully.  This was a few months ago.  His wife reports that sometimes he is not accurate with his medication intake as far as timing goes but he reports that he takes all his medications on a regular basis with the rare exception of forgetting medication.  He has not had any sudden onset of one-sided weakness or numbness or tingling or droopy face or slurring of  speech recently.  He was supposed to see a vascular specialist but was told that he did not need any carotid artery surgery as I understand.    At the time of his appointment with Dr. Kathyrn Stewart on 06/16/2020 his blood pressure was notably high, 197/105 and he was advised to go to the emergency room after the appointment.  His wife reports that they did go to the emergency room and were there for several hours.  I reviewed the emergency room records from 06/16/2020.  His blood pressure in the ER was 159/123.  He was not given any new medications in the ER.  He was advised to follow-up with his primary care physician for blood pressure management.   Evaluation was requested by Dr. Kathyrn Stewart with stroke specialist, Dr. Willaim Stewart.  The patient was under the impression that he would meet with the stroke specialist today.  He has not been seen in stroke clinic since Mark Stewart, nurse practitioner retired.   I have followed him for sleep apnea.  He was previously followed by the stroke team, particularly Mark Stewart, nurse practitioner and Dr. Erlinda Stewart.    I last saw him on 08/10/2019 for reevaluation of his sleep apnea.  He reported feeling sleepy during the day.  He had not used his CPAP.   He has not used his machine for nearly 2 years.  He was willing to proceed with a sleep study but wanted to wait after the first of the year.      He had a CT angiogram on 05/26/2020 of the head and neck and was found to have abnormal appearance of the right ICA with severe stenosis of the right carotid siphon, 70% or greater.  Mild atherosclerotic disease of the left carotid siphon without significant stenosis.  Atherosclerotic disease at both carotid bifurcations but without stenosis, aortic atherosclerosis.  30% stenosis of the proximal left subclavian artery.  50% stenosis of the left vertebral artery origin.     The patient's allergies, current medications, family history, past medical history, past social history, past  surgical history and problem list were reviewed and updated as appropriate.      I saw him on 05/29/2018, at which time he was compliant with CPAP but had trouble tolerating it.       I first met him at the request of Dr. Erlinda Stewart and Mark Rubin, NP, on 12/05/2017 at which time he reported snoring and daytime somnolence. His Epworth sleepiness score was 23 at the time. He was advised to proceed with sleep study testing. He had a baseline sleep study, followed by a CPAP titration study. I went over his test results with him in detail today. His baseline sleep study from 01/15/2018 showed a sleep efficiency of 74.1%, sleep latency 92 minutes, REM latency 87.5 minutes. He  had an increased percentage of stage II sleep, REM sleep was mildly reduced at 16.9%, total AHI was 11.1 per hour, REM AHI 9.2 per hour, supine sleep was not achieved. Average oxygen saturation was 94%, nadir was 86%. Based on his medical history and sleep related complaints he was advised to pursue CPAP therapy. He had a titration study on 02/26/2018. Sleep efficiency was 80.2%, sleep latency 11 minutes, REM latency 67.5 minutes. He was titrated via full facemask from CPAP of 5 cm to 15 cm. On the final pressure his AHI was 0 per hour with supine REM sleep achieved an O2 nadir of 91%. He had mild PLMS with minimal arousals during that study. Based on his test results I prescribed CPAP therapy for home use at a pressure of 15 cm.   I reviewed his CPAP compliance data from 04/28/2018 through 05/27/2018 which is a total of 30 days, during which time he used his CPAP every night with percent used days greater than 4 hours at 70%, indicating adequate compliance with an average usage of 5 hours and 21 minutes, residual AHI at goal at 2.1 per hour, leak on the high side with the 95th percentile at 38.8 L/m on a pressure of 15 cm with EPR of 3.    12/05/2017: (He) reports snoring and excessive daytime somnolence. I reviewed your office note from  10/25/2017. His Epworth sleepiness score is 23 out of 24 today, fatigue score is 41 out of 63. He quit smoking, he does not utilize alcohol currently, drinks caffeine in the form of coffee, typically 1 cup per day and 3 cups of tea per day on average. He does not have that scheduled for his bedtime and rise time. Per wife, he tends to sit in the recliner in the dependent watch TV but he dozes off frequently. He may not come to bed until 1 or 2 AM and sometimes as late as 3 AM, his rise time is around 7 but per wife, he may be in bed until 10 unless she makes him get out of bed. He does not have a formal family history of sleep apnea but suspects that his father could've had it. He had a tonsillectomy as a child. He quit smoking over 30 years ago. He has 20 more is and 4 grandchildren, is retired as an Optometrist. His wife also recently retired. His weight has been stable. He has nocturia about once or twice per night, denies morning headaches. He denies telltale symptoms of restless leg syndrome. Sometimes he mumbles in his sleep and says a few words per wife's report.   His Past Medical History Is Significant For: Past Medical History:  Diagnosis Date   Allergy    Cancer ([place])    skin, hand   Cataract    history of surgery bilaterally   Hypercholesteremia    Hypertension    ICAO (internal carotid artery occlusion)    Spleen absent    patient states had spleen removed in high school   Stroke [facility]) 02/2016    His Past Surgical History Is Significant For: Past Surgical History:  Procedure Laterality Date   EYE SURGERY     HERNIA REPAIR  02/28/2018   HERNIA REPAIR Bilateral 11/2019   PROSTATE SURGERY     SPLENECTOMY      His Family History Is Significant For: Family History  Problem Relation Age of Onset   Breast cancer Mother    Diabetes Mellitus II Father    Breast cancer  Sister    Sleep apnea Neg Hx     His Social History Is Significant For: Social History   Socioeconomic  History   Marital status: Married    Spouse name: graye   Number of children: Not on file   Years of education: Not on file   Highest education level: Not on file  Occupational History   Not on file  Tobacco Use   Smoking status: Former    Packs/day: 1.00    Years: 20.00    Total pack years: 20.00    Types: Cigarettes   Smokeless tobacco: Never   Tobacco comments:    patient states last cigarette 35 years ago  Substance and Sexual Activity   Alcohol use: No   Drug use: No   Sexual activity: Not Currently  Other Topics Concern   Not on file  Social History Narrative   Lives with wife   Right Handed   Drinks 4-5 cups caffeine daily   Social Determinants of Health   Financial Resource Strain: Low Risk  (08/29/2020)   Overall Financial Resource Strain (CARDIA)    Difficulty of Paying Living Expenses: Not hard at all  Food Insecurity: No Food Insecurity (08/29/2020)   Hunger Vital Sign    Worried About Running Out of Food in the Last Year: Never true    Ran Out of Food in the Last Year: Never true  Transportation Needs: No Transportation Needs (08/29/2020)   PRAPARE - Hydrologist (Medical): No    Lack of Transportation (Non-Medical): No  Physical Activity: Not on file  Stress: No Stress Concern Present (08/29/2020)   [place]    Feeling of Stress : Not at all  Social Connections: Moderately Integrated (08/29/2020)   Social Connection and Isolation Panel [NHANES]    Frequency of Communication with Friends and Family: Twice a week    Frequency of Social Gatherings with Friends and Family: Twice a week    Attends Religious Services: More than 4 times per year    Active Member of [employer] or Organizations: No    Attends Music therapist: Never    Marital Status: Married    His Allergies Are:  Allergies  Allergen Reactions   Bactrim [Sulfamethoxazole-Trimethoprim]  Hives   Benazepril Cough   Metformin And Related Rash   Penicillins Rash    Has patient had a PCN reaction causing immediate rash, facial/tongue/throat swelling, SOB or lightheadedness with hypotension: yes Has patient had a PCN reaction causing severe rash involving mucus membranes or skin necrosis: no Has patient had a PCN reaction that required hospitalization: no Has patient had a PCN reaction occurring within the last 10 years: no If all of the above answers are "NO", then may proceed with Cephalosporin use.   :   His Current Medications Are:  Outpatient Encounter Medications as of 09/04/2022  Medication Sig   amLODipine (NORVASC) 10 MG tablet TAKE 1 TABLET BY MOUTH EVERY DAY   aspirin EC 81 MG tablet Take 81 mg by mouth daily.   atorvastatin (LIPITOR) 80 MG tablet Take 1 tablet (80 mg total) by mouth daily.   buPROPion (WELLBUTRIN XL) 150 MG 24 hr tablet Take 150 mg by mouth daily.   carvedilol (COREG) 25 MG tablet Take 2 tablets (50 mg total) by mouth 2 (two) times daily with a meal. Keep 09/07/22 appt   Cholecalciferol (VITAMIN D3 PO) Take by mouth daily.  cloNIDine (CATAPRES - DOSED IN MG/24 HR) 0.2 mg/24hr patch UNWRAP AND APPLY 1 PATCH TO SKIN WEEKLY   diphenhydrAMINE (BENADRYL) 25 MG tablet Take 25 mg by mouth every 6 (six) hours as needed.   empagliflozin (JARDIANCE) 25 MG TABS tablet Take 25 mg by mouth daily.    fluticasone (FLONASE) 50 MCG/ACT nasal spray Place 2 sprays into both nostrils daily as needed for allergies or rhinitis. Reported on 04/26/2016   hydrochlorothiazide (HYDRODIURIL) 25 MG tablet Take 1 tablet (25 mg total) by mouth daily.   Insulin Disposable Pump (OMNIPOD DASH 5 PACK PODS) MISC    minoxidil (LONITEN) 10 MG tablet TAKE 1/2 TABLET BY MOUTH EVERY DAY   NOVOLOG 100 UNIT/ML injection    omeprazole (PRILOSEC) 40 MG capsule TAKE 1 CAPSULE BY MOUTH EVERY DAY   spironolactone (ALDACTONE) 50 MG tablet TAKE 1 TABLET (50 MG TOTAL) BY MOUTH DAILY. SCHEDULE  OFFICE VISIT FOR FUTURE REFILLS.   valsartan (DIOVAN) 320 MG tablet TAKE 1 TABLET BY MOUTH EVERY DAY   meclizine (ANTIVERT) 25 MG tablet Take 1 tablet (25 mg total) by mouth 3 (three) times daily as needed for dizziness. (Patient not taking: Reported on 09/04/2022)   [DISCONTINUED] cephALEXin (KEFLEX) 500 MG capsule Take 1 capsule (500 mg total) by mouth 2 (two) times daily. (Patient not taking: Reported on 09/04/2022)   [DISCONTINUED] doxycycline (VIBRA-TABS) 100 MG tablet Take 1 tablet (100 mg total) by mouth 2 (two) times daily. (Patient not taking: Reported on 09/04/2022)   No facility-administered encounter medications on file as of 09/04/2022.  :  Review of Systems:  Out of a complete 14 point review of systems, all are reviewed and negative with the exception of these symptoms as listed below:  Review of Systems  Neurological:        Just recently not being able to breath well via mask.   DME [employer].  ESS 24.    Objective:  Neurological Exam  Physical Exam Physical Examination:   Vitals:   09/04/22 1054  BP: 138/74  Pulse: 66    General Examination: The patient is a very pleasant 76 y.o. male in no acute distress. He appears well-developed and well-nourished and well groomed.   HEENT: Normocephalic, atraumatic, pupils are equal, round and reactive to light, corrective eyeglasses in place.  Face is symmetric without facial weakness, no dysarthria, no hypophonia, no voice tremor.  Airway examination reveals stable findings.  Tongue protrudes centrally and palate elevates symmetrically.  No carotid bruits.   Chest: Clear to auscultation without wheezing, rhonchi or crackles noted.   Heart: S1+S2+0, regular and normal without murmurs, rubs or gallops noted.    Abdomen: Soft, non-tender and non-distended.   Extremities: There is no obvious edema in the distal lower extremities bilaterally.     Skin: Warm and dry without trophic changes noted.   Musculoskeletal: exam  reveals no obvious joint deformities.    Neurologically:  Mental status: The patient is awake, alert and oriented in all 4 spheres. His immediate and remote memory, attention, language skills and fund of knowledge are fairly appropriate.  Details of his history are provided by his wife.  Thought process is linear. Mood is normal and affect is normal.  Cranial nerves II - XII are as described above under HEENT exam.  Motor exam: Normal bulk, strength and tone is noted. There is no obvious tremor. Fine motor skills and coordination: grossly intact.  Cerebellar testing: No dysmetria or intention tremor. There is no truncal or gait  ataxia.   Sensory exam: intact to light touch in the upper and lower extremities.  Gait, station and balance: He stands easily. No veering to one side is noted. No leaning to one side is noted. Posture is age-appropriate and stance is narrow based. Gait shows normal stride length and normal pace. No problems turning are noted.  No walking aid.   Assessment and Plan:  In summary, Mark Stewart is a 76 year old male with an underlying medical history of hypertension, hyperlipidemia, status post splenectomy as a teenager, stroke in 2017, diabetes, cataracts, allergies, and overweight state, who presents for follow-up consultation of his obstructive sleep apnea.  He is now on CPAP therapy at a pressure of 9 cm via full facemask.  He had trouble with tolerance of his AutoPap.  He had a titration study in August 2023 and has done better since then.  He is currently compliant with his CPAP machine and apnea scores are good.  Seal is good from the mask.  He has residual sleepiness but is advised to use his CPAP 7 to 8 hours on any given night and try to hydrate better with water.  He is advised to follow-up routinely at this point in sleep clinic in 1 year, he can see Mark Stewart next time.  For any general neurological issues or stroke related questions he will follow-up with his  primary neurologist, Dr. Leonie Stewart. We talked about the importance of secondary stroke prevention and the importance of maintaining a healthy lifestyle in general. He is commended for his treatment adherence with CPAP.  He is motivated to continue with treatment.  I answered all their questions today and the patient and his wife were in agreement.  He is advised not to drive when feeling sleepy. I spent 30 minutes in total face-to-face time and in reviewing records during pre-charting, more than 50% of which was spent in counseling and coordination of care, reviewing test results, reviewing medications and treatment regimen and/or in discussing or reviewing the diagnosis of OSA, the prognosis and treatment options. Pertinent laboratory and imaging test results that were available during this visit with the patient were reviewed by me and considered in my medical decision making (see chart for details).

## 2022-09-07 ENCOUNTER — Other Ambulatory Visit: Payer: Self-pay | Admitting: Legal Medicine

## 2022-09-07 ENCOUNTER — Ambulatory Visit: Payer: HMO | Attending: Cardiovascular Disease | Admitting: Cardiovascular Disease

## 2022-09-07 ENCOUNTER — Encounter: Payer: Self-pay | Admitting: Cardiovascular Disease

## 2022-09-07 VITALS — BP 126/62 | HR 62 | Ht 72.0 in | Wt 222.2 lb

## 2022-09-07 DIAGNOSIS — E1143 Type 2 diabetes mellitus with diabetic autonomic (poly)neuropathy: Secondary | ICD-10-CM

## 2022-09-07 DIAGNOSIS — E7800 Pure hypercholesterolemia, unspecified: Secondary | ICD-10-CM | POA: Diagnosis not present

## 2022-09-07 DIAGNOSIS — I10 Essential (primary) hypertension: Secondary | ICD-10-CM | POA: Diagnosis not present

## 2022-09-07 DIAGNOSIS — K2100 Gastro-esophageal reflux disease with esophagitis, without bleeding: Secondary | ICD-10-CM

## 2022-09-07 DIAGNOSIS — I69354 Hemiplegia and hemiparesis following cerebral infarction affecting left non-dominant side: Secondary | ICD-10-CM | POA: Diagnosis not present

## 2022-09-07 NOTE — Patient Instructions (Signed)
Medication Instructions:   Your physician recommends that you continue on your current medications as directed. Please refer to the Current Medication list given to you today.  *If you need a refill on your cardiac medications before your next appointment, please call your pharmacy*  Lab Work: Your physician recommends that you return for lab work in:  Fasting Lipid Panel-DO NOT eat or drink past midnight. Okay to have water to drink  If you have labs (blood work) drawn today and your tests are completely normal, you will receive your results only by: [place] (if you have MyChart) OR A paper copy in the mail If you have any lab test that is abnormal or we need to change your treatment, we will call you to review the results.  Testing/Procedures: NONE ordered at this time of appointment   Follow-Up: At [facility], you and your health needs are our priority.  As part of our continuing mission to provide you with exceptional heart care, we have created designated Provider Care Teams.  These Care Teams include your primary Cardiologist (physician) and Advanced Practice Providers (APPs -  Physician Assistants and Nurse Practitioners) who all work together to provide you with the care you need, when you need it.  We recommend signing up for the patient portal called "MyChart".  Sign up information is provided on this After Visit Summary.  MyChart is used to connect with patients for Virtual Visits (Telemedicine).  Patients are able to view lab/test results, encounter notes, upcoming appointments, etc.  Non-urgent messages can be sent to your provider as well.   To learn more about what you can do with MyChart, go to [email or web address].    Your next appointment:   1 year(s)  The format for your next appointment:   In Person  Provider:   Sanda Klein, MD     Other Instructions  Important Information About Sugar

## 2022-09-07 NOTE — Progress Notes (Unsigned)
Cardiology Office Note:    Date:  09/08/2022   ID:  Mark Stewart, DOB 06/23/1946, MRN [id number]  PCP:  Lillard Anes, MD  Cardiologist:  Sanda Klein, MD    Referring MD: Lillard Anes   No chief complaint on file. Severe hypertension  History of Present Illness:    Mark Stewart is a 76 y.o. male with a hx of long-standing and severe hypertension, insulin requiring type 2 diabetes mellitus, hyperlipidemia, right thalamic ischemic stroke in May 2017 with mild residual left lower extremity weakness.  This not been any major change in his health.  He continues to be rather sedentary.  As his wife puts it, ever since his stroke he lacks "initiative".  He continues to have mild orthostatic dizziness.  He is learned to get up gradually.  He had 1 fall in the bathroom, but he blames it on his weak and painful left knee.  He did not have dizziness or loss of consciousness at that time.  He has mild lower extremity edema around the ankles.  He sometimes wears compression stockings.  He has a dry nonproductive cough that is not positional.  The patient specifically denies any chest pain at rest exertion, dyspnea at rest or with exertion, orthopnea, paroxysmal nocturnal dyspnea, syncope, palpitations, focal neurological deficits, intermittent claudication, lower extremity edema, unexplained weight gain, cough, hemoptysis or wheezing.  Treatment with minoxidil has not been complicated by excessive edema, hirsutism, tachycardia or pericardial effusion to date.  Glycemic control is imperfect with a hemoglobin A1c of 9%.  His most recent LDL cholesterol was 79.  Duplex US did not show evidence of renal artery stenosis. 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  Past Medical History:  Diagnosis Date   Allergy    Cancer ([place])    skin, hand   Cataract    history of surgery bilaterally   Hypercholesteremia    Hypertension     ICAO (internal carotid artery occlusion)    Spleen absent    patient states had spleen removed in high school   Stroke [facility]) 02/2016    Past Surgical History:  Procedure Laterality Date   EYE SURGERY     HERNIA REPAIR  02/28/2018   HERNIA REPAIR Bilateral 11/2019   PROSTATE SURGERY     SPLENECTOMY      Current Medications: Current Meds  Medication Sig   amLODipine (NORVASC) 10 MG tablet TAKE 1 TABLET BY MOUTH EVERY DAY   aspirin EC 81 MG tablet Take 81 mg by mouth daily.   atorvastatin (LIPITOR) 80 MG tablet Take 1 tablet (80 mg total) by mouth daily.   buPROPion (WELLBUTRIN XL) 150 MG 24 hr tablet Take 150 mg by mouth daily.   carvedilol (COREG) 25 MG tablet Take 2 tablets (50 mg total) by mouth 2 (two) times daily with a meal. Keep 09/07/22 appt   Cholecalciferol (VITAMIN D3 PO) Take by mouth daily.   cloNIDine (CATAPRES - DOSED IN MG/24 HR) 0.2 mg/24hr patch UNWRAP AND APPLY 1 PATCH TO SKIN WEEKLY   empagliflozin (JARDIANCE) 25 MG TABS tablet Take 25 mg by mouth daily.    fluticasone (FLONASE) 50 MCG/ACT nasal spray Place 2 sprays into both nostrils daily as needed for allergies or rhinitis. Reported on 04/26/2016   hydrochlorothiazide (HYDRODIURIL) 25 MG tablet Take 1 tablet (25 mg total) by mouth daily.   Insulin Disposable Pump (OMNIPOD DASH 5 PACK PODS) MISC    minoxidil (LONITEN) 10  MG tablet TAKE 1/2 TABLET BY MOUTH EVERY DAY   omeprazole (PRILOSEC) 40 MG capsule TAKE 1 CAPSULE BY MOUTH EVERY DAY   spironolactone (ALDACTONE) 50 MG tablet TAKE 1 TABLET (50 MG TOTAL) BY MOUTH DAILY. SCHEDULE OFFICE VISIT FOR FUTURE REFILLS.   valsartan (DIOVAN) 320 MG tablet TAKE 1 TABLET BY MOUTH EVERY DAY     Allergies:   Bactrim [sulfamethoxazole-trimethoprim], Benazepril, Metformin and related, and Penicillins   Social History   Socioeconomic History   Marital status: Married    Spouse name: graye   Number of children: Not on file   Years of education: Not on file   Highest  education level: Not on file  Occupational History   Not on file  Tobacco Use   Smoking status: Former    Packs/day: 1.00    Years: 20.00    Total pack years: 20.00    Types: Cigarettes   Smokeless tobacco: Never   Tobacco comments:    patient states last cigarette 35 years ago  Substance and Sexual Activity   Alcohol use: No   Drug use: No   Sexual activity: Not Currently  Other Topics Concern   Not on file  Social History Narrative   Lives with wife   Right Handed   Drinks 4-5 cups caffeine daily   Social Determinants of Health   Financial Resource Strain: Low Risk  (08/29/2020)   Overall Financial Resource Strain (CARDIA)    Difficulty of Paying Living Expenses: Not hard at all  Food Insecurity: No Food Insecurity (08/29/2020)   Hunger Vital Sign    Worried About Running Out of Food in the Last Year: Never true    Ran Out of Food in the Last Year: Never true  Transportation Needs: No Transportation Needs (08/29/2020)   PRAPARE - Hydrologist (Medical): No    Lack of Transportation (Non-Medical): No  Physical Activity: Not on file  Stress: No Stress Concern Present (08/29/2020)   [place]    Feeling of Stress : Not at all  Social Connections: Moderately Integrated (08/29/2020)   Social Connection and Isolation Panel [NHANES]    Frequency of Communication with Friends and Family: Twice a week    Frequency of Social Gatherings with Friends and Family: Twice a week    Attends Religious Services: More than 4 times per year    Active Member of [employer] or Organizations: No    Attends Music therapist: Never    Marital Status: Married     Family History: The patient's family history includes Breast cancer in his mother and sister; Diabetes Mellitus II in his father. There is no history of Sleep apnea. ROS:   Please see the history of present illness.    All other  systems are reviewed and are negative.  EKGs/Labs/Other Studies Reviewed:    MRI from 02/26/2016 shows an acute/subacute ischemic infarct involving the right thalamus, also mild chronic small vessel ischemic disease Echo from 02/27/2016 shows a mildly dilated left ventricle (LVEDD 55 mm may actually be normal), normal EF 55-60 percent, abnormal relaxation, left atrium 39 mm  EKG:  EKG is ordered today, personally reviewed and shows sinus rhythm with mild first-degree AV block (PR interval 210 ms), nonspecific ST segment changes, QTc 448 ms   Recent Labs: 08/20/2022: ALT 11; BUN 26; Creatinine, Ser 1.26; Hemoglobin 15.1; Platelets 349; Potassium 4.7; Sodium 139   Recent Lipid Panel  Component Value Date/Time   CHOL 137 03/21/2021 0000   TRIG 124 03/21/2021 0000   HDL 32 (L) 03/21/2021 0000   CHOLHDL 4.3 03/21/2021 0000   CHOLHDL 3.6 02/27/2016 0536   VLDL 16 02/27/2016 0536   LDLCALC 83 03/21/2021 0000   August 20 2018 lipid profile Total cholesterol 158, HDL 51, LDL 111, triglycerides 106 Hemoglobin A1c 12%  06/02/2019 total cholesterol 134, HDL 41, LDL 75, triglycerides 88 Hemoglobin A1c 8.1%  Physical Exam:    VS:  BP 126/62 (BP Location: Left Arm, Patient Position: Sitting, Cuff Size: Large)   Pulse 62   Ht 6' (1.829 m)   Wt 100.8 kg   SpO2 93%   BMI 30.14 kg/m     Wt Readings from Last 3 Encounters:  09/07/22 100.8 kg  09/04/22 99.3 kg  05/31/22 98 kg      General: Alert, oriented x3, no distress, overweight, overlying obese Head: no evidence of trauma, PERRL, EOMI, no exophtalmos or lid lag, no myxedema, no xanthelasma; normal ears, nose and oropharynx Neck: normal jugular venous pulsations and no hepatojugular reflux; brisk carotid pulses without delay and no carotid bruits Chest: clear to auscultation, no signs of consolidation by percussion or palpation, normal fremitus, symmetrical and full respiratory excursions Cardiovascular: normal position and  quality of the apical impulse, regular rhythm, normal first and second heart sounds, no murmurs, rubs or gallops Abdomen: no tenderness or distention, no masses by palpation, no abnormal pulsatility or arterial bruits, normal bowel sounds, no hepatosplenomegaly Extremities: no clubbing, cyanosis or edema; 2+ radial, ulnar and brachial pulses bilaterally; 2+ right femoral, posterior tibial and dorsalis pedis pulses; 2+ left femoral, posterior tibial and dorsalis pedis pulses; no subclavian or femoral bruits Neurological: grossly nonfocal Psych: Normal mood and affect     ASSESSMENT:    1. Essential hypertension   2. Hypercholesterolemia   3. Hemiparesis affecting left side as late effect of stroke ([place])   4. Diabetic autonomic neuropathy associated with type 2 diabetes mellitus (HCC)      PLAN:    In order of problems listed above:  HTN: Well-controlled,but requiring high doses of numerous medications (maximum dose valsartan and carvedilol, in addition to thiazide diuretic, spironolactone and clonidine patch), could not achieve good control and will added minoxidil. L hemiparesis: No new neurological events.  Weakness in his left leg does make him prone to falls.  Minimal residual deficits.  What has persisted after his stroke he has apathy and significant deterioration in executive skills.  On aspirin 81 mg daily.  Atrial fibrillation has never detected. HLP: Does not have a recent lipid profile.  Note absence of known CAD or PAD so target LDL less than 100 is acceptable, but with history of stroke prefer LDL less than 70.  Chronically low HDL.  Advised weight loss.  More physical activity would be beneficial. DM: Control is acceptable, albeit not perfect.  Neuropathy may be contributing to his instability.  Careful to avoid falls and watch for any foot lesions. Creatinine was slightly higher at 1.26 on the most recent assay (borderline GFR at 60), although it has been higher in the past.   Baseline probably around 1.0.  Reminded him to stay well-hydrated since he takes the SGLT2 inhibitor.  Medication Adjustments/Labs and Tests Ordered: Current medicines are reviewed at length with the patient today.  Concerns regarding medicines are outlined above.  Orders Placed This Encounter  Procedures   Lipid panel   EKG 12-Lead    No  orders of the defined types were placed in this encounter.   Patient Instructions  Medication Instructions:   Your physician recommends that you continue on your current medications as directed. Please refer to the Current Medication list given to you today.  *If you need a refill on your cardiac medications before your next appointment, please call your pharmacy*  Lab Work: Your physician recommends that you return for lab work in:  Fasting Lipid Panel-DO NOT eat or drink past midnight. Okay to have water to drink  If you have labs (blood work) drawn today and your tests are completely normal, you will receive your results only by: [place] (if you have MyChart) OR A paper copy in the mail If you have any lab test that is abnormal or we need to change your treatment, we will call you to review the results.  Testing/Procedures: NONE ordered at this time of appointment   Follow-Up: At [facility], you and your health needs are our priority.  As part of our continuing mission to provide you with exceptional heart care, we have created designated Provider Care Teams.  These Care Teams include your primary Cardiologist (physician) and Advanced Practice Providers (APPs -  Physician Assistants and Nurse Practitioners) who all work together to provide you with the care you need, when you need it.  We recommend signing up for the patient portal called "MyChart".  Sign up information is provided on this After Visit Summary.  MyChart is used to connect with patients for Virtual Visits (Telemedicine).  Patients are able to view lab/test  results, encounter notes, upcoming appointments, etc.  Non-urgent messages can be sent to your provider as well.   To learn more about what you can do with MyChart, go to [email or web address].    Your next appointment:   1 year(s)  The format for your next appointment:   In Person  Provider:   Sanda Klein, MD     Other Instructions  Important Information About Sugar         Signed, Sanda Klein, MD  09/08/2022 8:48 AM    [place]

## 2022-09-08 DIAGNOSIS — E1143 Type 2 diabetes mellitus with diabetic autonomic (poly)neuropathy: Secondary | ICD-10-CM | POA: Insufficient documentation

## 2022-09-17 LAB — HEMOGLOBIN A1C
Hemoglobin A1C: 7.4
Hemoglobin A1C: 7.4

## 2022-09-21 ENCOUNTER — Ambulatory Visit (INDEPENDENT_AMBULATORY_CARE_PROVIDER_SITE_OTHER): Payer: HMO | Admitting: Podiatry

## 2022-09-21 DIAGNOSIS — M2142 Flat foot [pes planus] (acquired), left foot: Secondary | ICD-10-CM

## 2022-09-21 DIAGNOSIS — M2041 Other hammer toe(s) (acquired), right foot: Secondary | ICD-10-CM

## 2022-09-21 DIAGNOSIS — M2141 Flat foot [pes planus] (acquired), right foot: Secondary | ICD-10-CM

## 2022-09-21 DIAGNOSIS — M2042 Other hammer toe(s) (acquired), left foot: Secondary | ICD-10-CM

## 2022-09-21 DIAGNOSIS — E1142 Type 2 diabetes mellitus with diabetic polyneuropathy: Secondary | ICD-10-CM

## 2022-09-24 ENCOUNTER — Encounter: Payer: Self-pay | Admitting: Nurse Practitioner

## 2022-09-24 ENCOUNTER — Ambulatory Visit (INDEPENDENT_AMBULATORY_CARE_PROVIDER_SITE_OTHER): Payer: HMO | Admitting: Nurse Practitioner

## 2022-09-24 VITALS — BP 130/62 | HR 63 | Temp 97.3°F | Resp 16 | Ht 72.0 in | Wt 220.4 lb

## 2022-09-24 DIAGNOSIS — S8012XA Contusion of left lower leg, initial encounter: Secondary | ICD-10-CM | POA: Diagnosis not present

## 2022-09-24 MED ORDER — DOXYCYCLINE HYCLATE 100 MG PO TABS: 100.0000 mg | ORAL_TABLET | Freq: Two times a day (BID) | ORAL | 0 refills | Status: DC

## 2022-09-24 NOTE — Progress Notes (Signed)
Subjective:  Patient ID: Mark Stewart, male    DOB: 1946-07-04  Age: 76 y.o. MRN: [id number]  Chief Complaint  Patient presents with   sore on knee    HPI Patient came in today accompanied with a wife with a complain of sore on left knee and left lateral aspect of the leg about 2 weeks post fall. Patient had another episode of fall on Saturday and developed hematoma in the lateral aspect of the left leg. He says his ankle keeps swelling every now and then and he is on Amlodipine. He is following Dr. Sallyanne Kuster as his cardiologist. Patient's wife is concern about infection.   Current Outpatient Medications on File Prior to Visit  Medication Sig Dispense Refill   amLODipine (NORVASC) 10 MG tablet TAKE 1 TABLET BY MOUTH EVERY DAY 90 tablet 2   aspirin EC 81 MG tablet Take 81 mg by mouth daily.     atorvastatin (LIPITOR) 80 MG tablet Take 1 tablet (80 mg total) by mouth daily. 90 tablet 1   buPROPion (WELLBUTRIN XL) 150 MG 24 hr tablet Take 150 mg by mouth daily.     carvedilol (COREG) 25 MG tablet Take 2 tablets (50 mg total) by mouth 2 (two) times daily with a meal. Keep 09/07/22 appt 360 tablet 0   Cholecalciferol (VITAMIN D3 PO) Take by mouth daily.     cloNIDine (CATAPRES - DOSED IN MG/24 HR) 0.2 mg/24hr patch UNWRAP AND APPLY 1 PATCH TO SKIN WEEKLY 12 patch 1   empagliflozin (JARDIANCE) 25 MG TABS tablet Take 25 mg by mouth daily.      fluticasone (FLONASE) 50 MCG/ACT nasal spray Place 2 sprays into both nostrils daily as needed for allergies or rhinitis. Reported on 04/26/2016 18.2 mL 2   hydrochlorothiazide (HYDRODIURIL) 25 MG tablet Take 1 tablet (25 mg total) by mouth daily. 90 tablet 2   Insulin Disposable Pump (OMNIPOD DASH 5 PACK PODS) MISC      minoxidil (LONITEN) 10 MG tablet TAKE 1/2 TABLET BY MOUTH EVERY DAY 45 tablet 2   omeprazole (PRILOSEC) 40 MG capsule TAKE 1 CAPSULE BY MOUTH EVERY DAY 90 capsule 2   spironolactone (ALDACTONE) 50 MG tablet TAKE 1 TABLET (50 MG TOTAL) BY  MOUTH DAILY. SCHEDULE OFFICE VISIT FOR FUTURE REFILLS. 30 tablet 0   valsartan (DIOVAN) 320 MG tablet TAKE 1 TABLET BY MOUTH EVERY DAY 90 tablet 2   No current facility-administered medications on file prior to visit.   Past Medical History:  Diagnosis Date   Allergy    Cancer ([place])    skin, hand   Cataract    history of surgery bilaterally   Hypercholesteremia    Hypertension    ICAO (internal carotid artery occlusion)    Spleen absent    patient states had spleen removed in high school   Stroke [facility]) 02/2016   Past Surgical History:  Procedure Laterality Date   EYE SURGERY     HERNIA REPAIR  02/28/2018   HERNIA REPAIR Bilateral 11/2019   PROSTATE SURGERY     SPLENECTOMY      Family History  Problem Relation Age of Onset   Breast cancer Mother    Diabetes Mellitus II Father    Breast cancer Sister    Sleep apnea Neg Hx    Social History   Socioeconomic History   Marital status: Married    Spouse name: graye   Number of children: Not on file   Years of education: Not on  file   Highest education level: Not on file  Occupational History   Not on file  Tobacco Use   Smoking status: Former    Packs/day: 1.00    Years: 20.00    Total pack years: 20.00    Types: Cigarettes   Smokeless tobacco: Never   Tobacco comments:    patient states last cigarette 35 years ago  Substance and Sexual Activity   Alcohol use: No   Drug use: No   Sexual activity: Not Currently  Other Topics Concern   Not on file  Social History Narrative   Lives with wife   Right Handed   Drinks 4-5 cups caffeine daily   Social Determinants of Health   Financial Resource Strain: Low Risk  (08/29/2020)   Overall Financial Resource Strain (CARDIA)    Difficulty of Paying Living Expenses: Not hard at all  Food Insecurity: No Food Insecurity (08/29/2020)   Hunger Vital Sign    Worried About Running Out of Food in the Last Year: Never true    Ran Out of Food in the Last Year: Never true   Transportation Needs: No Transportation Needs (08/29/2020)   PRAPARE - Hydrologist (Medical): No    Lack of Transportation (Non-Medical): No  Physical Activity: Not on file  Stress: No Stress Concern Present (08/29/2020)   [place]    Feeling of Stress : Not at all  Social Connections: Moderately Integrated (08/29/2020)   Social Connection and Isolation Panel [NHANES]    Frequency of Communication with Friends and Family: Twice a week    Frequency of Social Gatherings with Friends and Family: Twice a week    Attends Religious Services: More than 4 times per year    Active Member of [employer] or Organizations: No    Attends Archivist Meetings: Never    Marital Status: Married    Review of Systems  Constitutional:  Negative for chills, fatigue and fever.  HENT:  Negative for congestion, ear pain and sore throat.   Respiratory:  Negative for cough and shortness of breath.   Cardiovascular:  Negative for chest pain and palpitations.  Gastrointestinal:  Negative for abdominal pain, constipation, diarrhea, nausea and vomiting.  Endocrine: Negative for polydipsia, polyphagia and polyuria.  Genitourinary:  Negative for dysuria and frequency.  Musculoskeletal:  Positive for arthralgias. Negative for back pain, gait problem, joint swelling and myalgias.       Pain on knee and left lateral side of the leg  Neurological:  Negative for dizziness and headaches.  Psychiatric/Behavioral:  Negative for dysphoric mood. The patient is not nervous/anxious.      Objective:  BP 130/62   Pulse 63   Temp (!) 97.3 F (36.3 C)   Resp 16   Ht 6' (1.829 m)   Wt 220 lb 6.4 oz (100 kg)   SpO2 96%   BMI 29.89 kg/m      09/24/2022    1:32 PM 09/07/2022    2:52 PM 09/04/2022   10:54 AM  BP/Weight  Systolic BP 846 659 935  Diastolic BP 62 62 74  Wt. (Lbs) 220.4 222.2 219  BMI 29.89 kg/m2 30.14  kg/m2 29.7 kg/m2    Physical Exam Vitals reviewed.  Constitutional:      Appearance: Normal appearance.  Neck:     Vascular: No carotid bruit.  Cardiovascular:     Rate and Rhythm: Normal rate and regular rhythm.  Pulses: Normal pulses.     Heart sounds: Normal heart sounds.  Pulmonary:     Breath sounds: Normal breath sounds.  Abdominal:     Tenderness: There is no abdominal tenderness.  Musculoskeletal:        General: Swelling (bilateral +1 edema), tenderness (left leg on hematoma side) and signs of injury (healing skin tear post fall 2 weeks ago) present. No deformity.     Right lower leg: Edema (+1) present.     Left lower leg: Edema (+1) present.  Skin:    General: Skin is warm.  Neurological:     Mental Status: He is alert and oriented to person, place, and time.  Psychiatric:        Mood and Affect: Mood normal.        Behavior: Behavior normal.       Lab Results  Component Value Date   WBC 8.3 08/20/2022   HGB 15.1 08/20/2022   HCT 45.9 08/20/2022   PLT 349 08/20/2022   GLUCOSE 298 (H) 08/20/2022   CHOL 137 03/21/2021   TRIG 124 03/21/2021   HDL 32 (L) 03/21/2021   LDLCALC 83 03/21/2021   ALT 11 08/20/2022   AST 11 08/20/2022   NA 139 08/20/2022   K 4.7 08/20/2022   CL 105 08/20/2022   CREATININE 1.26 08/20/2022   BUN 26 08/20/2022   CO2 22 08/20/2022   HGBA1C 8.6 (H) 03/21/2021   MICROALBUR 150 03/21/2021    Assessment & Plan:   Problem List Items Addressed This Visit       Other   Hematoma of left lower leg - Primary    Please read your patient instructions regarding caring of hematoma Take OTC tylenol for knee pain Finish the course of antibiotics to prevent possible infection Watch the site ever day for swelling or changes in color  Follow up if condition does not resolve within a week or two      Relevant Medications   doxycycline (VIBRA-TABS) 100 MG tablet    Follow-up:  as needed  An After Visit Summary was printed and given  to the patient.  Neil Crouch, [place] [phone]

## 2022-09-24 NOTE — Assessment & Plan Note (Signed)
Please read your patient instructions regarding caring of hematoma Take OTC tylenol for knee pain Finish the course of antibiotics to prevent possible infection Watch the site ever day for swelling or changes in color  Follow up if condition does not resolve within a week or two

## 2022-09-24 NOTE — Patient Instructions (Addendum)
Follow up as needed Finish the course of antibiotics  Hematoma A hematoma is a collection of blood. A hematoma can happen: Under the skin. In an organ. In a body space. In a joint space. In other tissues. The blood can thicken (clot) to form a lump that you can see and feel. The lump is often hard and may become sore and tender. The lump can be very small or very big. Most hematomas get better in a few days to weeks. However, some of these may be serious and need medical care. What are the causes? This condition is caused by: An injury. Blood that leaks under the skin. Problems from surgeries. Medical conditions that cause bleeding or bruising. What increases the risk? You are more likely to develop this condition if: You are an older adult. You use medicines that thin your blood. You use NSAIDs, such as ibuprofen, often for pain. You play contact sports. What are the signs or symptoms? Symptoms depend on where the hematoma is in your body. If the hematoma is under the skin, there is: A firm lump on the body. Pain and tenderness in the area. Bruising. The skin above the lump may be blue, dark blue, purple-red, or yellowish. If the hematoma is deep in the tissues or body spaces, there may be: Blood in the stomach. This may cause pain in the belly (abdomen), weakness, passing out (fainting), and shortness of breath. Blood in the head. This may cause a headache, weakness, trouble speaking or understanding speech, or passing out. How is this treated? Treatment depends on the cause, size, and location of the hematoma. Treatment may include: Doing nothing. Many hematomas go away on their own without treatment. Surgery or close monitoring. This may be needed for large hematomas or hematomas that affect the body's organs. Medicines. These may be given if a medical condition caused the hematoma. Follow these instructions at home: Managing pain, stiffness, and swelling  If told, put ice  on the injured area. To do this: Put ice in a plastic bag. Place a towel between your skin and the bag. Leave the ice on for 20 minutes, 2-3 times a day for the first two days. If your skin turns bright red, take off the ice right away to prevent skin damage. The risk of skin damage is higher if you cannot feel pain, heat, or cold. If told, put heat on the injured area. Do this as often as told by your doctor. Use the heat source that your doctor recommends, such as a moist heat pack or a heating pad. Place a towel between your skin and the heat source. Leave the heat on for 20-30 minutes. If your skin turns bright red, take off the heat right away to prevent burns. The risk of burns is higher if you cannot feel pain, heat, or cold. Raise the injured area above the level of your heart while you are sitting or lying down. Wrap the affected area with an elastic bandage, if told by your doctor. Do not wrap the bandage too tight. If your hematoma is on a leg or foot and is painful, your doctor may give you crutches. Use them as told by your doctor. General instructions Take over-the-counter and prescription medicines only as told by your doctor. Keep all follow-up visits. Your doctor may want to see how your hematoma is healing with treatment. Contact a doctor if: You have a fever. The swelling or bruising gets worse. You start to get more hematomas.  Your pain gets worse. Your pain is not getting better with medicine. The skin over the hematoma breaks or starts to bleed. Get help right away if: Your hematoma is in your chest or belly and you: Pass out. Feel weak. Become short of breath. You have a hematoma on your scalp that is caused by a fall or injury, and you: Have a headache that gets worse. Have trouble speaking or understanding speech. Become less alert or you pass out. These symptoms may be an emergency. Get help right away. Call 911. Do not wait to see if the symptoms will go  away. Do not drive yourself to the hospital This information is not intended to replace advice given to you by your health care provider. Make sure you discuss any questions you have with your health care provider. Document Revised: 03/19/2022 Document Reviewed: 03/19/2022 Elsevier Patient Education  [place].

## 2022-09-26 ENCOUNTER — Ambulatory Visit: Payer: HMO | Admitting: Physician Assistant

## 2022-10-11 ENCOUNTER — Ambulatory Visit: Payer: HMO | Admitting: Podiatry

## 2022-10-11 VITALS — BP 134/82

## 2022-10-11 DIAGNOSIS — E1142 Type 2 diabetes mellitus with diabetic polyneuropathy: Secondary | ICD-10-CM

## 2022-10-11 DIAGNOSIS — B351 Tinea unguium: Secondary | ICD-10-CM | POA: Diagnosis not present

## 2022-10-11 DIAGNOSIS — M79674 Pain in right toe(s): Secondary | ICD-10-CM

## 2022-10-11 DIAGNOSIS — M79675 Pain in left toe(s): Secondary | ICD-10-CM

## 2022-10-11 DIAGNOSIS — L600 Ingrowing nail: Secondary | ICD-10-CM | POA: Diagnosis not present

## 2022-10-11 DIAGNOSIS — M1712 Unilateral primary osteoarthritis, left knee: Secondary | ICD-10-CM | POA: Diagnosis not present

## 2022-10-11 NOTE — Progress Notes (Signed)
  Subjective:  Patient ID: Mark Stewart, male    DOB: [birth year],  MRN: [id number]  Mark Stewart presents to clinic today for at risk foot care with history of diabetic neuropathy and painful thick toenails that are difficult to trim. Pain interferes with ambulation. Aggravating factors include wearing enclosed shoe gear. Pain is relieved with periodic professional debridement.  Chief Complaint  Patient presents with   Nail Problem    DFC BG - 126 A1C - 8.4 PCP - Dr Henrene Pastor, last OV 09/2022   New problem(s): None.   PCP is Lillard Anes, MD.  Allergies  Allergen Reactions   Bactrim [Sulfamethoxazole-Trimethoprim] Hives   Benazepril Cough   Metformin And Related Rash   Penicillins Rash    Has patient had a PCN reaction causing immediate rash, facial/tongue/throat swelling, SOB or lightheadedness with hypotension: yes Has patient had a PCN reaction causing severe rash involving mucus membranes or skin necrosis: no Has patient had a PCN reaction that required hospitalization: no Has patient had a PCN reaction occurring within the last 10 years: no If all of the above answers are "NO", then may proceed with Cephalosporin use.     Review of Systems: Negative except as noted in the HPI.  Objective:  Vitals:   10/11/22 1150  BP: 134/82   Mark Stewart is a pleasant 77 y.o. male obese in NAD. AAO x 3. Vascular Examination: Vascular status intact b/l with palpable pedal pulses. Pedal hair absent b/l. CFT immediate b/l. No pain with calf compression b/l. Skin temperature gradient WNL b/l. Trace edema noted BLE.  Neurological Examination: Pt has subjective symptoms of neuropathy. Protective sensation decreased with 10 gram monofilament b/l. Proprioception intact bilaterally.  Dermatological Examination: Pedal skin with normal turgor, texture and tone b/l. Toenails 1-5 b/l thick, discolored, elongated with subungual debris and pain on dorsal palpation.   No  hyperkeratotic lesions noted b/l. No open wounds b/l LE. No interdigital macerations noted b/l LE.    Incurvated nailplate left 2nd toe bilateral borders. Nail border hypertrophy absent. There is tenderness to palpation. Sign(s) of infection: no clinical signs of infection noted on examination today.  Musculoskeletal Examination: Normal muscle strength 5/5 to all lower extremity muscle groups bilaterally. Hammertoe deformity noted 2-5 b/l. Pes planus deformity noted bilateral LE.Marland Kitchen No pain, crepitus or joint limitation noted with ROM b/l LE.  Patient ambulates independently without assistive aids.  Radiographs: None  Assessment/Plan: 1. Pain due to onychomycosis of toenails of both feet   2. Ingrown toenail without infection   3. Diabetic polyneuropathy associated with type 2 diabetes mellitus ([place])     No orders of the defined types were placed in this encounter.   -Consent given for treatment as described below: -Examined patient. -Toenails 1-5 b/l were debrided in length and girth with sterile nail nippers and dremel without iatrogenic bleeding.  -No invasive procedure(s) performed. Offending nail border debrided and curretaged L 2nd toe utilizing sterile nail nipper and currette. Border(s) cleansed with alcohol and triple antibiotic ointment applied. Patient/POA/Caregiver/Facility instructed to apply Neosporin Cream  to L 2nd toe once daily for 7 days. Call office if there are any concerns. -Patient/POA to call should there be question/concern in the interim.   Return in about 3 months (around 01/10/2023).  Marzetta Board, DPM

## 2022-10-13 ENCOUNTER — Other Ambulatory Visit: Payer: Self-pay | Admitting: Cardiovascular Disease

## 2022-10-14 ENCOUNTER — Encounter: Payer: Self-pay | Admitting: Podiatry

## 2022-10-16 ENCOUNTER — Other Ambulatory Visit: Payer: Self-pay | Admitting: Physician Assistant

## 2022-10-16 ENCOUNTER — Encounter: Payer: Self-pay | Admitting: Physician Assistant

## 2022-10-16 ENCOUNTER — Ambulatory Visit (INDEPENDENT_AMBULATORY_CARE_PROVIDER_SITE_OTHER): Payer: HMO | Admitting: Physician Assistant

## 2022-10-16 VITALS — BP 116/62 | HR 65 | Temp 97.2°F | Ht 72.0 in | Wt 218.4 lb

## 2022-10-16 DIAGNOSIS — I10 Essential (primary) hypertension: Secondary | ICD-10-CM | POA: Diagnosis not present

## 2022-10-16 DIAGNOSIS — E7800 Pure hypercholesterolemia, unspecified: Secondary | ICD-10-CM | POA: Diagnosis not present

## 2022-10-16 DIAGNOSIS — Z125 Encounter for screening for malignant neoplasm of prostate: Secondary | ICD-10-CM

## 2022-10-16 DIAGNOSIS — F331 Major depressive disorder, recurrent, moderate: Secondary | ICD-10-CM

## 2022-10-16 DIAGNOSIS — Z794 Long term (current) use of insulin: Secondary | ICD-10-CM | POA: Diagnosis not present

## 2022-10-16 DIAGNOSIS — E1165 Type 2 diabetes mellitus with hyperglycemia: Secondary | ICD-10-CM | POA: Diagnosis not present

## 2022-10-16 DIAGNOSIS — E782 Mixed hyperlipidemia: Secondary | ICD-10-CM | POA: Insufficient documentation

## 2022-10-16 MED ORDER — MUPIROCIN CALCIUM 2 % EX CREA: 1.0000 | TOPICAL_CREAM | Freq: Two times a day (BID) | CUTANEOUS | 0 refills | Status: DC

## 2022-10-16 MED ORDER — MUPIROCIN 2 % EX OINT: 1.0000 | TOPICAL_OINTMENT | Freq: Two times a day (BID) | CUTANEOUS | 0 refills | Status: DC

## 2022-10-16 NOTE — Progress Notes (Signed)
Subjective:  Patient ID: Mark Stewart, male    DOB: [birth year]  Age: 77 y.o. MRN: [id number]  Chief Complaint  Patient presents with   Hyperlipidemia    HPI  Pt first visit with me   Pt presents for follow up of hypertension. Patient has had hypertension for more than 20 years.  He states that this is managed by cardiology.  He is on multiple medications including norvasc 10, coreg 25, clonidine patch, hctz '25mg'$ , minoxidil 10, aldactone 50 and diovan 320 His cardiologist is Dr Sallyanne Kuster  Pt with longstanding history of almost 30 years of diabetes.  He follows with Dr Posey Pronto endocrinologist regularly.  His last hgb A1c was a few months ago - He is currently on Novolog through [facility] and Jardiance '25mg'$  He states his glucose readings have been good when he checks them He is up to date on eye exam however he is due for urine microalbumin  Pt with history of GERD - stable on prilosec '40mg'$  qd  Pt has bupropion in med list but he is unsure why he is taking this medication however there is past history of mild depression  Pt is due for colonoscopy but states he will call Dr Lyda Jester himself to schedule  Pt is due to check PSA - states he has history of BPH but has not seen urology in awhile  Pt was seen for lesion on left lower leg a few weeks ago after a fall.  States it is healing but still scabbed over  Pt with history of obstructive sleep apnea and does use CPAP  Current Outpatient Medications on File Prior to Visit  Medication Sig Dispense Refill   amLODipine (NORVASC) 10 MG tablet TAKE 1 TABLET BY MOUTH EVERY DAY 90 tablet 2   aspirin EC 81 MG tablet Take 81 mg by mouth daily.     atorvastatin (LIPITOR) 80 MG tablet Take 1 tablet (80 mg total) by mouth daily. 90 tablet 1   buPROPion (WELLBUTRIN XL) 150 MG 24 hr tablet Take 150 mg by mouth daily.     carvedilol (COREG) 25 MG tablet Take 1 tablet (25 mg total) by mouth 2 (two) times daily with a meal. 120 tablet 5    Cholecalciferol (VITAMIN D3 PO) Take by mouth daily.     cloNIDine (CATAPRES - DOSED IN MG/24 HR) 0.2 mg/24hr patch UNWRAP AND APPLY 1 PATCH TO SKIN WEEKLY 12 patch 1   empagliflozin (JARDIANCE) 25 MG TABS tablet Take 25 mg by mouth daily.      fluticasone (FLONASE) 50 MCG/ACT nasal spray Place 2 sprays into both nostrils daily as needed for allergies or rhinitis. Reported on 04/26/2016 18.2 mL 2   hydrochlorothiazide (HYDRODIURIL) 25 MG tablet Take 1 tablet (25 mg total) by mouth daily. 90 tablet 2   Insulin Disposable Pump (OMNIPOD DASH 5 PACK PODS) MISC      minoxidil (LONITEN) 10 MG tablet TAKE 1/2 TABLET BY MOUTH EVERY DAY 45 tablet 2   NOVOLOG 100 UNIT/ML injection USE AS DIRECTED IN INSULIN PUMP. MDD: 100UNITS/DAY     omeprazole (PRILOSEC) 40 MG capsule TAKE 1 CAPSULE BY MOUTH EVERY DAY 90 capsule 2   spironolactone (ALDACTONE) 50 MG tablet TAKE 1 TABLET (50 MG TOTAL) BY MOUTH DAILY. SCHEDULE OFFICE VISIT FOR FUTURE REFILLS. 30 tablet 0   valsartan (DIOVAN) 320 MG tablet TAKE 1 TABLET BY MOUTH EVERY DAY 90 tablet 2   No current facility-administered medications on file prior to visit.   Past  Medical History:  Diagnosis Date   Allergy    Cancer ([place])    skin, hand   Cataract    history of surgery bilaterally   Hypercholesteremia    Hypertension    ICAO (internal carotid artery occlusion)    Spleen absent    patient states had spleen removed in high school   Stroke [facility]) 02/2016   Past Surgical History:  Procedure Laterality Date   EYE SURGERY     HERNIA REPAIR  02/28/2018   HERNIA REPAIR Bilateral 11/2019   PROSTATE SURGERY     SPLENECTOMY      Family History  Problem Relation Age of Onset   Breast cancer Mother    Diabetes Mellitus II Father    Breast cancer Sister    Sleep apnea Neg Hx    Social History   Socioeconomic History   Marital status: Married    Spouse name: graye   Number of children: Not on file   Years of education: Not on file   Highest education  level: Not on file  Occupational History   Not on file  Tobacco Use   Smoking status: Former    Packs/day: 1.00    Years: 20.00    Total pack years: 20.00    Types: Cigarettes   Smokeless tobacco: Never   Tobacco comments:    patient states last cigarette 35 years ago  Substance and Sexual Activity   Alcohol use: No   Drug use: No   Sexual activity: Not Currently  Other Topics Concern   Not on file  Social History Narrative   Lives with wife   Right Handed   Drinks 4-5 cups caffeine daily   Social Determinants of Health   Financial Resource Strain: Low Risk  (08/29/2020)   Overall Financial Resource Strain (CARDIA)    Difficulty of Paying Living Expenses: Not hard at all  Food Insecurity: No Food Insecurity (08/29/2020)   Hunger Vital Sign    Worried About Running Out of Food in the Last Year: Never true    Ran Out of Food in the Last Year: Never true  Transportation Needs: No Transportation Needs (08/29/2020)   PRAPARE - Hydrologist (Medical): No    Lack of Transportation (Non-Medical): No  Physical Activity: Not on file  Stress: No Stress Concern Present (08/29/2020)   [place]    Feeling of Stress : Not at all  Social Connections: Moderately Integrated (08/29/2020)   Social Connection and Isolation Panel [NHANES]    Frequency of Communication with Friends and Family: Twice a week    Frequency of Social Gatherings with Friends and Family: Twice a week    Attends Religious Services: More than 4 times per year    Active Member of [employer] or Organizations: No    Attends Archivist Meetings: Never    Marital Status: Married    Review of Systems CONSTITUTIONAL: Negative for chills, fatigue, fever, unintentional weight gain and unintentional weight loss.  E/N/T: Negative for ear pain, nasal congestion and sore throat.  CARDIOVASCULAR: Negative for chest pain,  dizziness, palpitations and pedal edema.  RESPIRATORY: Negative for recent cough and dyspnea.  GASTROINTESTINAL: Negative for abdominal pain, acid reflux symptoms, constipation, diarrhea, nausea and vomiting.  MSK: Negative for arthralgias and myalgias.  INTEGUMENTARY: Negative for rash.  NEUROLOGICAL: Negative for dizziness and headaches.  PSYCHIATRIC: Negative for sleep disturbance and to question depression screen.  Negative  for depression, negative for anhedonia.       Objective:  PHYSICAL EXAM:   VS: BP 116/62 (BP Location: Left Arm, Patient Position: Sitting, Cuff Size: Large)   Pulse 65   Temp (!) 97.2 F (36.2 C) (Temporal)   Ht 6' (1.829 m)   Wt 218 lb 6.4 oz (99.1 kg)   SpO2 97%   BMI 29.62 kg/m   GEN: Well nourished, well developed, in no acute distress  Cardiac: RRR; no murmurs, rubs, Respiratory:  normal respiratory rate and pattern with no distress - normal breath sounds with no rales, rhonchi, wheezes or rubs  Skin: scabbed lesion noted on left lower leg - no acute infection Psych: euthymic mood, appropriate affect and demeanor   Lab Results  Component Value Date   WBC 8.3 08/20/2022   HGB 15.1 08/20/2022   HCT 45.9 08/20/2022   PLT 349 08/20/2022   GLUCOSE 298 (H) 08/20/2022   CHOL 137 03/21/2021   TRIG 124 03/21/2021   HDL 32 (L) 03/21/2021   LDLCALC 83 03/21/2021   ALT 11 08/20/2022   AST 11 08/20/2022   NA 139 08/20/2022   K 4.7 08/20/2022   CL 105 08/20/2022   CREATININE 1.26 08/20/2022   BUN 26 08/20/2022   CO2 22 08/20/2022   HGBA1C 7.4 09/17/2022   HGBA1C 7.4 09/17/2022   MICROALBUR 150 03/21/2021      Assessment & Plan:   Problem List Items Addressed This Visit       Cardiovascular and Mediastinum   Essential hypertension   Relevant Orders   CBC with Differential/Platelet   Comprehensive metabolic panel   Lipid panel Continue current meds Follow up with cardiology as directed     Endocrine   Diabetes mellitus with  hyperglycemia, with long-term current use of insulin ([place]) - Primary   Relevant Orders   Microalbumin / creatinine urine ratio   CBC with Differential/Platelet   Comprehensive metabolic panel   Lipid panel Continue meds     Other   Hypercholesterolemia   Relevant Orders   CBC with Differential/Platelet   Comprehensive metabolic panel   Lipid panel Continue meds   Prostate cancer screening   Relevant Orders   PSA   Mixed hyperlipidemia Continue meds Watch diet   Major depressive disorder, recurrent episode, moderate ([place]) Continue wellbutrin  Abrasion leg Rx for bactroban  .  Meds ordered this encounter  Medications   DISCONTD: mupirocin cream (BACTROBAN) 2 %    Sig: Apply 1 Application topically 2 (two) times daily.    Dispense:  15 g    Refill:  0    Order Specific Question:   Supervising Provider    AnswerShelton Silvas    Orders Placed This Encounter  Procedures   Microalbumin / creatinine urine ratio   CBC with Differential/Platelet   Comprehensive metabolic panel   Lipid panel   PSA           Follow-up: Return in about 6 months (around 04/16/2023) for chronic fasting follow up --- later this week for fasting labs.  An After Visit Summary was printed and given to the patient.  Yetta Flock [facility] [phone]

## 2022-10-17 DIAGNOSIS — Z794 Long term (current) use of insulin: Secondary | ICD-10-CM | POA: Diagnosis not present

## 2022-10-17 DIAGNOSIS — E1165 Type 2 diabetes mellitus with hyperglycemia: Secondary | ICD-10-CM | POA: Diagnosis not present

## 2022-10-18 ENCOUNTER — Other Ambulatory Visit: Payer: HMO

## 2022-10-18 LAB — MICROALBUMIN / CREATININE URINE RATIO
Creatinine, Urine: 85.3 mg/dL
Microalb/Creat Ratio: 136 mg/g creat — ABNORMAL HIGH (ref 0–29)
Microalbumin, Urine: 115.6 ug/mL

## 2022-10-19 DIAGNOSIS — E118 Type 2 diabetes mellitus with unspecified complications: Secondary | ICD-10-CM | POA: Diagnosis not present

## 2022-10-23 ENCOUNTER — Other Ambulatory Visit: Payer: HMO

## 2022-10-23 DIAGNOSIS — E1165 Type 2 diabetes mellitus with hyperglycemia: Secondary | ICD-10-CM

## 2022-10-23 DIAGNOSIS — I10 Essential (primary) hypertension: Secondary | ICD-10-CM

## 2022-10-23 DIAGNOSIS — E7800 Pure hypercholesterolemia, unspecified: Secondary | ICD-10-CM

## 2022-10-23 DIAGNOSIS — Z125 Encounter for screening for malignant neoplasm of prostate: Secondary | ICD-10-CM | POA: Diagnosis not present

## 2022-10-23 DIAGNOSIS — Z794 Long term (current) use of insulin: Secondary | ICD-10-CM | POA: Diagnosis not present

## 2022-10-24 LAB — CBC WITH DIFFERENTIAL/PLATELET
Basophils Absolute: 0 10*3/uL (ref 0.0–0.2)
Basos: 0 %
EOS (ABSOLUTE): 0.4 10*3/uL (ref 0.0–0.4)
Eos: 8 %
Hematocrit: 45.6 % (ref 37.5–51.0)
Hemoglobin: 15.1 g/dL (ref 13.0–17.7)
Immature Grans (Abs): 0 10*3/uL (ref 0.0–0.1)
Immature Granulocytes: 0 %
Lymphocytes Absolute: 1.8 10*3/uL (ref 0.7–3.1)
Lymphs: 39 %
MCH: 30.9 pg (ref 26.6–33.0)
MCHC: 33.1 g/dL (ref 31.5–35.7)
MCV: 93 fL (ref 79–97)
Monocytes Absolute: 0.5 10*3/uL (ref 0.1–0.9)
Monocytes: 10 %
Neutrophils Absolute: 2 10*3/uL (ref 1.4–7.0)
Neutrophils: 43 %
Platelets: 300 10*3/uL (ref 150–450)
RBC: 4.89 x10E6/uL (ref 4.14–5.80)
RDW: 12.6 % (ref 11.6–15.4)
WBC: 4.7 10*3/uL (ref 3.4–10.8)

## 2022-10-24 LAB — COMPREHENSIVE METABOLIC PANEL
ALT: 17 IU/L (ref 0–44)
AST: 13 IU/L (ref 0–40)
Albumin/Globulin Ratio: 1.6 (ref 1.2–2.2)
Albumin: 3.9 g/dL (ref 3.8–4.8)
Alkaline Phosphatase: 86 IU/L (ref 44–121)
BUN/Creatinine Ratio: 18 (ref 10–24)
BUN: 22 mg/dL (ref 8–27)
Bilirubin Total: 0.3 mg/dL (ref 0.0–1.2)
CO2: 23 mmol/L (ref 20–29)
Calcium: 9 mg/dL (ref 8.6–10.2)
Chloride: 105 mmol/L (ref 96–106)
Creatinine, Ser: 1.23 mg/dL (ref 0.76–1.27)
Globulin, Total: 2.5 g/dL (ref 1.5–4.5)
Glucose: 82 mg/dL (ref 70–99)
Potassium: 4.2 mmol/L (ref 3.5–5.2)
Sodium: 141 mmol/L (ref 134–144)
Total Protein: 6.4 g/dL (ref 6.0–8.5)
eGFR: 61 mL/min/{1.73_m2} (ref >59)

## 2022-10-24 LAB — CARDIOVASCULAR RISK ASSESSMENT

## 2022-10-24 LAB — LIPID PANEL
Chol/HDL Ratio: 4 ratio (ref 0.0–5.0)
Cholesterol, Total: 123 mg/dL (ref 100–199)
HDL: 31 mg/dL — ABNORMAL LOW (ref >39)
LDL Chol Calc (NIH): 73 mg/dL (ref 0–99)
Triglycerides: 102 mg/dL (ref 0–149)
VLDL Cholesterol Cal: 19 mg/dL (ref 5–40)

## 2022-10-24 LAB — PSA: Prostate Specific Ag, Serum: 3.5 ng/mL (ref 0.0–4.0)

## 2022-11-05 ENCOUNTER — Encounter: Payer: Self-pay | Admitting: Physician Assistant

## 2022-11-05 ENCOUNTER — Ambulatory Visit (INDEPENDENT_AMBULATORY_CARE_PROVIDER_SITE_OTHER): Payer: PPO | Admitting: Physician Assistant

## 2022-11-05 VITALS — BP 130/80 | HR 73 | Temp 97.2°F | Resp 18 | Ht 72.0 in | Wt 221.5 lb

## 2022-11-05 DIAGNOSIS — S81802S Unspecified open wound, left lower leg, sequela: Secondary | ICD-10-CM

## 2022-11-05 DIAGNOSIS — R053 Chronic cough: Secondary | ICD-10-CM

## 2022-11-05 MED ORDER — MONTELUKAST SODIUM 10 MG PO TABS: 10.0000 mg | ORAL_TABLET | Freq: Every day | ORAL | 3 refills | Status: DC

## 2022-11-05 MED ORDER — DOXYCYCLINE HYCLATE 100 MG PO TABS: 100.0000 mg | ORAL_TABLET | Freq: Two times a day (BID) | ORAL | 0 refills | Status: DC

## 2022-11-05 NOTE — Progress Notes (Signed)
Acute Office Visit  Subjective:    Patient ID: Mark Stewart, male    DOB: [birth year], 77 y.o.   MRN: [id number]  Chief Complaint  Patient presents with   Wound Check    HPI Patient is in today for recheck of wound on left lower leg Several weeks ago patient fell and ended up with wound on lower leg.  It has slowly been healing but recently scab came off and now draining slightly and tender Denies fever - no redness or inflammation to area  As pt leaving wife mentions that he has had a chronic cough since having COVID which was more than 2 years ago.  Cough is nonproductive and seems to happen more at night Denies dyspnea - does have issues with allergies at times  Past Medical History:  Diagnosis Date   Allergy    Cancer ([place])    skin, hand   Cataract    history of surgery bilaterally   Hypercholesteremia    Hypertension    ICAO (internal carotid artery occlusion)    Spleen absent    patient states had spleen removed in high school   Stroke [facility]) 02/2016    Past Surgical History:  Procedure Laterality Date   EYE SURGERY     HERNIA REPAIR  02/28/2018   HERNIA REPAIR Bilateral 11/2019   PROSTATE SURGERY     SPLENECTOMY      Family History  Problem Relation Age of Onset   Breast cancer Mother    Diabetes Mellitus II Father    Breast cancer Sister    Sleep apnea Neg Hx     Social History   Socioeconomic History   Marital status: Married    Spouse name: graye   Number of children: Not on file   Years of education: Not on file   Highest education level: Not on file  Occupational History   Not on file  Tobacco Use   Smoking status: Former    Packs/day: 1.00    Years: 20.00    Total pack years: 20.00    Types: Cigarettes   Smokeless tobacco: Never   Tobacco comments:    patient states last cigarette 35 years ago  Substance and Sexual Activity   Alcohol use: No   Drug use: No   Sexual activity: Not Currently  Other Topics Concern   Not on file   Social History Narrative   Lives with wife   Right Handed   Drinks 4-5 cups caffeine daily   Social Determinants of Health   Financial Resource Strain: Low Risk  (08/29/2020)   Overall Financial Resource Strain (CARDIA)    Difficulty of Paying Living Expenses: Not hard at all  Food Insecurity: No Food Insecurity (08/29/2020)   Hunger Vital Sign    Worried About Running Out of Food in the Last Year: Never true    Ran Out of Food in the Last Year: Never true  Transportation Needs: No Transportation Needs (08/29/2020)   PRAPARE - Hydrologist (Medical): No    Lack of Transportation (Non-Medical): No  Physical Activity: Not on file  Stress: No Stress Concern Present (08/29/2020)   [place]    Feeling of Stress : Not at all  Social Connections: Moderately Integrated (08/29/2020)   Social Connection and Isolation Panel [NHANES]    Frequency of Communication with Friends and Family: Twice a week    Frequency of Social Gatherings  with Friends and Family: Twice a week    Attends Religious Services: More than 4 times per year    Active Member of Clubs or Organizations: No    Attends Archivist Meetings: Never    Marital Status: Married  Human resources officer Violence: Not At Risk (08/29/2020)   Humiliation, Afraid, Rape, and Kick questionnaire    Fear of Current or Ex-Partner: No    Emotionally Abused: No    Physically Abused: No    Sexually Abused: No     Current Outpatient Medications:    amLODipine (NORVASC) 10 MG tablet, TAKE 1 TABLET BY MOUTH EVERY DAY, Disp: 90 tablet, Rfl: 2   aspirin EC 81 MG tablet, Take 81 mg by mouth daily., Disp: , Rfl:    atorvastatin (LIPITOR) 80 MG tablet, Take 1 tablet (80 mg total) by mouth daily., Disp: 90 tablet, Rfl: 1   buPROPion (WELLBUTRIN XL) 150 MG 24 hr tablet, Take 150 mg by mouth daily., Disp: , Rfl:    carvedilol (COREG) 25 MG tablet,  Take 1 tablet (25 mg total) by mouth 2 (two) times daily with a meal., Disp: 120 tablet, Rfl: 5   Cholecalciferol (VITAMIN D3 PO), Take by mouth daily., Disp: , Rfl:    cloNIDine (CATAPRES - DOSED IN MG/24 HR) 0.2 mg/24hr patch, UNWRAP AND APPLY 1 PATCH TO SKIN WEEKLY, Disp: 12 patch, Rfl: 1   doxycycline (VIBRA-TABS) 100 MG tablet, Take 1 tablet (100 mg total) by mouth 2 (two) times daily., Disp: 20 tablet, Rfl: 0   empagliflozin (JARDIANCE) 25 MG TABS tablet, Take 25 mg by mouth daily. , Disp: , Rfl:    fluticasone (FLONASE) 50 MCG/ACT nasal spray, Place 2 sprays into both nostrils daily as needed for allergies or rhinitis. Reported on 04/26/2016, Disp: 18.2 mL, Rfl: 2   hydrochlorothiazide (HYDRODIURIL) 25 MG tablet, Take 1 tablet (25 mg total) by mouth daily., Disp: 90 tablet, Rfl: 2   Insulin Disposable Pump (OMNIPOD DASH 5 PACK PODS) MISC, , Disp: , Rfl:    minoxidil (LONITEN) 10 MG tablet, TAKE 1/2 TABLET BY MOUTH EVERY DAY, Disp: 45 tablet, Rfl: 2   montelukast (SINGULAIR) 10 MG tablet, Take 1 tablet (10 mg total) by mouth at bedtime., Disp: 30 tablet, Rfl: 3   mupirocin ointment (BACTROBAN) 2 %, Apply 1 Application topically 2 (two) times daily., Disp: 22 g, Rfl: 0   NOVOLOG 100 UNIT/ML injection, USE AS DIRECTED IN INSULIN PUMP. MDD: 100UNITS/DAY, Disp: , Rfl:    omeprazole (PRILOSEC) 40 MG capsule, TAKE 1 CAPSULE BY MOUTH EVERY DAY, Disp: 90 capsule, Rfl: 2   spironolactone (ALDACTONE) 50 MG tablet, TAKE 1 TABLET (50 MG TOTAL) BY MOUTH DAILY. SCHEDULE OFFICE VISIT FOR FUTURE REFILLS., Disp: 30 tablet, Rfl: 0   valsartan (DIOVAN) 320 MG tablet, TAKE 1 TABLET BY MOUTH EVERY DAY, Disp: 90 tablet, Rfl: 2   Allergies  Allergen Reactions   Bactrim [Sulfamethoxazole-Trimethoprim] Hives   Benazepril Cough   Metformin And Related Rash   Penicillins Rash    Has patient had a PCN reaction causing immediate rash, facial/tongue/throat swelling, SOB or lightheadedness with hypotension: yes Has  patient had a PCN reaction causing severe rash involving mucus membranes or skin necrosis: no Has patient had a PCN reaction that required hospitalization: no Has patient had a PCN reaction occurring within the last 10 years: no If all of the above answers are "NO", then may proceed with Cephalosporin use.     CONSTITUTIONAL: Negative for chills, fatigue,  fever,  E/N/T: Negative for ear pain, nasal congestion and sore throat.  CARDIOVASCULAR: Negative for chest pain, dizziness, palpitations and pedal edema.  RESPIRATORY: see HPI MSK: Negative for arthralgias and myalgias.  INTEGUMENTARY: see HPI      Objective:    PHYSICAL EXAM:   VS: BP 130/80   Pulse 73   Temp (!) 97.2 F (36.2 C)   Resp 18   Ht 6' (1.829 m)   Wt 221 lb 8 oz (100.5 kg)   SpO2 95%   BMI 30.04 kg/m   GEN: Well nourished, well developed, in no acute distress  Cardiac: RRR; no murmurs,  Respiratory:  normal respiratory rate and pattern with no distress - normal breath sounds with no rales, rhonchi, wheezes or rubs Skin: small open wound on left lower leg - about size of dime - no cellulitis noted   Wt Readings from Last 3 Encounters:  11/05/22 221 lb 8 oz (100.5 kg)  10/16/22 218 lb 6.4 oz (99.1 kg)  09/24/22 220 lb 6.4 oz (100 kg)    Health Maintenance Due  Topic Date Due   DTaP/Tdap/Td (1 - Tdap) Never done    There are no preventive care reminders to display for this patient.        Assessment & Plan:   Problem List Items Addressed This Visit   None Visit Diagnoses     Open wound of left lower leg, sequela    -  Primary   Relevant Medications   doxycycline (VIBRA-TABS) 100 MG tablet Tegaderm bandage applied and to use every 2-3 days until healed   Chronic cough       Relevant Medications   montelukast (SINGULAIR) 10 MG tablet Follow up if symptoms persist or worsen        Meds ordered this encounter  Medications   doxycycline (VIBRA-TABS) 100 MG tablet    Sig: Take 1 tablet  (100 mg total) by mouth 2 (two) times daily.    Dispense:  20 tablet    Refill:  0    Order Specific Question:   Supervising Provider    Answer:   COX, KIRSTEN [[id number]]   montelukast (SINGULAIR) 10 MG tablet    Sig: Take 1 tablet (10 mg total) by mouth at bedtime.    Dispense:  30 tablet    Refill:  3    Order Specific Question:   Supervising Provider    Answer:   COX, Elnita Maxwell [[id number]]     [place], PA-C

## 2022-11-07 ENCOUNTER — Other Ambulatory Visit: Payer: Self-pay | Admitting: Physician Assistant

## 2022-11-07 MED ORDER — FLUTICASONE PROPIONATE 50 MCG/ACT NA SUSP: 2.0000 | Freq: Every day | NASAL | 2 refills | Status: DC | PRN

## 2022-11-10 ENCOUNTER — Other Ambulatory Visit: Payer: Self-pay | Admitting: Cardiovascular Disease

## 2022-11-12 ENCOUNTER — Telehealth: Payer: Self-pay

## 2022-11-12 NOTE — Telephone Encounter (Signed)
Recommend to use just the bactroban to lesion on lower leg Can stay off antibiotic - recommend claritin or zyrtec to take at night until rash cleared Follow up in office if rash persistent

## 2022-11-12 NOTE — Telephone Encounter (Signed)
Mr. Bala called with possible allergic reaction to his recent antibiotic.  He reports that on Saturday evening he developed a rash on his legs and back, but denies itching.  He stopped the medication that night.  He does feel that his leg was improving and he was concerned about the rash.  He can be reached at [phone].

## 2022-11-13 NOTE — Telephone Encounter (Signed)
Patient made aware, verbalized understanding

## 2022-11-16 ENCOUNTER — Other Ambulatory Visit: Payer: Self-pay | Admitting: Physician Assistant

## 2022-11-16 DIAGNOSIS — G4733 Obstructive sleep apnea (adult) (pediatric): Secondary | ICD-10-CM | POA: Diagnosis not present

## 2022-11-16 DIAGNOSIS — S81802S Unspecified open wound, left lower leg, sequela: Secondary | ICD-10-CM

## 2022-11-19 DIAGNOSIS — E118 Type 2 diabetes mellitus with unspecified complications: Secondary | ICD-10-CM | POA: Diagnosis not present

## 2022-11-30 ENCOUNTER — Telehealth: Payer: Self-pay | Admitting: Cardiovascular Disease

## 2022-11-30 NOTE — Telephone Encounter (Signed)
Patient is returning call. Please advise  

## 2022-11-30 NOTE — Telephone Encounter (Signed)
Returned call to patient to review medications:  Reviewed medications, patient is not taking HCTZ (previously rx by Dr. Henrene Pastor).  Last Rx was 2021.  Reports BP controlled at home.    Confirmed other medications and updated med list.        Current Meds  Medication Sig   amLODipine (NORVASC) 10 MG tablet TAKE 1 TABLET BY MOUTH EVERY DAY   aspirin EC 81 MG tablet Take 81 mg by mouth daily.   atorvastatin (LIPITOR) 80 MG tablet Take 1 tablet (80 mg total) by mouth daily.   buPROPion (WELLBUTRIN XL) 150 MG 24 hr tablet Take 150 mg by mouth daily.   carvedilol (COREG) 25 MG tablet Take 2 tablets (50 mg total) by mouth 2 (two) times daily with a meal. Keep 09/07/22 appt   Cholecalciferol (VITAMIN D3 PO) Take by mouth daily.   cloNIDine (CATAPRES - DOSED IN MG/24 HR) 0.2 mg/24hr patch UNWRAP AND APPLY 1 PATCH TO SKIN WEEKLY   empagliflozin (JARDIANCE) 25 MG TABS tablet Take 25 mg by mouth daily.    fluticasone (FLONASE) 50 MCG/ACT nasal spray Place 2 sprays into both nostrils daily as needed for allergies or rhinitis. Reported on 04/26/2016   hydrochlorothiazide (HYDRODIURIL) 25 MG tablet Take 1 tablet (25 mg total) by mouth daily.   Insulin Disposable Pump (OMNIPOD DASH 5 PACK PODS) MISC     minoxidil (LONITEN) 10 MG tablet TAKE 1/2 TABLET BY MOUTH EVERY DAY   omeprazole (PRILOSEC) 40 MG capsule TAKE 1 CAPSULE BY MOUTH EVERY DAY   spironolactone (ALDACTONE) 50 MG tablet TAKE 1 TABLET (50 MG TOTAL) BY MOUTH DAILY. SCHEDULE OFFICE VISIT FOR FUTURE REFILLS.   valsartan (DIOVAN) 320 MG tablet TAKE 1 TABLET BY MOUTH EVERY DAY

## 2022-11-30 NOTE — Telephone Encounter (Signed)
Left message to call back  

## 2022-11-30 NOTE — Telephone Encounter (Signed)
Pt is calling because he wants to go over his medications. He states hes been prescribed a few and wants to make sure he is taking what he ought to.

## 2022-12-18 DIAGNOSIS — Z4681 Encounter for fitting and adjustment of insulin pump: Secondary | ICD-10-CM | POA: Diagnosis not present

## 2022-12-18 DIAGNOSIS — E1142 Type 2 diabetes mellitus with diabetic polyneuropathy: Secondary | ICD-10-CM | POA: Diagnosis not present

## 2022-12-18 DIAGNOSIS — Z794 Long term (current) use of insulin: Secondary | ICD-10-CM | POA: Diagnosis not present

## 2022-12-18 DIAGNOSIS — Z9641 Presence of insulin pump (external) (internal): Secondary | ICD-10-CM | POA: Diagnosis not present

## 2022-12-18 DIAGNOSIS — Z7984 Long term (current) use of oral hypoglycemic drugs: Secondary | ICD-10-CM | POA: Diagnosis not present

## 2022-12-31 DIAGNOSIS — E118 Type 2 diabetes mellitus with unspecified complications: Secondary | ICD-10-CM | POA: Diagnosis not present

## 2022-12-31 DIAGNOSIS — K5790 Diverticulosis of intestine, part unspecified, without perforation or abscess without bleeding: Secondary | ICD-10-CM | POA: Diagnosis not present

## 2023-01-11 ENCOUNTER — Other Ambulatory Visit: Payer: Self-pay | Admitting: Cardiovascular Disease

## 2023-01-15 ENCOUNTER — Telehealth: Payer: Self-pay

## 2023-01-15 NOTE — Telephone Encounter (Signed)
Patient called reporting blood in urine over the weekend.  Patient stated that his urine was almost black.  Since then he has had a few episodes where he noticed some dry blood in his urine.  Patient denies burning and foul odor.  Patient has seen Dr Salvatore Decent in the past.  Patient would like referral to urology.

## 2023-01-15 NOTE — Telephone Encounter (Signed)
Called patient left detail message and for patient to call office back to make appointment

## 2023-01-17 ENCOUNTER — Ambulatory Visit (INDEPENDENT_AMBULATORY_CARE_PROVIDER_SITE_OTHER): Payer: PPO | Admitting: Podiatry

## 2023-01-17 VITALS — BP 175/93 | HR 67

## 2023-01-17 DIAGNOSIS — M79675 Pain in left toe(s): Secondary | ICD-10-CM | POA: Diagnosis not present

## 2023-01-17 DIAGNOSIS — B351 Tinea unguium: Secondary | ICD-10-CM | POA: Diagnosis not present

## 2023-01-17 DIAGNOSIS — M79674 Pain in right toe(s): Secondary | ICD-10-CM

## 2023-01-17 DIAGNOSIS — E1142 Type 2 diabetes mellitus with diabetic polyneuropathy: Secondary | ICD-10-CM | POA: Diagnosis not present

## 2023-01-17 NOTE — Progress Notes (Signed)
  Subjective:  Patient ID: Mark Stewart, male    DOB: [birth year],  MRN: [id number]  Mark Stewart presents to clinic today for at risk foot care with history of diabetic neuropathy and painful thick toenails that are difficult to trim. Pain interferes with ambulation. Aggravating factors include wearing enclosed shoe gear. Pain is relieved with periodic professional debridement.  Chief Complaint  Patient presents with   Diabetes    DFC BS - DIDN'T CHECK IT, YESTERDAY - 180 A1C - 7.8 LVPCP - 12/2022   New problem(s): None.   PCP is Mark Sofia, Mark Stewart.  Allergies  Allergen Reactions   Bactrim [Sulfamethoxazole-Trimethoprim] Hives   Benazepril Cough   Metformin And Related Rash   Penicillins Rash    Has patient had a PCN reaction causing immediate rash, facial/tongue/throat swelling, SOB or lightheadedness with hypotension: yes Has patient had a PCN reaction causing severe rash involving mucus membranes or skin necrosis: no Has patient had a PCN reaction that required hospitalization: no Has patient had a PCN reaction occurring within the last 10 years: no If all of the above answers are "NO", then may proceed with Cephalosporin use.     Review of Systems: Negative except as noted in the HPI.  Objective:  Vitals:   01/17/23 0956  BP: (!) 175/93  Pulse: 67   Mark Stewart is a pleasant 77 y.o. male WD, WN in NAD. AAO x 3. Vascular Examination: Vascular status intact b/l with palpable pedal pulses. Pedal hair absent b/l. CFT immediate b/l. No pain with calf compression b/l. Skin temperature gradient WNL b/l. Trace edema noted BLE.  Neurological Examination: Pt has subjective symptoms of neuropathy. Protective sensation decreased with 10 gram monofilament b/l. Proprioception intact bilaterally.  Dermatological Examination: Pedal skin with normal turgor, texture and tone b/l. Toenails 1-5 b/l thick, discolored, elongated with subungual debris and pain on dorsal palpation.  No hyperkeratotic lesions noted b/l. No open wounds b/l LE. No interdigital macerations noted b/l LE.   Musculoskeletal Examination: Normal muscle strength 5/5 to all lower extremity muscle groups bilaterally. Hammertoe deformity noted 2-5 b/l. Pes planus deformity noted bilateral LE.Marland Kitchen No pain, crepitus or joint limitation noted with ROM b/l LE.  Patient ambulates independently without assistive aids.  Radiographs: None  Assessment/Plan: 1. Pain due to onychomycosis of toenails of both feet   2. Diabetic polyneuropathy associated with type 2 diabetes mellitus     -Patient was evaluated and treated. All patient's and/or POA's questions/concerns answered on today's visit. -Patient to continue soft, supportive shoe gear daily. -Mycotic toenails 1-5 bilaterally were debrided in length and girth with sterile nail nippers and dremel without incident. -Patient/POA to call should there be question/concern in the interim.   Return in about 3 months (around 04/18/2023).  Freddie Breech, DPM

## 2023-01-18 ENCOUNTER — Encounter: Payer: Self-pay | Admitting: Physician Assistant

## 2023-01-18 ENCOUNTER — Ambulatory Visit (INDEPENDENT_AMBULATORY_CARE_PROVIDER_SITE_OTHER): Payer: PPO | Admitting: Physician Assistant

## 2023-01-18 VITALS — BP 142/60 | HR 72 | Temp 96.8°F | Resp 14 | Ht 72.0 in | Wt 215.0 lb

## 2023-01-18 DIAGNOSIS — N400 Enlarged prostate without lower urinary tract symptoms: Secondary | ICD-10-CM | POA: Diagnosis not present

## 2023-01-18 DIAGNOSIS — R310 Gross hematuria: Secondary | ICD-10-CM

## 2023-01-18 LAB — POCT URINALYSIS DIP (CLINITEK)
Bilirubin, UA: NEGATIVE
Glucose, UA: 500 mg/dL — AB
Ketones, POC UA: NEGATIVE mg/dL
Leukocytes, UA: NEGATIVE
Nitrite, UA: NEGATIVE
Spec Grav, UA: 1.02 (ref 1.010–1.025)
Urobilinogen, UA: 0.2 E.U./dL
pH, UA: 6 (ref 5.0–8.0)

## 2023-01-18 MED ORDER — CIPROFLOXACIN HCL 250 MG PO TABS
250.0000 mg | ORAL_TABLET | Freq: Two times a day (BID) | ORAL | 0 refills | Status: AC
Start: 2023-01-18 — End: 2023-01-21

## 2023-01-18 NOTE — Progress Notes (Signed)
Acute Office Visit  Subjective:    Patient ID: Mark Stewart, male    DOB: 1946-04-14, 77 y.o.   MRN: [id number]  Chief Complaint  Patient presents with   Hematuria         HPI: Patient is in today for complaints of gross hematuria.  He states 6 days ago he noted dark black/red blood in urine and then another episode on Tuesday.  He denies urine frequency, urgency, dysuria, nocturia.  He gives no history of kidney stones.  He is having no abdominal pain. Pt states he has had some lower back pain but thinks that was due to working out in yard recently He states years ago he saw urology for BPH and had to have his 'prostate scraped' but has not been back to urology -   Pt with history of HTN - bp slightly elevated today but states he forgot to take his bp med today and yesterday  Past Medical History:  Diagnosis Date   Allergy    Cancer    skin, hand   Cataract    history of surgery bilaterally   Hypercholesteremia    Hypertension    ICAO (internal carotid artery occlusion)    Spleen absent    patient states had spleen removed in high school   Stroke 02/2016    Past Surgical History:  Procedure Laterality Date   EYE SURGERY     HERNIA REPAIR  02/28/2018   HERNIA REPAIR Bilateral 11/2019   PROSTATE SURGERY     SPLENECTOMY      Family History  Problem Relation Age of Onset   Breast cancer Mother    Diabetes Mellitus II Father    Breast cancer Sister    Sleep apnea Neg Hx     Social History   Socioeconomic History   Marital status: Married    Spouse name: graye   Number of children: Not on file   Years of education: Not on file   Highest education level: Not on file  Occupational History   Not on file  Tobacco Use   Smoking status: Former    Packs/day: 1.00    Years: 20.00    Additional pack years: 0.00    Total pack years: 20.00    Types: Cigarettes   Smokeless tobacco: Never   Tobacco comments:    patient states last cigarette 35 years ago   Substance and Sexual Activity   Alcohol use: No   Drug use: No   Sexual activity: Not Currently  Other Topics Concern   Not on file  Social History Narrative   Lives with wife   Right Handed   Drinks 4-5 cups caffeine daily   Social Determinants of Health   Financial Resource Strain: Low Risk  (08/29/2020)   Overall Financial Resource Strain (CARDIA)    Difficulty of Paying Living Expenses: Not hard at all  Food Insecurity: No Food Insecurity (08/29/2020)   Hunger Vital Sign    Worried About Running Out of Food in the Last Year: Never true    Ran Out of Food in the Last Year: Never true  Transportation Needs: No Transportation Needs (08/29/2020)   PRAPARE - Administrator, Civil Service (Medical): No    Lack of Transportation (Non-Medical): No  Physical Activity: Not on file  Stress: No Stress Concern Present (08/29/2020)   [employer] of Occupational Health - Occupational Stress Questionnaire    Feeling of Stress : Not at all  Social Connections: Moderately Integrated (08/29/2020)   Social Connection and Isolation Panel [NHANES]    Frequency of Communication with Friends and Family: Twice a week    Frequency of Social Gatherings with Friends and Family: Twice a week    Attends Religious Services: More than 4 times per year    Active Member of [employer] or Organizations: No    Attends Banker Meetings: Never    Marital Status: Married  Catering manager Violence: Not At Risk (08/29/2020)   Humiliation, Afraid, Rape, and Kick questionnaire    Fear of Current or Ex-Partner: No    Emotionally Abused: No    Physically Abused: No    Sexually Abused: No    Outpatient Medications Prior to Visit  Medication Sig Dispense Refill   amLODipine (NORVASC) 10 MG tablet TAKE 1 TABLET BY MOUTH EVERY DAY 90 tablet 2   aspirin EC 81 MG tablet Take 81 mg by mouth daily.     atorvastatin (LIPITOR) 80 MG tablet Take 1 tablet (80 mg total) by mouth daily. 90 tablet  1   buPROPion (WELLBUTRIN XL) 150 MG 24 hr tablet Take 150 mg by mouth daily.     carvedilol (COREG) 25 MG tablet Take 1 tablet (25 mg total) by mouth 2 (two) times daily with a meal. 120 tablet 5   Cholecalciferol (VITAMIN D3 PO) Take by mouth daily.     cloNIDine (CATAPRES - DOSED IN MG/24 HR) 0.2 mg/24hr patch UNWRAP AND APPLY 1 PATCH TO SKIN WEEKLY 12 patch 1   empagliflozin (JARDIANCE) 25 MG TABS tablet Take 25 mg by mouth daily.      fluticasone (FLONASE) 50 MCG/ACT nasal spray Place 2 sprays into both nostrils daily as needed for allergies or rhinitis. Reported on 04/26/2016 18.2 mL 2   Insulin Disposable Pump (OMNIPOD DASH 5 PACK PODS) MISC      minoxidil (LONITEN) 10 MG tablet TAKE 1/2 TABLET BY MOUTH EVERY DAY 45 tablet 2   montelukast (SINGULAIR) 10 MG tablet Take 1 tablet (10 mg total) by mouth at bedtime. 30 tablet 3   mupirocin ointment (BACTROBAN) 2 % Apply 1 Application topically 2 (two) times daily. 22 g 0   NOVOLOG 100 UNIT/ML injection USE AS DIRECTED IN INSULIN PUMP. MDD: 100UNITS/DAY     omeprazole (PRILOSEC) 40 MG capsule TAKE 1 CAPSULE BY MOUTH EVERY DAY 90 capsule 2   spironolactone (ALDACTONE) 50 MG tablet TAKE 1 TABLET (50 MG TOTAL) BY MOUTH DAILY. SCHEDULE OFFICE VISIT FOR FUTURE REFILLS. 90 tablet 3   valsartan (DIOVAN) 320 MG tablet TAKE 1 TABLET BY MOUTH EVERY DAY 90 tablet 2   doxycycline (VIBRA-TABS) 100 MG tablet Take 1 tablet (100 mg total) by mouth 2 (two) times daily. 20 tablet 0   No facility-administered medications prior to visit.    Allergies  Allergen Reactions   Bactrim [Sulfamethoxazole-Trimethoprim] Hives   Benazepril Cough   Metformin And Related Rash   Penicillins Rash    Has patient had a PCN reaction causing immediate rash, facial/tongue/throat swelling, SOB or lightheadedness with hypotension: yes Has patient had a PCN reaction causing severe rash involving mucus membranes or skin necrosis: no Has patient had a PCN reaction that required  hospitalization: no Has patient had a PCN reaction occurring within the last 10 years: no If all of the above answers are "NO", then may proceed with Cephalosporin use.     Review of Systems CONSTITUTIONAL: Negative for chills, fatigue, fever, CARDIOVASCULAR: Negative for chest pain,  RESPIRATORY: Negative for recent cough and dyspnea.  GASTROINTESTINAL: Negative for abdominal pain, acid reflux symptoms, constipation, diarrhea, nausea and vomiting.  GU - see HPI      Objective:    PHYSICAL EXAM:   VS: BP (!) 142/60   Pulse 72   Temp (!) 96.8 F (36 C)   Resp 14   Ht 6' (1.829 m)   Wt 215 lb (97.5 kg)   BMI 29.16 kg/m   GEN: Well nourished, well developed, in no acute distress  Cardiac: RRR; no murmurs, Respiratory:  normal respiratory rate and pattern with no distress - normal breath sounds with no rales, rhonchi, wheezes or rubs GI: normal bowel sounds, no masses or tenderness  Office Visit on 01/18/2023  Component Date Value Ref Range Status   Color, UA 01/18/2023 brown (A)  yellow Final   Clarity, UA 01/18/2023 hazy (A)  clear Final   Glucose, UA 01/18/2023 =500 (A)  negative mg/dL Final   Bilirubin, UA 16/07/9603 negative  negative Final   Ketones, POC UA 01/18/2023 negative  negative mg/dL Final   Spec Grav, UA 54/06/8118 1.020  1.010 - 1.025 Final   Blood, UA 01/18/2023 moderate (A)  negative Final   pH, UA 01/18/2023 6.0  5.0 - 8.0 Final   POC PROTEIN,UA 01/18/2023 trace  negative, trace Final   Urobilinogen, UA 01/18/2023 0.2  0.2 or 1.0 E.U./dL Final   Nitrite, UA 14/78/2956 Negative  Negative Final   Leukocytes, UA 01/18/2023 Negative  Negative Final   b   Health Maintenance Due  Topic Date Due   DTaP/Tdap/Td (1 - Tdap) Never done    There are no preventive care reminders to display for this patient.   No results found for: "TSH" Lab Results  Component Value Date   WBC 4.7 10/23/2022   HGB 15.1 10/23/2022   HCT 45.6 10/23/2022   MCV 93  10/23/2022   PLT 300 10/23/2022   Lab Results  Component Value Date   NA 141 10/23/2022   K 4.2 10/23/2022   CO2 23 10/23/2022   GLUCOSE 82 10/23/2022   BUN 22 10/23/2022   CREATININE 1.23 10/23/2022   BILITOT 0.3 10/23/2022   ALKPHOS 86 10/23/2022   AST 13 10/23/2022   ALT 17 10/23/2022   PROT 6.4 10/23/2022   ALBUMIN 3.9 10/23/2022   CALCIUM 9.0 10/23/2022   ANIONGAP 8 06/16/2020   EGFR 61 10/23/2022   Lab Results  Component Value Date   CHOL 123 10/23/2022   Lab Results  Component Value Date   HDL 31 (L) 10/23/2022   Lab Results  Component Value Date   LDLCALC 73 10/23/2022   Lab Results  Component Value Date   TRIG 102 10/23/2022   Lab Results  Component Value Date   CHOLHDL 4.0 10/23/2022   Lab Results  Component Value Date   HGBA1C 7.4 09/17/2022   HGBA1C 7.4 09/17/2022       Assessment & Plan:  Gross hematuria -     Urine Culture -     POCT URINALYSIS DIP (CLINITEK) -     PSA -     Comprehensive metabolic panel -     Ambulatory referral to Urology  History of  Benign prostatic hyperplasia - -     PSA -     Comprehensive metabolic panel -     Ambulatory referral to Urology -     Ciprofloxacin HCl; Take 1 tablet (250 mg total) by mouth 2 (two) times daily for  3 days.  Dispense: 6 tablet; Refill: 0 - covered empirically until urine culture back (since he has painless hematuria and low back pain)     Meds ordered this encounter  Medications   ciprofloxacin (CIPRO) 250 MG tablet    Sig: Take 1 tablet (250 mg total) by mouth 2 (two) times daily for 3 days.    Dispense:  6 tablet    Refill:  0    Order Specific Question:   Supervising Provider    AnswerCorey Harold    Orders Placed This Encounter  Procedures   Urine Culture   PSA   Comprehensive metabolic panel   Ambulatory referral to Urology   POCT URINALYSIS DIP (CLINITEK)     Follow-up: Return if symptoms worsen or fail to improve.  An After Visit Summary was  printed and given to the patient.  Jettie Pagan [facility] [phone]

## 2023-01-19 LAB — COMPREHENSIVE METABOLIC PANEL
ALT: 18 IU/L (ref 0–44)
AST: 13 IU/L (ref 0–40)
Albumin/Globulin Ratio: 1.4 (ref 1.2–2.2)
Albumin: 3.9 g/dL (ref 3.8–4.8)
Alkaline Phosphatase: 88 IU/L (ref 44–121)
BUN/Creatinine Ratio: 15 (ref 10–24)
BUN: 19 mg/dL (ref 8–27)
Bilirubin Total: 0.5 mg/dL (ref 0.0–1.2)
CO2: 21 mmol/L (ref 20–29)
Calcium: 9.4 mg/dL (ref 8.6–10.2)
Chloride: 102 mmol/L (ref 96–106)
Creatinine, Ser: 1.23 mg/dL (ref 0.76–1.27)
Globulin, Total: 2.7 g/dL (ref 1.5–4.5)
Glucose: 138 mg/dL — ABNORMAL HIGH (ref 70–99)
Potassium: 4.6 mmol/L (ref 3.5–5.2)
Sodium: 137 mmol/L (ref 134–144)
Total Protein: 6.6 g/dL (ref 6.0–8.5)
eGFR: 61 mL/min/{1.73_m2} (ref >59)

## 2023-01-19 LAB — PSA: Prostate Specific Ag, Serum: 2 ng/mL (ref 0.0–4.0)

## 2023-01-20 ENCOUNTER — Encounter: Payer: Self-pay | Admitting: Podiatry

## 2023-01-21 LAB — URINE CULTURE: Organism ID, Bacteria: NO GROWTH

## 2023-01-30 DIAGNOSIS — E118 Type 2 diabetes mellitus with unspecified complications: Secondary | ICD-10-CM | POA: Diagnosis not present

## 2023-02-01 ENCOUNTER — Other Ambulatory Visit: Payer: Self-pay | Admitting: Physician Assistant

## 2023-02-01 DIAGNOSIS — N401 Enlarged prostate with lower urinary tract symptoms: Secondary | ICD-10-CM | POA: Diagnosis not present

## 2023-02-01 DIAGNOSIS — Z79899 Other long term (current) drug therapy: Secondary | ICD-10-CM | POA: Diagnosis not present

## 2023-02-01 DIAGNOSIS — R310 Gross hematuria: Secondary | ICD-10-CM | POA: Diagnosis not present

## 2023-02-01 DIAGNOSIS — R053 Chronic cough: Secondary | ICD-10-CM

## 2023-02-03 ENCOUNTER — Other Ambulatory Visit: Payer: Self-pay | Admitting: Cardiovascular Disease

## 2023-02-07 ENCOUNTER — Other Ambulatory Visit: Payer: Self-pay | Admitting: Physician Assistant

## 2023-02-08 DIAGNOSIS — R310 Gross hematuria: Secondary | ICD-10-CM | POA: Diagnosis not present

## 2023-02-08 DIAGNOSIS — N200 Calculus of kidney: Secondary | ICD-10-CM | POA: Diagnosis not present

## 2023-02-08 DIAGNOSIS — N400 Enlarged prostate without lower urinary tract symptoms: Secondary | ICD-10-CM | POA: Diagnosis not present

## 2023-02-14 DIAGNOSIS — Z794 Long term (current) use of insulin: Secondary | ICD-10-CM | POA: Diagnosis not present

## 2023-02-14 DIAGNOSIS — E118 Type 2 diabetes mellitus with unspecified complications: Secondary | ICD-10-CM | POA: Diagnosis not present

## 2023-02-18 DIAGNOSIS — G4733 Obstructive sleep apnea (adult) (pediatric): Secondary | ICD-10-CM | POA: Diagnosis not present

## 2023-03-01 DIAGNOSIS — E118 Type 2 diabetes mellitus with unspecified complications: Secondary | ICD-10-CM | POA: Diagnosis not present

## 2023-03-05 DIAGNOSIS — E119 Type 2 diabetes mellitus without complications: Secondary | ICD-10-CM | POA: Diagnosis not present

## 2023-03-05 DIAGNOSIS — R310 Gross hematuria: Secondary | ICD-10-CM | POA: Diagnosis not present

## 2023-03-05 DIAGNOSIS — N401 Enlarged prostate with lower urinary tract symptoms: Secondary | ICD-10-CM | POA: Diagnosis not present

## 2023-03-06 ENCOUNTER — Ambulatory Visit: Payer: PPO | Admitting: Podiatry

## 2023-03-06 DIAGNOSIS — L600 Ingrowing nail: Secondary | ICD-10-CM | POA: Diagnosis not present

## 2023-03-07 NOTE — Progress Notes (Signed)
Chief Complaint  Patient presents with   Foot Problem    Bruised left 2nd toe x 2 weeks. Patient denies any pain at this time. No known injuries. Patient has been applying mupirocin ointment to area.     HPI: 77 y.o. male presenting today with pain, redness, tenderness to the left second toe near the medial nail margin.  Denies drainage.  Not sure if he stepped the toe.  States it feels bruised.  He is wondering if he has an ingrown toenail.  He is diabetic with peripheral neuropathy.  Past Medical History:  Diagnosis Date   Allergy    Cancer (HCC)    skin, hand   Cataract    history of surgery bilaterally   Hypercholesteremia    Hypertension    ICAO (internal carotid artery occlusion)    Spleen absent    patient states had spleen removed in high school   Stroke [facility]) 02/2016    Past Surgical History:  Procedure Laterality Date   EYE SURGERY     HERNIA REPAIR  02/28/2018   HERNIA REPAIR Bilateral 11/2019   PROSTATE SURGERY     SPLENECTOMY      Allergies  Allergen Reactions   Bactrim [Sulfamethoxazole-Trimethoprim] Hives   Benazepril Cough   Metformin And Related Rash   Penicillins Rash    Has patient had a PCN reaction causing immediate rash, facial/tongue/throat swelling, SOB or lightheadedness with hypotension: yes Has patient had a PCN reaction causing severe rash involving mucus membranes or skin necrosis: no Has patient had a PCN reaction that required hospitalization: no Has patient had a PCN reaction occurring within the last 10 years: no If all of the above answers are "NO", then may proceed with Cephalosporin use.       Physical Exam:  General: The patient is alert and oriented x3 in no acute distress.  Dermatology: Skin is warm, dry and supple bilateral lower extremities. Interspaces are clear of maceration and debris.  There is mild erythema and edema to medial left second toe along nail margin.  On palpation it feels as though there is some  incurvated nail down in the groove.  No drainage is noted.  No evidence of dried blood.  No ecchymosis.  Vascular: Palpable pedal pulses bilaterally. Capillary refill within normal limits.  No appreciable edema.  No erythema or calor.  Neurological: Light touch sensation diminished bilateral feet.   Assessment/Plan of Care: 1. Ingrown toenail     Discussed clinical findings with patient today.  The left second toenail medial nail border was cut back 75% to the eponychium.  He tolerated this well.  No bleeding was noted.  There is mild debris buildup underneath this nail portion once it was removed.  This was scraped away with a curette.  No purulence was noted.  Immediate relief was noted.  Antibiotic ointment and a Band-Aid were applied.  Patient was advised to perform Epsom salt soaks over the next 2 to 3 days for 15 to 20 minutes at a time.  He can apply a small amount of antibiotic ointment to the area.  He is to call within the next week if the redness does not completely resolve or if the pain worsens.   Clerance Lav, DPM, FACFAS [facility]     [address]  [place], [place] [address]                Office [phone]  Fax [phone]

## 2023-03-20 DIAGNOSIS — Z794 Long term (current) use of insulin: Secondary | ICD-10-CM | POA: Diagnosis not present

## 2023-03-20 DIAGNOSIS — E119 Type 2 diabetes mellitus without complications: Secondary | ICD-10-CM | POA: Diagnosis not present

## 2023-03-20 DIAGNOSIS — Z86010 Personal history of colonic polyps: Secondary | ICD-10-CM | POA: Diagnosis not present

## 2023-03-20 DIAGNOSIS — Z87891 Personal history of nicotine dependence: Secondary | ICD-10-CM | POA: Diagnosis not present

## 2023-03-20 DIAGNOSIS — Z1211 Encounter for screening for malignant neoplasm of colon: Secondary | ICD-10-CM | POA: Diagnosis not present

## 2023-03-20 DIAGNOSIS — Z09 Encounter for follow-up examination after completed treatment for conditions other than malignant neoplasm: Secondary | ICD-10-CM | POA: Diagnosis not present

## 2023-03-20 DIAGNOSIS — I10 Essential (primary) hypertension: Secondary | ICD-10-CM | POA: Diagnosis not present

## 2023-03-20 DIAGNOSIS — Z7984 Long term (current) use of oral hypoglycemic drugs: Secondary | ICD-10-CM | POA: Diagnosis not present

## 2023-03-22 DIAGNOSIS — Z9641 Presence of insulin pump (external) (internal): Secondary | ICD-10-CM | POA: Diagnosis not present

## 2023-03-22 DIAGNOSIS — E1142 Type 2 diabetes mellitus with diabetic polyneuropathy: Secondary | ICD-10-CM | POA: Diagnosis not present

## 2023-03-22 DIAGNOSIS — E118 Type 2 diabetes mellitus with unspecified complications: Secondary | ICD-10-CM | POA: Diagnosis not present

## 2023-03-22 DIAGNOSIS — Z794 Long term (current) use of insulin: Secondary | ICD-10-CM | POA: Diagnosis not present

## 2023-03-23 ENCOUNTER — Other Ambulatory Visit: Payer: Self-pay

## 2023-03-23 DIAGNOSIS — J4489 Other specified chronic obstructive pulmonary disease: Secondary | ICD-10-CM

## 2023-03-24 MED ORDER — AMLODIPINE BESYLATE 10 MG PO TABS
10.0000 mg | ORAL_TABLET | Freq: Every day | ORAL | 0 refills | Status: DC
Start: 2023-03-24 — End: 2023-06-24

## 2023-03-25 DIAGNOSIS — L570 Actinic keratosis: Secondary | ICD-10-CM | POA: Diagnosis not present

## 2023-03-25 DIAGNOSIS — C44222 Squamous cell carcinoma of skin of right ear and external auricular canal: Secondary | ICD-10-CM | POA: Diagnosis not present

## 2023-03-25 DIAGNOSIS — L820 Inflamed seborrheic keratosis: Secondary | ICD-10-CM | POA: Diagnosis not present

## 2023-03-25 DIAGNOSIS — Z85820 Personal history of malignant melanoma of skin: Secondary | ICD-10-CM | POA: Diagnosis not present

## 2023-03-28 ENCOUNTER — Encounter: Payer: Self-pay | Admitting: Physician Assistant

## 2023-03-28 ENCOUNTER — Ambulatory Visit (INDEPENDENT_AMBULATORY_CARE_PROVIDER_SITE_OTHER): Payer: PPO | Admitting: Physician Assistant

## 2023-03-28 VITALS — BP 122/68 | HR 83 | Temp 97.4°F | Ht 74.0 in | Wt 217.0 lb

## 2023-03-28 DIAGNOSIS — G8929 Other chronic pain: Secondary | ICD-10-CM | POA: Diagnosis not present

## 2023-03-28 DIAGNOSIS — R053 Chronic cough: Secondary | ICD-10-CM | POA: Diagnosis not present

## 2023-03-28 DIAGNOSIS — M5442 Lumbago with sciatica, left side: Secondary | ICD-10-CM | POA: Diagnosis not present

## 2023-03-28 NOTE — Progress Notes (Signed)
Acute Office Visit  Subjective:    Patient ID: Mark Stewart, male    DOB: 1946/04/07, 77 y.o.   MRN: [id number]  Chief Complaint  Patient presents with   Back Pain    HPI: Patient is in today for Pain  He reports new onset lower pain. was not an injury that may have caused the pain. The pain started  2 weeks ago and is improving. The pain does radiate left leg . The pain is described as tingling, is moderate in intensity, occurring intermittently.  Aggravating factors: going from sitting to standing position  Relieving factors: sitting.  He has tried acetaminophen with mild relief. Patient is unable to fully replicate the pain, but states nothing in particular makes it completely better.  Patient reports that he has had a chronic cough for over a year. He states that it will come and go in severity, but it is worse at night when he is laying down. He denies coughing up anything productive   Past Medical History:  Diagnosis Date   Allergy    Cancer (HCC)    skin, hand   Cataract    history of surgery bilaterally   Hypercholesteremia    Hypertension    ICAO (internal carotid artery occlusion)    Spleen absent    patient states had spleen removed in high school   Stroke [facility]) 02/2016    Past Surgical History:  Procedure Laterality Date   EYE SURGERY     HERNIA REPAIR  02/28/2018   HERNIA REPAIR Bilateral 11/2019   PROSTATE SURGERY     SPLENECTOMY      Family History  Problem Relation Age of Onset   Breast cancer Mother    Diabetes Mellitus II Father    Breast cancer Sister    Sleep apnea Neg Hx     Social History   Socioeconomic History   Marital status: Married    Spouse name: graye   Number of children: Not on file   Years of education: Not on file   Highest education level: Not on file  Occupational History   Not on file  Tobacco Use   Smoking status: Former    Packs/day: 1.00    Years: 20.00    Additional pack years: 0.00    Total pack years:  20.00    Types: Cigarettes   Smokeless tobacco: Never   Tobacco comments:    patient states last cigarette 35 years ago  Substance and Sexual Activity   Alcohol use: No   Drug use: No   Sexual activity: Not Currently  Other Topics Concern   Not on file  Social History Narrative   Lives with wife   Right Handed   Drinks 4-5 cups caffeine daily   Social Determinants of Health   Financial Resource Strain: Low Risk  (08/29/2020)   Overall Financial Resource Strain (CARDIA)    Difficulty of Paying Living Expenses: Not hard at all  Food Insecurity: No Food Insecurity (08/29/2020)   Hunger Vital Sign    Worried About Running Out of Food in the Last Year: Never true    Ran Out of Food in the Last Year: Never true  Transportation Needs: No Transportation Needs (08/29/2020)   PRAPARE - Administrator, Civil Service (Medical): No    Lack of Transportation (Non-Medical): No  Physical Activity: Inactive (03/28/2023)   Exercise Vital Sign    Days of Exercise per Week: 0 days    Minutes  of Exercise per Session: 0 min  Stress: No Stress Concern Present (08/29/2020)   [employer] of Occupational Health - Occupational Stress Questionnaire    Feeling of Stress : Not at all  Social Connections: Moderately Integrated (08/29/2020)   Social Connection and Isolation Panel [NHANES]    Frequency of Communication with Friends and Family: Twice a week    Frequency of Social Gatherings with Friends and Family: Twice a week    Attends Religious Services: More than 4 times per year    Active Member of [employer] or Organizations: No    Attends Banker Meetings: Never    Marital Status: Married  Catering manager Violence: Not At Risk (08/29/2020)   Humiliation, Afraid, Rape, and Kick questionnaire    Fear of Current or Ex-Partner: No    Emotionally Abused: No    Physically Abused: No    Sexually Abused: No    Outpatient Medications Prior to Visit  Medication Sig Dispense  Refill   amLODipine (NORVASC) 10 MG tablet Take 1 tablet (10 mg total) by mouth daily. 90 tablet 0   aspirin EC 81 MG tablet Take 81 mg by mouth daily.     atorvastatin (LIPITOR) 80 MG tablet Take 1 tablet (80 mg total) by mouth daily. 90 tablet 1   buPROPion (WELLBUTRIN XL) 150 MG 24 hr tablet Take 150 mg by mouth daily.     carvedilol (COREG) 25 MG tablet Take 1 tablet (25 mg total) by mouth 2 (two) times daily with a meal. 120 tablet 5   Cholecalciferol (VITAMIN D3 PO) Take by mouth daily.     cloNIDine (CATAPRES - DOSED IN MG/24 HR) 0.2 mg/24hr patch UNWRAP AND APPLY 1 PATCH TO SKIN WEEKLY 12 patch 1   empagliflozin (JARDIANCE) 25 MG TABS tablet Take 25 mg by mouth daily.      fluticasone (FLONASE) 50 MCG/ACT nasal spray PLACE 2 SPRAYS INTO BOTH NOSTRILS DAILY AS NEEDED FOR ALLERGIES OR RHINITIS. REPORTED ON 04/26/2016 48 mL 1   Insulin Disposable Pump (OMNIPOD DASH 5 PACK PODS) MISC      minoxidil (LONITEN) 10 MG tablet TAKE 1/2 TABLET BY MOUTH EVERY DAY 45 tablet 2   montelukast (SINGULAIR) 10 MG tablet TAKE 1 TABLET BY MOUTH EVERYDAY AT BEDTIME 90 tablet 1   mupirocin ointment (BACTROBAN) 2 % Apply 1 Application topically 2 (two) times daily. 22 g 0   NOVOLOG 100 UNIT/ML injection USE AS DIRECTED IN INSULIN PUMP. MDD: 100UNITS/DAY     omeprazole (PRILOSEC) 40 MG capsule TAKE 1 CAPSULE BY MOUTH EVERY DAY 90 capsule 2   spironolactone (ALDACTONE) 50 MG tablet TAKE 1 TABLET (50 MG TOTAL) BY MOUTH DAILY. SCHEDULE OFFICE VISIT FOR FUTURE REFILLS. 90 tablet 3   valsartan (DIOVAN) 320 MG tablet TAKE 1 TABLET BY MOUTH EVERY DAY 90 tablet 2   No facility-administered medications prior to visit.    Allergies  Allergen Reactions   Bactrim [Sulfamethoxazole-Trimethoprim] Hives   Benazepril Cough   Metformin And Related Rash   Penicillins Rash    Has patient had a PCN reaction causing immediate rash, facial/tongue/throat swelling, SOB or lightheadedness with hypotension: yes Has patient had a  PCN reaction causing severe rash involving mucus membranes or skin necrosis: no Has patient had a PCN reaction that required hospitalization: no Has patient had a PCN reaction occurring within the last 10 years: no If all of the above answers are "NO", then may proceed with Cephalosporin use.     Review of  Systems  Constitutional:  Negative for diaphoresis and fever.  Respiratory:  Negative for cough and shortness of breath.   Cardiovascular:  Negative for chest pain.  Musculoskeletal:  Positive for back pain. Negative for arthralgias.       Objective:        03/28/2023    2:57 PM 01/18/2023   12:19 PM 01/18/2023   10:40 AM  Vitals with BMI  Height 6\' 2"   6\' 0"   Weight 217 lbs  215 lbs  BMI 27.85  29.15  Systolic 122 142 161  Diastolic 68 60 60  Pulse 83  72    No data found.   Physical Exam Vitals reviewed.  Constitutional:      Appearance: Normal appearance. He is normal weight.  Cardiovascular:     Rate and Rhythm: Normal rate and regular rhythm.     Heart sounds: No murmur heard. Pulmonary:     Effort: Pulmonary effort is normal.     Breath sounds: Wheezing present.  Abdominal:     General: Abdomen is flat. Bowel sounds are normal.     Palpations: Abdomen is soft.     Tenderness: There is no abdominal tenderness.  Musculoskeletal:     Cervical back: Normal.     Thoracic back: Normal.     Lumbar back: Normal.  Neurological:     Mental Status: He is alert and oriented to person, place, and time.  Psychiatric:        Mood and Affect: Mood normal.        Behavior: Behavior normal.     Health Maintenance Due  Topic Date Due   DTaP/Tdap/Td (1 - Tdap) Never done   HEMOGLOBIN A1C  03/19/2023    There are no preventive care reminders to display for this patient.   No results found for: "TSH" Lab Results  Component Value Date   WBC 4.7 10/23/2022   HGB 15.1 10/23/2022   HCT 45.6 10/23/2022   MCV 93 10/23/2022   PLT 300 10/23/2022   Lab Results   Component Value Date   NA 137 01/18/2023   K 4.6 01/18/2023   CO2 21 01/18/2023   GLUCOSE 138 (H) 01/18/2023   BUN 19 01/18/2023   CREATININE 1.23 01/18/2023   BILITOT 0.5 01/18/2023   ALKPHOS 88 01/18/2023   AST 13 01/18/2023   ALT 18 01/18/2023   PROT 6.6 01/18/2023   ALBUMIN 3.9 01/18/2023   CALCIUM 9.4 01/18/2023   ANIONGAP 8 06/16/2020   EGFR 61 01/18/2023   Lab Results  Component Value Date   CHOL 123 10/23/2022   Lab Results  Component Value Date   HDL 31 (L) 10/23/2022   Lab Results  Component Value Date   LDLCALC 73 10/23/2022   Lab Results  Component Value Date   TRIG 102 10/23/2022   Lab Results  Component Value Date   CHOLHDL 4.0 10/23/2022   Lab Results  Component Value Date   HGBA1C 7.4 09/17/2022   HGBA1C 7.4 09/17/2022       Assessment & Plan:  Chronic cough Assessment & Plan: Patient admits to a cough being on and off over the past year and a half Had issue since covid Sent for x-ray to rule out pneumonia  Orders: -     DG Chest 2 View; Future  Chronic left-sided low back pain with left-sided sciatica Assessment & Plan: Continue working on stretching  Add a heating pad to help with pain for 10-15 minutes a day 3  times a week      No orders of the defined types were placed in this encounter.   Orders Placed This Encounter  Procedures   DG Chest 2 View     Follow-up: Return if symptoms worsen or fail to improve.  An After Visit Summary was printed and given to the patient.  Langley Gauss, [place] [facility] [phone]

## 2023-03-29 DIAGNOSIS — R918 Other nonspecific abnormal finding of lung field: Secondary | ICD-10-CM | POA: Diagnosis not present

## 2023-03-29 DIAGNOSIS — R053 Chronic cough: Secondary | ICD-10-CM | POA: Insufficient documentation

## 2023-03-29 DIAGNOSIS — R059 Cough, unspecified: Secondary | ICD-10-CM | POA: Diagnosis not present

## 2023-03-29 NOTE — Assessment & Plan Note (Signed)
Continue working on stretching  Add a heating pad to help with pain for 10-15 minutes a day 3 times a week

## 2023-03-29 NOTE — Assessment & Plan Note (Signed)
Patient admits to a cough being on and off over the past year and a half Had issue since covid Sent for x-ray to rule out pneumonia

## 2023-04-01 DIAGNOSIS — E118 Type 2 diabetes mellitus with unspecified complications: Secondary | ICD-10-CM | POA: Diagnosis not present

## 2023-04-02 ENCOUNTER — Telehealth: Payer: Self-pay

## 2023-04-02 NOTE — Telephone Encounter (Signed)
Patient called wanted to check on chest x-ray results he got done on 03/28/23.  Called patient back made him aware that we looked on [facility] website and x-ray has not been resulted, office will call him when we receive the results.

## 2023-04-03 ENCOUNTER — Encounter: Payer: Self-pay | Admitting: Physician Assistant

## 2023-04-17 DIAGNOSIS — N401 Enlarged prostate with lower urinary tract symptoms: Secondary | ICD-10-CM | POA: Diagnosis not present

## 2023-04-17 DIAGNOSIS — R310 Gross hematuria: Secondary | ICD-10-CM | POA: Diagnosis not present

## 2023-04-17 DIAGNOSIS — D3502 Benign neoplasm of left adrenal gland: Secondary | ICD-10-CM | POA: Diagnosis not present

## 2023-04-17 DIAGNOSIS — N200 Calculus of kidney: Secondary | ICD-10-CM | POA: Diagnosis not present

## 2023-04-18 ENCOUNTER — Ambulatory Visit: Payer: HMO | Admitting: Physician Assistant

## 2023-04-19 ENCOUNTER — Ambulatory Visit: Payer: PPO | Admitting: Podiatry

## 2023-04-22 ENCOUNTER — Encounter: Payer: Self-pay | Admitting: Physician Assistant

## 2023-04-22 ENCOUNTER — Ambulatory Visit: Payer: PPO | Admitting: Physician Assistant

## 2023-04-22 VITALS — BP 120/66 | HR 70 | Temp 97.5°F | Ht 74.0 in | Wt 219.4 lb

## 2023-04-22 DIAGNOSIS — Z794 Long term (current) use of insulin: Secondary | ICD-10-CM

## 2023-04-22 DIAGNOSIS — N400 Enlarged prostate without lower urinary tract symptoms: Secondary | ICD-10-CM | POA: Diagnosis not present

## 2023-04-22 DIAGNOSIS — R053 Chronic cough: Secondary | ICD-10-CM | POA: Diagnosis not present

## 2023-04-22 DIAGNOSIS — R9389 Abnormal findings on diagnostic imaging of other specified body structures: Secondary | ICD-10-CM | POA: Diagnosis not present

## 2023-04-22 DIAGNOSIS — E782 Mixed hyperlipidemia: Secondary | ICD-10-CM

## 2023-04-22 DIAGNOSIS — F331 Major depressive disorder, recurrent, moderate: Secondary | ICD-10-CM | POA: Diagnosis not present

## 2023-04-22 DIAGNOSIS — I10 Essential (primary) hypertension: Secondary | ICD-10-CM | POA: Diagnosis not present

## 2023-04-22 DIAGNOSIS — E1165 Type 2 diabetes mellitus with hyperglycemia: Secondary | ICD-10-CM

## 2023-04-22 NOTE — Progress Notes (Signed)
Subjective:  Patient ID: Mark Stewart, male    DOB: October 16, 1945  Age: 77 y.o. MRN: [id number]  Chief Complaint  Patient presents with   Medical Management of Chronic Issues    HPI   Pt presents for follow up of hypertension. Patient has had hypertension for more than 20 years.  He states that this is managed by cardiology.  He is on multiple medications including norvasc 10, coreg 25, clonidine patch, hctz 25mg , minoxidil 10, aldactone 50 and diovan 320 His cardiologist is Dr Royann Shivers and follows regularly  Pt with longstanding history of almost 30 years of diabetes.  He follows with Dr Allena Katz endocrinologist regularly.  His last hgb A1c was elevated - He is currently on Novolog through [facility] and Jardiance 25mg  He states his glucose readings have been good when he checks them but has had occasional low readings He is up to date on eye exam   Pt with GERD - stable on prilosec 40mg  qd  Pt with mild  depression and currently on wellbutrin daily - states med is working well for him  Pt with history of hematuria - he has seen Dr Saddie Benders and had full workup done recently including ct of abdomen and cystoscopy per patient - states all tests were negative and has been started on flomax qd  Pt with chronic cough - states he has had for several months - was seen last month and had xray which showed no pneumonia however it does show coarse interstitial markings in lung bases bilaterally - I recommend to get chest CT to further evaluate  Pt with obstructive sleep apnea and does use CPAP  Current Outpatient Medications on File Prior to Visit  Medication Sig Dispense Refill   amLODipine (NORVASC) 10 MG tablet Take 1 tablet (10 mg total) by mouth daily. 90 tablet 0   aspirin EC 81 MG tablet Take 81 mg by mouth daily.     atorvastatin (LIPITOR) 80 MG tablet Take 1 tablet (80 mg total) by mouth daily. 90 tablet 1   buPROPion (WELLBUTRIN XL) 150 MG 24 hr tablet Take 150 mg by mouth daily.      carvedilol (COREG) 25 MG tablet Take 1 tablet (25 mg total) by mouth 2 (two) times daily with a meal. 120 tablet 5   Cholecalciferol (VITAMIN D3 PO) Take by mouth daily.     cloNIDine (CATAPRES - DOSED IN MG/24 HR) 0.2 mg/24hr patch UNWRAP AND APPLY 1 PATCH TO SKIN WEEKLY 12 patch 1   empagliflozin (JARDIANCE) 25 MG TABS tablet Take 25 mg by mouth daily.      fluticasone (FLONASE) 50 MCG/ACT nasal spray PLACE 2 SPRAYS INTO BOTH NOSTRILS DAILY AS NEEDED FOR ALLERGIES OR RHINITIS. REPORTED ON 04/26/2016 48 mL 1   Insulin Disposable Pump (OMNIPOD DASH 5 PACK PODS) MISC      minoxidil (LONITEN) 10 MG tablet TAKE 1/2 TABLET BY MOUTH EVERY DAY 45 tablet 2   montelukast (SINGULAIR) 10 MG tablet TAKE 1 TABLET BY MOUTH EVERYDAY AT BEDTIME 90 tablet 1   mupirocin ointment (BACTROBAN) 2 % Apply 1 Application topically 2 (two) times daily. 22 g 0   NOVOLOG 100 UNIT/ML injection USE AS DIRECTED IN INSULIN PUMP. MDD: 100UNITS/DAY     omeprazole (PRILOSEC) 40 MG capsule TAKE 1 CAPSULE BY MOUTH EVERY DAY 90 capsule 2   spironolactone (ALDACTONE) 50 MG tablet TAKE 1 TABLET (50 MG TOTAL) BY MOUTH DAILY. SCHEDULE OFFICE VISIT FOR FUTURE REFILLS. 90 tablet 3  tamsulosin (FLOMAX) 0.4 MG CAPS capsule Take 0.4 mg by mouth daily after supper. Take 1 tablet 1/2 hour after supper     valsartan (DIOVAN) 320 MG tablet TAKE 1 TABLET BY MOUTH EVERY DAY 90 tablet 2   No current facility-administered medications on file prior to visit.   Past Medical History:  Diagnosis Date   Allergy    Cancer (HCC)    skin, hand   Cataract    history of surgery bilaterally   Hypercholesteremia    Hypertension    ICAO (internal carotid artery occlusion)    Spleen absent    patient states had spleen removed in high school   Stroke [facility]) 02/2016   Past Surgical History:  Procedure Laterality Date   EYE SURGERY     HERNIA REPAIR  02/28/2018   HERNIA REPAIR Bilateral 11/2019   PROSTATE SURGERY     SPLENECTOMY      Family History   Problem Relation Age of Onset   Breast cancer Mother    Diabetes Mellitus II Father    Breast cancer Sister    Sleep apnea Neg Hx    Social History   Socioeconomic History   Marital status: Married    Spouse name: graye   Number of children: Not on file   Years of education: Not on file   Highest education level: Not on file  Occupational History   Not on file  Tobacco Use   Smoking status: Former    Current packs/day: 1.00    Average packs/day: 1 pack/day for 20.0 years (20.0 ttl pk-yrs)    Types: Cigarettes   Smokeless tobacco: Never   Tobacco comments:    patient states last cigarette 35 years ago  Substance and Sexual Activity   Alcohol use: No   Drug use: No   Sexual activity: Not Currently  Other Topics Concern   Not on file  Social History Narrative   Lives with wife   Right Handed   Drinks 4-5 cups caffeine daily   Social Determinants of Health   Financial Resource Strain: Low Risk  (08/29/2020)   Overall Financial Resource Strain (CARDIA)    Difficulty of Paying Living Expenses: Not hard at all  Food Insecurity: No Food Insecurity (08/29/2020)   Hunger Vital Sign    Worried About Running Out of Food in the Last Year: Never true    Ran Out of Food in the Last Year: Never true  Transportation Needs: No Transportation Needs (08/29/2020)   PRAPARE - Administrator, Civil Service (Medical): No    Lack of Transportation (Non-Medical): No  Physical Activity: Inactive (03/28/2023)   Exercise Vital Sign    Days of Exercise per Week: 0 days    Minutes of Exercise per Session: 0 min  Stress: No Stress Concern Present (08/29/2020)   [employer] of Occupational Health - Occupational Stress Questionnaire    Feeling of Stress : Not at all  Social Connections: Moderately Integrated (08/29/2020)   Social Connection and Isolation Panel [NHANES]    Frequency of Communication with Friends and Family: Twice a week    Frequency of Social Gatherings with  Friends and Family: Twice a week    Attends Religious Services: More than 4 times per year    Active Member of [employer] or Organizations: No    Attends Banker Meetings: Never    Marital Status: Married  CONSTITUTIONAL: Negative for chills, fatigue, fever, unintentional weight gain and unintentional weight loss.  E/N/T: Negative for ear pain, nasal congestion and sore throat.  CARDIOVASCULAR: Negative for chest pain, dizziness, palpitations and pedal edema.  RESPIRATORY: still has cough - unchanged GASTROINTESTINAL: Negative for abdominal pain, acid reflux symptoms, constipation, diarrhea, nausea and vomiting.  MSK: Negative for arthralgias and myalgias.  INTEGUMENTARY: Negative for rash.  NEUROLOGICAL: Negative for dizziness and headaches.  PSYCHIATRIC: Negative for sleep disturbance and to question depression screen.  Negative for depression, negative for anhedonia.       Objective:  PHYSICAL EXAM:   VS: BP 120/66 (BP Location: Left Arm, Patient Position: Sitting, Cuff Size: Large)   Pulse 70   Temp (!) 97.5 F (36.4 C) (Temporal)   Ht 6\' 2"  (1.88 m)   Wt 219 lb 6.4 oz (99.5 kg)   SpO2 94%   BMI 28.17 kg/m   GEN: Well nourished, well developed, in no acute distress  Cardiac: RRR; no murmurs, rubs, or gallops,no edema  Respiratory:  normal respiratory rate and pattern with no distress - normal breath sounds with no rales, rhonchi, wheezes or rubs MS: no deformity or atrophy  Skin: warm and dry, no rash  Neuro:  Alert and Oriented x 3, - CN II-Xii grossly intact Psych: euthymic mood, appropriate affect and demeanor   Lab Results  Component Value Date   WBC 4.7 10/23/2022   HGB 15.1 10/23/2022   HCT 45.6 10/23/2022   PLT 300 10/23/2022   GLUCOSE 138 (H) 01/18/2023   CHOL 123 10/23/2022   TRIG 102 10/23/2022   HDL 31 (L) 10/23/2022   LDLCALC 73 10/23/2022   ALT 18 01/18/2023   AST 13 01/18/2023   NA 137 01/18/2023   K 4.6 01/18/2023   CL 102 01/18/2023    CREATININE 1.23 01/18/2023   BUN 19 01/18/2023   CO2 21 01/18/2023   HGBA1C 7.4 09/17/2022   HGBA1C 7.4 09/17/2022   MICROALBUR 150 03/21/2021      Assessment & Plan:   Problem List Items Addressed This Visit       Cardiovascular and Mediastinum   Essential hypertension   Relevant Orders   CBC with Differential/Platelet   Comprehensive metabolic panel   Lipid panel Continue current meds Follow up with cardiology as directed     Endocrine   Diabetes mellitus with hyperglycemia, with long-term current use of insulin (HCC) - Primary   Relevant Orders   Microalbumin / creatinine urine ratio   CBC with Differential/Platelet   Comprehensive metabolic panel   Lipid panel A1c Continue meds     Other   Hypercholesterolemia   Relevant Orders   CBC with Differential/Platelet   Comprehensive metabolic panel   Lipid panel Continue meds      Chronic cough CT chest with contrast      Mixed hyperlipidemia Continue meds Watch diet    Major depressive disorder, recurrent episode, moderate (HCC) Continue wellbutrin    .  No orders of the defined types were placed in this encounter.   Orders Placed This Encounter  Procedures   Microalbumin / creatinine urine ratio   CBC with Differential/Platelet   Comprehensive metabolic panel   Lipid panel   PSA           Follow-up: Return in about 4 months (around 08/23/2023) for chronic fasting follow-up - this week nurse visit labwork.  An After Visit Summary was printed and given to the patient.  Jettie Pagan [facility] [phone]

## 2023-04-25 ENCOUNTER — Other Ambulatory Visit: Payer: PPO

## 2023-04-25 DIAGNOSIS — E782 Mixed hyperlipidemia: Secondary | ICD-10-CM

## 2023-04-25 DIAGNOSIS — I10 Essential (primary) hypertension: Secondary | ICD-10-CM | POA: Diagnosis not present

## 2023-04-25 DIAGNOSIS — Z794 Long term (current) use of insulin: Secondary | ICD-10-CM | POA: Diagnosis not present

## 2023-04-25 DIAGNOSIS — E1165 Type 2 diabetes mellitus with hyperglycemia: Secondary | ICD-10-CM | POA: Diagnosis not present

## 2023-04-26 LAB — CBC WITH DIFFERENTIAL/PLATELET
Basophils Absolute: 0 10*3/uL (ref 0.0–0.2)
Basos: 1 %
EOS (ABSOLUTE): 0.5 10*3/uL — ABNORMAL HIGH (ref 0.0–0.4)
Eos: 7 %
Hematocrit: 46.4 % (ref 37.5–51.0)
Hemoglobin: 15.1 g/dL (ref 13.0–17.7)
Immature Grans (Abs): 0 10*3/uL (ref 0.0–0.1)
Immature Granulocytes: 0 %
Lymphocytes Absolute: 1.6 10*3/uL (ref 0.7–3.1)
Lymphs: 25 %
MCH: 30.7 pg (ref 26.6–33.0)
MCHC: 32.5 g/dL (ref 31.5–35.7)
MCV: 94 fL (ref 79–97)
Monocytes Absolute: 0.8 10*3/uL (ref 0.1–0.9)
Monocytes: 12 %
Neutrophils Absolute: 3.6 10*3/uL (ref 1.4–7.0)
Neutrophils: 55 %
Platelets: 295 10*3/uL (ref 150–450)
RBC: 4.92 x10E6/uL (ref 4.14–5.80)
RDW: 13.2 % (ref 11.6–15.4)
WBC: 6.5 10*3/uL (ref 3.4–10.8)

## 2023-04-26 LAB — COMPREHENSIVE METABOLIC PANEL
ALT: 17 IU/L (ref 0–44)
AST: 13 IU/L (ref 0–40)
Albumin: 3.9 g/dL (ref 3.8–4.8)
Alkaline Phosphatase: 94 IU/L (ref 44–121)
BUN/Creatinine Ratio: 16 (ref 10–24)
BUN: 23 mg/dL (ref 8–27)
Bilirubin Total: 0.5 mg/dL (ref 0.0–1.2)
CO2: 21 mmol/L (ref 20–29)
Calcium: 9.3 mg/dL (ref 8.6–10.2)
Chloride: 105 mmol/L (ref 96–106)
Creatinine, Ser: 1.4 mg/dL — ABNORMAL HIGH (ref 0.76–1.27)
Globulin, Total: 2.6 g/dL (ref 1.5–4.5)
Glucose: 154 mg/dL — ABNORMAL HIGH (ref 70–99)
Potassium: 4.6 mmol/L (ref 3.5–5.2)
Sodium: 141 mmol/L (ref 134–144)
Total Protein: 6.5 g/dL (ref 6.0–8.5)
eGFR: 52 mL/min/{1.73_m2} — ABNORMAL LOW (ref >59)

## 2023-04-26 LAB — LITHOLINK CKD PROGRAM

## 2023-04-26 LAB — TSH: TSH: 2.64 u[IU]/mL (ref 0.450–4.500)

## 2023-04-26 LAB — LIPID PANEL
Chol/HDL Ratio: 3.3 ratio (ref 0.0–5.0)
Cholesterol, Total: 126 mg/dL (ref 100–199)
HDL: 38 mg/dL — ABNORMAL LOW (ref >39)
LDL Chol Calc (NIH): 76 mg/dL (ref 0–99)
Triglycerides: 56 mg/dL (ref 0–149)
VLDL Cholesterol Cal: 12 mg/dL (ref 5–40)

## 2023-04-26 LAB — HEMOGLOBIN A1C
Est. average glucose Bld gHb Est-mCnc: 206 mg/dL
Hgb A1c MFr Bld: 8.8 % — ABNORMAL HIGH (ref 4.8–5.6)

## 2023-05-01 DIAGNOSIS — R9389 Abnormal findings on diagnostic imaging of other specified body structures: Secondary | ICD-10-CM | POA: Diagnosis not present

## 2023-05-01 DIAGNOSIS — E118 Type 2 diabetes mellitus with unspecified complications: Secondary | ICD-10-CM | POA: Diagnosis not present

## 2023-05-01 DIAGNOSIS — J984 Other disorders of lung: Secondary | ICD-10-CM | POA: Diagnosis not present

## 2023-05-01 DIAGNOSIS — I700 Atherosclerosis of aorta: Secondary | ICD-10-CM | POA: Diagnosis not present

## 2023-05-01 DIAGNOSIS — R053 Chronic cough: Secondary | ICD-10-CM | POA: Diagnosis not present

## 2023-05-01 DIAGNOSIS — R918 Other nonspecific abnormal finding of lung field: Secondary | ICD-10-CM | POA: Diagnosis not present

## 2023-05-08 ENCOUNTER — Ambulatory Visit: Payer: PPO | Admitting: Podiatry

## 2023-05-08 DIAGNOSIS — B351 Tinea unguium: Secondary | ICD-10-CM | POA: Diagnosis not present

## 2023-05-08 DIAGNOSIS — M79674 Pain in right toe(s): Secondary | ICD-10-CM | POA: Diagnosis not present

## 2023-05-08 DIAGNOSIS — M79675 Pain in left toe(s): Secondary | ICD-10-CM | POA: Diagnosis not present

## 2023-05-08 NOTE — Progress Notes (Signed)
       Subjective:  Patient ID: Mark Stewart, male    DOB: [birth year],  MRN: [id number]   Mark Stewart presents to clinic today for:  Chief Complaint  Patient presents with   Nail Problem    Diabetic Foot Care-nail trim   . Patient notes nails are thick, discolored, elongated and painful in shoegear when trying to ambulate.    PCP is Marianne Sofia, PA-C.  Allergies  Allergen Reactions   Bactrim [Sulfamethoxazole-Trimethoprim] Hives   Benazepril Cough   Metformin And Related Rash   Penicillins Rash    Has patient had a PCN reaction causing immediate rash, facial/tongue/throat swelling, SOB or lightheadedness with hypotension: yes Has patient had a PCN reaction causing severe rash involving mucus membranes or skin necrosis: no Has patient had a PCN reaction that required hospitalization: no Has patient had a PCN reaction occurring within the last 10 years: no If all of the above answers are "NO", then may proceed with Cephalosporin use.     Review of Systems: Negative except as noted in the HPI.  Objective:  There were no vitals filed for this visit.  Mark Stewart is a pleasant 77 y.o. male in NAD. AAO x 3.  Vascular Examination: Capillary refill time is 3-5 seconds to toes bilateral. Palpable pedal pulses b/l LE. Digital hair present b/l. No pedal edema b/l. Skin temperature gradient WNL b/l. No varicosities b/l. No cyanosis or clubbing noted b/l.   Dermatological Examination: Pedal skin with normal turgor, texture and tone b/l. No open wounds. No interdigital macerations b/l. Toenails x10 are 3mm thick, discolored, dystrophic with subungual debris. There is pain with compression of the nail plates.  They are elongated x10  Assessment/Plan: 1. Pain due to onychomycosis of toenails of both feet    The mycotic toenails were sharply debrided x10 with sterile nail nippers and a power debriding burr to decrease bulk/thickness and length.    Return in about 3 months  (around 08/08/2023) for [facility].   Clerance Lav, DPM, FACFAS [facility]     [address] [place], [place] [address]                Office [phone]  Fax [phone]

## 2023-05-09 ENCOUNTER — Encounter: Payer: Self-pay | Admitting: Physician Assistant

## 2023-05-09 ENCOUNTER — Telehealth: Payer: Self-pay

## 2023-05-09 NOTE — Telephone Encounter (Signed)
Pt left a VM on the sleep lab phone stating he "turned in his cpap machine" - did not leave anymore details or information and did not state if he needed a call back. Pt sees Dr. Frances Furbish and Ihor Austin - forwarding to both pods so the correct person is aware

## 2023-05-09 NOTE — Telephone Encounter (Signed)
Call to patient to inquire on him turning in his CPAP machine. No answer, left message on voicemail to call office back.

## 2023-05-13 ENCOUNTER — Telehealth: Payer: Self-pay | Admitting: Cardiovascular Disease

## 2023-05-13 NOTE — Telephone Encounter (Signed)
Patient verbalized understanding of his results, but wants Dr C to review and give him his response

## 2023-05-13 NOTE — Telephone Encounter (Signed)
Contacted pt x2, no answer

## 2023-05-13 NOTE — Telephone Encounter (Signed)
Patient is asking that the Dr take a look at his cT results. And give him a call about it. Please advise

## 2023-05-15 ENCOUNTER — Telehealth: Payer: Self-pay

## 2023-05-15 NOTE — Telephone Encounter (Signed)
Patient called and left message about questions about his CT.   Called patient back and left a message for patient to call office back.

## 2023-05-15 NOTE — Telephone Encounter (Signed)
Patient called back and wanted to know if he should see is heart doctor before his appointment due to his CT showing Aortic atherosclerosis and Moderate coronary artery calcifications.  Made patient aware per provider he is currently being treated with  statin,  and aspirin, and follow up with cardiology as directed.

## 2023-06-05 DIAGNOSIS — E118 Type 2 diabetes mellitus with unspecified complications: Secondary | ICD-10-CM | POA: Diagnosis not present

## 2023-06-05 DIAGNOSIS — E113393 Type 2 diabetes mellitus with moderate nonproliferative diabetic retinopathy without macular edema, bilateral: Secondary | ICD-10-CM | POA: Diagnosis not present

## 2023-06-05 LAB — HM DIABETES EYE EXAM

## 2023-06-11 ENCOUNTER — Other Ambulatory Visit: Payer: Self-pay

## 2023-06-11 DIAGNOSIS — J4489 Other specified chronic obstructive pulmonary disease: Secondary | ICD-10-CM

## 2023-06-11 MED ORDER — VALSARTAN 320 MG PO TABS: 320.0000 mg | ORAL_TABLET | Freq: Every day | ORAL | 2 refills | Status: DC

## 2023-06-13 ENCOUNTER — Encounter: Payer: Self-pay | Admitting: Physician Assistant

## 2023-06-13 ENCOUNTER — Ambulatory Visit (INDEPENDENT_AMBULATORY_CARE_PROVIDER_SITE_OTHER): Payer: PPO | Admitting: Physician Assistant

## 2023-06-13 VITALS — BP 140/70 | HR 79 | Temp 97.4°F | Resp 14 | Ht 74.0 in | Wt 219.0 lb

## 2023-06-13 DIAGNOSIS — I700 Atherosclerosis of aorta: Secondary | ICD-10-CM | POA: Diagnosis not present

## 2023-06-13 DIAGNOSIS — R053 Chronic cough: Secondary | ICD-10-CM

## 2023-06-13 NOTE — Assessment & Plan Note (Signed)
Referral sent to Pulmonology Will continue trying the Robitussin at night to help with sleep

## 2023-06-13 NOTE — Progress Notes (Signed)
Acute Office Visit  Subjective:    Patient ID: Mark Stewart, male    DOB: [birth year], 77 y.o.   MRN: [id number]  Chief Complaint  Patient presents with   Cough    HPI: Patient is in today for dry cough since one year ago. He trying Robitussin safe and it did help last night. He denies SOB, chest pain, heartburn. CT chest on 05/01/23 showed CT shows bibasilar scarring Aortic atherosclerosis noted Moderate coronary artery calcifications noted - recommend to stay on statin, stay on aspirin, and follow up with cardiology as directed  Previous smoker: Has quit for over 40 years.  Patient admits tthe Robitussin helped decrease the frequency, but still would have coughing episodes they would just not be as deep and long of coughing spells. States they happen any time day or night. Denies it being productive.   Past Medical History:  Diagnosis Date   Allergy    Cancer (HCC)    skin, hand   Cataract    history of surgery bilaterally   Hypercholesteremia    Hypertension    ICAO (internal carotid artery occlusion)    Spleen absent    patient states had spleen removed in high school   Stroke [facility]) 02/2016    Past Surgical History:  Procedure Laterality Date   EYE SURGERY     HERNIA REPAIR  02/28/2018   HERNIA REPAIR Bilateral 11/2019   PROSTATE SURGERY     SPLENECTOMY      Family History  Problem Relation Age of Onset   Breast cancer Mother    Diabetes Mellitus II Father    Breast cancer Sister    Sleep apnea Neg Hx     Social History   Socioeconomic History   Marital status: Married    Spouse name: graye   Number of children: Not on file   Years of education: Not on file   Highest education level: Not on file  Occupational History   Not on file  Tobacco Use   Smoking status: Former    Current packs/day: 1.00    Average packs/day: 1 pack/day for 20.0 years (20.0 ttl pk-yrs)    Types: Cigarettes   Smokeless tobacco: Never   Tobacco comments:    patient  states last cigarette 35 years ago  Substance and Sexual Activity   Alcohol use: No   Drug use: No   Sexual activity: Not Currently  Other Topics Concern   Not on file  Social History Narrative   Lives with wife   Right Handed   Drinks 4-5 cups caffeine daily   Social Determinants of Health   Financial Resource Strain: Low Risk  (08/29/2020)   Overall Financial Resource Strain (CARDIA)    Difficulty of Paying Living Expenses: Not hard at all  Food Insecurity: No Food Insecurity (08/29/2020)   Hunger Vital Sign    Worried About Running Out of Food in the Last Year: Never true    Ran Out of Food in the Last Year: Never true  Transportation Needs: No Transportation Needs (08/29/2020)   PRAPARE - Administrator, Civil Service (Medical): No    Lack of Transportation (Non-Medical): No  Physical Activity: Inactive (03/28/2023)   Exercise Vital Sign    Days of Exercise per Week: 0 days    Minutes of Exercise per Session: 0 min  Stress: No Stress Concern Present (08/29/2020)   [employer] of Occupational Health - Occupational Stress Questionnaire    Feeling of  Stress : Not at all  Social Connections: Moderately Integrated (08/29/2020)   Social Connection and Isolation Panel [NHANES]    Frequency of Communication with Friends and Family: Twice a week    Frequency of Social Gatherings with Friends and Family: Twice a week    Attends Religious Services: More than 4 times per year    Active Member of [employer] or Organizations: No    Attends Banker Meetings: Never    Marital Status: Married  Catering manager Violence: Not At Risk (08/29/2020)   Humiliation, Afraid, Rape, and Kick questionnaire    Fear of Current or Ex-Partner: No    Emotionally Abused: No    Physically Abused: No    Sexually Abused: No    Outpatient Medications Prior to Visit  Medication Sig Dispense Refill   amLODipine (NORVASC) 10 MG tablet Take 1 tablet (10 mg total) by mouth daily.  90 tablet 0   aspirin EC 81 MG tablet Take 81 mg by mouth daily.     atorvastatin (LIPITOR) 80 MG tablet Take 1 tablet (80 mg total) by mouth daily. 90 tablet 1   B-D UF III MINI PEN NEEDLES 31G X 5 MM MISC      buPROPion (WELLBUTRIN XL) 150 MG 24 hr tablet Take 150 mg by mouth daily.     carvedilol (COREG) 25 MG tablet Take 1 tablet (25 mg total) by mouth 2 (two) times daily with a meal. 120 tablet 5   Cholecalciferol (VITAMIN D3 PO) Take by mouth daily.     cloNIDine (CATAPRES - DOSED IN MG/24 HR) 0.2 mg/24hr patch UNWRAP AND APPLY 1 PATCH TO SKIN WEEKLY 12 patch 1   empagliflozin (JARDIANCE) 25 MG TABS tablet Take 25 mg by mouth daily.      finasteride (PROSCAR) 5 MG tablet      fluticasone (FLONASE) 50 MCG/ACT nasal spray PLACE 2 SPRAYS INTO BOTH NOSTRILS DAILY AS NEEDED FOR ALLERGIES OR RHINITIS. REPORTED ON 04/26/2016 48 mL 1   Insulin Disposable Pump (OMNIPOD DASH 5 PACK PODS) MISC      minoxidil (LONITEN) 10 MG tablet TAKE 1/2 TABLET BY MOUTH EVERY DAY 45 tablet 2   montelukast (SINGULAIR) 10 MG tablet TAKE 1 TABLET BY MOUTH EVERYDAY AT BEDTIME 90 tablet 1   mupirocin ointment (BACTROBAN) 2 % Apply 1 Application topically 2 (two) times daily. 22 g 0   NOVOLOG 100 UNIT/ML injection USE AS DIRECTED IN INSULIN PUMP. MDD: 100UNITS/DAY     omeprazole (PRILOSEC) 40 MG capsule TAKE 1 CAPSULE BY MOUTH EVERY DAY 90 capsule 2   spironolactone (ALDACTONE) 50 MG tablet TAKE 1 TABLET (50 MG TOTAL) BY MOUTH DAILY. SCHEDULE OFFICE VISIT FOR FUTURE REFILLS. 90 tablet 3   tamsulosin (FLOMAX) 0.4 MG CAPS capsule Take 0.4 mg by mouth daily after supper. Take 1 tablet 1/2 hour after supper     valsartan (DIOVAN) 320 MG tablet Take 1 tablet (320 mg total) by mouth daily. 90 tablet 2   No facility-administered medications prior to visit.    Allergies  Allergen Reactions   Bactrim [Sulfamethoxazole-Trimethoprim] Hives   Benazepril Cough   Metformin And Related Rash   Penicillins Rash    Has patient had  a PCN reaction causing immediate rash, facial/tongue/throat swelling, SOB or lightheadedness with hypotension: yes Has patient had a PCN reaction causing severe rash involving mucus membranes or skin necrosis: no Has patient had a PCN reaction that required hospitalization: no Has patient had a PCN reaction occurring within the  last 10 years: no If all of the above answers are "NO", then may proceed with Cephalosporin use.     Review of Systems  Constitutional:  Negative for chills, fatigue and fever.  HENT:  Negative for congestion, ear pain, sinus pain and sore throat.   Respiratory:  Positive for cough. Negative for shortness of breath and wheezing.   Cardiovascular:  Negative for chest pain.  Musculoskeletal:  Negative for myalgias.  Neurological:  Negative for headaches.       Objective:        06/13/2023   10:30 AM 06/13/2023   10:04 AM 04/22/2023   11:15 AM  Vitals with BMI  Height  6\' 2"  6\' 2"   Weight  219 lbs 219 lbs 6 oz  BMI  28.11 28.16  Systolic 140 144 409  Diastolic 70 70 66  Pulse  79 70    No data found.   Physical Exam Vitals reviewed.  Constitutional:      Appearance: Normal appearance.  Cardiovascular:     Rate and Rhythm: Normal rate and regular rhythm.     Heart sounds: Normal heart sounds.  Pulmonary:     Effort: Pulmonary effort is normal.     Breath sounds: Normal breath sounds. No stridor. No wheezing, rhonchi or rales.  Abdominal:     General: Bowel sounds are normal.     Palpations: Abdomen is soft.     Tenderness: There is no abdominal tenderness.  Neurological:     Mental Status: He is alert and oriented to person, place, and time.  Psychiatric:        Mood and Affect: Mood normal.        Behavior: Behavior normal.   Total time spent on today's visit was greater than 20 minutes, including both face-to-face time and nonface-to-face time personally spent on review of chart (labs and imaging), discussing labs and goals, discussing further  work-up, treatment options, referrals to specialist if needed, reviewing outside records of pertinent, answering patient's questions, and coordinating care.   Health Maintenance Due  Topic Date Due   DTaP/Tdap/Td (1 - Tdap) Never done   INFLUENZA VACCINE  05/09/2023   COVID-19 Vaccine (5 - 2023-24 season) 06/09/2023   Medicare Annual Wellness (AWV)  06/27/2023    There are no preventive care reminders to display for this patient.   Lab Results  Component Value Date   TSH 2.640 04/25/2023   Lab Results  Component Value Date   WBC 6.5 04/25/2023   HGB 15.1 04/25/2023   HCT 46.4 04/25/2023   MCV 94 04/25/2023   PLT 295 04/25/2023   Lab Results  Component Value Date   NA 141 04/25/2023   K 4.6 04/25/2023   CO2 21 04/25/2023   GLUCOSE 154 (H) 04/25/2023   BUN 23 04/25/2023   CREATININE 1.40 (H) 04/25/2023   BILITOT 0.5 04/25/2023   ALKPHOS 94 04/25/2023   AST 13 04/25/2023   ALT 17 04/25/2023   PROT 6.5 04/25/2023   ALBUMIN 3.9 04/25/2023   CALCIUM 9.3 04/25/2023   ANIONGAP 8 06/16/2020   EGFR 52 (L) 04/25/2023   Lab Results  Component Value Date   CHOL 126 04/25/2023   Lab Results  Component Value Date   HDL 38 (L) 04/25/2023   Lab Results  Component Value Date   LDLCALC 76 04/25/2023   Lab Results  Component Value Date   TRIG 56 04/25/2023   Lab Results  Component Value Date   CHOLHDL 3.3 04/25/2023  Lab Results  Component Value Date   HGBA1C 8.8 (H) 04/25/2023       Assessment & Plan:  Chronic cough Assessment & Plan: Referral sent to Pulmonology Will continue trying the Robitussin at night to help with sleep  Orders: -     Ambulatory referral to Pulmonology     No orders of the defined types were placed in this encounter.   Orders Placed This Encounter  Procedures   Ambulatory referral to Pulmonology     Follow-up: No follow-ups on file.  An After Visit Summary was printed and given to the patient.  Langley Gauss, [facility] [phone]

## 2023-06-18 ENCOUNTER — Ambulatory Visit (INDEPENDENT_AMBULATORY_CARE_PROVIDER_SITE_OTHER): Payer: PPO

## 2023-06-18 VITALS — Ht 72.0 in | Wt 215.0 lb

## 2023-06-18 DIAGNOSIS — Z0000 Encounter for general adult medical examination without abnormal findings: Secondary | ICD-10-CM

## 2023-06-18 NOTE — Patient Instructions (Addendum)
Mark Stewart , Thank you for taking time to come for your Medicare Wellness Visit. I appreciate your ongoing commitment to your health goals. Please review the following plan we discussed and let me know if I can assist you in the future.   Referrals/Orders/Follow-Ups/Clinician Recommendations:   This is a list of the screening recommended for you and due dates:  Health Maintenance  Topic Date Due   DTaP/Tdap/Td vaccine (1 - Tdap) Never done   Flu Shot  05/09/2023   COVID-19 Vaccine (5 - 2023-24 season) 06/09/2023   Colon Cancer Screening  10/17/2023*   Complete foot exam   06/29/2023   Yearly kidney health urinalysis for diabetes  10/18/2023   Hemoglobin A1C  10/26/2023   Yearly kidney function blood test for diabetes  04/24/2024   Eye exam for diabetics  06/04/2024   Medicare Annual Wellness Visit  06/17/2024   Pneumonia Vaccine  Completed   HPV Vaccine  Aged Out   Hepatitis C Screening  Discontinued   Zoster (Shingles) Vaccine  Discontinued  *Topic was postponed. The date shown is not the original due date.    Advanced directives: (Copy Requested) Please bring a copy of your health care power of attorney and living will to the office to be added to your chart at your convenience.  Next Medicare Annual Wellness Visit scheduled for next year: Yes

## 2023-06-18 NOTE — Progress Notes (Signed)
Subjective:   Mark Stewart is a 77 y.o. male who presents for Medicare Annual/Subsequent preventive examination.  Visit Complete: Virtual  I connected with  Mark Stewart on 06/18/23 by a audio enabled telemedicine application and verified that I am speaking with the correct person using two identifiers.  Patient Location: Home  Provider Location: Home Office  I discussed the limitations of evaluation and management by telemedicine. The patient expressed understanding and agreed to proceed.     Review of Systems    Vital Signs: Unable to obtain new vitals due to this being a telehealth visit.  Cardiac Risk Factors include: advanced age (>48men, >37 women);diabetes mellitus;male gender;hypertension     Objective:    Today's Vitals   06/18/23 1012 06/18/23 1013  Weight: 215 lb (97.5 kg)   Height: 6' (1.829 m)   PainSc:  0-No pain   Body mass index is 29.16 kg/m.     06/18/2023   10:21 AM 08/29/2020   11:15 AM 06/16/2020    9:47 PM 05/02/2016   10:55 AM 04/30/2016    1:48 PM 04/12/2016    4:40 PM 04/12/2016    4:12 PM  Advanced Directives  Does Patient Have a Medical Advance Directive? Yes Yes No No   No  Type of Estate agent of [place];Living will Healthcare Power of [place];Living will       Copy of Healthcare Power of Attorney in Chart? No - copy requested No - copy requested       Would patient like information on creating a medical advance directive?   No - Patient declined Yes - Transport planner given Yes - Transport planner given Yes - Transport planner given     Current Medications (verified) Outpatient Encounter Medications as of 06/18/2023  Medication Sig   amLODipine (NORVASC) 10 MG tablet Take 1 tablet (10 mg total) by mouth daily.   aspirin EC 81 MG tablet Take 81 mg by mouth daily.   atorvastatin (LIPITOR) 80 MG tablet Take 1 tablet (80 mg total) by mouth daily.   B-D UF III MINI PEN NEEDLES 31G X 5 MM MISC     buPROPion (WELLBUTRIN XL) 150 MG 24 hr tablet Take 150 mg by mouth daily.   carvedilol (COREG) 25 MG tablet Take 1 tablet (25 mg total) by mouth 2 (two) times daily with a meal.   Cholecalciferol (VITAMIN D3 PO) Take by mouth daily.   cloNIDine (CATAPRES - DOSED IN MG/24 HR) 0.2 mg/24hr patch UNWRAP AND APPLY 1 PATCH TO SKIN WEEKLY   empagliflozin (JARDIANCE) 25 MG TABS tablet Take 25 mg by mouth daily.    finasteride (PROSCAR) 5 MG tablet    fluticasone (FLONASE) 50 MCG/ACT nasal spray PLACE 2 SPRAYS INTO BOTH NOSTRILS DAILY AS NEEDED FOR ALLERGIES OR RHINITIS. REPORTED ON 04/26/2016   Insulin Disposable Pump (OMNIPOD DASH 5 PACK PODS) MISC    minoxidil (LONITEN) 10 MG tablet TAKE 1/2 TABLET BY MOUTH EVERY DAY   montelukast (SINGULAIR) 10 MG tablet TAKE 1 TABLET BY MOUTH EVERYDAY AT BEDTIME   mupirocin ointment (BACTROBAN) 2 % Apply 1 Application topically 2 (two) times daily.   NOVOLOG 100 UNIT/ML injection USE AS DIRECTED IN INSULIN PUMP. MDD: 100UNITS/DAY   omeprazole (PRILOSEC) 40 MG capsule TAKE 1 CAPSULE BY MOUTH EVERY DAY   spironolactone (ALDACTONE) 50 MG tablet TAKE 1 TABLET (50 MG TOTAL) BY MOUTH DAILY. SCHEDULE OFFICE VISIT FOR FUTURE REFILLS.   tamsulosin (FLOMAX) 0.4 MG CAPS capsule Take 0.4  mg by mouth daily after supper. Take 1 tablet 1/2 hour after supper   valsartan (DIOVAN) 320 MG tablet Take 1 tablet (320 mg total) by mouth daily.   No facility-administered encounter medications on file as of 06/18/2023.    Allergies (verified) Bactrim [sulfamethoxazole-trimethoprim], Benazepril, Metformin and related, and Penicillins   History: Past Medical History:  Diagnosis Date   Allergy    Cancer (HCC)    skin, hand   Cataract    history of surgery bilaterally   Hypercholesteremia    Hypertension    ICAO (internal carotid artery occlusion)    Spleen absent    patient states had spleen removed in high school   Stroke [facility]) 02/2016   Past Surgical History:  Procedure  Laterality Date   EYE SURGERY     HERNIA REPAIR  02/28/2018   HERNIA REPAIR Bilateral 11/2019   PROSTATE SURGERY     SPLENECTOMY     Family History  Problem Relation Age of Onset   Breast cancer Mother    Diabetes Mellitus II Father    Breast cancer Sister    Sleep apnea Neg Hx    Social History   Socioeconomic History   Marital status: Married    Spouse name: graye   Number of children: Not on file   Years of education: Not on file   Highest education level: Not on file  Occupational History   Not on file  Tobacco Use   Smoking status: Former    Current packs/day: 1.00    Average packs/day: 1 pack/day for 20.0 years (20.0 ttl pk-yrs)    Types: Cigarettes   Smokeless tobacco: Never   Tobacco comments:    patient states last cigarette 35 years ago  Substance and Sexual Activity   Alcohol use: No   Drug use: No   Sexual activity: Not Currently  Other Topics Concern   Not on file  Social History Narrative   Lives with wife   Right Handed   Drinks 4-5 cups caffeine daily   Social Determinants of Health   Financial Resource Strain: Low Risk  (06/18/2023)   Overall Financial Resource Strain (CARDIA)    Difficulty of Paying Living Expenses: Not hard at all  Food Insecurity: No Food Insecurity (06/18/2023)   Hunger Vital Sign    Worried About Running Out of Food in the Last Year: Never true    Ran Out of Food in the Last Year: Never true  Transportation Needs: No Transportation Needs (06/18/2023)   PRAPARE - Administrator, Civil Service (Medical): No    Lack of Transportation (Non-Medical): No  Physical Activity: Inactive (06/18/2023)   Exercise Vital Sign    Days of Exercise per Week: 0 days    Minutes of Exercise per Session: 0 min  Stress: No Stress Concern Present (06/18/2023)   [employer] of Occupational Health - Occupational Stress Questionnaire    Feeling of Stress : Not at all  Social Connections: Socially Integrated (06/18/2023)   Social  Connection and Isolation Panel [NHANES]    Frequency of Communication with Friends and Family: More than three times a week    Frequency of Social Gatherings with Friends and Family: More than three times a week    Attends Religious Services: More than 4 times per year    Active Member of [employer] or Organizations: Yes    Attends Engineer, structural: More than 4 times per year    Marital Status: Married  Tobacco Counseling Counseling given: Not Answered Tobacco comments: patient states last cigarette 35 years ago   Clinical Intake:  Pre-visit preparation completed: Yes  Pain : No/denies pain Pain Score: 0-No pain     BMI - recorded: 29.16 Nutritional Status: BMI 25 -29 Overweight Nutritional Risks: None Diabetes: Yes CBG done?: No Did pt. bring in CBG monitor from home?: No  How often do you need to have someone help you when you read instructions, pamphlets, or other written materials from your doctor or pharmacy?: 1 - Never  Interpreter Needed?: No  Information entered by :: Theresa Mulligan LPN   Activities of Daily Living    06/18/2023   10:18 AM 07/02/2022    4:11 PM  In your present state of health, do you have any difficulty performing the following activities:  Hearing? 1 0  Comment Wears hearing aids   Vision? 0 0  Difficulty concentrating or making decisions? 0 0  Walking or climbing stairs? 0 0  Dressing or bathing? 0 0  Doing errands, shopping? 0 0  Preparing Food and eating ? N N  Using the Toilet? N N  In the past six months, have you accidently leaked urine? Y N  Comment Wears pads. Followed by Urologist   Do you have problems with loss of bowel control? N N  Managing your Medications? N N  Managing your Finances? N N  Housekeeping or managing your Housekeeping? N N    Patient Care Team: Marianne Sofia, Cordelia Poche as PCP - General (Physician Assistant) Thurmon Fair, MD as PCP - Cardiology (Cardiology)  Indicate any recent Medical Services  you may have received from other than [facility] providers in the past year (date may be approximate).     Assessment:   This is a routine wellness examination for [place].  Hearing/Vision screen Hearing Screening - Comments:: Wears hearing aids Vision Screening - Comments:: Wears rx glasses - up to date with routine eye exams with  [facility]   Goals Addressed               This Visit's Progress     Increase physical activity (pt-stated)         Depression Screen    06/18/2023   10:17 AM 04/22/2023   11:19 AM 03/28/2023    3:00 PM 10/16/2022   11:18 AM 07/02/2022    4:10 PM 03/21/2021   10:17 AM 08/29/2020   11:16 AM  PHQ 2/9 Scores  PHQ - 2 Score 0 0 0 0 0 2 1  PHQ- 9 Score 0 0 1  1 3      Fall Risk    06/18/2023   10:19 AM 04/22/2023   11:19 AM 03/28/2023    3:00 PM 10/16/2022   11:18 AM 07/02/2022    4:10 PM  Fall Risk   Falls in the past year? 0 0 0 1 0  Number falls in past yr: 0 0 0 0 0  Injury with Fall? 0 0 0 0 0  Risk for fall due to : No Fall Risks No Fall Risks No Fall Risks Impaired balance/gait No Fall Risks  Follow up Falls prevention discussed Falls evaluation completed Falls evaluation completed Falls evaluation completed Falls evaluation completed;Falls prevention discussed    MEDICARE RISK AT HOME: Medicare Risk at Home Any stairs in or around the home?: No If so, are there any without handrails?: No Home free of loose throw rugs in walkways, pet beds, electrical cords, etc?: Yes Adequate lighting  in your home to reduce risk of falls?: Yes Life alert?: No Use of a cane, walker or w/c?: No Grab bars in the bathroom?: Yes Shower chair or bench in shower?: Yes Elevated toilet seat or a handicapped toilet?: No  TIMED UP AND GO:  Was the test performed?  No    Cognitive Function:    10/25/2017    8:46 AM 04/26/2016    1:02 PM  MMSE - Mini Mental State Exam  Orientation to time 5 5  Orientation to Place 5 5  Registration 3 3  Attention/  Calculation 4 5  Recall 2 3  Language- name 2 objects 2 2  Language- repeat 0 1  Language- follow 3 step command 3 3  Language- read & follow direction 1 1  Write a sentence 1 1  Copy design 1 1  Total score 27 30        06/18/2023   10:21 AM 07/02/2022    4:12 PM 08/29/2020   11:19 AM  6CIT Screen  What Year? 0 points 0 points 0 points  What month? 0 points 0 points 0 points  What time? 0 points 0 points 0 points  Count back from 20 0 points 0 points 0 points  Months in reverse 0 points 0 points 0 points  Repeat phrase 0 points 0 points 0 points  Total Score 0 points 0 points 0 points    Immunizations Immunization History  Administered Date(s) Administered   COVID-19, mRNA, vaccine(Comirnaty)12 years and older 08/20/2022   Fluad Quad(high Dose 65+) 06/24/2020, 07/24/2021, 08/20/2022   Influenza, High Dose Seasonal PF 07/05/2016, 12/18/2017, 08/20/2018   Influenza-Unspecified 07/02/2019   Moderna Sars-Covid-2 Vaccination 11/20/2019, 12/16/2019, 09/23/2020   Pneumococcal Conjugate-13 08/01/2015   Pneumococcal Polysaccharide-23 10/08/1997, 09/16/2020    TDAP status: Due, Education has been provided regarding the importance of this vaccine. Advised may receive this vaccine at local pharmacy or Health Dept. Aware to provide a copy of the vaccination record if obtained from local pharmacy or Health Dept. Verbalized acceptance and understanding.  Flu Vaccine status: Due, Education has been provided regarding the importance of this vaccine. Advised may receive this vaccine at local pharmacy or Health Dept. Aware to provide a copy of the vaccination record if obtained from local pharmacy or Health Dept. Verbalized acceptance and understanding.  Pneumococcal vaccine status: Completed during today's visit.  Covid-19 vaccine status: Declined, Education has been provided regarding the importance of this vaccine but patient still declined. Advised may receive this vaccine at local  pharmacy or Health Dept.or vaccine clinic. Aware to provide a copy of the vaccination record if obtained from local pharmacy or Health Dept. Verbalized acceptance and understanding.    Screening Tests Health Maintenance  Topic Date Due   DTaP/Tdap/Td (1 - Tdap) Never done   INFLUENZA VACCINE  05/09/2023   COVID-19 Vaccine (5 - 2023-24 season) 06/09/2023   Colonoscopy  10/17/2023 (Originally 08/22/2022)   FOOT EXAM  06/29/2023   Diabetic kidney evaluation - Urine ACR  10/18/2023   HEMOGLOBIN A1C  10/26/2023   Diabetic kidney evaluation - eGFR measurement  04/24/2024   OPHTHALMOLOGY EXAM  06/04/2024   Medicare Annual Wellness (AWV)  06/17/2024   Pneumonia Vaccine 22+ Years old  Completed   HPV VACCINES  Aged Out   Hepatitis C Screening  Discontinued   Zoster Vaccines- Shingrix  Discontinued    Health Maintenance  Health Maintenance Due  Topic Date Due   DTaP/Tdap/Td (1 - Tdap) Never done  INFLUENZA VACCINE  05/09/2023   COVID-19 Vaccine (5 - 2023-24 season) 06/09/2023    Colorectal cancer screening: Referral to GI placed Deferred. Pt aware the office will call re: appt.  Lung Cancer Screening: (Low Dose CT Chest recommended if Age 63-80 years, 20 pack-year currently smoking OR have quit w/in 15years.) does not qualify.     Additional Screening:    Vision Screening: Recommended annual ophthalmology exams for early detection of glaucoma and other disorders of the eye. Is the patient up to date with their annual eye exam?  Yes  Who is the provider or what is the name of the office in which the patient attends annual eye exams? [facility] If pt is not established with a provider, would they like to be referred to a provider to establish care? No .   Dental Screening: Recommended annual dental exams for proper oral hygiene  Diabetic Foot Exam: Diabetic Foot Exam: Completed 06/28/22  Community Resource Referral / Chronic Care Management:  CRR required this visit?   No   CCM required this visit?  No     Plan:     I have personally reviewed and noted the following in the patient's chart:   Medical and social history Use of alcohol, tobacco or illicit drugs  Current medications and supplements including opioid prescriptions. Patient is not currently taking opioid prescriptions. Functional ability and status Nutritional status Physical activity Advanced directives List of other physicians Hospitalizations, surgeries, and ER visits in previous 12 months Vitals Screenings to include cognitive, depression, and falls Referrals and appointments  In addition, I have reviewed and discussed with patient certain preventive protocols, quality metrics, and best practice recommendations. A written personalized care plan for preventive services as well as general preventive health recommendations were provided to patient.     Tillie Rung, LPN   [id number]/[id number]   After Visit Summary: (MyChart) Due to this being a telephonic visit, the after visit summary with patients personalized plan was offered to patient via MyChart   Nurse Notes: None

## 2023-06-22 ENCOUNTER — Other Ambulatory Visit: Payer: Self-pay | Admitting: Physician Assistant

## 2023-06-22 DIAGNOSIS — J4489 Other specified chronic obstructive pulmonary disease: Secondary | ICD-10-CM

## 2023-06-26 DIAGNOSIS — Z794 Long term (current) use of insulin: Secondary | ICD-10-CM | POA: Diagnosis not present

## 2023-06-26 DIAGNOSIS — Z9641 Presence of insulin pump (external) (internal): Secondary | ICD-10-CM | POA: Diagnosis not present

## 2023-06-26 DIAGNOSIS — E1165 Type 2 diabetes mellitus with hyperglycemia: Secondary | ICD-10-CM | POA: Diagnosis not present

## 2023-06-26 DIAGNOSIS — E1142 Type 2 diabetes mellitus with diabetic polyneuropathy: Secondary | ICD-10-CM | POA: Diagnosis not present

## 2023-06-26 DIAGNOSIS — Z7984 Long term (current) use of oral hypoglycemic drugs: Secondary | ICD-10-CM | POA: Diagnosis not present

## 2023-06-26 DIAGNOSIS — Z978 Presence of other specified devices: Secondary | ICD-10-CM | POA: Diagnosis not present

## 2023-07-01 ENCOUNTER — Other Ambulatory Visit: Payer: Self-pay

## 2023-07-01 MED ORDER — ATORVASTATIN CALCIUM 80 MG PO TABS: 80.0000 mg | ORAL_TABLET | Freq: Every day | ORAL | 1 refills | Status: DC

## 2023-07-05 DIAGNOSIS — E118 Type 2 diabetes mellitus with unspecified complications: Secondary | ICD-10-CM | POA: Diagnosis not present

## 2023-07-06 NOTE — Progress Notes (Signed)
Seen by casting department

## 2023-08-07 ENCOUNTER — Encounter: Payer: Self-pay | Admitting: Podiatry

## 2023-08-07 ENCOUNTER — Ambulatory Visit (INDEPENDENT_AMBULATORY_CARE_PROVIDER_SITE_OTHER): Payer: PPO | Admitting: Podiatry

## 2023-08-07 DIAGNOSIS — M79674 Pain in right toe(s): Secondary | ICD-10-CM | POA: Diagnosis not present

## 2023-08-07 DIAGNOSIS — M79675 Pain in left toe(s): Secondary | ICD-10-CM

## 2023-08-07 DIAGNOSIS — B351 Tinea unguium: Secondary | ICD-10-CM | POA: Diagnosis not present

## 2023-08-07 NOTE — Progress Notes (Signed)
       Subjective:  Patient ID: Mark Stewart, male    DOB: July 21, 1946,  MRN: [id number]  [name] presents to clinic today for:  Chief Complaint  Patient presents with   [facility]    DM Nails. Last A1c ~ 8.7   Patient notes nails are thick, discolored, elongated and painful in shoegear when trying to ambulate.    PCP is Marianne Sofia, PA-C.  Past Medical History:  Diagnosis Date   Allergy    Cancer (HCC)    skin, hand   Cataract    history of surgery bilaterally   Hypercholesteremia    Hypertension    ICAO (internal carotid artery occlusion)    Spleen absent    patient states had spleen removed in high school   Stroke [facility]) 02/2016    Past Surgical History:  Procedure Laterality Date   EYE SURGERY     HERNIA REPAIR  02/28/2018   HERNIA REPAIR Bilateral 11/2019   PROSTATE SURGERY     SPLENECTOMY      Allergies  Allergen Reactions   Bactrim [Sulfamethoxazole-Trimethoprim] Hives   Benazepril Cough   Metformin And Related Rash   Penicillins Rash    Has patient had a PCN reaction causing immediate rash, facial/tongue/throat swelling, SOB or lightheadedness with hypotension: yes Has patient had a PCN reaction causing severe rash involving mucus membranes or skin necrosis: no Has patient had a PCN reaction that required hospitalization: no Has patient had a PCN reaction occurring within the last 10 years: no If all of the above answers are "NO", then may proceed with Cephalosporin use.    Review of Systems: Negative except as noted in the HPI.  Objective:  Mark Stewart is a pleasant 77 y.o. male in NAD. AAO x 3.  Vascular Examination: Capillary refill time is 3-5 seconds to toes bilateral. Palpable pedal pulses b/l LE. Digital hair present b/l.  Skin temperature gradient WNL b/l. No varicosities b/l. No cyanosis noted b/l.   Dermatological Examination: Pedal skin with normal turgor, texture and tone b/l. No open wounds. No interdigital macerations b/l.  Toenails x10 are 3mm thick, discolored, dystrophic with subungual debris. There is pain with compression of the nail plates.  They are elongated x10     Latest Ref Rng & Units 04/25/2023    9:28 AM 09/17/2022   12:00 AM  Hemoglobin A1C  Hemoglobin-A1c 4.8 - 5.6 % 8.8  7.4  C      7.4        C Corrected result   This result is from an external source.   Multiple values from one day are sorted in reverse-chronological order   Assessment/Plan: 1. Pain due to onychomycosis of toenails of both feet    The mycotic toenails were sharply debrided x10 with sterile nail nippers and a power debriding burr to decrease bulk/thickness and length.    Return in about 3 months (around 11/07/2023) for [facility]   Clerance Lav, DPM, FACFAS [facility]     [address] [place], [place] [address]                Office [phone]  Fax [phone]

## 2023-08-13 DIAGNOSIS — E118 Type 2 diabetes mellitus with unspecified complications: Secondary | ICD-10-CM | POA: Diagnosis not present

## 2023-08-19 ENCOUNTER — Telehealth: Payer: Self-pay | Admitting: Neurology

## 2023-08-19 NOTE — Telephone Encounter (Signed)
I called the patient back and LVM (ok per DPR) advising the patient that yes, we would like to see him for the appointment to discuss why he has not been using it and see what we can do to try to help him maintain compliance.  Patient does have severe sleep apnea and it is important that he treat it.  We would like to discuss this further with him at the appointment and if he is not going to continue using his CPAP then he should be aware of the potential dangers of not treating sleep apnea which we will discuss.  I left our office number for call back.

## 2023-08-19 NOTE — Telephone Encounter (Signed)
I spoke with the patient. He would like to keep the appointment on Monday to discuss. He says he hasn't used it in about 2 months. He will bring his machine and power cord. The reason why he hasn't used it is because he doesn't feel any different when he uses it vs when he doesn't.

## 2023-08-19 NOTE — Telephone Encounter (Signed)
Patient returning RN [place] phone call. Best CB #[phone]

## 2023-08-19 NOTE — Telephone Encounter (Addendum)
Pt states he reported that he has not used the CPAP in about 6 months, he'd like to know if he is ok to cancel the upcoming appointment, or if Shanda Bumps, NP would still like to see him, please call.

## 2023-08-25 ENCOUNTER — Other Ambulatory Visit: Payer: Self-pay | Admitting: Cardiovascular Disease

## 2023-08-26 ENCOUNTER — Ambulatory Visit: Payer: PPO | Admitting: Adult Health

## 2023-08-26 ENCOUNTER — Encounter: Payer: Self-pay | Admitting: Adult Health

## 2023-08-26 VITALS — BP 156/76 | HR 71 | Ht 72.0 in | Wt 217.0 lb

## 2023-08-26 DIAGNOSIS — G4733 Obstructive sleep apnea (adult) (pediatric): Secondary | ICD-10-CM

## 2023-08-26 DIAGNOSIS — Z789 Other specified health status: Secondary | ICD-10-CM

## 2023-08-26 NOTE — Patient Instructions (Addendum)
Your Plan:  Would highly recommend restarting nightly use of CPAP for adequate sleep apnea management as untreated sleep apnea increases risk of cardiovascular disease such as congestive heart failure, difficult to treat hypertension, cardiac arrhythmias, or stroke. Even type 2 diabetes has, in part, been linked to untreated OSA. Symptoms of untreated OSA include daytime sleepiness, memory problems, mood irritability and mood disorder such as depression and anxiety, lack of energy, as well as recurrent headaches, especially morning headaches  Continue to follow with your DME company [employer] for any needed supplies or CPAP related concerns      Follow up with Dr. Frances Furbish in 4-6 months or call earlier if needed       Thank you for coming to see [place] at [facility]. I hope we have been able to provide you high quality care today.  You may receive a patient satisfaction survey over the next few weeks. We would appreciate your feedback and comments so that we may continue to improve ourselves and the health of our patients.

## 2023-08-26 NOTE — Progress Notes (Signed)
[facility] [address] [place]. [place] [address] [phone]       OFFICE FOLLOW UP NOTE  [name] Date of Birth:  12/30/1945 Medical Record Number:  [id number]   Primary neurologist: Dr. Frances Furbish Reason for visit: CPAP follow-up    SUBJECTIVE:   CHIEF COMPLAINT:  Chief Complaint  Patient presents with   Follow-up    Pt with wife, rm 2. He has not been using CPAP since July. He states he didn't feel that it was working well. He mentioned was aggravating with using the machine. DME [employer]. Pt states just didn't understand the reasoning for needing to use it.      Follow-up visit:  Prior visit: 09/05/2019 with Dr. Frances Furbish  Brief HPI:   Mark Stewart is a 77 y.o. male with PMH of hx of stroke in 2017, HTN, HLD, DM and longstanding history of sleep apnea managed on CPAP therapy.  Prior HST 2021 which showed severe OSA with total AHI of 51.7/h and O2 nadir of 86%.  Difficulty tolerating CPAP therefore stopped to use, discussed other treatment options with Dr. Frances Furbish including Mark Stewart and recommended giving CPAP another chance.  Dr. Frances Furbish recommended proceeding with titration study, completed on 05/2022 which showed resolution of OSA under CPAP therapy and placed on pressure setting of 9.   At prior visit, noted compliance and optimal residual AHI.  Noted tolerating CPAP well with Evora fullface mask, noted continued sleepiness during the day.  ESS 20/24.     Interval history:  Accompanied by his wife today. Patient has not used CPAP machine since mid July as he did not feel any benefit with use.  CPAP compliance report noted below prior to discontinuing. Also notes difficulty sleeping well with it, had difficulty keeping mask on and being comfortable with it. Per wife, he lacks motivation in general to use CPAP and does not keep up with routine maintenance such as cleaning. Long discussion regarding risks of untreated sleep apnea especially with  prior stroke history. He is willing to restart CPAP based on increased risk of untreated apnea.            ROS:   14 system review of systems performed and negative with exception of those listed in HPI  PMH:  Past Medical History:  Diagnosis Date   Allergy    Cancer (HCC)    skin, hand   Cataract    history of surgery bilaterally   Hypercholesteremia    Hypertension    ICAO (internal carotid artery occlusion)    Spleen absent    patient states had spleen removed in high school   Stroke [facility]) 02/2016    PSH:  Past Surgical History:  Procedure Laterality Date   EYE SURGERY     HERNIA REPAIR  02/28/2018   HERNIA REPAIR Bilateral 11/2019   PROSTATE SURGERY     SPLENECTOMY      Social History:  Social History   Socioeconomic History   Marital status: Married    Spouse name: graye   Number of children: Not on file   Years of education: Not on file   Highest education level: Not on file  Occupational History   Not on file  Tobacco Use   Smoking status: Former    Current packs/day: 1.00    Average packs/day: 1 pack/day for 20.0 years (20.0 ttl pk-yrs)    Types: Cigarettes   Smokeless tobacco: Never   Tobacco comments:    patient  states last cigarette 35 years ago  Substance and Sexual Activity   Alcohol use: No   Drug use: No   Sexual activity: Not Currently  Other Topics Concern   Not on file  Social History Narrative   Lives with wife   Right Handed   Drinks 4-5 cups caffeine daily   Social Determinants of Health   Financial Resource Strain: Low Risk  (06/18/2023)   Overall Financial Resource Strain (CARDIA)    Difficulty of Paying Living Expenses: Not hard at all  Food Insecurity: No Food Insecurity (06/18/2023)   Hunger Vital Sign    Worried About Running Out of Food in the Last Year: Never true    Ran Out of Food in the Last Year: Never true  Transportation Needs: No Transportation Needs (06/18/2023)   PRAPARE - Scientist, research (physical sciences) (Medical): No    Lack of Transportation (Non-Medical): No  Physical Activity: Inactive (06/18/2023)   Exercise Vital Sign    Days of Exercise per Week: 0 days    Minutes of Exercise per Session: 0 min  Stress: No Stress Concern Present (06/18/2023)   [employer] of Occupational Health - Occupational Stress Questionnaire    Feeling of Stress : Not at all  Social Connections: Socially Integrated (06/18/2023)   Social Connection and Isolation Panel [NHANES]    Frequency of Communication with Friends and Family: More than three times a week    Frequency of Social Gatherings with Friends and Family: More than three times a week    Attends Religious Services: More than 4 times per year    Active Member of [employer] or Organizations: Yes    Attends Engineer, structural: More than 4 times per year    Marital Status: Married  Catering manager Violence: Not At Risk (06/18/2023)   Humiliation, Afraid, Rape, and Kick questionnaire    Fear of Current or Ex-Partner: No    Emotionally Abused: No    Physically Abused: No    Sexually Abused: No    Family History:  Family History  Problem Relation Age of Onset   Breast cancer Mother    Diabetes Mellitus II Father    Breast cancer Sister    Sleep apnea Neg Hx     Medications:   Current Outpatient Medications on File Prior to Visit  Medication Sig Dispense Refill   amLODipine (NORVASC) 10 MG tablet TAKE 1 TABLET BY MOUTH EVERY DAY 90 tablet 0   aspirin EC 81 MG tablet Take 81 mg by mouth daily.     atorvastatin (LIPITOR) 80 MG tablet Take 1 tablet (80 mg total) by mouth daily. 90 tablet 1   B-D UF III MINI PEN NEEDLES 31G X 5 MM MISC      buPROPion (WELLBUTRIN XL) 150 MG 24 hr tablet Take 150 mg by mouth daily.     carvedilol (COREG) 25 MG tablet Take 1 tablet (25 mg total) by mouth 2 (two) times daily with a meal. 120 tablet 5   Cholecalciferol (VITAMIN D3 PO) Take by mouth daily.     cloNIDine (CATAPRES - DOSED IN  MG/24 HR) 0.2 mg/24hr patch UNWRAP AND APPLY 1 PATCH TO SKIN WEEKLY 12 patch 1   empagliflozin (JARDIANCE) 25 MG TABS tablet Take 25 mg by mouth daily.      fluticasone (FLONASE) 50 MCG/ACT nasal spray PLACE 2 SPRAYS INTO BOTH NOSTRILS DAILY AS NEEDED FOR ALLERGIES OR RHINITIS. REPORTED ON 04/26/2016 48 mL 1  Insulin Disposable Pump (OMNIPOD DASH 5 PACK PODS) MISC      minoxidil (LONITEN) 10 MG tablet TAKE 1/2 TABLET BY MOUTH EVERY DAY 45 tablet 2   montelukast (SINGULAIR) 10 MG tablet TAKE 1 TABLET BY MOUTH EVERYDAY AT BEDTIME 90 tablet 1   NOVOLOG 100 UNIT/ML injection USE AS DIRECTED IN INSULIN PUMP. MDD: 100UNITS/DAY     omeprazole (PRILOSEC) 40 MG capsule TAKE 1 CAPSULE BY MOUTH EVERY DAY 90 capsule 2   spironolactone (ALDACTONE) 50 MG tablet TAKE 1 TABLET (50 MG TOTAL) BY MOUTH DAILY. SCHEDULE OFFICE VISIT FOR FUTURE REFILLS. 90 tablet 3   tamsulosin (FLOMAX) 0.4 MG CAPS capsule Take 0.4 mg by mouth daily after supper. Take 1 tablet 1/2 hour after supper     valsartan (DIOVAN) 320 MG tablet Take 1 tablet (320 mg total) by mouth daily. 90 tablet 2   No current facility-administered medications on file prior to visit.    Allergies:   Allergies  Allergen Reactions   Bactrim [Sulfamethoxazole-Trimethoprim] Hives   Benazepril Cough   Metformin And Related Rash   Penicillins Rash    Has patient had a PCN reaction causing immediate rash, facial/tongue/throat swelling, SOB or lightheadedness with hypotension: yes Has patient had a PCN reaction causing severe rash involving mucus membranes or skin necrosis: no Has patient had a PCN reaction that required hospitalization: no Has patient had a PCN reaction occurring within the last 10 years: no If all of the above answers are "NO", then may proceed with Cephalosporin use.       OBJECTIVE:  Physical Exam  Vitals:   08/26/23 1128 08/26/23 1131  BP: (!) 182/92 (!) 156/76  Pulse: 71   Weight: 217 lb (98.4 kg)   Height: 6' (1.829 m)     Body mass index is 29.43 kg/m. No results found.  General: well developed, well nourished, very pleasant elderly Caucasian male, seated, in no evident distress  Neurologic Exam Mental Status: Awake and fully alert. Fluent speech and language. Oriented to place and time. Recent and remote memory intact. Attention span, concentration and fund of knowledge appropriate. Mood and affect appropriate.  Cranial Nerves: Pupils equal, briskly reactive to light. Extraocular movements full without nystagmus. Visual fields full to confrontation. Hearing intact. Facial sensation intact. Face, tongue, palate moves normally and symmetrically.  Motor: Normal bulk and tone. Normal strength in right upper and lower extremity, mild left sided weakness post stroke Sensory.: intact to touch , pinprick , position and vibratory sensation.  Coordination: Rapid alternating movements normal in all extremities. Finger-to-nose and heel-to-shin performed accurately bilaterally. Gait and Station: Arises from chair without difficulty. Stance is normal. Gait demonstrates normal stride length and balance without use of AD Reflexes: 1+ and symmetric. Toes downgoing.         ASSESSMENT/PLAN: Mark Stewart is a 77 y.o. year old male    OSA on CPAP : Patient has not used CPAP since 7/20 as no significant benefit and difficulty tolerating. Discussed the risks and ramifications of untreated moderate to severe OSA, especially with respect to cardiovascular disease down the road, including congestive heart failure, difficult to treat hypertension, cardiac arrhythmias, or stroke, especially with history of stroke in 2017. Even type 2 diabetes has, in part, been linked to untreated OSA. Symptoms of untreated OSA include daytime sleepiness, memory problems, mood irritability and mood disorder such as depression and anxiety, lack of energy, as well as recurrent headaches, especially morning headaches. He is agreeable to restart  therapy at  this time, also discussed importance of routine cleaning. Previously discussed Inspire device with Dr. Frances Furbish but wanted to give CPAP another try first. Will follow up with Dr. Frances Furbish in 4-6 months to further discuss this if he still has difficulty tolerating. Continue to follow with DME company for any needed supplies or CPAP related concerns     Follow up in 4-6 months or call earlier if needed   CC:  PCP: Marianne Sofia, PA-C    I spent 30 minutes of face-to-face and non-face-to-face time with patient and wife.  This included previsit chart review, lab review, study review, order entry, electronic health record documentation, patient education and discussion regarding above diagnoses and treatment plan and answered all other questions to patient's satisfaction  Ihor Austin, [facility]  [facility] [address] [place], [place] [address]  Phone [phone] Fax [phone] Note: This document was prepared with digital dictation and possible smart phrase technology. Any transcriptional errors that result from this process are unintentional.

## 2023-08-28 ENCOUNTER — Encounter: Payer: Self-pay | Admitting: Physician Assistant

## 2023-08-28 ENCOUNTER — Ambulatory Visit: Payer: PPO | Admitting: Physician Assistant

## 2023-08-28 VITALS — BP 158/94 | HR 83 | Temp 97.1°F | Ht 72.0 in | Wt 213.0 lb

## 2023-08-28 DIAGNOSIS — Z23 Encounter for immunization: Secondary | ICD-10-CM

## 2023-08-28 DIAGNOSIS — E559 Vitamin D deficiency, unspecified: Secondary | ICD-10-CM | POA: Diagnosis not present

## 2023-08-28 DIAGNOSIS — Z794 Long term (current) use of insulin: Secondary | ICD-10-CM

## 2023-08-28 DIAGNOSIS — E785 Hyperlipidemia, unspecified: Secondary | ICD-10-CM | POA: Diagnosis not present

## 2023-08-28 DIAGNOSIS — I152 Hypertension secondary to endocrine disorders: Secondary | ICD-10-CM | POA: Diagnosis not present

## 2023-08-28 DIAGNOSIS — E1169 Type 2 diabetes mellitus with other specified complication: Secondary | ICD-10-CM | POA: Diagnosis not present

## 2023-08-28 DIAGNOSIS — F331 Major depressive disorder, recurrent, moderate: Secondary | ICD-10-CM

## 2023-08-28 DIAGNOSIS — E1165 Type 2 diabetes mellitus with hyperglycemia: Secondary | ICD-10-CM

## 2023-08-28 DIAGNOSIS — E1159 Type 2 diabetes mellitus with other circulatory complications: Secondary | ICD-10-CM

## 2023-08-28 NOTE — Progress Notes (Signed)
Subjective:  Patient ID: Mark Stewart, male    DOB: April 26, 1946  Age: 77 y.o. MRN: [id number]  Chief Complaint  Patient presents with   Medical Management of Chronic Issues    HPI   Pt presents for follow up of hypertension. Patient has had hypertension for more than 20 years.  He states that this is managed by cardiology.  He is on multiple medications including norvasc 10, coreg 25, clonidine patch, hctz 25mg , minoxidil 10, aldactone 50 and diovan 320 He is unsure exactly of meds so we will send home his med list and he will call back to verify His bp is elevated today so will wait to adjust medications after verification of correct meds/dosing and have him recheck bp in one month His cardiologist is Dr Royann Shivers and follows regularly  Pt with longstanding history of almost 30 years of diabetes.  He follows with Dr Allena Katz endocrinologist regularly.  His last hgb A1c was improved at 7.8 -- He is currently on Novolog through [facility] and Jardiance 25mg  He states his glucose readings have been good when he checks them He is up to date on eye exam   Pt with GERD - stable on prilosec 40mg  qd  Pt with mild  depression and currently on wellbutrin daily - states med is working well for him  Pt with history of hematuria - he has seen Dr Saddie Benders and had full workup done recently including ct of abdomen and cystoscopy per patient - states all tests were negative and has been started on flomax qd and had follow up with him earlier this week  Pt with obstructive sleep apnea and does use CPAP  Pt had vit D def but currently not on med - due for labwork  Pt would like flu shot and COVID vaccine today  Current Outpatient Medications on File Prior to Visit  Medication Sig Dispense Refill   amLODipine (NORVASC) 10 MG tablet TAKE 1 TABLET BY MOUTH EVERY DAY 90 tablet 0   aspirin EC 81 MG tablet Take 81 mg by mouth daily.     atorvastatin (LIPITOR) 80 MG tablet Take 1 tablet (80 mg total) by mouth  daily. 90 tablet 1   B-D UF III MINI PEN NEEDLES 31G X 5 MM MISC      buPROPion (WELLBUTRIN XL) 150 MG 24 hr tablet Take 150 mg by mouth daily.     carvedilol (COREG) 25 MG tablet Take 1 tablet (25 mg total) by mouth 2 (two) times daily with a meal. 120 tablet 5   Cholecalciferol (VITAMIN D3 PO) Take by mouth daily.     cloNIDine (CATAPRES - DOSED IN MG/24 HR) 0.2 mg/24hr patch UNWRAP AND APPLY 1 PATCH TO SKIN WEEKLY 12 patch 1   empagliflozin (JARDIANCE) 25 MG TABS tablet Take 25 mg by mouth daily.      fluticasone (FLONASE) 50 MCG/ACT nasal spray PLACE 2 SPRAYS INTO BOTH NOSTRILS DAILY AS NEEDED FOR ALLERGIES OR RHINITIS. REPORTED ON 04/26/2016 48 mL 1   Insulin Disposable Pump (OMNIPOD DASH 5 PACK PODS) MISC      minoxidil (LONITEN) 10 MG tablet TAKE 1/2 TABLET BY MOUTH EVERY DAY 45 tablet 2   montelukast (SINGULAIR) 10 MG tablet TAKE 1 TABLET BY MOUTH EVERYDAY AT BEDTIME 90 tablet 1   NOVOLOG 100 UNIT/ML injection USE AS DIRECTED IN INSULIN PUMP. MDD: 100UNITS/DAY     omeprazole (PRILOSEC) 40 MG capsule TAKE 1 CAPSULE BY MOUTH EVERY DAY 90 capsule 2  spironolactone (ALDACTONE) 50 MG tablet TAKE 1 TABLET (50 MG TOTAL) BY MOUTH DAILY. SCHEDULE OFFICE VISIT FOR FUTURE REFILLS. 90 tablet 3   tamsulosin (FLOMAX) 0.4 MG CAPS capsule Take 0.4 mg by mouth daily after supper. Take 1 tablet 1/2 hour after supper     valsartan (DIOVAN) 320 MG tablet Take 1 tablet (320 mg total) by mouth daily. 90 tablet 2   No current facility-administered medications on file prior to visit.   Past Medical History:  Diagnosis Date   Allergy    Cancer (HCC)    skin, hand   Cataract    history of surgery bilaterally   Hypercholesteremia    Hypertension    ICAO (internal carotid artery occlusion)    Spleen absent    patient states had spleen removed in high school   Stroke [facility]) 02/2016   Past Surgical History:  Procedure Laterality Date   EYE SURGERY     HERNIA REPAIR  02/28/2018   HERNIA REPAIR Bilateral  11/2019   PROSTATE SURGERY     SPLENECTOMY      Family History  Problem Relation Age of Onset   Breast cancer Mother    Diabetes Mellitus II Father    Breast cancer Sister    Sleep apnea Neg Hx    Social History   Socioeconomic History   Marital status: Married    Spouse name: graye   Number of children: Not on file   Years of education: Not on file   Highest education level: Not on file  Occupational History   Not on file  Tobacco Use   Smoking status: Former    Current packs/day: 1.00    Average packs/day: 1 pack/day for 20.0 years (20.0 ttl pk-yrs)    Types: Cigarettes   Smokeless tobacco: Never   Tobacco comments:    patient states last cigarette 35 years ago  Substance and Sexual Activity   Alcohol use: No   Drug use: No   Sexual activity: Not Currently  Other Topics Concern   Not on file  Social History Narrative   Lives with wife   Right Handed   Drinks 4-5 cups caffeine daily   Social Determinants of Health   Financial Resource Strain: Low Risk  (06/18/2023)   Overall Financial Resource Strain (CARDIA)    Difficulty of Paying Living Expenses: Not hard at all  Food Insecurity: No Food Insecurity (06/18/2023)   Hunger Vital Sign    Worried About Running Out of Food in the Last Year: Never true    Ran Out of Food in the Last Year: Never true  Transportation Needs: No Transportation Needs (06/18/2023)   PRAPARE - Administrator, Civil Service (Medical): No    Lack of Transportation (Non-Medical): No  Physical Activity: Inactive (06/18/2023)   Exercise Vital Sign    Days of Exercise per Week: 0 days    Minutes of Exercise per Session: 0 min  Stress: No Stress Concern Present (06/18/2023)   [employer] of Occupational Health - Occupational Stress Questionnaire    Feeling of Stress : Not at all  Social Connections: Socially Integrated (06/18/2023)   Social Connection and Isolation Panel [NHANES]    Frequency of Communication with Friends and  Family: More than three times a week    Frequency of Social Gatherings with Friends and Family: More than three times a week    Attends Religious Services: More than 4 times per year    Active Member of [employer]  or Organizations: Yes    Attends Engineer, structural: More than 4 times per year    Marital Status: Married  CONSTITUTIONAL: Negative for chills, fatigue, fever, unintentional weight gain and unintentional weight loss.  E/N/T: Negative for ear pain, nasal congestion and sore throat.  CARDIOVASCULAR: Negative for chest pain, dizziness, palpitations and pedal edema.  RESPIRATORY: Negative for recent cough and dyspnea.  GASTROINTESTINAL: Negative for abdominal pain, acid reflux symptoms, constipation, diarrhea, nausea and vomiting.  MSK: Negative for arthralgias and myalgias.  INTEGUMENTARY: Negative for rash.  NEUROLOGICAL: Negative for dizziness and headaches.  PSYCHIATRIC: Negative for sleep disturbance and to question depression screen.  Negative for depression, negative for anhedonia.       Objective:  PHYSICAL EXAM:   VS: BP (!) 158/94   Pulse 83   Temp (!) 97.1 F (36.2 C) (Temporal)   Ht 6' (1.829 m)   Wt 213 lb (96.6 kg)   SpO2 97%   BMI 28.89 kg/m   GEN: Well nourished, well developed, in no acute distress  HEENT: normal external ears and nose - normal external auditory canals and TMS -  - Lips, Teeth and Gums - normal  Oropharynx - normal mucosa, palate, and posterior pharynx Neck: no JVD or masses - no thyromegaly Cardiac: RRR; no murmurs, rubs, or gallops,no edema - no significant varicosities Respiratory:  normal respiratory rate and pattern with no distress - normal breath sounds with no rales, rhonchi, wheezes or rubs GI: normal bowel sounds, no masses or tenderness MS: no deformity or atrophy  Skin: warm and dry, no rash  Neuro:  Alert and Oriented x 3, Strength and sensation are intact - CN II-Xii grossly intact Psych: euthymic mood, appropriate  affect and demeanor Diabetic Foot Exam - Simple   Simple Foot Form Diabetic Foot exam was performed with the following findings: Yes 08/28/2023 11:30 AM  Visual Inspection No deformities, no ulcerations, no other skin breakdown bilaterally: Yes Sensation Testing Intact to touch and monofilament testing bilaterally: Yes Pulse Check Posterior Tibialis and Dorsalis pulse intact bilaterally: Yes Comments Left foot 2nd toe with scrape that has been there for two weeks.     Lab Results  Component Value Date   WBC 6.5 04/25/2023   HGB 15.1 04/25/2023   HCT 46.4 04/25/2023   PLT 295 04/25/2023   GLUCOSE 154 (H) 04/25/2023   CHOL 126 04/25/2023   TRIG 56 04/25/2023   HDL 38 (L) 04/25/2023   LDLCALC 76 04/25/2023   ALT 17 04/25/2023   AST 13 04/25/2023   NA 141 04/25/2023   K 4.6 04/25/2023   CL 105 04/25/2023   CREATININE 1.40 (H) 04/25/2023   BUN 23 04/25/2023   CO2 21 04/25/2023   TSH 2.640 04/25/2023   HGBA1C 8.8 (H) 04/25/2023   MICROALBUR 150 03/21/2021      Assessment & Plan:   Problem List Items Addressed This Visit       Cardiovascular and Mediastinum   Hypertension associated with diabetes (HCC)   Relevant Orders   CBC with Differential/Platelet   Comprehensive metabolic panel   Lipid panel Continue current meds Call back with correct meds/dosing Follow up with cardiology as directed Recheck bp in one month     Endocrine   Diabetes mellitus with hyperglycemia, with long-term current use of insulin (HCC) - Primary   Relevant Orders      CBC with Differential/Platelet   Comprehensive metabolic panel   Lipid panel A1c Continue meds  Other   Hyperlipidemia associated with diabetes (HCC)   Relevant Orders   CBC with Differential/Platelet   Comprehensive metabolic panel   Lipid panel Continue meds      Need flu shot Trivalent fluad given  Need COVID vaccine COVID booster given      Vit D Def Vit D level pending    Major depressive  disorder, recurrent episode, moderate (HCC) Continue wellbutrin    .  No orders of the defined types were placed in this encounter.   Orders Placed This Encounter  Procedures      CBC with Differential/Platelet   Comprehensive metabolic panel   Lipid panel              Follow-up: Return in about 4 months (around 12/26/2023) for chronic fasting follow-up - nurse visit in one month.  An After Visit Summary was printed and given to the patient.  Jettie Pagan [facility] [phone]

## 2023-08-29 ENCOUNTER — Other Ambulatory Visit: Payer: Self-pay | Admitting: Cardiovascular Disease

## 2023-08-29 LAB — CBC WITH DIFFERENTIAL/PLATELET
Basophils Absolute: 0 10*3/uL (ref 0.0–0.2)
Basos: 1 %
EOS (ABSOLUTE): 0.4 10*3/uL (ref 0.0–0.4)
Eos: 6 %
Hematocrit: 50.6 % (ref 37.5–51.0)
Hemoglobin: 16.8 g/dL (ref 13.0–17.7)
Immature Grans (Abs): 0 10*3/uL (ref 0.0–0.1)
Immature Granulocytes: 0 %
Lymphocytes Absolute: 1.3 10*3/uL (ref 0.7–3.1)
Lymphs: 19 %
MCH: 31.2 pg (ref 26.6–33.0)
MCHC: 33.2 g/dL (ref 31.5–35.7)
MCV: 94 fL (ref 79–97)
Monocytes Absolute: 0.8 10*3/uL (ref 0.1–0.9)
Monocytes: 12 %
Neutrophils Absolute: 4.4 10*3/uL (ref 1.4–7.0)
Neutrophils: 62 %
Platelets: 330 10*3/uL (ref 150–450)
RBC: 5.39 x10E6/uL (ref 4.14–5.80)
RDW: 12.9 % (ref 11.6–15.4)
WBC: 6.9 10*3/uL (ref 3.4–10.8)

## 2023-08-29 LAB — TSH: TSH: 1.9 u[IU]/mL (ref 0.450–4.500)

## 2023-08-29 LAB — LIPID PANEL
Chol/HDL Ratio: 4.8 ratio (ref 0.0–5.0)
Cholesterol, Total: 186 mg/dL (ref 100–199)
HDL: 39 mg/dL — ABNORMAL LOW (ref >39)
LDL Chol Calc (NIH): 123 mg/dL — ABNORMAL HIGH (ref 0–99)
Triglycerides: 133 mg/dL (ref 0–149)
VLDL Cholesterol Cal: 24 mg/dL (ref 5–40)

## 2023-08-29 LAB — COMPREHENSIVE METABOLIC PANEL
ALT: 12 [IU]/L (ref 0–44)
AST: 12 [IU]/L (ref 0–40)
Albumin: 3.9 g/dL (ref 3.8–4.8)
Alkaline Phosphatase: 94 [IU]/L (ref 44–121)
BUN/Creatinine Ratio: 18 (ref 10–24)
BUN: 18 mg/dL (ref 8–27)
Bilirubin Total: 0.5 mg/dL (ref 0.0–1.2)
CO2: 23 mmol/L (ref 20–29)
Calcium: 9.6 mg/dL (ref 8.6–10.2)
Chloride: 100 mmol/L (ref 96–106)
Creatinine, Ser: 1.01 mg/dL (ref 0.76–1.27)
Globulin, Total: 2.6 g/dL (ref 1.5–4.5)
Glucose: 97 mg/dL (ref 70–99)
Potassium: 4.4 mmol/L (ref 3.5–5.2)
Sodium: 139 mmol/L (ref 134–144)
Total Protein: 6.5 g/dL (ref 6.0–8.5)
eGFR: 77 mL/min/{1.73_m2} (ref >59)

## 2023-08-29 LAB — VITAMIN D 25 HYDROXY (VIT D DEFICIENCY, FRACTURES): Vit D, 25-Hydroxy: 25.1 ng/mL — ABNORMAL LOW (ref 30.0–100.0)

## 2023-08-30 ENCOUNTER — Ambulatory Visit: Payer: Self-pay | Admitting: Physician Assistant

## 2023-08-30 NOTE — Telephone Encounter (Signed)
Copied from CRM [id number]. Topic: Clinical - Red Word Triage >> Aug 30, 2023  3:45 PM Chantha C wrote: Red Word that prompted transfer to Nurse Triage: Burning rash going into the head more to the right and going into the eye, feels swelling not bad, started about a week getting worse.   Chief Complaint: Rash on head Symptoms: Red rash on above eyebrow Frequency: started 3 days ago Pertinent Negatives: Patient denies fever, itching, or pain  Disposition: [] ED /[] Urgent Care (no appt availability in office) / [x] Appointment(In office/virtual)/ []  [facility]/ [] Home Care/ [] Refused Recommended Disposition /[] [facility]/ []  Follow-up with PCP Additional Notes: Patient reports rash or irritated area above eyebrow extending into forehead.     Reason for Disposition  Mild widespread rash  (Exception: Heat rash lasting 3 days or less.)  Answer Assessment - Initial Assessment Questions 1. APPEARANCE of RASH: "Describe the rash." (e.g., spots, blisters, raised areas, skin peeling, scaly)     States that it doesn't have much of an appearance, looks like its scraped. Looks like a "road burn"  2. SIZE: "How big are the spots?" (e.g., tip of pen, eraser, coin; inches, centimeters)     One area that is extending  3. LOCATION: "Where is the rash located?"     Above left eye brow extending to the hair line  4. COLOR: "What color is the rash?" (Note: It is difficult to assess rash color in people with darker-colored skin. When this situation occurs, simply ask the caller to describe what they see.)     Looks a little red  5. ONSET: "When did the rash begin?"     Started 3 days ago  6. FEVER: "Do you have a fever?" If Yes, ask: "What is your temperature, how was it measured, and when did it start?"     No fevers  7. ITCHING: "Does the rash itch?" If Yes, ask: "How bad is the itch?" (Scale 1-10; or mild, moderate, severe)     No itching, but hurts to touch  8. CAUSE: "What  do you think is causing the rash?"     Thinks he may have bumped into something  9. MEDICINE FACTORS: "Have you started any new medicines within the last 2 weeks?" (e.g., antibiotics)      No  10. OTHER SYMPTOMS: "Do you have any other symptoms?" (e.g., dizziness, headache, sore throat, joint pain)      Arms have been aching  Protocols used: Rash or Redness - [facility]

## 2023-08-31 ENCOUNTER — Other Ambulatory Visit: Payer: Self-pay | Admitting: Cardiovascular Disease

## 2023-08-31 DIAGNOSIS — B009 Herpesviral infection, unspecified: Secondary | ICD-10-CM | POA: Diagnosis not present

## 2023-09-02 ENCOUNTER — Ambulatory Visit: Payer: PPO | Admitting: Physician Assistant

## 2023-09-02 ENCOUNTER — Ambulatory Visit (INDEPENDENT_AMBULATORY_CARE_PROVIDER_SITE_OTHER): Payer: PPO | Admitting: Physician Assistant

## 2023-09-02 ENCOUNTER — Encounter: Payer: Self-pay | Admitting: Physician Assistant

## 2023-09-02 VITALS — BP 170/84 | HR 86 | Temp 97.5°F | Ht 72.0 in | Wt 212.0 lb

## 2023-09-02 DIAGNOSIS — I10 Essential (primary) hypertension: Secondary | ICD-10-CM | POA: Diagnosis not present

## 2023-09-02 DIAGNOSIS — B029 Zoster without complications: Secondary | ICD-10-CM | POA: Insufficient documentation

## 2023-09-02 MED ORDER — PREDNISONE 10 MG PO TABS: ORAL_TABLET | ORAL | 0 refills | Status: DC

## 2023-09-02 NOTE — Progress Notes (Signed)
Acute Office Visit  Subjective:    Patient ID: Mark Stewart, male    DOB: 20-Apr-1946, 77 y.o.   MRN: [id number]  Chief Complaint  Patient presents with   Herpes Zoster    HPI: Patient is in today for follow up of rash - he states that he broke out in rash on left side of his forehead last week and went to urgent care on Saturday and was diagnosed with shingles. He was given rx for valtrex only.   He says overall he is doing some better but still some tingling  He has no vision issues or eye pain - has had some ear pain  Bp is elevated again today - he is unsure of his medications and was told last week to bring his list or meds in so we can review - he states he will bring back to [place] so we can adjust his medications  Current Outpatient Medications:    amLODipine (NORVASC) 10 MG tablet, TAKE 1 TABLET BY MOUTH EVERY DAY, Disp: 90 tablet, Rfl: 0   aspirin EC 81 MG tablet, Take 81 mg by mouth daily., Disp: , Rfl:    predniSONE (DELTASONE) 10 MG tablet, 1 po tid for 3 days then 1 po bid for 3 days then 1 po qd for 3 days, Disp: 18 tablet, Rfl: 0   valACYclovir (VALTREX) 1000 MG tablet, Take 1,000 mg by mouth 3 (three) times daily., Disp: , Rfl:    atorvastatin (LIPITOR) 80 MG tablet, Take 1 tablet (80 mg total) by mouth daily., Disp: 90 tablet, Rfl: 1   B-D UF III MINI PEN NEEDLES 31G X 5 MM MISC, , Disp: , Rfl:    buPROPion (WELLBUTRIN XL) 150 MG 24 hr tablet, Take 150 mg by mouth daily., Disp: , Rfl:    carvedilol (COREG) 25 MG tablet, Take 1 tablet (25 mg total) by mouth 2 (two) times daily with a meal., Disp: 120 tablet, Rfl: 5   Cholecalciferol (VITAMIN D3 PO), Take by mouth daily., Disp: , Rfl:    cloNIDine (CATAPRES - DOSED IN MG/24 HR) 0.2 mg/24hr patch, APPLY 1 PATCH TO SKIN WEEKLY - PLEASE KEEP UPCOMING APPT IN DEC FOR FURTHER REFILLS, Disp: 4 patch, Rfl: 0   empagliflozin (JARDIANCE) 25 MG TABS tablet, Take 25 mg by mouth daily. , Disp: , Rfl:    fluticasone (FLONASE) 50  MCG/ACT nasal spray, PLACE 2 SPRAYS INTO BOTH NOSTRILS DAILY AS NEEDED FOR ALLERGIES OR RHINITIS. REPORTED ON 04/26/2016, Disp: 48 mL, Rfl: 1   Insulin Disposable Pump (OMNIPOD DASH 5 PACK PODS) MISC, , Disp: , Rfl:    minoxidil (LONITEN) 10 MG tablet, TAKE 1/2 TABLET BY MOUTH EVERY DAY, Disp: 45 tablet, Rfl: 2   montelukast (SINGULAIR) 10 MG tablet, TAKE 1 TABLET BY MOUTH EVERYDAY AT BEDTIME, Disp: 90 tablet, Rfl: 1   NOVOLOG 100 UNIT/ML injection, USE AS DIRECTED IN INSULIN PUMP. MDD: 100UNITS/DAY, Disp: , Rfl:    omeprazole (PRILOSEC) 40 MG capsule, TAKE 1 CAPSULE BY MOUTH EVERY DAY, Disp: 90 capsule, Rfl: 2   spironolactone (ALDACTONE) 50 MG tablet, TAKE 1 TABLET (50 MG TOTAL) BY MOUTH DAILY. SCHEDULE OFFICE VISIT FOR FUTURE REFILLS., Disp: 90 tablet, Rfl: 3   tamsulosin (FLOMAX) 0.4 MG CAPS capsule, Take 0.4 mg by mouth daily after supper. Take 1 tablet 1/2 hour after supper, Disp: , Rfl:    valsartan (DIOVAN) 320 MG tablet, Take 1 tablet (320 mg total) by mouth daily., Disp: 90 tablet, Rfl:  2  Allergies  Allergen Reactions   Bactrim [Sulfamethoxazole-Trimethoprim] Hives   Benazepril Cough   Metformin And Related Rash   Penicillins Rash    Has patient had a PCN reaction causing immediate rash, facial/tongue/throat swelling, SOB or lightheadedness with hypotension: yes Has patient had a PCN reaction causing severe rash involving mucus membranes or skin necrosis: no Has patient had a PCN reaction that required hospitalization: no Has patient had a PCN reaction occurring within the last 10 years: no If all of the above answers are "NO", then may proceed with Cephalosporin use.     ROS CONSTITUTIONAL: Negative for chills, fatigue, fever,  E/N/T: has had mild ear pain CARDIOVASCULAR: Negative for chest pain, dizziness, palpitations and pedal edema.  RESPIRATORY: Negative for recent cough and dyspnea.  INTEGUMENTARY: see HPI     Objective:    PHYSICAL EXAM:   BP (!) 170/84   Pulse  86   Temp (!) 97.5 F (36.4 C)   Ht 6' (1.829 m)   Wt 212 lb (96.2 kg)   SpO2 95%   BMI 28.75 kg/m    GEN: Well nourished, well developed, in no acute distress  HEENT - TMS and canals clear - pharynx normal Cardiac: RRR; no murmurs,  Respiratory:  normal respiratory rate and pattern with no distress - normal breath sounds with no rales, rhonchi, wheezes or rubs Skin: scabbed/blistered rash noted on left side of face     Assessment & Plan:    Herpes zoster without complication -     predniSONE; 1 po tid for 3 days then 1 po bid for 3 days then 1 po qd for 3 days  Dispense: 18 tablet; Refill: 0 Continue valtrex Recommend to make appt with his eye doctor since rash is just above his left eye   Hypertension Bring in med list -- so bp meds can be adjusted Follow-up: Return if symptoms worsen or fail to improve. - bring in list of medications so we can adjust for elevated bp   An After Visit Summary was printed and given to the patient.  Jettie Pagan [facility] [phone]

## 2023-09-04 ENCOUNTER — Telehealth: Payer: Self-pay

## 2023-09-04 NOTE — Telephone Encounter (Signed)
Patient called back and stated that he has not been taking his blood pressure medication as he should  and he does not want to adjust his medication at this time due to him not taking it as directed. He will take it as direct and keep a track of it for a couple weeks and call office with BP reading to notified provider. Also his wife will get a copy of his medication list to the office sometime today.

## 2023-09-04 NOTE — Telephone Encounter (Signed)
I have called patient back left voicemail for him to call office back, so we can discuss his concern.  Copied from CRM [id number]. Topic: Clinical - Medical Advice >> Sep 04, 2023 12:30 PM Almira Coaster wrote: Reason for CRM: Patient would like to follow up on his blood pressure medication. He feels like he hasn't been taking his medication correctly and would like for non of his medication to be changed until he is back on his normal routine for a week or two.

## 2023-09-12 DIAGNOSIS — E118 Type 2 diabetes mellitus with unspecified complications: Secondary | ICD-10-CM | POA: Diagnosis not present

## 2023-09-16 ENCOUNTER — Telehealth: Payer: Self-pay

## 2023-09-16 NOTE — Telephone Encounter (Signed)
Called patient to go over his medication list, left message to call office back

## 2023-09-16 NOTE — Telephone Encounter (Signed)
Patient stated that is not taking Omeprazole and Wellbutrin. Also wanted to know if provider thinks he should let heart doctor control all his blood pressure medications? There is some that PCP has him on and heart doctor and thought it would be easier if one doctor control his blood pressure medication.

## 2023-09-17 NOTE — Telephone Encounter (Signed)
Left patient detailed message.

## 2023-09-17 NOTE — Telephone Encounter (Signed)
Let pt know he can discuss with cardiologist if he would like them to manage bp meds ---- when pt has been in our office bp has been elevated and he had no idea what medications he was taking and just yesterday finally brought in correct list of medications He needs to go ahead and make follow up with cardiologist if that is what he would like to do

## 2023-09-18 ENCOUNTER — Ambulatory Visit: Payer: PPO | Attending: Cardiovascular Disease | Admitting: Cardiovascular Disease

## 2023-09-18 NOTE — Telephone Encounter (Signed)
Patient called back and wanted to know if he should start back taking the Wellbutrin, he don't remember when he stop it, he stated it has been a while. Denied any depression symptoms.  Recommended patient stay off of it since he don't know how long he has stop taking it and if he does start to have any depression and anxiety issue call office and make an appointment to be seen.

## 2023-09-20 ENCOUNTER — Other Ambulatory Visit: Payer: Self-pay | Admitting: Physician Assistant

## 2023-09-20 DIAGNOSIS — J4489 Other specified chronic obstructive pulmonary disease: Secondary | ICD-10-CM

## 2023-09-24 DIAGNOSIS — L570 Actinic keratosis: Secondary | ICD-10-CM | POA: Diagnosis not present

## 2023-09-24 DIAGNOSIS — L820 Inflamed seborrheic keratosis: Secondary | ICD-10-CM | POA: Diagnosis not present

## 2023-09-24 DIAGNOSIS — L821 Other seborrheic keratosis: Secondary | ICD-10-CM | POA: Diagnosis not present

## 2023-09-24 DIAGNOSIS — L578 Other skin changes due to chronic exposure to nonionizing radiation: Secondary | ICD-10-CM | POA: Diagnosis not present

## 2023-09-24 DIAGNOSIS — Z85820 Personal history of malignant melanoma of skin: Secondary | ICD-10-CM | POA: Diagnosis not present

## 2023-09-27 ENCOUNTER — Ambulatory Visit: Payer: PPO

## 2023-09-27 NOTE — Progress Notes (Signed)
Patient is in office today for a nurse visit for Blood Pressure Check. Patient blood pressure was 138/68, Patient has no symptoms. Patient instructed to follow up as scheduled.

## 2023-09-30 ENCOUNTER — Ambulatory Visit: Payer: PPO | Admitting: Cardiovascular Disease

## 2023-10-13 ENCOUNTER — Other Ambulatory Visit: Payer: Self-pay | Admitting: Cardiovascular Disease

## 2023-10-14 ENCOUNTER — Other Ambulatory Visit: Payer: Self-pay

## 2023-10-14 DIAGNOSIS — Z9641 Presence of insulin pump (external) (internal): Secondary | ICD-10-CM | POA: Diagnosis not present

## 2023-10-14 DIAGNOSIS — Z7984 Long term (current) use of oral hypoglycemic drugs: Secondary | ICD-10-CM | POA: Diagnosis not present

## 2023-10-14 DIAGNOSIS — E1142 Type 2 diabetes mellitus with diabetic polyneuropathy: Secondary | ICD-10-CM | POA: Diagnosis not present

## 2023-10-14 DIAGNOSIS — Z794 Long term (current) use of insulin: Secondary | ICD-10-CM | POA: Diagnosis not present

## 2023-10-14 DIAGNOSIS — K2100 Gastro-esophageal reflux disease with esophagitis, without bleeding: Secondary | ICD-10-CM

## 2023-10-14 DIAGNOSIS — N529 Male erectile dysfunction, unspecified: Secondary | ICD-10-CM | POA: Diagnosis not present

## 2023-10-14 DIAGNOSIS — E118 Type 2 diabetes mellitus with unspecified complications: Secondary | ICD-10-CM | POA: Diagnosis not present

## 2023-10-14 MED ORDER — OMEPRAZOLE 40 MG PO CPDR: 40.0000 mg | DELAYED_RELEASE_CAPSULE | Freq: Every day | ORAL | 2 refills | Status: DC

## 2023-10-15 DIAGNOSIS — E118 Type 2 diabetes mellitus with unspecified complications: Secondary | ICD-10-CM | POA: Diagnosis not present

## 2023-10-16 ENCOUNTER — Other Ambulatory Visit: Payer: Self-pay | Admitting: Cardiovascular Disease

## 2023-10-17 NOTE — Progress Notes (Signed)
 Cardiology Office Note:    Date:  10/21/2023  ID:  Mark Stewart, DOB 1946/01/29, MRN [id number] PCP: Nicholaus Credit, PA-C  [facility] Providers Cardiologist:  Jerel Balding, MD       Patient Profile:      Mark Stewart has a PMH of hypertension, OSA, type 2 diabetes requiring insulin , HLD, right thalamic CVA 02/2016, mild residual left lower extremity weakness.   He established care with cardiology service on 10/17/2018 with Dr. Balding for resistant HTN. Renal artery duplex US  on 08/2019 did not show evidence of renal artery stenosis. 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  He was seen in follow-up by Dr. Balding on 09/07/2022.  He presented with his wife.  He was noted to be fairly sedentary.  He continued to have mild orthostatic dizziness.  He was getting up gradually.  He did note 1 fall in the bedroom.  This was felt to be related to lower extremity weakness and left knee pain.  He denied dizziness and loss of consciousness.  He was noted to have mild lower extremity ankle edema.  He denied exertional chest discomfort, PND, palpitations, lower extremity swelling, weight gain cough, and hemoptysis.  He was tolerating minoxidil .  His blood pressure was noted to be 126/62.       History of Present Illness:  Discussed the use of AI scribe software for clinical note transcription with the patient, who gave verbal consent to proceed.  Mark Stewart is a 78 y.o. male who returns for 1 year follow-up for HTN.   He comes into clinic today with his wife. He admits to a period of non-compliance with his morning medications, including his antihypertensive and cholesterol medications, which led to elevated blood pressure and cholesterol levels. This lasted several weeks but since resumed his medications and his blood pressure has been much improved. His BP at homes averages in the 130s. He has a history of falls, which he attributes to balance  issues. He has also been non-compliant with his CPAP machine, which he finds uncomfortable. Despite not noticing a difference in his symptoms with the use of the CPAP machine, he acknowledges the potential health risks associated with untreated sleep apnea. He reports excessive daytime sleepiness and a lack of initiative, which his wife attributes to his untreated sleep apnea and the after-effects of his stroke. He notes minimal leg swelling that he experiences only when he has been on his feet. He notes leg stockings improve this however he does not wear them consisently.   He denies chest pain, shortness of breath, fatigue, palpitations, melena, hematuria, hemoptysis, diaphoresis, weakness, presyncope, syncope, orthopnea, and PND.    Review of Systems  Constitutional: Negative for weight gain and weight loss.  Cardiovascular:  Positive for leg swelling. Negative for chest pain, claudication, dyspnea on exertion, irregular heartbeat, near-syncope, orthopnea, palpitations, paroxysmal nocturnal dyspnea and syncope.  Respiratory:  Negative for cough, hemoptysis and shortness of breath.   Gastrointestinal:  Negative for abdominal pain, hematochezia and melena.  Genitourinary:  Negative for hematuria.  Neurological:  Positive for excessive daytime sleepiness. Negative for headaches and light-headedness.     See HPI     Studies Reviewed:   EKG Interpretation Date/Time:  Monday October 21 2023 13:41:27 EST Ventricular Rate:  61 PR Interval:  214 QRS Duration:  104 QT Interval:  452 QTC Calculation: 455 R Axis:   103  Text Interpretation: Sinus rhythm with 1st degree A-V block  Rightward axis Confirmed by Rana Dixon [phone]) on 10/21/2023 2:16:23 PM    VAS US  renal artery 08/25/2019 Renal:  Right: Normal size right kidney. Normal right Resisitive Index.         Normal cortical thickness of right kidney. No evidence of         right renal artery stenosis. RRV flow present.  Left:  Normal  size of left kidney. Normal left Resistive Index.         Normal cortical thickness of the left kidney. No evidence of         left renal artery stenosis. LRV flow present.  Mesenteric:  Normal Celiac artery and Superior Mesenteric artery findings.    There appears to be a chronic non occlusive DVT in the proximal IVC.   Echocardiogram 02/27/2016 Study Conclusions  - Left ventricle: The cavity size was mildly dilated. Wall    thickness was normal. Systolic function was normal. The estimated    ejection fraction was in the range of 55% to 60%. Doppler    parameters are consistent with abnormal left ventricular    relaxation (grade 1 diastolic dysfunction).  - Mitral valve: Calcified annulus. Mildly thickened leaflets .  Risk Assessment/Calculations:             Physical Exam:   VS:  BP (!) 132/58   Pulse 61   Ht 6' (1.829 m)   Wt 214 lb 3.2 oz (97.2 kg)   SpO2 96%   BMI 29.05 kg/m    Wt Readings from Last 3 Encounters:  10/21/23 214 lb 3.2 oz (97.2 kg)  09/02/23 212 lb (96.2 kg)  08/28/23 213 lb (96.6 kg)    Constitutional:      Appearance: Normal and healthy appearance. Not in distress.  Neck:     Vascular: JVD normal.  Pulmonary:     Effort: Pulmonary effort is normal.     Breath sounds: Normal breath sounds.  Chest:     Chest wall: Not tender to palpatation.  Cardiovascular:     PMI at left midclavicular line. Normal rate. Regular rhythm. Normal S1. Normal S2.      Murmurs: There is no murmur.     No gallop.  No click. No rub.  Pulses:    Intact distal pulses.  Edema:    Peripheral edema absent.  Musculoskeletal: Normal range of motion.     Cervical back: Normal range of motion and neck supple. Skin:    General: Skin is warm and dry.  Neurological:     General: No focal deficit present.     Mental Status: Alert, oriented to person, place, and time and oriented to person, place and time.  Psychiatric:        Mood and Affect: Mood and affect normal.         Behavior: Behavior is cooperative.        Thought Content: Thought content normal.        Assessment and Plan:  Essential hypertension BP today 132/58 and excellent under control.  Renal artery duplex 08/2019 negative for renal artery stenosis. Maintain home blood pressure log and bring cuff into next office visit. Heart healthy low-sodium diet encouraged -Encouraged adherence to CPAP -Continue Amlodipine  10mg  daily, Carvedilol  25mg  BID, Clonidine  patch 0.2/24hr, spironolactone  50mg  daily, Valsartan  320mg  daily, Minoxidil  5mg  daily  Hyperlipidemia LDL 123 on 08/2023. Not under good control  LDL recently elevated from 76 to 123 due to non-adherence to atorvastatin . Has been taking consistently for last  month. He plans to have repeat lipid panel with PCP in 12/2023  High-fiber diet Increase physical activity as tolerated Continue aspirin , atorvastatin  Repeat fasting lipids and LFTs  Severe OSA -Not adherent. Encouraged to resume CPAP therapy to reduce risk of cardiovascular events and improve daytime fatigue. -Managed by neurology   History of CVA Hx of stroke in 2017 -continues with left lower extremity weakness.  No evidence of atrial fibrillation.  Denies accelerated or irregular heartbeat. Continue aspirin  Increase physical activity as tolerated   Type 2 diabetes A1c 8.3 on 10/14/23 -Continue insulin  therapy (insulin  pump in place) and Jardiance 25mg  -Carb modified diet -Managed by Endo           Dispo:  Return in about 1 year (around 10/20/2024).  Signed, Lum LITTIE Louis, NP

## 2023-10-21 ENCOUNTER — Encounter: Payer: Self-pay | Admitting: General Practice

## 2023-10-21 ENCOUNTER — Ambulatory Visit: Payer: PPO | Attending: General Practice | Admitting: Emergency Medicine

## 2023-10-21 VITALS — BP 132/58 | HR 61 | Ht 72.0 in | Wt 214.2 lb

## 2023-10-21 DIAGNOSIS — I701 Atherosclerosis of renal artery: Secondary | ICD-10-CM | POA: Diagnosis not present

## 2023-10-21 DIAGNOSIS — I10 Essential (primary) hypertension: Secondary | ICD-10-CM

## 2023-10-21 DIAGNOSIS — I6521 Occlusion and stenosis of right carotid artery: Secondary | ICD-10-CM

## 2023-10-21 NOTE — Patient Instructions (Signed)
 Medication Instructions:  The current medical regimen is effective;  continue present plan and medications as directed. Please refer to the Current Medication list given to you today.  *If you need a refill on your cardiac medications before your next appointment, please call your pharmacy*  Lab Work: NONE  Testing/Procedures: NONE  Follow-Up: At [facility], you and your health needs are our priority.  As part of our continuing mission to provide you with exceptional heart care, we have created designated Provider Care Teams.  These Care Teams include your primary Cardiologist (physician) and Advanced Practice Providers (APPs -  Physician Assistants and Nurse Practitioners) who all work together to provide you with the care you need, when you need it.  We recommend signing up for the patient portal called MyChart.  Sign up information is provided on this After Visit Summary.  MyChart is used to connect with patients for Virtual Visits (Telemedicine).  Patients are able to view lab/test results, encounter notes, upcoming appointments, etc.  Non-urgent messages can be sent to your provider as well.   To learn more about what you can do with MyChart, go to [email or web address].    Your next appointment:   12 month(s)  Provider:   Jerel Balding, MD     Other Instructions

## 2023-11-06 ENCOUNTER — Encounter: Payer: PPO | Admitting: Podiatry

## 2023-11-06 NOTE — Progress Notes (Signed)
Patient did not show for his scheduled appointment this morning

## 2023-11-07 ENCOUNTER — Ambulatory Visit: Payer: PPO | Admitting: Podiatry

## 2023-11-11 DIAGNOSIS — N200 Calculus of kidney: Secondary | ICD-10-CM | POA: Diagnosis not present

## 2023-11-14 DIAGNOSIS — E118 Type 2 diabetes mellitus with unspecified complications: Secondary | ICD-10-CM | POA: Diagnosis not present

## 2023-11-24 ENCOUNTER — Other Ambulatory Visit: Payer: Self-pay | Admitting: Physician Assistant

## 2023-12-04 DIAGNOSIS — E113393 Type 2 diabetes mellitus with moderate nonproliferative diabetic retinopathy without macular edema, bilateral: Secondary | ICD-10-CM | POA: Diagnosis not present

## 2023-12-04 LAB — HM DIABETES EYE EXAM

## 2023-12-06 ENCOUNTER — Other Ambulatory Visit: Payer: Self-pay | Admitting: Physician Assistant

## 2023-12-06 DIAGNOSIS — K2100 Gastro-esophageal reflux disease with esophagitis, without bleeding: Secondary | ICD-10-CM

## 2023-12-11 DIAGNOSIS — E118 Type 2 diabetes mellitus with unspecified complications: Secondary | ICD-10-CM | POA: Diagnosis not present

## 2023-12-17 ENCOUNTER — Other Ambulatory Visit: Payer: Self-pay | Admitting: Cardiovascular Disease

## 2023-12-19 ENCOUNTER — Other Ambulatory Visit: Payer: Self-pay | Admitting: Physician Assistant

## 2023-12-19 ENCOUNTER — Telehealth: Payer: Self-pay

## 2023-12-19 DIAGNOSIS — N400 Enlarged prostate without lower urinary tract symptoms: Secondary | ICD-10-CM

## 2023-12-19 DIAGNOSIS — E782 Mixed hyperlipidemia: Secondary | ICD-10-CM

## 2023-12-19 DIAGNOSIS — I10 Essential (primary) hypertension: Secondary | ICD-10-CM

## 2023-12-19 DIAGNOSIS — E559 Vitamin D deficiency, unspecified: Secondary | ICD-10-CM

## 2023-12-19 DIAGNOSIS — E1165 Type 2 diabetes mellitus with hyperglycemia: Secondary | ICD-10-CM

## 2023-12-19 NOTE — Telephone Encounter (Signed)
 I left a message on the number(s) listed in the patients chart requesting the patient to call back regarding the upcomming appointment for 12/27/2023. The provider is out of the office that day. The appointment has been canceled. Waiting for the patient to return the call.

## 2023-12-19 NOTE — Telephone Encounter (Signed)
 Copied from CRM [id number]. Topic: Appointments - Appointment Info/Confirmation >> Dec 19, 2023  1:06 PM Clayton Bibles wrote: Patient/patient representative is calling for information regarding an appointment.  Matthews had to reschedule his appointment to March 25 at 2 PM because he had another appointment on March 18. Margarita would like to know if he can come in before March 25 (as early as possible) to complete his labs. PA Earlene Plater did not have a early appointment in March. Please call him at [phone] to let him know. Thanks

## 2023-12-19 NOTE — Telephone Encounter (Signed)
 Patient notified. Appointment has been scheduled for lab work

## 2023-12-19 NOTE — Telephone Encounter (Signed)
 Mark Stewart is it ok for this patient to come in for lab work prior to his scheduled appointment?

## 2023-12-20 ENCOUNTER — Ambulatory Visit (INDEPENDENT_AMBULATORY_CARE_PROVIDER_SITE_OTHER): Admitting: Podiatry

## 2023-12-20 DIAGNOSIS — M79675 Pain in left toe(s): Secondary | ICD-10-CM | POA: Diagnosis not present

## 2023-12-20 DIAGNOSIS — M79674 Pain in right toe(s): Secondary | ICD-10-CM | POA: Diagnosis not present

## 2023-12-20 DIAGNOSIS — B351 Tinea unguium: Secondary | ICD-10-CM

## 2023-12-20 NOTE — Progress Notes (Signed)
       Subjective:  Patient ID: Mark Stewart, male    DOB: June 20, 1946,  MRN: [id number]  Mark Stewart presents to clinic today for:  Chief Complaint  Patient presents with   [facility]    [facility] with no callous today. Last A1c was in Jan it was 8.3 and he has an apt next month. Takes ASA 81   Patient notes nails are thick, discolored, elongated and painful in shoegear when trying to ambulate.    PCP is Marianne Sofia, PA-C.  Past Medical History:  Diagnosis Date   Allergy    Cancer (HCC)    skin, hand   Cataract    history of surgery bilaterally   Hypercholesteremia    Hypertension    ICAO (internal carotid artery occlusion)    Spleen absent    patient states had spleen removed in high school   Stroke [facility]) 02/2016    Past Surgical History:  Procedure Laterality Date   EYE SURGERY     HERNIA REPAIR  02/28/2018   HERNIA REPAIR Bilateral 11/2019   PROSTATE SURGERY     SPLENECTOMY      Allergies  Allergen Reactions   Bactrim [Sulfamethoxazole-Trimethoprim] Hives   Benazepril Cough   Metformin And Related Rash   Penicillins Rash    Has patient had a PCN reaction causing immediate rash, facial/tongue/throat swelling, SOB or lightheadedness with hypotension: yes Has patient had a PCN reaction causing severe rash involving mucus membranes or skin necrosis: no Has patient had a PCN reaction that required hospitalization: no Has patient had a PCN reaction occurring within the last 10 years: no If all of the above answers are "NO", then may proceed with Cephalosporin use.    Review of Systems: Negative except as noted in the HPI.  Objective:  Mark Stewart is a pleasant 78 y.o. male in NAD. AAO x 3.  Vascular Examination: Capillary refill time is 3-5 seconds to toes bilateral. Palpable pedal pulses b/l LE. Digital hair present b/l.  Skin temperature gradient WNL b/l. No varicosities b/l. No cyanosis noted b/l.   Dermatological Examination: Pedal skin with normal turgor,  texture and tone b/l. No open wounds. No interdigital macerations b/l. Toenails x10 are 3mm thick, discolored, dystrophic with subungual debris. There is pain with compression of the nail plates.  They are elongated x10     Latest Ref Rng & Units 04/25/2023    9:28 AM  Hemoglobin A1C  Hemoglobin-A1c 4.8 - 5.6 % 8.8    Assessment/Plan: 1. Pain due to onychomycosis of toenails of both feet    The mycotic toenails were sharply debrided x10 with sterile nail nippers and a power debriding burr to decrease bulk/thickness and length.    Return in about 3 months (around 03/21/2024) for [facility].   Clerance Lav, DPM, FACFAS [facility]     [address] [place], [place] [address]                Office [phone]  Fax [phone]

## 2023-12-24 ENCOUNTER — Ambulatory Visit: Admitting: Physician Assistant

## 2023-12-24 ENCOUNTER — Encounter: Payer: Self-pay | Admitting: Neurology

## 2023-12-24 ENCOUNTER — Ambulatory Visit: Payer: PPO | Admitting: Neurology

## 2023-12-24 VITALS — BP 144/72 | HR 68 | Ht 72.0 in | Wt 218.0 lb

## 2023-12-24 DIAGNOSIS — G4733 Obstructive sleep apnea (adult) (pediatric): Secondary | ICD-10-CM | POA: Diagnosis not present

## 2023-12-24 NOTE — Patient Instructions (Signed)

## 2023-12-24 NOTE — Progress Notes (Signed)
 Subjective:    Patient ID: Mark Stewart is a 78 y.o. male.  HPI    Interim history:   Ms. Mark Stewart is a 78 year old right-handed gentleman with an underlying medical history of hypertension, hyperlipidemia, status post splenectomy as a teenager, stroke in 2017, diabetes, cataracts, allergies, and overweight state, who presents for follow-up consultation of his obstructive sleep apnea. The patient is accompanied by his wife today.  He was last seen in this clinic by Ihor Austin, NP on 08/26/2023, at which time he was not using his CPAP any longer.  He was agreeable to restarting treatment.    Today, 12/24/2023: I reviewed his compliance data from 11/23/2023 through 12/22/2023 which is a total of 30 days, during which time he used his CPAP every night with percent use days greater than 4 hours at 100%, indicating superb compliance with an average usage of 6 hours and 19 minutes, residual AHI at goal at 2.7/h, leak on the higher side with the 95th percentile at 26.9 L/min on a pressure of 9 cm with EPR of 3.  He reports doing overall well with his CPAP, he is compliant with treatment.  He uses a fullface mask.  He is motivated to continue with treatment.  He does not like using CPAP but is agreeable to maintaining treatment at this time.  He does nap during the day, Epworth sleepiness score is 20 out of 24.  He does not keep a very set schedule at night, may go to bed somewhere between 2 and 3 AM and rise time maybe around 8 AM.  He has nocturia about once per average night.  He drinks caffeine in the form of soda, diet soda typically and unsweet tea.  He may not hydrate well enough with water or electrolyte water. Of note, he had a CPAP titration study on 05/28/2022 with good apnea resolution with 9 cm of CPAP.    Previously:   08/26/2023 Ihor Austin, NP): << Mark Stewart is a 78 y.o. male with PMH of hx of stroke in 2017, HTN, HLD, DM and longstanding history of sleep apnea managed on CPAP  therapy.  Prior HST 2021 which showed severe OSA with total AHI of 51.7/h and O2 nadir of 86%.  Difficulty tolerating CPAP therefore stopped to use, discussed other treatment options with Dr. Frances Furbish including Earnest Bailey and recommended giving CPAP another chance.  Dr. Frances Furbish recommended proceeding with titration study, completed on 05/2022 which showed resolution of OSA under CPAP therapy and placed on pressure setting of 9.    At prior visit, noted compliance and optimal residual AHI.  Noted tolerating CPAP well with Evora fullface mask, noted continued sleepiness during the day.  ESS 20/24.   Interval history: Accompanied by his wife today. Patient has not used CPAP machine since mid July as he did not feel any benefit with use.  CPAP compliance report noted below prior to discontinuing. Also notes difficulty sleeping well with it, had difficulty keeping mask on and being comfortable with it. Per wife, he lacks motivation in general to use CPAP and does not keep up with routine maintenance such as cleaning. Long discussion regarding risks of untreated sleep apnea especially with prior stroke history. He is willing to restart CPAP based on increased risk of untreated apnea.  >>  09/04/2022 (SA): I reviewed his CPAP compliance data from 07/15/2022 through 08/08/2022, which is a total of 25 days, during which time he used his machine every night with percent use days greater  than 4 hours at 88%, indicating very good compliance, average usage of 6 hours and 18 minutes, residual AHI at goal at 2.6/h, leak acceptable with the 95th percentile at 10.4 L/min on a pressure of 9 cm with EPR of 3.  In the past 30 days he used his machine 25 out of 30 days with percent use days greater than 4 hours at 73%.  He reports having adjusted well to CPAP therapy.  He is now using the Evora fullface mask from [employer] and the seal is better.  He is motivated to continue with treatment, still has sleepiness during the day.  He does  not drive very much, did not drive today.  He is advised not to drive with feeling this sleepy.  His Epworth sleepiness score is 24 out of 24.  He has caffeine in the form of soda and tea, about 4 servings per day, very little water, not quite 16 ounces per day, may be even less than 8 ounces per day per wife's estimate.   I saw him on 04/26/2022 , at which time he reported that his AutoPap mask was uncomfortable.  He had stopped using his AutoPap machine about 3 months prior.  He was advised to proceed with a formal titration study.  He had a CPAP titration study on 05/28/2022.  His sleep efficiency was 72.1%, sleep latency 8.5 minutes, REM latency 64 minutes.  He had a mildly reduced percentage of REM sleep at 14.3%.  He was fitted with a small fullface mask from [employer].  CPAP was titrated from 5 cm to a final pressure of 9 cm at which time his AHI was 0/h with nonsupine REM sleep achieved, O2 nadir was 92%.  Based on his test results I prescribed home CPAP therapy at a pressure of 9 cm.    He saw Ihor Austin, NP on 03/21/2022, at which time he was not compliant with his AutoPap.  He reported having dizziness from it.   He saw Ihor Austin on 07/04/2021, at which time he was compliant with his AutoPap machine.   He saw Ihor Austin, NP on 12/26/2020, at which time he was compliant with his AutoPap and reported improvement in his daytime somnolence.   He saw Dr. Pearlean Brownie on 09/06/2020, at which time medical management for right carotid artery stenosis was recommended.   I saw him on 07/25/2020, at which time we talked about secondary stroke prevention.  He was advised to follow-up with the stroke specialist on a regular basis.  He was advised to proceed with reevaluation of his obstructive sleep apnea.  He had an interim home sleep test on 08/17/2020 which indicated severe obstructive sleep apnea with an AHI of 51.7/h, O2 nadir 86%.   07/25/20: (He) presents for Evaluation of his brain  atherosclerotic disease and history of stroke.  The patient is accompanied by his wife today and is referred by Dr. Conchita Paris.  Reviewed his office note from 06/16/2020.  He has had intermittent dizziness.  He has not had any recent falls but did fall backwards in the garage when he turned.  He did not hurt himself thankfully.  This was a few months ago.  His wife reports that sometimes he is not accurate with his medication intake as far as timing goes but he reports that he takes all his medications on a regular basis with the rare exception of forgetting medication.  He has not had any sudden onset of one-sided weakness or numbness or tingling or  droopy face or slurring of speech recently.  He was supposed to see a vascular specialist but was told that he did not need any carotid artery surgery as I understand.    At the time of his appointment with Dr. Conchita Paris on 06/16/2020 his blood pressure was notably high, 197/105 and he was advised to go to the emergency room after the appointment.  His wife reports that they did go to the emergency room and were there for several hours.  I reviewed the emergency room records from 06/16/2020.  His blood pressure in the ER was 159/123.  He was not given any new medications in the ER.  He was advised to follow-up with his primary care physician for blood pressure management.   Evaluation was requested by Dr. Conchita Paris with stroke specialist, Dr. Meryl Dare.  The patient was under the impression that he would meet with the stroke specialist today.  He has not been seen in stroke clinic since Darrol Angel, nurse practitioner retired.   I have followed him for sleep apnea.  He was previously followed by the stroke team, particularly Darrol Angel, nurse practitioner and Dr. Roda Shutters.    I last saw him on 08/10/2019 for reevaluation of his sleep apnea.  He reported feeling sleepy during the day.  He had not used his CPAP.   He has not used his machine for nearly 2 years.  He was  willing to proceed with a sleep study but wanted to wait after the first of the year.      He had a CT angiogram on 05/26/2020 of the head and neck and was found to have abnormal appearance of the right ICA with severe stenosis of the right carotid siphon, 70% or greater.  Mild atherosclerotic disease of the left carotid siphon without significant stenosis.  Atherosclerotic disease at both carotid bifurcations but without stenosis, aortic atherosclerosis.  30% stenosis of the proximal left subclavian artery.  50% stenosis of the left vertebral artery origin.     I saw him on 05/29/2018, at which time he was compliant with CPAP but had trouble tolerating it.       I first met him at the request of Dr. Roda Shutters and Darrol Angel, NP, on 12/05/2017 at which time he reported snoring and daytime somnolence. His Epworth sleepiness score was 23 at the time. He was advised to proceed with sleep study testing. He had a baseline sleep study, followed by a CPAP titration study. I went over his test results with him in detail today. His baseline sleep study from 01/15/2018 showed a sleep efficiency of 74.1%, sleep latency 92 minutes, REM latency 87.5 minutes. He had an increased percentage of stage II sleep, REM sleep was mildly reduced at 16.9%, total AHI was 11.1 per hour, REM AHI 9.2 per hour, supine sleep was not achieved. Average oxygen saturation was 94%, nadir was 86%. Based on his medical history and sleep related complaints he was advised to pursue CPAP therapy. He had a titration study on 02/26/2018. Sleep efficiency was 80.2%, sleep latency 11 minutes, REM latency 67.5 minutes. He was titrated via full facemask from CPAP of 5 cm to 15 cm. On the final pressure his AHI was 0 per hour with supine REM sleep achieved an O2 nadir of 91%. He had mild PLMS with minimal arousals during that study. Based on his test results I prescribed CPAP therapy for home use at a pressure of 15 cm.   I reviewed his CPAP compliance  data from 04/28/2018 through 05/27/2018 which is a total of 30 days, during which time he used his CPAP every night with percent used days greater than 4 hours at 70%, indicating adequate compliance with an average usage of 5 hours and 21 minutes, residual AHI at goal at 2.1 per hour, leak on the high side with the 95th percentile at 38.8 L/m on a pressure of 15 cm with EPR of 3.    12/05/2017: (He) reports snoring and excessive daytime somnolence. I reviewed your office note from 10/25/2017. His Epworth sleepiness score is 23 out of 24 today, fatigue score is 41 out of 63. He quit smoking, he does not utilize alcohol currently, drinks caffeine in the form of coffee, typically 1 cup per day and 3 cups of tea per day on average. He does not have that scheduled for his bedtime and rise time. Per wife, he tends to sit in the recliner in the dependent watch TV but he dozes off frequently. He may not come to bed until 1 or 2 AM and sometimes as late as 3 AM, his rise time is around 7 but per wife, he may be in bed until 10 unless she makes him get out of bed. He does not have a formal family history of sleep apnea but suspects that his father could've had it. He had a tonsillectomy as a child. He quit smoking over 30 years ago. He has 20 more is and 4 grandchildren, is retired as an Airline pilot. His wife also recently retired. His weight has been stable. He has nocturia about once or twice per night, denies morning headaches. He denies telltale symptoms of restless leg syndrome. Sometimes he mumbles in his sleep and says a few words per wife's report.  His Past Medical History Is Significant For: Past Medical History:  Diagnosis Date   Allergy    Cancer (HCC)    skin, hand   Cataract    history of surgery bilaterally   Hypercholesteremia    Hypertension    ICAO (internal carotid artery occlusion)    Spleen absent    patient states had spleen removed in high school   Stroke [facility]) 02/2016    His Past  Surgical History Is Significant For: Past Surgical History:  Procedure Laterality Date   EYE SURGERY     HERNIA REPAIR  02/28/2018   HERNIA REPAIR Bilateral 11/2019   PROSTATE SURGERY     SPLENECTOMY      His Family History Is Significant For: Family History  Problem Relation Age of Onset   Breast cancer Mother    Diabetes Mellitus II Father    Breast cancer Sister    Sleep apnea Neg Hx     His Social History Is Significant For: Social History   Socioeconomic History   Marital status: Married    Spouse name: Mark Stewart   Number of children: Not on file   Years of education: Not on file   Highest education level: Not on file  Occupational History   Not on file  Tobacco Use   Smoking status: Former    Current packs/day: 1.00    Average packs/day: 1 pack/day for 20.0 years (20.0 ttl pk-yrs)    Types: Cigarettes   Smokeless tobacco: Never   Tobacco comments:    patient states last cigarette 35 years ago  Substance and Sexual Activity   Alcohol use: No   Drug use: No   Sexual activity: Not Currently  Other Topics Concern  Not on file  Social History Narrative   Lives with wife   Right Handed   Drinks 4-5 cups caffeine daily   Social Drivers of Health   Financial Resource Strain: Low Risk  (06/18/2023)   Overall Financial Resource Strain (CARDIA)    Difficulty of Paying Living Expenses: Not hard at all  Food Insecurity: Low Risk  (10/14/2023)   Received from [facility]   Hunger Vital Sign    Worried About Running Out of Food in the Last Year: Never true    Ran Out of Food in the Last Year: Never true  Transportation Needs: No Transportation Needs (10/14/2023)   Received from [facility]    In the past 12 months, has lack of reliable transportation kept you from medical appointments, meetings, work or from getting things needed for daily living? : No  Physical Activity: Inactive (06/18/2023)   Exercise Vital Sign    Days of Exercise per Week: 0  days    Minutes of Exercise per Session: 0 min  Stress: No Stress Concern Present (06/18/2023)   [employer] of Occupational Health - Occupational Stress Questionnaire    Feeling of Stress : Not at all  Social Connections: Socially Integrated (06/18/2023)   Social Connection and Isolation Panel [NHANES]    Frequency of Communication with Friends and Family: More than three times a week    Frequency of Social Gatherings with Friends and Family: More than three times a week    Attends Religious Services: More than 4 times per year    Active Member of [employer] or Organizations: Yes    Attends Engineer, structural: More than 4 times per year    Marital Status: Married    His Allergies Are:  Allergies  Allergen Reactions   Bactrim [Sulfamethoxazole-Trimethoprim] Hives   Benazepril Cough   Metformin And Related Rash   Penicillins Rash    Has patient had a PCN reaction causing immediate rash, facial/tongue/throat swelling, SOB or lightheadedness with hypotension: yes Has patient had a PCN reaction causing severe rash involving mucus membranes or skin necrosis: no Has patient had a PCN reaction that required hospitalization: no Has patient had a PCN reaction occurring within the last 10 years: no If all of the above answers are "NO", then may proceed with Cephalosporin use.   :   His Current Medications Are:  Outpatient Encounter Medications as of 12/24/2023  Medication Sig   amLODipine (NORVASC) 10 MG tablet TAKE 1 TABLET BY MOUTH EVERY DAY   aspirin EC 81 MG tablet Take 81 mg by mouth daily.   atorvastatin (LIPITOR) 80 MG tablet Take 1 tablet (80 mg total) by mouth daily.   buPROPion (WELLBUTRIN XL) 150 MG 24 hr tablet Take 150 mg by mouth daily.   carvedilol (COREG) 25 MG tablet TAKE 1 TABLET (25 MG TOTAL) BY MOUTH TWICE A DAY WITH MEALS   Cholecalciferol (VITAMIN D3 PO) Take by mouth daily.   cloNIDine (CATAPRES - DOSED IN MG/24 HR) 0.2 mg/24hr patch APPLY 1 PATCH TO SKIN  WEEKLY   empagliflozin (JARDIANCE) 25 MG TABS tablet Take 25 mg by mouth daily.    fluticasone (FLONASE) 50 MCG/ACT nasal spray PLACE 2 SPRAYS INTO BOTH NOSTRILS DAILY AS NEEDED FOR ALLERGIES OR RHINITIS. REPORTED ON 04/26/2016   Insulin Disposable Pump (OMNIPOD DASH 5 PACK PODS) MISC    minoxidil (LONITEN) 10 MG tablet TAKE 1/2 TABLET BY MOUTH EVERY DAY   montelukast (SINGULAIR) 10 MG tablet TAKE  1 TABLET BY MOUTH EVERYDAY AT BEDTIME   NOVOLOG 100 UNIT/ML injection USE AS DIRECTED IN INSULIN PUMP. MDD: 100UNITS/DAY   omeprazole (PRILOSEC) 40 MG capsule TAKE 1 CAPSULE (40 MG TOTAL) BY MOUTH DAILY.   predniSONE (DELTASONE) 10 MG tablet 1 po tid for 3 days then 1 po bid for 3 days then 1 po qd for 3 days   sildenafil (VIAGRA) 100 MG tablet Take 100 mg by mouth as needed.   spironolactone (ALDACTONE) 50 MG tablet Take 1 tablet (50 mg total) by mouth daily.   tamsulosin (FLOMAX) 0.4 MG CAPS capsule Take 0.4 mg by mouth daily after supper. Take 1 tablet 1/2 hour after supper   valACYclovir (VALTREX) 1000 MG tablet Take 1,000 mg by mouth 3 (three) times daily.   valsartan (DIOVAN) 320 MG tablet Take 1 tablet (320 mg total) by mouth daily.   B-D UF III MINI PEN NEEDLES 31G X 5 MM MISC  (Patient not taking: Reported on 12/24/2023)   No facility-administered encounter medications on file as of 12/24/2023.  :  Review of Systems:  Out of a complete 14 point review of systems, all are reviewed and negative with the exception of these symptoms as listed below:  Review of Systems  Neurological:        Patient in room #5 with his wife.  Patient states he been doing well and sleep great with his CPAP but doesn't always love it.  ESS-20 , FSS-32    Objective:  Neurological Exam  Physical Exam Physical Examination:   Vitals:   12/24/23 1048  BP: (!) 144/72  Pulse: 68    General Examination: The patient is a very pleasant 78 y.o. male in no acute distress. He appears well-developed and  well-nourished and well groomed.   HEENT: Normocephalic, atraumatic, pupils are equal, round and reactive to light, corrective eyeglasses in place.  Face is symmetric without facial weakness, no dysarthria, no hypophonia, no voice tremor.  Airway examination reveals stable findings, mild to moderate mouth dryness noted.  Tongue protrudes centrally and palate elevates symmetrically.  No carotid bruits.  Hearing grossly intact, hearing aids in place.   Chest: Clear to auscultation without wheezing, rhonchi or crackles noted.   Heart: S1+S2+0, regular and normal without murmurs, rubs or gallops noted.    Abdomen: Soft, non-tender and non-distended.   Extremities: There is no obvious edema in the distal lower extremities bilaterally.     Skin: Warm and dry without trophic changes noted.   Musculoskeletal: exam reveals no obvious joint deformities.    Neurologically:  Mental status: The patient is awake, alert and oriented in all 4 spheres. His immediate and remote memory, attention, language skills and fund of knowledge are fairly appropriate.  History is supplemented by his wife.  Thought process is linear. Mood is normal and affect is normal.  Cranial nerves II - XII are as described above under HEENT exam.  Motor exam: Normal bulk, strength and tone is noted. There is no obvious tremor. Fine motor skills and coordination: grossly intact.  Cerebellar testing: No dysmetria or intention tremor. There is no truncal or gait ataxia.   Sensory exam: intact to light touch in the upper and lower extremities.  Gait, station and balance: He stands easily. No veering to one side is noted. No leaning to one side is noted. Posture is age-appropriate and stance is narrow based. Gait shows normal stride length and normal pace. No problems turning are noted.  No walking aid.  Assessment and Plan:  In summary, Mark Stewart is a 78 year old right-handed gentleman with an underlying medical history of  hypertension, hyperlipidemia, status post splenectomy as a teenager, stroke in 2017, diabetes, cataracts, allergies, and overweight state, who presents for follow-up consultation of his obstructive sleep apnea, on CPAP therapy of 9 cm.  He had prior difficulty tolerating AutoPap therapy.  He is currently compliant with his CPAP and his apnea control is good.  Leak fluctuates from his fullface mask but is overall tolerable.  He is commended for his treatment adherence.  Of note, he had a CPAP titration study in August 2023. He has residual sleepiness but is advised to use his CPAP 7 to 8 hours on any given night and try to hydrate better with water.  He is advised to follow-up routinely at this point in sleep clinic in 1 year, he can see Ihor Austin, NP next time.  I answered all their questions today and the patient and his wife are in agreement. I spent 30 minutes in total face-to-face time and in reviewing records during pre-charting, more than 50% of which was spent in counseling and coordination of care, reviewing test results, reviewing medications and treatment regimen and/or in discussing or reviewing the diagnosis of OSA, the prognosis and treatment options. Pertinent laboratory and imaging test results that were available during this visit with the patient were reviewed by me and considered in my medical decision making (see chart for details).

## 2023-12-27 ENCOUNTER — Ambulatory Visit: Payer: PPO | Admitting: Physician Assistant

## 2023-12-27 ENCOUNTER — Other Ambulatory Visit

## 2023-12-27 DIAGNOSIS — E782 Mixed hyperlipidemia: Secondary | ICD-10-CM

## 2023-12-27 DIAGNOSIS — Z794 Long term (current) use of insulin: Secondary | ICD-10-CM | POA: Diagnosis not present

## 2023-12-27 DIAGNOSIS — I10 Essential (primary) hypertension: Secondary | ICD-10-CM

## 2023-12-27 DIAGNOSIS — E559 Vitamin D deficiency, unspecified: Secondary | ICD-10-CM

## 2023-12-27 DIAGNOSIS — N400 Enlarged prostate without lower urinary tract symptoms: Secondary | ICD-10-CM | POA: Diagnosis not present

## 2023-12-27 DIAGNOSIS — E1165 Type 2 diabetes mellitus with hyperglycemia: Secondary | ICD-10-CM | POA: Diagnosis not present

## 2023-12-28 LAB — TSH: TSH: 2.74 u[IU]/mL (ref 0.450–4.500)

## 2023-12-28 LAB — CBC WITH DIFFERENTIAL/PLATELET
Basophils Absolute: 0 10*3/uL (ref 0.0–0.2)
Basos: 1 %
EOS (ABSOLUTE): 0.6 10*3/uL — ABNORMAL HIGH (ref 0.0–0.4)
Eos: 8 %
Hematocrit: 46.6 % (ref 37.5–51.0)
Hemoglobin: 15.2 g/dL (ref 13.0–17.7)
Immature Grans (Abs): 0 10*3/uL (ref 0.0–0.1)
Immature Granulocytes: 0 %
Lymphocytes Absolute: 1.6 10*3/uL (ref 0.7–3.1)
Lymphs: 21 %
MCH: 31.3 pg (ref 26.6–33.0)
MCHC: 32.6 g/dL (ref 31.5–35.7)
MCV: 96 fL (ref 79–97)
Monocytes Absolute: 0.9 10*3/uL (ref 0.1–0.9)
Monocytes: 12 %
Neutrophils Absolute: 4.5 10*3/uL (ref 1.4–7.0)
Neutrophils: 58 %
Platelets: 336 10*3/uL (ref 150–450)
RBC: 4.86 x10E6/uL (ref 4.14–5.80)
RDW: 13 % (ref 11.6–15.4)
WBC: 7.7 10*3/uL (ref 3.4–10.8)

## 2023-12-28 LAB — LIPID PANEL
Chol/HDL Ratio: 3.6 ratio (ref 0.0–5.0)
Cholesterol, Total: 123 mg/dL (ref 100–199)
HDL: 34 mg/dL — ABNORMAL LOW (ref >39)
LDL Chol Calc (NIH): 75 mg/dL (ref 0–99)
Triglycerides: 69 mg/dL (ref 0–149)
VLDL Cholesterol Cal: 14 mg/dL (ref 5–40)

## 2023-12-28 LAB — COMPREHENSIVE METABOLIC PANEL
ALT: 19 IU/L (ref 0–44)
AST: 13 IU/L (ref 0–40)
Albumin: 4 g/dL (ref 3.8–4.8)
Alkaline Phosphatase: 118 IU/L (ref 44–121)
BUN/Creatinine Ratio: 17 (ref 10–24)
BUN: 23 mg/dL (ref 8–27)
Bilirubin Total: 0.5 mg/dL (ref 0.0–1.2)
CO2: 23 mmol/L (ref 20–29)
Calcium: 9.2 mg/dL (ref 8.6–10.2)
Chloride: 104 mmol/L (ref 96–106)
Creatinine, Ser: 1.39 mg/dL — ABNORMAL HIGH (ref 0.76–1.27)
Globulin, Total: 2.5 g/dL (ref 1.5–4.5)
Glucose: 255 mg/dL — ABNORMAL HIGH (ref 70–99)
Potassium: 5.1 mmol/L (ref 3.5–5.2)
Sodium: 140 mmol/L (ref 134–144)
Total Protein: 6.5 g/dL (ref 6.0–8.5)
eGFR: 52 mL/min/{1.73_m2} — ABNORMAL LOW (ref >59)

## 2023-12-28 LAB — VITAMIN D 25 HYDROXY (VIT D DEFICIENCY, FRACTURES): Vit D, 25-Hydroxy: 27.2 ng/mL — ABNORMAL LOW (ref 30.0–100.0)

## 2023-12-28 LAB — HEMOGLOBIN A1C
Est. average glucose Bld gHb Est-mCnc: 217 mg/dL
Hgb A1c MFr Bld: 9.2 % — ABNORMAL HIGH (ref 4.8–5.6)

## 2023-12-28 LAB — PSA: Prostate Specific Ag, Serum: 3.3 ng/mL (ref 0.0–4.0)

## 2023-12-31 ENCOUNTER — Ambulatory Visit (INDEPENDENT_AMBULATORY_CARE_PROVIDER_SITE_OTHER): Admitting: Physician Assistant

## 2023-12-31 ENCOUNTER — Encounter: Payer: Self-pay | Admitting: Physician Assistant

## 2023-12-31 VITALS — BP 108/68 | HR 72 | Temp 98.2°F | Resp 18 | Ht 72.0 in | Wt 214.8 lb

## 2023-12-31 DIAGNOSIS — F331 Major depressive disorder, recurrent, moderate: Secondary | ICD-10-CM | POA: Diagnosis not present

## 2023-12-31 DIAGNOSIS — Z794 Long term (current) use of insulin: Secondary | ICD-10-CM | POA: Diagnosis not present

## 2023-12-31 DIAGNOSIS — I152 Hypertension secondary to endocrine disorders: Secondary | ICD-10-CM

## 2023-12-31 DIAGNOSIS — E1159 Type 2 diabetes mellitus with other circulatory complications: Secondary | ICD-10-CM | POA: Diagnosis not present

## 2023-12-31 DIAGNOSIS — E785 Hyperlipidemia, unspecified: Secondary | ICD-10-CM | POA: Diagnosis not present

## 2023-12-31 DIAGNOSIS — E1165 Type 2 diabetes mellitus with hyperglycemia: Secondary | ICD-10-CM

## 2023-12-31 DIAGNOSIS — N400 Enlarged prostate without lower urinary tract symptoms: Secondary | ICD-10-CM | POA: Diagnosis not present

## 2023-12-31 DIAGNOSIS — E1169 Type 2 diabetes mellitus with other specified complication: Secondary | ICD-10-CM

## 2023-12-31 DIAGNOSIS — E559 Vitamin D deficiency, unspecified: Secondary | ICD-10-CM

## 2023-12-31 MED ORDER — VITAMIN D (ERGOCALCIFEROL) 1.25 MG (50000 UNIT) PO CAPS: 50000.0000 [IU] | ORAL_CAPSULE | ORAL | 5 refills | Status: DC

## 2023-12-31 NOTE — Progress Notes (Signed)
 Subjective:  Patient ID: Mark Stewart, male    DOB: [birth year]  Age: 78 y.o. MRN: [id number]  Chief Complaint  Patient presents with   Medical Management of Chronic Issues    HPI   Pt presents for follow up of hypertension. Patient has had hypertension for more than 20 years.  He states that this is managed by cardiology.  He is on multiple medications including norvasc 10, coreg 25, clonidine patch, hctz 25mg , minoxidil 10, aldactone 50 and diovan 320 Denies chest pain or dyspnea He was last seen by his cardiologist  Dr Royann Shivers about 2 weeks ago  Pt with longstanding history of almost 30 years of diabetes.  He follows with Dr Allena Katz endocrinologist regularly. His next appt is later this week - pt had labwork done earlier this week and his hgb A1c has elevated to 9.2 - he will discuss changing treatment to get better control  -- He is currently on Novolog through [facility] and Jardiance 25mg  He states his glucose readings have been more elevated recently He is up to date on eye exam   Pt with GERD - stable on prilosec 40mg  qd  Pt with mild  depression and currently on wellbutrin daily - states med is working well for him  Pt with history of hematuria - he follows with Dr Saddie Benders regularly and last appt was a few weeks ago Recent labwork showed kidney functions and PSA normal Pt takes flomax qd  Pt with obstructive sleep apnea and does use CPAP nightly - good compliance  Pt with vit D def - he is taking a daily supplement but recent labs did show low level - will start weekly rx instead of daily otc supplement    Current Outpatient Medications on File Prior to Visit  Medication Sig Dispense Refill   amLODipine (NORVASC) 10 MG tablet TAKE 1 TABLET BY MOUTH EVERY DAY 90 tablet 0   aspirin EC 81 MG tablet Take 81 mg by mouth daily.     atorvastatin (LIPITOR) 80 MG tablet Take 1 tablet (80 mg total) by mouth daily. 90 tablet 1   B-D UF III MINI PEN NEEDLES 31G X 5 MM MISC       buPROPion (WELLBUTRIN XL) 150 MG 24 hr tablet Take 150 mg by mouth daily.     carvedilol (COREG) 25 MG tablet TAKE 1 TABLET (25 MG TOTAL) BY MOUTH TWICE A DAY WITH MEALS 180 tablet 0   Cholecalciferol (VITAMIN D3 PO) Take by mouth daily.     cloNIDine (CATAPRES - DOSED IN MG/24 HR) 0.2 mg/24hr patch APPLY 1 PATCH TO SKIN WEEKLY 12 patch 0   empagliflozin (JARDIANCE) 25 MG TABS tablet Take 25 mg by mouth daily.      fluticasone (FLONASE) 50 MCG/ACT nasal spray PLACE 2 SPRAYS INTO BOTH NOSTRILS DAILY AS NEEDED FOR ALLERGIES OR RHINITIS. REPORTED ON 04/26/2016 48 mL 1   Insulin Disposable Pump (OMNIPOD DASH 5 PACK PODS) MISC      minoxidil (LONITEN) 10 MG tablet TAKE 1/2 TABLET BY MOUTH EVERY DAY 45 tablet 2   montelukast (SINGULAIR) 10 MG tablet TAKE 1 TABLET BY MOUTH EVERYDAY AT BEDTIME 90 tablet 1   NOVOLOG 100 UNIT/ML injection USE AS DIRECTED IN INSULIN PUMP. MDD: 100UNITS/DAY     omeprazole (PRILOSEC) 40 MG capsule TAKE 1 CAPSULE (40 MG TOTAL) BY MOUTH DAILY. 90 capsule 0   predniSONE (DELTASONE) 10 MG tablet 1 po tid for 3 days then 1 po bid for 3  days then 1 po qd for 3 days 18 tablet 0   sildenafil (VIAGRA) 100 MG tablet Take 100 mg by mouth as needed.     spironolactone (ALDACTONE) 50 MG tablet Take 1 tablet (50 mg total) by mouth daily. 90 tablet 3   tamsulosin (FLOMAX) 0.4 MG CAPS capsule Take 0.4 mg by mouth daily after supper. Take 1 tablet 1/2 hour after supper     valACYclovir (VALTREX) 1000 MG tablet Take 1,000 mg by mouth 3 (three) times daily.     valsartan (DIOVAN) 320 MG tablet Take 1 tablet (320 mg total) by mouth daily. 90 tablet 2   No current facility-administered medications on file prior to visit.   Past Medical History:  Diagnosis Date   Allergy    Cancer (HCC)    skin, hand   Cataract    history of surgery bilaterally   Hypercholesteremia    Hypertension    ICAO (internal carotid artery occlusion)    Spleen absent    patient states had spleen removed in high  school   Stroke [facility]) 02/2016   Past Surgical History:  Procedure Laterality Date   EYE SURGERY     HERNIA REPAIR  02/28/2018   HERNIA REPAIR Bilateral 11/2019   PROSTATE SURGERY     SPLENECTOMY      Family History  Problem Relation Age of Onset   Breast cancer Mother    Diabetes Mellitus II Father    Breast cancer Sister    Sleep apnea Neg Hx    Social History   Socioeconomic History   Marital status: Married    Spouse name: graye   Number of children: Not on file   Years of education: Not on file   Highest education level: Not on file  Occupational History   Not on file  Tobacco Use   Smoking status: Former    Current packs/day: 1.00    Average packs/day: 1 pack/day for 20.0 years (20.0 ttl pk-yrs)    Types: Cigarettes   Smokeless tobacco: Never   Tobacco comments:    patient states last cigarette 35 years ago  Substance and Sexual Activity   Alcohol use: No   Drug use: No   Sexual activity: Not Currently  Other Topics Concern   Not on file  Social History Narrative   Lives with wife   Right Handed   Drinks 4-5 cups caffeine daily   Social Drivers of Health   Financial Resource Strain: Low Risk  (06/18/2023)   Overall Financial Resource Strain (CARDIA)    Difficulty of Paying Living Expenses: Not hard at all  Food Insecurity: Low Risk  (10/14/2023)   Received from [facility]   Hunger Vital Sign    Worried About Running Out of Food in the Last Year: Never true    Ran Out of Food in the Last Year: Never true  Transportation Needs: No Transportation Needs (10/14/2023)   Received from [facility]    In the past 12 months, has lack of reliable transportation kept you from medical appointments, meetings, work or from getting things needed for daily living? : No  Physical Activity: Inactive (06/18/2023)   Exercise Vital Sign    Days of Exercise per Week: 0 days    Minutes of Exercise per Session: 0 min  Stress: No Stress Concern Present  (06/18/2023)   [employer] of Occupational Health - Occupational Stress Questionnaire    Feeling of Stress : Not at all  Social Connections: Socially Integrated (06/18/2023)   Social Connection and Isolation Panel [NHANES]    Frequency of Communication with Friends and Family: More than three times a week    Frequency of Social Gatherings with Friends and Family: More than three times a week    Attends Religious Services: More than 4 times per year    Active Member of Clubs or Organizations: Yes    Attends Engineer, structural: More than 4 times per year    Marital Status: Married  CONSTITUTIONAL: Negative for chills, fatigue, fever, unintentional weight gain and unintentional weight loss.  E/N/T: Negative for ear pain, nasal congestion and sore throat.  CARDIOVASCULAR: Negative for chest pain, dizziness, palpitations and pedal edema.  RESPIRATORY: Negative for recent cough and dyspnea.  GASTROINTESTINAL: Negative for abdominal pain, acid reflux symptoms, constipation, diarrhea, nausea and vomiting.  MSK: Negative for arthralgias and myalgias.  INTEGUMENTARY: Negative for rash.  NEUROLOGICAL: Negative for dizziness and headaches.  PSYCHIATRIC: Negative for sleep disturbance and to question depression screen.  Negative for depression, negative for anhedonia.       Objective:  PHYSICAL EXAM:   VS: BP 108/68   Pulse 72   Temp 98.2 F (36.8 C) (Temporal)   Resp 18   Ht 6' (1.829 m)   Wt 214 lb 12.8 oz (97.4 kg)   SpO2 97%   BMI 29.13 kg/m   GEN: Well nourished, well developed, in no acute distress   Cardiac: RRR; no murmurs, rubs, or gallops,no edema -  Respiratory:  normal respiratory rate and pattern with no distress - normal breath sounds with no rales, rhonchi, wheezes or rubs  MS: no deformity or atrophy  Skin: warm and dry, no rash  Neuro:  Alert and Oriented x 3,- CN II-Xii grossly intact Psych: euthymic mood, appropriate affect and demeanor  Diabetic  Foot Exam - Simple   No data filed     Lab Results  Component Value Date   WBC 7.7 12/27/2023   HGB 15.2 12/27/2023   HCT 46.6 12/27/2023   PLT 336 12/27/2023   GLUCOSE 255 (H) 12/27/2023   CHOL 123 12/27/2023   TRIG 69 12/27/2023   HDL 34 (L) 12/27/2023   LDLCALC 75 12/27/2023   ALT 19 12/27/2023   AST 13 12/27/2023   NA 140 12/27/2023   K 5.1 12/27/2023   CL 104 12/27/2023   CREATININE 1.39 (H) 12/27/2023   BUN 23 12/27/2023   CO2 23 12/27/2023   TSH 2.740 12/27/2023   HGBA1C 9.2 (H) 12/27/2023   MICROALBUR 150 03/21/2021      Assessment & Plan:   Problem List Items Addressed This Visit       Cardiovascular and Mediastinum   Hypertension associated with diabetes (HCC)   Relevant Orders   CBC with Differential/Platelet   Comprehensive metabolic panel   Lipid panel Continue current meds  Follow up with cardiology as directed      Endocrine   Diabetes mellitus with hyperglycemia, with long-term current use of insulin (HCC) - Primary   Relevant Orders      CBC with Differential/Platelet   Comprehensive metabolic panel   Lipid panel A1c Urine microalbumin pending Continue meds and follow up with endocrinology this week for follow up and to adjust medication     Other   Hyperlipidemia associated with diabetes (HCC)   Relevant Orders   CBC with Differential/Platelet   Comprehensive metabolic panel   Lipid panel Continue meds      BPH  with history of hematuria Continue flomax Follow up with urology as directed      Vit D Def Rx ergocalciferol weekly supplement    Major depressive disorder, recurrent episode, moderate (HCC) Continue wellbutrin    .  Meds ordered this encounter  Medications   Vitamin D, Ergocalciferol, (DRISDOL) 1.25 MG (50000 UNIT) CAPS capsule    Sig: Take 1 capsule (50,000 Units total) by mouth every 7 (seven) days.    Dispense:  5 capsule    Refill:  5    Supervising Provider:   Blane Ohara [phone]    Orders Placed  This Encounter  Procedures      CBC with Differential/Platelet   Comprehensive metabolic panel   Lipid panel              Follow-up: Return in about 6 months (around 07/02/2024) for chronic fasting follow-up.  An After Visit Summary was printed and given to the patient.  Jettie Pagan [facility] [phone]

## 2024-01-02 LAB — MICROALBUMIN / CREATININE URINE RATIO
Creatinine, Urine: 75.4 mg/dL
Microalb/Creat Ratio: 239 mg/g{creat} — ABNORMAL HIGH (ref 0–29)
Microalbumin, Urine: 180.5 ug/mL

## 2024-01-08 ENCOUNTER — Other Ambulatory Visit: Payer: Self-pay | Admitting: Cardiovascular Disease

## 2024-01-10 DIAGNOSIS — E118 Type 2 diabetes mellitus with unspecified complications: Secondary | ICD-10-CM | POA: Diagnosis not present

## 2024-01-14 ENCOUNTER — Other Ambulatory Visit: Payer: Self-pay | Admitting: Cardiovascular Disease

## 2024-01-14 DIAGNOSIS — Z794 Long term (current) use of insulin: Secondary | ICD-10-CM | POA: Diagnosis not present

## 2024-01-14 DIAGNOSIS — E1142 Type 2 diabetes mellitus with diabetic polyneuropathy: Secondary | ICD-10-CM | POA: Diagnosis not present

## 2024-01-14 DIAGNOSIS — Z9641 Presence of insulin pump (external) (internal): Secondary | ICD-10-CM | POA: Diagnosis not present

## 2024-01-14 DIAGNOSIS — E118 Type 2 diabetes mellitus with unspecified complications: Secondary | ICD-10-CM | POA: Diagnosis not present

## 2024-01-14 NOTE — Telephone Encounter (Signed)
 Dr. Erin Hearing pt. As this is not a cardiac RX, does Dr. Royann Shivers want to refill? Please advise

## 2024-01-14 NOTE — Telephone Encounter (Signed)
 This is a cardiac drug, strongest BP medication we have. Please refill

## 2024-01-17 ENCOUNTER — Other Ambulatory Visit: Payer: Self-pay | Admitting: Physician Assistant

## 2024-01-17 DIAGNOSIS — R053 Chronic cough: Secondary | ICD-10-CM

## 2024-02-10 DIAGNOSIS — E118 Type 2 diabetes mellitus with unspecified complications: Secondary | ICD-10-CM | POA: Diagnosis not present

## 2024-02-11 DIAGNOSIS — D3502 Benign neoplasm of left adrenal gland: Secondary | ICD-10-CM | POA: Diagnosis not present

## 2024-02-11 DIAGNOSIS — R351 Nocturia: Secondary | ICD-10-CM | POA: Diagnosis not present

## 2024-02-11 DIAGNOSIS — N401 Enlarged prostate with lower urinary tract symptoms: Secondary | ICD-10-CM | POA: Diagnosis not present

## 2024-02-24 DIAGNOSIS — G4733 Obstructive sleep apnea (adult) (pediatric): Secondary | ICD-10-CM | POA: Diagnosis not present

## 2024-02-25 ENCOUNTER — Encounter: Payer: Self-pay | Admitting: Physician Assistant

## 2024-02-25 ENCOUNTER — Ambulatory Visit (INDEPENDENT_AMBULATORY_CARE_PROVIDER_SITE_OTHER): Admitting: Physician Assistant

## 2024-02-25 VITALS — BP 148/86 | HR 84 | Temp 97.5°F | Resp 18 | Ht 72.0 in | Wt 209.8 lb

## 2024-02-25 DIAGNOSIS — N62 Hypertrophy of breast: Secondary | ICD-10-CM

## 2024-02-25 DIAGNOSIS — N644 Mastodynia: Secondary | ICD-10-CM | POA: Diagnosis not present

## 2024-02-25 NOTE — Progress Notes (Signed)
 Acute Office Visit  Subjective:    Patient ID: Mark Stewart, male    DOB: January 02, 1946, 78 y.o.   MRN: [id number]  Chief Complaint  Patient presents with   Breast Mass    Left    HPI: Patient is in today for complaints of bilateral breast tenderness and possible left breast mass Pt had noted changes in the past week.  He does have a personal history of gynecomastia and did have a diagnostic mammogram with resulting left breast biopsy on 2021 which was benign He does have family history of breast cancer in both his mother and his sister    Current Outpatient Medications:    amLODipine  (NORVASC ) 10 MG tablet, TAKE 1 TABLET BY MOUTH EVERY DAY, Disp: 90 tablet, Rfl: 0   aspirin  EC 81 MG tablet, Take 81 mg by mouth daily., Disp: , Rfl:    atorvastatin  (LIPITOR) 80 MG tablet, Take 1 tablet (80 mg total) by mouth daily., Disp: 90 tablet, Rfl: 1   B-D UF III MINI PEN NEEDLES 31G X 5 MM MISC, , Disp: , Rfl:    buPROPion (WELLBUTRIN XL) 150 MG 24 hr tablet, Take 150 mg by mouth daily., Disp: , Rfl:    carvedilol  (COREG ) 25 MG tablet, Take 1 tablet (25 mg total) by mouth 2 (two) times daily with a meal., Disp: 180 tablet, Rfl: 2   Cholecalciferol (VITAMIN D3 PO), Take by mouth daily., Disp: , Rfl:    cloNIDine  (CATAPRES  - DOSED IN MG/24 HR) 0.2 mg/24hr patch, APPLY 1 PATCH TO SKIN WEEKLY, Disp: 12 patch, Rfl: 3   empagliflozin (JARDIANCE) 25 MG TABS tablet, Take 25 mg by mouth daily. , Disp: , Rfl:    fluticasone  (FLONASE ) 50 MCG/ACT nasal spray, PLACE 2 SPRAYS INTO BOTH NOSTRILS DAILY AS NEEDED FOR ALLERGIES OR RHINITIS. REPORTED ON 04/26/2016, Disp: 48 mL, Rfl: 1   Insulin  Disposable Pump (OMNIPOD DASH 5 PACK PODS) MISC, , Disp: , Rfl:    minoxidil  (LONITEN ) 10 MG tablet, TAKE 1/2 TABLET BY MOUTH DAILY, Disp: 45 tablet, Rfl: 2   montelukast  (SINGULAIR ) 10 MG tablet, TAKE 1 TABLET BY MOUTH EVERYDAY AT BEDTIME, Disp: 90 tablet, Rfl: 1   NOVOLOG  100 UNIT/ML injection, USE AS DIRECTED IN INSULIN   PUMP. MDD: 100UNITS/DAY, Disp: , Rfl:    omeprazole  (PRILOSEC) 40 MG capsule, TAKE 1 CAPSULE (40 MG TOTAL) BY MOUTH DAILY., Disp: 90 capsule, Rfl: 0   Semaglutide,0.25 or 0.5MG /DOS, (OZEMPIC, 0.25 OR 0.5 MG/DOSE,) 2 MG/3ML SOPN, Inject 0.5 mg into the skin., Disp: , Rfl:    sildenafil (VIAGRA) 100 MG tablet, Take 100 mg by mouth as needed., Disp: , Rfl:    spironolactone  (ALDACTONE ) 50 MG tablet, Take 1 tablet (50 mg total) by mouth daily., Disp: 90 tablet, Rfl: 3   tamsulosin (FLOMAX) 0.4 MG CAPS capsule, Take 0.4 mg by mouth daily after supper. Take 1 tablet 1/2 hour after supper, Disp: , Rfl:    valACYclovir (VALTREX) 1000 MG tablet, Take 1,000 mg by mouth 3 (three) times daily., Disp: , Rfl:    valsartan  (DIOVAN ) 320 MG tablet, Take 1 tablet (320 mg total) by mouth daily., Disp: 90 tablet, Rfl: 2   Vitamin D , Ergocalciferol , (DRISDOL ) 1.25 MG (50000 UNIT) CAPS capsule, Take 1 capsule (50,000 Units total) by mouth every 7 (seven) days., Disp: 5 capsule, Rfl: 5  Allergies  Allergen Reactions   Bactrim  [Sulfamethoxazole -Trimethoprim ] Hives   Benazepril Cough   Metformin And Related Rash   Penicillins Rash  Has patient had a PCN reaction causing immediate rash, facial/tongue/throat swelling, SOB or lightheadedness with hypotension: yes Has patient had a PCN reaction causing severe rash involving mucus membranes or skin necrosis: no Has patient had a PCN reaction that required hospitalization: no Has patient had a PCN reaction occurring within the last 10 years: no If all of the above answers are "NO", then may proceed with Cephalosporin use.     ROS CONSTITUTIONAL: Negative for chills, fatigue, fever, unintentional weight gain and unintentional weight loss.    CARDIOVASCULAR: Negative for chest pain, dizziness,  RESPIRATORY: Negative for recent cough and dyspnea.  Breasts - see HPI INTEGUMENTARY: Negative for rash.       Objective:    PHYSICAL EXAM:   BP (!) 148/86   Pulse 84    Temp (!) 97.5 F (36.4 C) (Temporal)   Resp 18   Ht 6' (1.829 m)   Wt 209 lb 12.8 oz (95.2 kg)   SpO2 97%   BMI 28.45 kg/m    GEN: Well nourished, well developed, in no acute distress  Cardiac: RRR; no murmurs, Respiratory:  normal respiratory rate and pattern with no distress - normal breath sounds with no rales, rhonchi, wheezes or rubs Breasts - bilateral gynecomastia noted - lump noted at 1 oclock position in left breast Skin: warm and dry, no rash      Assessment & Plan:    Gynecomastia -     MM Digital Diagnostic Bilat; Future -     US  LIMITED ULTRASOUND INCLUDING AXILLA LEFT BREAST ; Future -     US  LIMITED ULTRASOUND INCLUDING AXILLA RIGHT BREAST; Future  Painful breasts -     MM Digital Diagnostic Bilat; Future -     US  LIMITED ULTRASOUND INCLUDING AXILLA LEFT BREAST ; Future -     US  LIMITED ULTRASOUND INCLUDING AXILLA RIGHT BREAST; Future     Follow-up: Return if symptoms worsen or fail to improve.  An After Visit Summary was printed and given to the patient.  Anthonette Bastos [facility] [phone]

## 2024-03-09 ENCOUNTER — Telehealth: Payer: Self-pay

## 2024-03-09 NOTE — Telephone Encounter (Signed)
 Copied from CRM [id number]. Topic: General - Other >> Mar 09, 2024  1:05 PM Emylou G wrote: Reason for CRM: patient called.. is concerned that his mammogram appt is too far out - he thinks it's July 2nd?  Does he need to go somewhere else?  He says breasts are hurting more

## 2024-03-10 NOTE — Telephone Encounter (Signed)
 Called [facility] and the first available is 7/2 (Pt is scheduled on this day). I called Pt to see if he was okay with going to [facility], Pt is now scheduled on 6/13 arrriving at 9:30am (address and phone number given to the Pt). Pt verbalized understanding.

## 2024-03-10 NOTE — Telephone Encounter (Signed)
 Patient is aware

## 2024-03-11 DIAGNOSIS — E118 Type 2 diabetes mellitus with unspecified complications: Secondary | ICD-10-CM | POA: Diagnosis not present

## 2024-03-20 ENCOUNTER — Ambulatory Visit
Admission: RE | Admit: 2024-03-20 | Discharge: 2024-03-20 | Disposition: A | Discharge status: Home / Self Care | Source: Ambulatory Visit | Attending: Physician Assistant | Admitting: Physician Assistant

## 2024-03-20 DIAGNOSIS — N644 Mastodynia: Secondary | ICD-10-CM

## 2024-03-20 DIAGNOSIS — N62 Hypertrophy of breast: Secondary | ICD-10-CM

## 2024-03-23 ENCOUNTER — Ambulatory Visit: Payer: Self-pay | Admitting: Physician Assistant

## 2024-03-23 ENCOUNTER — Other Ambulatory Visit: Payer: Self-pay | Admitting: Physician Assistant

## 2024-03-23 DIAGNOSIS — K2100 Gastro-esophageal reflux disease with esophagitis, without bleeding: Secondary | ICD-10-CM

## 2024-03-27 ENCOUNTER — Ambulatory Visit: Admitting: Podiatry

## 2024-03-27 DIAGNOSIS — M79675 Pain in left toe(s): Secondary | ICD-10-CM | POA: Diagnosis not present

## 2024-03-27 DIAGNOSIS — M79674 Pain in right toe(s): Secondary | ICD-10-CM | POA: Diagnosis not present

## 2024-03-27 DIAGNOSIS — B351 Tinea unguium: Secondary | ICD-10-CM

## 2024-03-27 NOTE — Progress Notes (Signed)
       Subjective:  Patient ID: Mark Stewart, male    DOB: July 14, 1946,  MRN: [id number]  Mark Stewart presents to clinic today for:  Chief Complaint  Patient presents with   [facility]    [facility] with out callous. Last A1c was 9 two months ago. Takes ASA.    Patient notes nails are thick, discolored, elongated and painful in shoegear when trying to ambulate.    PCP is Cyndi Drain, PA-C.  Past Medical History:  Diagnosis Date   Allergy    Cancer (HCC)    skin, hand   Cataract    history of surgery bilaterally   Hypercholesteremia    Hypertension    ICAO (internal carotid artery occlusion)    Spleen absent    patient states had spleen removed in high school   Stroke [facility]) 02/2016    Past Surgical History:  Procedure Laterality Date   EYE SURGERY     HERNIA REPAIR  02/28/2018   HERNIA REPAIR Bilateral 11/2019   PROSTATE SURGERY     SPLENECTOMY      Allergies  Allergen Reactions   Bactrim  [Sulfamethoxazole -Trimethoprim ] Hives   Benazepril Cough   Metformin And Related Rash   Penicillins Rash    Has patient had a PCN reaction causing immediate rash, facial/tongue/throat swelling, SOB or lightheadedness with hypotension: yes Has patient had a PCN reaction causing severe rash involving mucus membranes or skin necrosis: no Has patient had a PCN reaction that required hospitalization: no Has patient had a PCN reaction occurring within the last 10 years: no If all of the above answers are NO, then may proceed with Cephalosporin use.    Review of Systems: Negative except as noted in the HPI.  Objective:  Mark Stewart is a pleasant 78 y.o. male in NAD. AAO x 3.  Vascular Examination: Capillary refill time is 3-5 seconds to toes bilateral. Palpable pedal pulses b/l LE. Digital hair present b/l.  Skin temperature gradient WNL b/l. No varicosities b/l. No cyanosis noted b/l.   Dermatological Examination: Pedal skin with normal turgor, texture and tone b/l. No open wounds.  No interdigital macerations b/l. Toenails x10 are 3mm thick, discolored, dystrophic with subungual debris. There is pain with compression of the nail plates.  They are elongated x10     Latest Ref Rng & Units 12/27/2023    8:48 AM 04/25/2023    9:28 AM  Hemoglobin A1C  Hemoglobin-A1c 4.8 - 5.6 % 9.2  8.8    Assessment/Plan: 1. Pain due to onychomycosis of toenails of both feet    The mycotic toenails were sharply debrided x10 with sterile nail nippers and a power debriding burr to decrease bulk/thickness and length.    Return in about 3 months (around 06/27/2024) for [facility].   Joe Murders, DPM, FACFAS [facility]     [address] [place], [place] [address]                Office [phone]  Fax [phone]

## 2024-04-06 DIAGNOSIS — L570 Actinic keratosis: Secondary | ICD-10-CM | POA: Diagnosis not present

## 2024-04-06 DIAGNOSIS — L820 Inflamed seborrheic keratosis: Secondary | ICD-10-CM | POA: Diagnosis not present

## 2024-04-06 DIAGNOSIS — L821 Other seborrheic keratosis: Secondary | ICD-10-CM | POA: Diagnosis not present

## 2024-04-06 DIAGNOSIS — Z85820 Personal history of malignant melanoma of skin: Secondary | ICD-10-CM | POA: Diagnosis not present

## 2024-04-06 DIAGNOSIS — L578 Other skin changes due to chronic exposure to nonionizing radiation: Secondary | ICD-10-CM | POA: Diagnosis not present

## 2024-04-13 DIAGNOSIS — E118 Type 2 diabetes mellitus with unspecified complications: Secondary | ICD-10-CM | POA: Diagnosis not present

## 2024-04-14 DIAGNOSIS — Z9641 Presence of insulin pump (external) (internal): Secondary | ICD-10-CM | POA: Diagnosis not present

## 2024-04-14 DIAGNOSIS — E1142 Type 2 diabetes mellitus with diabetic polyneuropathy: Secondary | ICD-10-CM | POA: Diagnosis not present

## 2024-04-14 DIAGNOSIS — Z7984 Long term (current) use of oral hypoglycemic drugs: Secondary | ICD-10-CM | POA: Diagnosis not present

## 2024-04-14 DIAGNOSIS — E118 Type 2 diabetes mellitus with unspecified complications: Secondary | ICD-10-CM | POA: Diagnosis not present

## 2024-04-14 DIAGNOSIS — Z794 Long term (current) use of insulin: Secondary | ICD-10-CM | POA: Diagnosis not present

## 2024-04-16 ENCOUNTER — Other Ambulatory Visit: Payer: Self-pay | Admitting: Physician Assistant

## 2024-05-27 DIAGNOSIS — G4733 Obstructive sleep apnea (adult) (pediatric): Secondary | ICD-10-CM | POA: Diagnosis not present

## 2024-06-03 DIAGNOSIS — E113393 Type 2 diabetes mellitus with moderate nonproliferative diabetic retinopathy without macular edema, bilateral: Secondary | ICD-10-CM | POA: Diagnosis not present

## 2024-06-03 LAB — HM DIABETES EYE EXAM

## 2024-06-06 ENCOUNTER — Other Ambulatory Visit: Payer: Self-pay | Admitting: Physician Assistant

## 2024-06-06 DIAGNOSIS — J4489 Other specified chronic obstructive pulmonary disease: Secondary | ICD-10-CM

## 2024-06-12 ENCOUNTER — Other Ambulatory Visit: Payer: Self-pay | Admitting: Family Medicine

## 2024-06-18 ENCOUNTER — Ambulatory Visit

## 2024-06-18 VITALS — Ht 72.0 in | Wt 209.0 lb

## 2024-06-18 DIAGNOSIS — Z0000 Encounter for general adult medical examination without abnormal findings: Secondary | ICD-10-CM | POA: Diagnosis not present

## 2024-06-18 DIAGNOSIS — R29818 Other symptoms and signs involving the nervous system: Secondary | ICD-10-CM | POA: Diagnosis not present

## 2024-06-18 DIAGNOSIS — Z794 Long term (current) use of insulin: Secondary | ICD-10-CM | POA: Diagnosis not present

## 2024-06-18 DIAGNOSIS — E7800 Pure hypercholesterolemia, unspecified: Secondary | ICD-10-CM | POA: Diagnosis not present

## 2024-06-18 DIAGNOSIS — Z7902 Long term (current) use of antithrombotics/antiplatelets: Secondary | ICD-10-CM | POA: Diagnosis not present

## 2024-06-18 DIAGNOSIS — Z888 Allergy status to other drugs, medicaments and biological substances status: Secondary | ICD-10-CM | POA: Diagnosis not present

## 2024-06-18 DIAGNOSIS — Z792 Long term (current) use of antibiotics: Secondary | ICD-10-CM | POA: Diagnosis not present

## 2024-06-18 DIAGNOSIS — Z87891 Personal history of nicotine dependence: Secondary | ICD-10-CM | POA: Diagnosis not present

## 2024-06-18 DIAGNOSIS — E11649 Type 2 diabetes mellitus with hypoglycemia without coma: Secondary | ICD-10-CM | POA: Diagnosis not present

## 2024-06-18 DIAGNOSIS — R531 Weakness: Secondary | ICD-10-CM | POA: Diagnosis not present

## 2024-06-18 DIAGNOSIS — G4733 Obstructive sleep apnea (adult) (pediatric): Secondary | ICD-10-CM | POA: Diagnosis not present

## 2024-06-18 DIAGNOSIS — I69954 Hemiplegia and hemiparesis following unspecified cerebrovascular disease affecting left non-dominant side: Secondary | ICD-10-CM | POA: Diagnosis not present

## 2024-06-18 DIAGNOSIS — R4781 Slurred speech: Secondary | ICD-10-CM | POA: Diagnosis not present

## 2024-06-18 DIAGNOSIS — Z7982 Long term (current) use of aspirin: Secondary | ICD-10-CM | POA: Diagnosis not present

## 2024-06-18 DIAGNOSIS — E162 Hypoglycemia, unspecified: Secondary | ICD-10-CM | POA: Diagnosis not present

## 2024-06-18 DIAGNOSIS — Z9641 Presence of insulin pump (external) (internal): Secondary | ICD-10-CM | POA: Diagnosis not present

## 2024-06-18 DIAGNOSIS — Z880 Allergy status to penicillin: Secondary | ICD-10-CM | POA: Diagnosis not present

## 2024-06-18 DIAGNOSIS — K219 Gastro-esophageal reflux disease without esophagitis: Secondary | ICD-10-CM | POA: Diagnosis not present

## 2024-06-18 DIAGNOSIS — Z79899 Other long term (current) drug therapy: Secondary | ICD-10-CM | POA: Diagnosis not present

## 2024-06-18 DIAGNOSIS — I69992 Facial weakness following unspecified cerebrovascular disease: Secondary | ICD-10-CM | POA: Diagnosis not present

## 2024-06-18 DIAGNOSIS — R319 Hematuria, unspecified: Secondary | ICD-10-CM | POA: Diagnosis not present

## 2024-06-18 DIAGNOSIS — M25562 Pain in left knee: Secondary | ICD-10-CM | POA: Diagnosis not present

## 2024-06-18 DIAGNOSIS — I10 Essential (primary) hypertension: Secondary | ICD-10-CM | POA: Diagnosis not present

## 2024-06-18 DIAGNOSIS — I6521 Occlusion and stenosis of right carotid artery: Secondary | ICD-10-CM | POA: Diagnosis not present

## 2024-06-18 NOTE — Progress Notes (Signed)
 Subjective:   Mark Stewart is a 78 y.o. who presents for a Medicare Wellness preventive visit.  As a reminder, Annual Wellness Visits don't include a physical exam, and some assessments may be limited, especially if this visit is performed virtually. We may recommend an in-person follow-up visit with your provider if needed.  Visit Complete: Virtual I connected with  Mark Stewart on 06/18/24 by a audio enabled telemedicine application and verified that I am speaking with the correct person using two identifiers.  Patient Location: Home  Provider Location: Home Office  I discussed the limitations of evaluation and management by telemedicine. The patient expressed understanding and agreed to proceed.  Vital Signs: Because this visit was a virtual/telehealth visit, some criteria may be missing or patient reported. Any vitals not documented were not able to be obtained and vitals that have been documented are patient reported.  VideoDeclined- This patient declined Librarian, academic. Therefore the visit was completed with audio only.  Persons Participating in Visit: Patient.  AWV Questionnaire: No: Patient Medicare AWV questionnaire was not completed prior to this visit.  Cardiac Risk Factors include: advanced age (>67men, >42 women);diabetes mellitus;dyslipidemia;hypertension;male gender     Objective:    Today's Vitals   06/18/24 1037  Weight: 209 lb (94.8 kg)  Height: 6' (1.829 m)   Body mass index is 28.35 kg/m.     06/18/2024   11:13 AM 06/18/2023   10:21 AM 08/29/2020   11:15 AM 06/16/2020    9:47 PM 05/02/2016   10:55 AM 04/30/2016    1:48 PM 04/12/2016    4:40 PM  Advanced Directives  Does Patient Have a Medical Advance Directive? No Yes Yes No No     Type of Special educational needs teacher of [place];Living will Healthcare Power of [place];Living will      Copy of Healthcare Power of Attorney in Chart?  No - copy requested No -  copy requested      Would patient like information on creating a medical advance directive? Yes (MAU/Ambulatory/Procedural Areas - Information given)   No - Patient declined Yes - Educational materials given  Yes - Educational materials given  Yes - Transport planner given      Data saved with a previous flowsheet row definition    Current Medications (verified) Outpatient Encounter Medications as of 06/18/2024  Medication Sig   amLODipine  (NORVASC ) 10 MG tablet TAKE 1 TABLET BY MOUTH EVERY DAY   aspirin  EC 81 MG tablet Take 81 mg by mouth daily.   atorvastatin  (LIPITOR) 80 MG tablet TAKE 1 TABLET BY MOUTH EVERY DAY   B-D UF III MINI PEN NEEDLES 31G X 5 MM MISC    buPROPion (WELLBUTRIN XL) 150 MG 24 hr tablet Take 150 mg by mouth daily.   carvedilol  (COREG ) 25 MG tablet Take 1 tablet (25 mg total) by mouth 2 (two) times daily with a meal.   Cholecalciferol (VITAMIN D3 PO) Take by mouth daily.   cloNIDine  (CATAPRES  - DOSED IN MG/24 HR) 0.2 mg/24hr patch APPLY 1 PATCH TO SKIN WEEKLY   empagliflozin (JARDIANCE) 25 MG TABS tablet Take 25 mg by mouth daily.    fluticasone  (FLONASE ) 50 MCG/ACT nasal spray PLACE 2 SPRAYS INTO BOTH NOSTRILS DAILY AS NEEDED FOR ALLERGIES OR RHINITIS. REPORTED ON 04/26/2016   Insulin  Disposable Pump (OMNIPOD DASH 5 PACK PODS) MISC    minoxidil  (LONITEN ) 10 MG tablet TAKE 1/2 TABLET BY MOUTH DAILY   montelukast  (SINGULAIR ) 10 MG  tablet TAKE 1 TABLET BY MOUTH EVERYDAY AT BEDTIME   NOVOLOG  100 UNIT/ML injection USE AS DIRECTED IN INSULIN  PUMP. MDD: 100UNITS/DAY   omeprazole  (PRILOSEC) 40 MG capsule TAKE 1 CAPSULE (40 MG TOTAL) BY MOUTH DAILY.   Semaglutide,0.25 or 0.5MG /DOS, (OZEMPIC, 0.25 OR 0.5 MG/DOSE,) 2 MG/3ML SOPN Inject 0.5 mg into the skin.   sildenafil (VIAGRA) 100 MG tablet Take 100 mg by mouth as needed.   spironolactone  (ALDACTONE ) 50 MG tablet Take 1 tablet (50 mg total) by mouth daily.   tamsulosin (FLOMAX) 0.4 MG CAPS capsule Take 0.4 mg by mouth daily  after supper. Take 1 tablet 1/2 hour after supper   valACYclovir (VALTREX) 1000 MG tablet Take 1,000 mg by mouth 3 (three) times daily.   valsartan  (DIOVAN ) 320 MG tablet Take 1 tablet (320 mg total) by mouth daily.   Vitamin D , Ergocalciferol , (DRISDOL ) 1.25 MG (50000 UNIT) CAPS capsule Take 1 capsule (50,000 Units total) by mouth every 7 (seven) days.   [DISCONTINUED] amLODipine  (NORVASC ) 10 MG tablet TAKE 1 TABLET BY MOUTH EVERY DAY   [DISCONTINUED] atorvastatin  (LIPITOR) 80 MG tablet Take 1 tablet (80 mg total) by mouth daily.   [DISCONTINUED] fluticasone  (FLONASE ) 50 MCG/ACT nasal spray PLACE 2 SPRAYS INTO BOTH NOSTRILS DAILY AS NEEDED FOR ALLERGIES OR RHINITIS. REPORTED ON 04/26/2016   No facility-administered encounter medications on file as of 06/18/2024.    Allergies (verified) Bactrim  [sulfamethoxazole -trimethoprim ], Benazepril, Metformin and related, and Penicillins   History: Past Medical History:  Diagnosis Date   Allergy    Cancer (HCC)    skin, hand   Cataract    history of surgery bilaterally   Hypercholesteremia    Hypertension    ICAO (internal carotid artery occlusion)    Spleen absent    patient states had spleen removed in high school   Stroke [facility]) 02/2016   Past Surgical History:  Procedure Laterality Date   EYE SURGERY     HERNIA REPAIR  02/28/2018   HERNIA REPAIR Bilateral 11/2019   PROSTATE SURGERY     SPLENECTOMY     Family History  Problem Relation Age of Onset   Breast cancer Mother    Diabetes Mellitus II Father    Breast cancer Sister    Sleep apnea Neg Hx    Social History   Socioeconomic History   Marital status: Married    Spouse name: graye   Number of children: Not on file   Years of education: Not on file   Highest education level: Not on file  Occupational History   Not on file  Tobacco Use   Smoking status: Former    Current packs/day: 1.00    Average packs/day: 1 pack/day for 20.0 years (20.0 ttl pk-yrs)    Types:  Cigarettes   Smokeless tobacco: Never   Tobacco comments:    patient states last cigarette 35 years ago  Substance and Sexual Activity   Alcohol use: No   Drug use: No   Sexual activity: Not Currently  Other Topics Concern   Not on file  Social History Narrative   Lives with wife   Right Handed   Drinks 4-5 cups caffeine daily   Social Drivers of Health   Financial Resource Strain: Low Risk  (06/18/2024)   Overall Financial Resource Strain (CARDIA)    Difficulty of Paying Living Expenses: Not hard at all  Food Insecurity: No Food Insecurity (06/18/2024)   Hunger Vital Sign    Worried About Programme researcher, broadcasting/film/video in  the Last Year: Never true    Ran Out of Food in the Last Year: Never true  Transportation Needs: No Transportation Needs (06/18/2024)   PRAPARE - Administrator, Civil Service (Medical): No    Lack of Transportation (Non-Medical): No  Physical Activity: Inactive (06/18/2024)   Exercise Vital Sign    Days of Exercise per Week: 0 days    Minutes of Exercise per Session: 0 min  Stress: No Stress Concern Present (06/18/2024)   [employer] of Occupational Health - Occupational Stress Questionnaire    Feeling of Stress: Not at all  Social Connections: Socially Integrated (06/18/2024)   Social Connection and Isolation Panel    Frequency of Communication with Friends and Family: More than three times a week    Frequency of Social Gatherings with Friends and Family: More than three times a week    Attends Religious Services: More than 4 times per year    Active Member of [employer] or Organizations: Yes    Attends Engineer, structural: More than 4 times per year    Marital Status: Married    Tobacco Counseling Counseling given: Not Answered Tobacco comments: patient states last cigarette 35 years ago    Clinical Intake:  Pre-visit preparation completed: Yes  Pain : No/denies pain  Diabetes: Yes CBG done?: No Did pt. bring in CBG monitor from  home?: No  Lab Results  Component Value Date   HGBA1C 9.2 (H) 12/27/2023   HGBA1C 8.8 (H) 04/25/2023   HGBA1C 7.4 09/17/2022   HGBA1C 7.4 09/17/2022     How often do you need to have someone help you when you read instructions, pamphlets, or other written materials from your doctor or pharmacy?: 1 - Never  Interpreter Needed?: No  Information entered by :: Charmaine Bloodgood LPN   Activities of Daily Living     06/18/2024   10:38 AM  In your present state of health, do you have any difficulty performing the following activities:  Hearing? 0  Vision? 0  Difficulty concentrating or making decisions? 0  Walking or climbing stairs? 0  Dressing or bathing? 0  Doing errands, shopping? 0  Preparing Food and eating ? N  Using the Toilet? N  In the past six months, have you accidently leaked urine? N  Do you have problems with loss of bowel control? N  Managing your Medications? N  Managing your Finances? N  Housekeeping or managing your Housekeeping? N    Patient Care Team: Nicholaus Credit, DEVONNA as PCP - General (Physician Assistant) Francyne Headland, MD as PCP - Cardiology (Cardiology) Joshua Lionel Reggy DEVONNA (Internal Medicine) Marda General, MD as Consulting Physician (Urology) [place], [place], DPM as Consulting Physician (Podiatry) Whitfield Raisin, NP as Nurse Practitioner (Neurology) Arloa Brunner, OD (Optometry)  I have updated your Care Teams any recent Medical Services you may have received from other providers in the past year.     Assessment:   This is a routine wellness examination for [place].  Hearing/Vision screen Hearing Screening - Comments:: Denies hearing difficulties   Vision Screening - Comments:: Wears rx glasses - up to date with routine eye exams with [facility]    Goals Addressed             This Visit's Progress    Maintain health and independence   On track      Depression Screen     06/18/2024   10:39 AM 02/25/2024    2:27  PM 08/28/2023   11:13 AM 06/18/2023   10:17 AM 04/22/2023   11:19 AM 03/28/2023    3:00 PM 10/16/2022   11:18 AM  PHQ 2/9 Scores  PHQ - 2 Score 0 0 0 0 0 0 0  PHQ- 9 Score  0 0 0 0 1     Fall Risk     06/18/2024   11:13 AM 08/28/2023   11:14 AM 06/18/2023   10:19 AM 04/22/2023   11:19 AM 03/28/2023    3:00 PM  Fall Risk   Falls in the past year? 1 1 0 0 0  Number falls in past yr: 1 1 0 0 0  Injury with Fall? 0 0 0 0 0  Risk for fall due to : Impaired mobility No Fall Risks No Fall Risks No Fall Risks No Fall Risks  Follow up Education provided;Falls prevention discussed;Falls evaluation completed Falls evaluation completed Falls prevention discussed Falls evaluation completed Falls evaluation completed    MEDICARE RISK AT HOME:  Medicare Risk at Home Any stairs in or around the home?: No If so, are there any without handrails?: No Home free of loose throw rugs in walkways, pet beds, electrical cords, etc?: Yes Adequate lighting in your home to reduce risk of falls?: Yes Life alert?: No Use of a cane, walker or w/c?: Yes (at times) Grab bars in the bathroom?: Yes Shower chair or bench in shower?: No Elevated toilet seat or a handicapped toilet?: Yes  TIMED UP AND GO:  Was the test performed?  No  Cognitive Function: 6CIT completed    10/25/2017    8:46 AM 04/26/2016    1:02 PM  MMSE - Mini Mental State Exam  Orientation to time 5  5   Orientation to Place 5  5   Registration 3  3   Attention/ Calculation 4  5   Recall 2  3   Language- name 2 objects 2  2   Language- repeat 0 1  Language- follow 3 step command 3  3   Language- read & follow direction 1  1   Write a sentence 1  1   Copy design 1  1   Total score 27  30      Data saved with a previous flowsheet row definition        06/18/2024   11:14 AM 06/18/2023   10:21 AM 07/02/2022    4:12 PM 08/29/2020   11:19 AM  6CIT Screen  What Year? 0 points 0 points 0 points 0 points  What month? 0 points 0 points 0  points 0 points  What time? 0 points 0 points 0 points 0 points  Count back from 20 0 points 0 points 0 points 0 points  Months in reverse 0 points 0 points 0 points 0 points  Repeat phrase 0 points 0 points 0 points 0 points  Total Score 0 points 0 points 0 points 0 points    Immunizations Immunization History  Administered Date(s) Administered   Fluad Quad(high Dose 65+) 06/24/2020, 07/24/2021, 08/20/2022   Fluad Trivalent(High Dose 65+) 08/28/2023   INFLUENZA, HIGH DOSE SEASONAL PF 07/05/2016, 12/18/2017, 08/20/2018   Influenza-Unspecified 07/02/2019   Moderna Sars-Covid-2 Vaccination 11/20/2019, 12/16/2019, 09/23/2020   Pfizer(Comirnaty)Fall Seasonal Vaccine 12 years and older 08/20/2022, 08/28/2023   Pneumococcal Conjugate-13 08/01/2015   Pneumococcal Polysaccharide-23 10/08/1997, 09/16/2020    Screening Tests Health Maintenance  Topic Date Due   DTaP/Tdap/Td (1 - Tdap) Never done   Colonoscopy  08/22/2022   Influenza Vaccine  05/08/2024   COVID-19 Vaccine (6 - Moderna risk 2024-25 season) 06/08/2024   HEMOGLOBIN A1C  06/28/2024   FOOT EXAM  08/27/2024   Diabetic kidney evaluation - eGFR measurement  12/26/2024   Diabetic kidney evaluation - Urine ACR  12/30/2024   OPHTHALMOLOGY EXAM  06/03/2025   Medicare Annual Wellness (AWV)  06/18/2025   Pneumococcal Vaccine: 50+ Years  Completed   HPV VACCINES  Aged Out   Meningococcal B Vaccine  Aged Out   Hepatitis C Screening  Discontinued   Zoster Vaccines- Shingrix  Discontinued    Health Maintenance Items Addressed: Information provided on vaccine recommendations;  patient would like to discuss with pcp the need for continued colon screenings before consenting to referral   Additional Screening:  Vision Screening: Recommended annual ophthalmology exams for early detection of glaucoma and other disorders of the eye. Is the patient up to date with their annual eye exam?  Yes  Who is the provider or what is the name of the  office in which the patient attends annual eye exams? [facility]   Dental Screening: Recommended annual dental exams for proper oral hygiene  Community Resource Referral / Chronic Care Management: CRR required this visit?  No   CCM required this visit?  No   Plan:    I have personally reviewed and noted the following in the patient's chart:   Medical and social history Use of alcohol, tobacco or illicit drugs  Current medications and supplements including opioid prescriptions. Patient is not currently taking opioid prescriptions. Functional ability and status Nutritional status Physical activity Advanced directives List of other physicians Hospitalizations, surgeries, and ER visits in previous 12 months Vitals Screenings to include cognitive, depression, and falls Referrals and appointments  In addition, I have reviewed and discussed with patient certain preventive protocols, quality metrics, and best practice recommendations. A written personalized care plan for preventive services as well as general preventive health recommendations were provided to patient.   Lavelle Pfeiffer [place], [place]   [id number]   After Visit Summary: (Pick Up) Due to this being a telephonic visit, with patients personalized plan was offered to patient and patient has requested to Pick up at office.  Notes: PCP Follow Up Recommendations: Patient is complaining of intermittent dizziness over the last 6 months; would like to discuss at next visit

## 2024-06-18 NOTE — Patient Instructions (Addendum)
 [name],  Thank you for taking the time for your Medicare Wellness Visit. I appreciate your continued commitment to your health goals. Please review the care plan we discussed, and feel free to reach out if I can assist you further.  Medicare recommends these wellness visits once per year to help you and your care team stay ahead of potential health issues. These visits are designed to focus on prevention, allowing your provider to concentrate on managing your acute and chronic conditions during your regular appointments.  Please note that Annual Wellness Visits do not include a physical exam. Some assessments may be limited, especially if the visit was conducted virtually. If needed, we may recommend a separate in-person follow-up with your provider.  Ongoing Care Seeing your primary care provider every 3 to 6 months helps us  monitor your health and provide consistent, personalized care.   Referrals If a referral was made during today's visit and you haven't received any updates within two weeks, please contact the referred provider directly to check on the status.  Recommended Screenings:  Health Maintenance  Topic Date Due   DTaP/Tdap/Td vaccine (1 - Tdap) Never done   Colon Cancer Screening  08/22/2022   Flu Shot  05/08/2024   COVID-19 Vaccine (6 - Moderna risk 2024-25 season) 06/08/2024   Hemoglobin A1C  06/28/2024   Complete foot exam   08/27/2024   Yearly kidney function blood test for diabetes  12/26/2024   Yearly kidney health urinalysis for diabetes  12/30/2024   Eye exam for diabetics  06/03/2025   Medicare Annual Wellness Visit  06/18/2025   Pneumococcal Vaccine for age over 52  Completed   HPV Vaccine  Aged Out   Meningitis B Vaccine  Aged Out   Hepatitis C Screening  Discontinued   Zoster (Shingles) Vaccine  Discontinued       06/18/2024   11:13 AM  Advanced Directives  Does Patient Have a Medical Advance Directive? No  Would patient like information on  creating a medical advance directive? Yes (MAU/Ambulatory/Procedural Areas - Information given)   Advance Care Planning is important because it: Ensures you receive medical care that aligns with your values, goals, and preferences. Provides guidance to your family and loved ones, reducing the emotional burden of decision-making during critical moments.  Information on Advanced Care Planning can be found at [place]  Secretary of [facility] Advance Health Care Directives Advance Health Care Directives ([email or web address]   Vision: Annual vision screenings are recommended for early detection of glaucoma, cataracts, and diabetic retinopathy. These exams can also reveal signs of chronic conditions such as diabetes and high blood pressure.  Dental: Annual dental screenings help detect early signs of oral cancer, gum disease, and other conditions linked to overall health, including heart disease and diabetes.  Please see the attached documents for additional preventive care recommendations.

## 2024-06-19 ENCOUNTER — Encounter (HOSPITAL_COMMUNITY): Payer: Self-pay

## 2024-06-19 DIAGNOSIS — E162 Hypoglycemia, unspecified: Secondary | ICD-10-CM | POA: Diagnosis not present

## 2024-06-19 DIAGNOSIS — R4781 Slurred speech: Secondary | ICD-10-CM | POA: Diagnosis not present

## 2024-06-19 DIAGNOSIS — I517 Cardiomegaly: Secondary | ICD-10-CM | POA: Diagnosis not present

## 2024-06-25 ENCOUNTER — Other Ambulatory Visit: Payer: Self-pay | Admitting: Physician Assistant

## 2024-06-25 DIAGNOSIS — K2100 Gastro-esophageal reflux disease with esophagitis, without bleeding: Secondary | ICD-10-CM

## 2024-06-26 ENCOUNTER — Ambulatory Visit (INDEPENDENT_AMBULATORY_CARE_PROVIDER_SITE_OTHER): Admitting: Podiatry

## 2024-06-26 DIAGNOSIS — E1142 Type 2 diabetes mellitus with diabetic polyneuropathy: Secondary | ICD-10-CM

## 2024-06-26 DIAGNOSIS — M79674 Pain in right toe(s): Secondary | ICD-10-CM | POA: Diagnosis not present

## 2024-06-26 DIAGNOSIS — B351 Tinea unguium: Secondary | ICD-10-CM

## 2024-06-26 DIAGNOSIS — M79675 Pain in left toe(s): Secondary | ICD-10-CM | POA: Diagnosis not present

## 2024-06-26 NOTE — Progress Notes (Addendum)
       Subjective:  Patient ID: Mark Stewart, male    DOB: 1946/08/05,  MRN: [id number]  Mark Stewart presents to clinic today for:  Chief Complaint  Patient presents with   [facility]    [facility] no callous A1c 7.8 in July Plavix and ASA   Patient notes nails are thick, discolored, elongated and painful in shoegear when trying to ambulate.  Patient states he had a mini stroke last week with no residual effects at this time.  This is his second mini stroke.  PCP is Nicholaus Credit, PA-C.  Past Medical History:  Diagnosis Date   Allergy    Cancer (HCC)    skin, hand   Cataract    history of surgery bilaterally   Hypercholesteremia    Hypertension    ICAO (internal carotid artery occlusion)    Spleen absent    patient states had spleen removed in high school   Stroke [facility]) 02/2016    Past Surgical History:  Procedure Laterality Date   EYE SURGERY     HERNIA REPAIR  02/28/2018   HERNIA REPAIR Bilateral 11/2019   PROSTATE SURGERY     SPLENECTOMY      Allergies  Allergen Reactions   Bactrim  [Sulfamethoxazole -Trimethoprim ] Hives   Benazepril Cough   Metformin And Related Rash   Penicillins Rash    Has patient had a PCN reaction causing immediate rash, facial/tongue/throat swelling, SOB or lightheadedness with hypotension: yes Has patient had a PCN reaction causing severe rash involving mucus membranes or skin necrosis: no Has patient had a PCN reaction that required hospitalization: no Has patient had a PCN reaction occurring within the last 10 years: no If all of the above answers are NO, then may proceed with Cephalosporin use.    Review of Systems: Negative except as noted in the HPI.  Objective:  Mark Stewart is a pleasant 78 y.o. male in NAD. AAO x 3.  Vascular Examination: Capillary refill time is 3-5 seconds to toes bilateral. Palpable pedal pulses b/l LE. Digital hair present b/l.  Skin temperature gradient WNL b/l. No varicosities b/l. No cyanosis noted b/l.    Dermatological Examination: Pedal skin with normal turgor, texture and tone b/l. No open wounds. No interdigital macerations b/l. Toenails x10 are 3mm thick, discolored, dystrophic with subungual debris. There is pain with compression of the nail plates.  They are elongated x10     Latest Ref Rng & Units 07/07/2024   11:47 AM 12/27/2023    8:48 AM  Hemoglobin A1C  Hemoglobin-A1c 4.8 - 5.6 % 8.3  9.2    Assessment/Plan: 1. Pain due to onychomycosis of toenails of both feet   2. Diabetic polyneuropathy associated with type 2 diabetes mellitus (HCC)    The mycotic toenails were sharply debrided x10 with sterile nail nippers and a power debriding burr to decrease bulk/thickness and length.    Follow-up in 3 months   Davita Sublett D. Keshia Weare, DPM, FACFAS [facility]     [address] [place], [place] [address]                Office [phone]  Fax [phone]

## 2024-06-30 ENCOUNTER — Telehealth: Payer: Self-pay | Admitting: Physician Assistant

## 2024-06-30 NOTE — Telephone Encounter (Signed)
 Patient is calling in to notify the provider that he recently had a medical emergency where he suffered a TIA stroke. He reports that this happened on or around 9/12. He was instructed to contact the provider as he needs to get his cholesterol (152)  and diabetes (A1C 8) under control.   Patient notes that on 9/30 he as an appointment with his PCP.   Please contact patient at [phone]

## 2024-06-30 NOTE — Telephone Encounter (Signed)
 Called patient back left voicemail to call office back

## 2024-06-30 NOTE — Telephone Encounter (Signed)
 Copied from CRM #[id number]. Topic: General - Other >> Jun 30, 2024  9:07 AM Tobias CROME wrote: Reason for CRM: Patient requesting to speak to office directly. Patient would like to speak to [facility] Patient declined to give more information.   Requesting callback, [phone]

## 2024-07-01 ENCOUNTER — Ambulatory Visit (INDEPENDENT_AMBULATORY_CARE_PROVIDER_SITE_OTHER): Admitting: Neuroradiology

## 2024-07-01 ENCOUNTER — Encounter: Payer: Self-pay | Admitting: Neuroradiology

## 2024-07-01 VITALS — BP 135/70 | HR 71 | Ht 72.0 in | Wt 211.0 lb

## 2024-07-01 DIAGNOSIS — I6523 Occlusion and stenosis of bilateral carotid arteries: Secondary | ICD-10-CM | POA: Diagnosis not present

## 2024-07-01 DIAGNOSIS — E118 Type 2 diabetes mellitus with unspecified complications: Secondary | ICD-10-CM | POA: Diagnosis not present

## 2024-07-01 DIAGNOSIS — G459 Transient cerebral ischemic attack, unspecified: Secondary | ICD-10-CM

## 2024-07-01 NOTE — Progress Notes (Signed)
 Progress Note: Referring Physician:  Nicholaus Credit, PA-C [address] [place],  [place] [address]  Primary Physician:  Nicholaus Credit, PA-C  Chief Complaint: Transient ischemic attack  History of Present Illness: Mark Stewart is a 78 y.o. male who presents with the chief complaint of transient ischemic attack.  On September 12 he was with his wife in the car and developed garbled speech.  He also seemed to have odd movements of both arms.  She took him to the emergency room where they did imaging tests.  The symptoms lasted for several hours.  He was discharged.  They started clopidogrel.  He was already on aspirin .  His wife says he had a stroke in 2017.  Apparently he had relatively nonspecific symptoms of dizziness and balance difficulty which resolved.  He was initially seen at [facility] and then transferred to [facility].  He had a brain MRI 02/26/2016 and CT arteriogram 02/27/2016, both of which I reviewed.  The MRI showed a right thalamic acute infarct.  The CT arteriogram showed carotid atherosclerosis without significant stenosis.  His risk factors for vascular disease include insulin -dependent diabetes, hypertension and dyslipidemia.      Review of Systems:  Review of Systems  Respiratory:  Negative for shortness of breath.   Cardiovascular:  Negative for chest pain.  Gastrointestinal:  Negative for blood in stool.    Exam: Today's Vitals   07/01/24 1515  BP: 135/70  Pulse: 71  SpO2: 98%  Weight: 211 lb (95.7 kg)  Height: 6' (1.829 m)   Body mass index is 28.62 kg/m.   Physical Exam Constitutional:      Appearance: Normal appearance. He is normal weight.  Cardiovascular:     Rate and Rhythm: Normal rate and regular rhythm.     Heart sounds: Normal heart sounds.     Comments: Femoral and radial pulses are normal. Pulmonary:     Effort: Pulmonary effort is normal.     Breath sounds: Normal breath sounds.  Abdominal:     Palpations: Abdomen is soft.   Neurological:     Mental Status: He is alert.     Comments: He is alert and oriented.  Normal speech fluency, comprehension and expression. He has a left homonymous hemianopsia. There is no drift of either arm.  His grip strength is normal.  No sensory deficit.  No inattention.  No ataxia on heel toe testing.  He does have an unsteady gait.   I reviewed his medications which include aspirin  81 mg, atorvastatin  80 mg and clopidogrel 75 mg   Imaging:   I reviewed his CT, brain MRI and CT arteriogram which were obtained at [facility] on September 11 and June 19, 2024.  The brain MRI shows the old lacunar infarct in the right side of the thalamus.  No acute infarct.  The CT shows no hemorrhage.  The CT arteriogram shows bilateral carotid plaque with less than 50% stenosis, and a stenosis of the supraclinoid right ICA that appears to be about 70%.  Assessment and Plan:  Transient neurologic symptoms of garbled speech without acute infarct.  Bilateral carotid plaque with less than 50% stenosis.  70% right supraclinoid ICA stenosis.  Old right thalamic infarct.  I think it is unlikely that the supraclinoid ICA stenosis (or the carotid stenoses) are responsible for the symptoms.  I think the event was more likely a small vessel TIA.  My recommendation is continuing with medical therapy for his vascular risk factors.  There is no surgery needed.  My recommendation is stopping the clopidogrel after 3 months if he has no additional events, and continuing low-dose aspirin .  No routine imaging follow-up is needed.

## 2024-07-02 ENCOUNTER — Ambulatory Visit: Admitting: Physician Assistant

## 2024-07-07 ENCOUNTER — Encounter: Payer: Self-pay | Admitting: Cardiovascular Disease

## 2024-07-07 ENCOUNTER — Encounter: Payer: Self-pay | Admitting: Physician Assistant

## 2024-07-07 ENCOUNTER — Telehealth: Payer: Self-pay | Admitting: Cardiovascular Disease

## 2024-07-07 ENCOUNTER — Ambulatory Visit: Admitting: Physician Assistant

## 2024-07-07 ENCOUNTER — Ambulatory Visit: Attending: Cardiovascular Disease | Admitting: Cardiovascular Disease

## 2024-07-07 ENCOUNTER — Ambulatory Visit

## 2024-07-07 VITALS — BP 130/60 | HR 89 | Temp 98.0°F | Resp 18 | Ht 72.0 in | Wt 215.0 lb

## 2024-07-07 VITALS — BP 130/56 | HR 71 | Ht 72.0 in | Wt 216.0 lb

## 2024-07-07 DIAGNOSIS — E785 Hyperlipidemia, unspecified: Secondary | ICD-10-CM

## 2024-07-07 DIAGNOSIS — E559 Vitamin D deficiency, unspecified: Secondary | ICD-10-CM

## 2024-07-07 DIAGNOSIS — I10 Essential (primary) hypertension: Secondary | ICD-10-CM | POA: Diagnosis not present

## 2024-07-07 DIAGNOSIS — I69354 Hemiplegia and hemiparesis following cerebral infarction affecting left non-dominant side: Secondary | ICD-10-CM

## 2024-07-07 DIAGNOSIS — G459 Transient cerebral ischemic attack, unspecified: Secondary | ICD-10-CM | POA: Diagnosis not present

## 2024-07-07 DIAGNOSIS — R42 Dizziness and giddiness: Secondary | ICD-10-CM

## 2024-07-07 DIAGNOSIS — N400 Enlarged prostate without lower urinary tract symptoms: Secondary | ICD-10-CM

## 2024-07-07 DIAGNOSIS — R55 Syncope and collapse: Secondary | ICD-10-CM

## 2024-07-07 DIAGNOSIS — I152 Hypertension secondary to endocrine disorders: Secondary | ICD-10-CM | POA: Diagnosis not present

## 2024-07-07 DIAGNOSIS — R5383 Other fatigue: Secondary | ICD-10-CM

## 2024-07-07 DIAGNOSIS — E1165 Type 2 diabetes mellitus with hyperglycemia: Secondary | ICD-10-CM

## 2024-07-07 DIAGNOSIS — N1831 Chronic kidney disease, stage 3a: Secondary | ICD-10-CM

## 2024-07-07 DIAGNOSIS — E1159 Type 2 diabetes mellitus with other circulatory complications: Secondary | ICD-10-CM | POA: Diagnosis not present

## 2024-07-07 DIAGNOSIS — Z23 Encounter for immunization: Secondary | ICD-10-CM | POA: Diagnosis not present

## 2024-07-07 DIAGNOSIS — I749 Embolism and thrombosis of unspecified artery: Secondary | ICD-10-CM

## 2024-07-07 DIAGNOSIS — Z794 Long term (current) use of insulin: Secondary | ICD-10-CM | POA: Diagnosis not present

## 2024-07-07 DIAGNOSIS — E1169 Type 2 diabetes mellitus with other specified complication: Secondary | ICD-10-CM

## 2024-07-07 MED ORDER — CARVEDILOL 12.5 MG PO TABS: 12.5000 mg | ORAL_TABLET | Freq: Two times a day (BID) | ORAL | 11 refills | Status: DC

## 2024-07-07 NOTE — Telephone Encounter (Signed)
 Per Dr Francyne: Discussed. Not having syncope, just more easily fatigued, occasionally dizzy. Needs f/u appt please. Recommend a 1 week zio before the appt, please   Order placed for monitor. Will need an appt with APP or Dr Francyne in 1.5-2 months.

## 2024-07-07 NOTE — Progress Notes (Signed)
 Subjective:  Patient ID: Mark Stewart, male    DOB: 23-Jun-1946  Age: 78 y.o. MRN: [id number]  Chief Complaint  Patient presents with   Medical Management of Chronic Issues    HPI   Pt presents for follow up of hypertension. Patient has had hypertension for more than 20 years.  He states that this is managed by cardiology and pt is unsure when his last appt was-  He is on multiple medications including norvasc  10, coreg  25, clonidine  patch,  minoxidil  10, aldactone  50 and diovan  320 Denies chest pain or dyspnea however he says that over the past few months he has had episodes of syncope as well as more fatigued with normal activities than usual.  His wife states that he seems to have to sit down a lot because he is tired after getting up and doing things around house and outside Pt did have TIA a few weeks ago and was admitted to [facility] -  Pt had follow up with neurosurgeon last week for TIA - per pt and wife they were supposed to have been contacted regarding his CT /MRI imaging and to see if he would be a candidate for a stent however that office has not gotten back with them and they will reach out to the office today. Pt was placed on plavix 75mg  qd in addition to aspirin  81mg  qd He did have CTA of head/neck (which is to be reviewed by neurosurgeon as mentioned above) - MRI brain (showed old infarct) but has not had carotid ultrasound - recommend to go ahead and schedule and will reach out to cardiology to make follow up appt   Pt with longstanding history of almost 30 years of diabetes.  He follows with Dr Tobie endocrinologist regularly. His next appt is 07/15/24 --- He is currently on Novolog  through [facility] and Jardiance 25mg  He states his glucose readings have been more elevated recently He is up to date on eye exam   Pt with history of hematuria - he follows with Dr Marda regularly   Pt with obstructive sleep apnea and does use CPAP nightly - good compliance  Pt with  vit D def - he is taking a daily supplement but recent labs did show low level - will start weekly rx instead of daily otc supplement  Pt would like flu shot today Current Outpatient Medications on File Prior to Visit  Medication Sig Dispense Refill   amLODipine  (NORVASC ) 10 MG tablet TAKE 1 TABLET BY MOUTH EVERY DAY 90 tablet 0   aspirin  EC 81 MG tablet Take 81 mg by mouth daily.     atorvastatin  (LIPITOR) 80 MG tablet TAKE 1 TABLET BY MOUTH EVERY DAY 90 tablet 1   B-D UF III MINI PEN NEEDLES 31G X 5 MM MISC      carvedilol  (COREG ) 25 MG tablet Take 1 tablet (25 mg total) by mouth 2 (two) times daily with a meal. 180 tablet 2   Cholecalciferol (VITAMIN D3 PO) Take by mouth daily.     cloNIDine  (CATAPRES  - DOSED IN MG/24 HR) 0.2 mg/24hr patch APPLY 1 PATCH TO SKIN WEEKLY 12 patch 3   clopidogrel (PLAVIX) 75 MG tablet Take 75 mg by mouth daily.     empagliflozin (JARDIANCE) 25 MG TABS tablet Take 25 mg by mouth daily.      fluticasone  (FLONASE ) 50 MCG/ACT nasal spray PLACE 2 SPRAYS INTO BOTH NOSTRILS DAILY AS NEEDED FOR ALLERGIES OR RHINITIS. REPORTED ON 04/26/2016 48 mL  1   Insulin  Disposable Pump (OMNIPOD DASH 5 PACK PODS) MISC      minoxidil  (LONITEN ) 10 MG tablet TAKE 1/2 TABLET BY MOUTH DAILY 45 tablet 2   NOVOLOG  100 UNIT/ML injection USE AS DIRECTED IN INSULIN  PUMP. MDD: 100UNITS/DAY     sildenafil (VIAGRA) 100 MG tablet Take 100 mg by mouth as needed.     spironolactone  (ALDACTONE ) 50 MG tablet Take 1 tablet (50 mg total) by mouth daily. 90 tablet 3   valsartan  (DIOVAN ) 320 MG tablet Take 1 tablet (320 mg total) by mouth daily. 90 tablet 2   Vitamin D , Ergocalciferol , (DRISDOL ) 1.25 MG (50000 UNIT) CAPS capsule Take 1 capsule (50,000 Units total) by mouth every 7 (seven) days. 5 capsule 5   No current facility-administered medications on file prior to visit.   Past Medical History:  Diagnosis Date   Allergy    Cancer (HCC)    skin, hand   Cataract    history of surgery  bilaterally   Hypercholesteremia    Hypertension    ICAO (internal carotid artery occlusion)    Spleen absent    patient states had spleen removed in high school   Stroke [facility]) 02/2016   Past Surgical History:  Procedure Laterality Date   EYE SURGERY     HERNIA REPAIR  02/28/2018   HERNIA REPAIR Bilateral 11/2019   PROSTATE SURGERY     SPLENECTOMY      Family History  Problem Relation Age of Onset   Breast cancer Mother    Diabetes Mellitus II Father    Breast cancer Sister    Sleep apnea Neg Hx    Social History   Socioeconomic History   Marital status: Married    Spouse name: graye   Number of children: Not on file   Years of education: Not on file   Highest education level: Not on file  Occupational History   Not on file  Tobacco Use   Smoking status: Former    Current packs/day: 1.00    Average packs/day: 1 pack/day for 20.0 years (20.0 ttl pk-yrs)    Types: Cigarettes   Smokeless tobacco: Never   Tobacco comments:    patient states last cigarette 35 years ago  Substance and Sexual Activity   Alcohol use: No   Drug use: No   Sexual activity: Not Currently  Other Topics Concern   Not on file  Social History Narrative   Lives with wife   Right Handed   Drinks 4-5 cups caffeine daily   Social Drivers of Health   Financial Resource Strain: Low Risk  (06/18/2024)   Overall Financial Resource Strain (CARDIA)    Difficulty of Paying Living Expenses: Not hard at all  Food Insecurity: No Food Insecurity (06/18/2024)   Hunger Vital Sign    Worried About Running Out of Food in the Last Year: Never true    Ran Out of Food in the Last Year: Never true  Transportation Needs: No Transportation Needs (06/18/2024)   PRAPARE - Administrator, Civil Service (Medical): No    Lack of Transportation (Non-Medical): No  Physical Activity: Inactive (06/18/2024)   Exercise Vital Sign    Days of Exercise per Week: 0 days    Minutes of Exercise per Session: 0 min   Stress: No Stress Concern Present (06/18/2024)   [employer] of Occupational Health - Occupational Stress Questionnaire    Feeling of Stress: Not at all  Social Connections: Socially Integrated (06/18/2024)  Social Connection and Isolation Panel    Frequency of Communication with Friends and Family: More than three times a week    Frequency of Social Gatherings with Friends and Family: More than three times a week    Attends Religious Services: More than 4 times per year    Active Member of Clubs or Organizations: Yes    Attends Engineer, structural: More than 4 times per year    Marital Status: Married  CONSTITUTIONAL:see HPI E/N/T: Negative for ear pain, nasal congestion and sore throat.  CARDIOVASCULAR: see HPI RESPIRATORY: Negative for recent cough and dyspnea.  GASTROINTESTINAL: Negative for abdominal pain, acid reflux symptoms, constipation, diarrhea, nausea and vomiting.  MSK: Negative for arthralgias and myalgias.  INTEGUMENTARY: Negative for rash.  NEUROLOGICAL: see HPI PSYCHIATRIC: Negative for sleep disturbance and to question depression screen.  Negative for depression, negative for anhedonia.       Objective:  PHYSICAL EXAM:   VS: BP 130/60   Pulse 89   Temp 98 F (36.7 C) (Temporal)   Resp 18   Ht 6' (1.829 m)   Wt 215 lb (97.5 kg)   SpO2 97%   BMI 29.16 kg/m   GEN: Well nourished, well developed, in no acute distress  HEENT: normal external ears and nose - normal external auditory canals and TMS - hearing grossly normal - - Lips, Teeth and Gums - normal  Oropharynx - normal mucosa, palate, and posterior pharynx Neck: no JVD or masses - no thyromegaly Cardiac: RRR; no murmurs, rubs, or gallops,no edema -  Respiratory:  normal respiratory rate and pattern with no distress - normal breath sounds with no rales, rhonchi, wheezes or rubs MS: no deformity or atrophy  Skin: warm and dry, no rash  Neuro:  Alert and Oriented x 3, - CN II-Xii grossly  intact Psych: euthymic mood, appropriate affect and demeanor  EKG - no acute changes Diabetic Foot Exam - Simple   No data filed     Lab Results  Component Value Date   WBC 7.7 12/27/2023   HGB 15.2 12/27/2023   HCT 46.6 12/27/2023   PLT 336 12/27/2023   GLUCOSE 255 (H) 12/27/2023   CHOL 123 12/27/2023   TRIG 69 12/27/2023   HDL 34 (L) 12/27/2023   LDLCALC 75 12/27/2023   ALT 19 12/27/2023   AST 13 12/27/2023   NA 140 12/27/2023   K 5.1 12/27/2023   CL 104 12/27/2023   CREATININE 1.39 (H) 12/27/2023   BUN 23 12/27/2023   CO2 23 12/27/2023   TSH 2.740 12/27/2023   HGBA1C 9.2 (H) 12/27/2023   MICROALBUR 150 03/21/2021      Assessment & Plan:   Problem List Items Addressed This Visit       Cardiovascular and Mediastinum    TIA Continue meds Follow up with neurosurgeon as directed Appt made tomorrow with cardiology for follow up  Carotid doppler ordered  Near syncope and fatigue Cardiology consult tomorrow  Hypertension associated with diabetes (HCC)   Relevant Orders   CBC with Differential/Platelet   Comprehensive metabolic panel   Lipid panel Continue current meds  Follow up with cardiology as directed - appt made for tomorrow      Endocrine   Diabetes mellitus with hyperglycemia, with long-term current use of insulin  (HCC) - Primary   Relevant Orders      CBC with Differential/Platelet   Comprehensive metabolic panel   Lipid panel A1c Continue meds and follow up with endocrinology this week  for follow up and to adjust medication     Other   Hyperlipidemia associated with diabetes (HCC)   Relevant Orders   CBC with Differential/Platelet   Comprehensive metabolic panel   Lipid panel Continue meds      BPH with history of hematuria Follow up with urology as directed      Vit D Def Rx ergocalciferol  weekly supplement    Need flu shot Fluzone Hi dose given   .  No orders of the defined types were placed in this  encounter.   Orders Placed This Encounter  Procedures      CBC with Differential/Platelet   Comprehensive metabolic panel   Lipid panel              Follow-up: No follow-ups on file.  An After Visit Summary was printed and given to the patient.  CAMIE JONELLE NICHOLAUS DEVONNA [facility] [phone]

## 2024-07-07 NOTE — Telephone Encounter (Signed)
 Received call from Camie Moats, PA of [facility]--PA reports syncopal episodes over the past month. Patient currently with them in office. Transferred to DOD.

## 2024-07-07 NOTE — Patient Instructions (Signed)
 Medication Instructions:  Decrease Carvedilol  to 12.5 mg twice daily *If you need a refill on your cardiac medications before your next appointment, please call your pharmacy*  Send in a daily BP log in about a week  Lab Work: None ordered If you have labs (blood work) drawn today and your tests are completely normal, you will receive your results only by: MyChart Message (if you have MyChart) OR A paper copy in the mail If you have any lab test that is abnormal or we need to change your treatment, we will call you to review the results.  Testing/Procedures: Your physician has recommended that you wear a 7 DAY ZIO-PATCH monitor. The Zio patch cardiac monitor continuously records heart rhythm data for up to 14 days, this is for patients being evaluated for multiple types heart rhythms. For the first 24 hours post application, please avoid getting the Zio monitor wet in the shower or by excessive sweating during exercise. After that, feel free to carry on with regular activities. Keep soaps and lotions away from the ZIO XT Patch.  This will be mailed to you, please expect 7-10 days to receive.    Applying the monitor   Shave hair from upper left chest.   Hold abrader disc by orange tab.  Rub abrader in 40 strokes over left upper chest as indicated in your monitor instructions.   Clean area with 4 enclosed alcohol pads .  Use all pads to assure are is cleaned thoroughly.  Let dry.   Apply patch as indicated in monitor instructions.  Patch will be place under collarbone on left side of chest with arrow pointing upward.   Rub patch adhesive wings for 2 minutes.Remove white label marked 1.  Remove white label marked 2.  Rub patch adhesive wings for 2 additional minutes.   While looking in a mirror, press and release button in center of patch.  A small green light will flash 3-4 times .  This will be your only indicator the monitor has been turned on.     Do not shower for the first 24  hours.  You may shower after the first 24 hours.   Press button if you feel a symptom. You will hear a small click.  Record Date, Time and Symptom in the Patient Log Book.   When you are ready to remove patch, follow instructions on last 2 pages of Patient Log Book.  Stick patch monitor onto last page of Patient Log Book.   Place Patient Log Book in [place] box.  Use locking tab on box and tape box closed securely.  The [employer] and [employer] has [employer] on it.  Please place in mailbox as soon as possible.  Your physician should have your test results approximately 7 days after the monitor has been mailed back to [facility].   Call [facility] Customer Care at [phone] if you have questions regarding your ZIO XT patch monitor.  Call them immediately if you see an orange light blinking on your monitor.   If your monitor falls off in less than 4 days contact our Monitor department at [phone].  If your monitor becomes loose or falls off after 4 days call [employer] at [phone] for suggestions on securing your monitor   Follow-Up: At [facility], you and your health needs are our priority.  As part of our continuing mission to provide you with exceptional heart care, our providers are all part of one team.  This team  includes your primary Cardiologist (physician) and Advanced Practice Providers or APPs (Physician Assistants and Nurse Practitioners) who all work together to provide you with the care you need, when you need it.  Your next appointment:   1 year(s)  Provider:   Jerel Balding, MD    We recommend signing up for the patient portal called MyChart.  Sign up information is provided on this After Visit Summary.  MyChart is used to connect with patients for Virtual Visits (Telemedicine).  Patients are able to view lab/test results, encounter notes, upcoming appointments, etc.  Non-urgent messages can be sent to your provider as well.   To learn more about  what you can do with MyChart, go to [email or web address].

## 2024-07-07 NOTE — Progress Notes (Unsigned)
Enrolled patient for a 7 day Zio XT monitor to be mailed to patients home  [place] to read

## 2024-07-08 LAB — COMPREHENSIVE METABOLIC PANEL WITH GFR
ALT: 41 IU/L (ref 0–44)
AST: 11 IU/L (ref 0–40)
Albumin: 3.9 g/dL (ref 3.8–4.8)
Alkaline Phosphatase: 114 IU/L (ref 47–123)
BUN/Creatinine Ratio: 15 (ref 10–24)
BUN: 23 mg/dL (ref 8–27)
Bilirubin Total: 0.8 mg/dL (ref 0.0–1.2)
CO2: 22 mmol/L (ref 20–29)
Calcium: 9.1 mg/dL (ref 8.6–10.2)
Chloride: 106 mmol/L (ref 96–106)
Creatinine, Ser: 1.49 mg/dL — ABNORMAL HIGH (ref 0.76–1.27)
Globulin, Total: 2.9 g/dL (ref 1.5–4.5)
Glucose: 171 mg/dL — ABNORMAL HIGH (ref 70–99)
Potassium: 4.5 mmol/L (ref 3.5–5.2)
Sodium: 142 mmol/L (ref 134–144)
Total Protein: 6.8 g/dL (ref 6.0–8.5)
eGFR: 48 mL/min/1.73 — ABNORMAL LOW (ref >59)

## 2024-07-08 LAB — CBC WITH DIFFERENTIAL/PLATELET
Basophils Absolute: 0 x10E3/uL (ref 0.0–0.2)
Basos: 1 %
EOS (ABSOLUTE): 0.6 x10E3/uL — ABNORMAL HIGH (ref 0.0–0.4)
Eos: 8 %
Hematocrit: 46 % (ref 37.5–51.0)
Hemoglobin: 15.2 g/dL (ref 13.0–17.7)
Immature Grans (Abs): 0 x10E3/uL (ref 0.0–0.1)
Immature Granulocytes: 0 %
Lymphocytes Absolute: 1.3 x10E3/uL (ref 0.7–3.1)
Lymphs: 16 %
MCH: 31.5 pg (ref 26.6–33.0)
MCHC: 33 g/dL (ref 31.5–35.7)
MCV: 95 fL (ref 79–97)
Monocytes Absolute: 0.6 x10E3/uL (ref 0.1–0.9)
Monocytes: 8 %
Neutrophils Absolute: 5.6 x10E3/uL (ref 1.4–7.0)
Neutrophils: 67 %
Platelets: 326 x10E3/uL (ref 150–450)
RBC: 4.82 x10E6/uL (ref 4.14–5.80)
RDW: 13 % (ref 11.6–15.4)
WBC: 8.2 x10E3/uL (ref 3.4–10.8)

## 2024-07-08 LAB — HEMOGLOBIN A1C
Est. average glucose Bld gHb Est-mCnc: 192 mg/dL
Hgb A1c MFr Bld: 8.3 % — ABNORMAL HIGH (ref 4.8–5.6)

## 2024-07-08 LAB — LIPID PANEL
Chol/HDL Ratio: 4.7 ratio (ref 0.0–5.0)
Cholesterol, Total: 169 mg/dL (ref 100–199)
HDL: 36 mg/dL — ABNORMAL LOW (ref >39)
LDL Chol Calc (NIH): 112 mg/dL — ABNORMAL HIGH (ref 0–99)
Triglycerides: 117 mg/dL (ref 0–149)
VLDL Cholesterol Cal: 21 mg/dL (ref 5–40)

## 2024-07-08 LAB — TSH: TSH: 2.1 u[IU]/mL (ref 0.450–4.500)

## 2024-07-08 LAB — VITAMIN D 25 HYDROXY (VIT D DEFICIENCY, FRACTURES): Vit D, 25-Hydroxy: 25.6 ng/mL — ABNORMAL LOW (ref 30.0–100.0)

## 2024-07-09 ENCOUNTER — Ambulatory Visit: Payer: Self-pay | Admitting: Physician Assistant

## 2024-07-09 ENCOUNTER — Inpatient Hospital Stay
Admission: RE | Admit: 2024-07-09 | Discharge: 2024-07-09 | Disposition: A | Payer: Self-pay | Source: Ambulatory Visit | Attending: Neurosurgery | Admitting: Neurosurgery

## 2024-07-09 ENCOUNTER — Inpatient Hospital Stay
Admission: RE | Admit: 2024-07-09 | Discharge: 2024-07-09 | Disposition: A | Discharge status: Home / Self Care | Payer: Self-pay | Source: Ambulatory Visit | Attending: Neuroradiology | Admitting: Neuroradiology

## 2024-07-09 ENCOUNTER — Other Ambulatory Visit: Payer: Self-pay

## 2024-07-09 ENCOUNTER — Inpatient Hospital Stay
Admission: RE | Admit: 2024-07-09 | Discharge: 2024-07-09 | Disposition: A | Discharge status: Home / Self Care | Payer: Self-pay | Source: Ambulatory Visit | Attending: Neurosurgery | Admitting: Neurosurgery

## 2024-07-09 ENCOUNTER — Telehealth: Admitting: Neuroradiology

## 2024-07-09 DIAGNOSIS — Z049 Encounter for examination and observation for unspecified reason: Secondary | ICD-10-CM

## 2024-07-11 DIAGNOSIS — G4733 Obstructive sleep apnea (adult) (pediatric): Secondary | ICD-10-CM | POA: Diagnosis not present

## 2024-07-14 NOTE — Progress Notes (Signed)
 Cardiology Office Note:    Date:  07/14/2024   ID:  Mark Stewart, DOB 16-Mar-1946, MRN [id number]  PCP:  Nicholaus Credit, PA-C  Cardiologist:  Jerel Balding, MD    Referring MD: Nicholaus Credit, PA-C   No chief complaint on file. Severe hypertension  History of Present Illness:    Mark Stewart is a 78 y.o. male with a hx of long-standing and severe hypertension, insulin  requiring type 2 diabetes mellitus, hyperlipidemia, right thalamic ischemic stroke in May 2017 with mild residual left lower extremity weakness.  In early September he was hospitalized with suspicion for stroke in [place].  MRI of the head did not show evidence of acute ischemia but showed a known old infarct involving the right basal ganglia and moderate small vessel ischemic changes.  CT angiography of the head and neck did not show any evidence of acute large vessel occlusion but did show unchanged chronic high-grade stenosis of the distal right internal carotid artery at the supraclinoid segment he also underwent an echocardiogram just showed normal left ventricular systolic function, moderate LVH, EF 55 to 60%, mildly dilated left atrium, no significant valvular abnormalities.  GLS was reduced at -11.7%.  It appears that he has recovered all neurological complaints.  He denies shortness of breath or chest pain and has minimal ankle swelling; does not have orthopnea, PND or chest pain.  No further neurological events.  He requires numerous medications for blood pressure control.  Blood pressure today is relatively low at 130/56 and in particular his diastolic blood pressure is quite low.  Treatment with minoxidil  has not been complicated by excessive edema, hirsutism, tachycardia or pericardial effusion to date.  Metabolic control is fair, but not ideal.  Most recent LDL cholesterol was 75, but chronically low HDL is 34.  Glycemic control is poor with a hemoglobin A1c of 9.2%.  Duplex US  did not show evidence of renal  artery stenosis. 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  Past Medical History:  Diagnosis Date   Allergy    Cancer (HCC)    skin, hand   Cataract    history of surgery bilaterally   Hypercholesteremia    Hypertension    ICAO (internal carotid artery occlusion)    Spleen absent    patient states had spleen removed in high school   Stroke [facility]) 02/2016    Past Surgical History:  Procedure Laterality Date   EYE SURGERY     HERNIA REPAIR  02/28/2018   HERNIA REPAIR Bilateral 11/2019   PROSTATE SURGERY     SPLENECTOMY      Current Medications: Current Meds  Medication Sig   amLODipine  (NORVASC ) 10 MG tablet TAKE 1 TABLET BY MOUTH EVERY DAY   aspirin  EC 81 MG tablet Take 81 mg by mouth daily.   atorvastatin  (LIPITOR) 80 MG tablet TAKE 1 TABLET BY MOUTH EVERY DAY   B-D UF III MINI PEN NEEDLES 31G X 5 MM MISC    Cholecalciferol (VITAMIN D3 PO) Take by mouth daily.   cloNIDine  (CATAPRES  - DOSED IN MG/24 HR) 0.2 mg/24hr patch APPLY 1 PATCH TO SKIN WEEKLY   clopidogrel (PLAVIX) 75 MG tablet Take 75 mg by mouth daily.   empagliflozin (JARDIANCE) 25 MG TABS tablet Take 25 mg by mouth daily.    fluticasone  (FLONASE ) 50 MCG/ACT nasal spray PLACE 2 SPRAYS INTO BOTH NOSTRILS DAILY AS NEEDED FOR ALLERGIES OR RHINITIS. REPORTED ON 04/26/2016   Insulin  Disposable Pump (OMNIPOD DASH  5 PACK PODS) MISC    minoxidil  (LONITEN ) 10 MG tablet TAKE 1/2 TABLET BY MOUTH DAILY   NOVOLOG  100 UNIT/ML injection USE AS DIRECTED IN INSULIN  PUMP. MDD: 100UNITS/DAY   sildenafil (VIAGRA) 100 MG tablet Take 100 mg by mouth as needed.   spironolactone  (ALDACTONE ) 50 MG tablet Take 1 tablet (50 mg total) by mouth daily.   valsartan  (DIOVAN ) 320 MG tablet Take 1 tablet (320 mg total) by mouth daily.   [DISCONTINUED] carvedilol  (COREG ) 25 MG tablet Take 1 tablet (25 mg total) by mouth 2 (two) times daily with a meal.     Allergies:   Bactrim   [sulfamethoxazole -trimethoprim ], Benazepril, Metformin and related, and Penicillins   Social History   Socioeconomic History   Marital status: Married    Spouse name: graye   Number of children: Not on file   Years of education: Not on file   Highest education level: Not on file  Occupational History   Not on file  Tobacco Use   Smoking status: Former    Current packs/day: 1.00    Average packs/day: 1 pack/day for 20.0 years (20.0 ttl pk-yrs)    Types: Cigarettes   Smokeless tobacco: Never   Tobacco comments:    patient states last cigarette 35 years ago  Substance and Sexual Activity   Alcohol use: No   Drug use: No   Sexual activity: Not Currently  Other Topics Concern   Not on file  Social History Narrative   Lives with wife   Right Handed   Drinks 4-5 cups caffeine daily   Social Drivers of Health   Financial Resource Strain: Low Risk  (06/18/2024)   Overall Financial Resource Strain (CARDIA)    Difficulty of Paying Living Expenses: Not hard at all  Food Insecurity: No Food Insecurity (06/18/2024)   Hunger Vital Sign    Worried About Running Out of Food in the Last Year: Never true    Ran Out of Food in the Last Year: Never true  Transportation Needs: No Transportation Needs (06/18/2024)   PRAPARE - Administrator, Civil Service (Medical): No    Lack of Transportation (Non-Medical): No  Physical Activity: Inactive (06/18/2024)   Exercise Vital Sign    Days of Exercise per Week: 0 days    Minutes of Exercise per Session: 0 min  Stress: No Stress Concern Present (06/18/2024)   [employer] of Occupational Health - Occupational Stress Questionnaire    Feeling of Stress: Not at all  Social Connections: Socially Integrated (06/18/2024)   Social Connection and Isolation Panel    Frequency of Communication with Friends and Family: More than three times a week    Frequency of Social Gatherings with Friends and Family: More than three times a week     Attends Religious Services: More than 4 times per year    Active Member of [employer] or Organizations: Yes    Attends Engineer, structural: More than 4 times per year    Marital Status: Married     Family History: The patient's family history includes Breast cancer in his mother and sister; Diabetes Mellitus II in his father. There is no history of Sleep apnea. ROS:   Please see the history of present illness.    All other systems are reviewed and are negative.  EKGs/Labs/Other Studies Reviewed:    MRI from 02/26/2016 shows an acute/subacute ischemic infarct involving the right thalamus, also mild chronic small vessel ischemic disease Echo from 02/27/2016 shows a  mildly dilated left ventricle (LVEDD 55 mm may actually be normal), normal EF 55-60 percent, abnormal relaxation, left atrium 39 mm    Reports of MRI head, CT angiography head and neck and transthoracic echocardiogram from [facility] 09/11 - 06/19/2024 were reviewed.  EKG: Personally reviewed ECG from 07/07/2024, which shows normal sinus rhythm with first-degree AV block (226 ms) otherwise normal tracing  EKG Interpretation Date/Time:    Ventricular Rate:    PR Interval:    QRS Duration:    QT Interval:    QTC Calculation:   R Axis:      Text Interpretation:           Recent Labs: 07/07/2024: ALT 41; BUN 23; Creatinine, Ser 1.49; Hemoglobin 15.2; Platelets 326; Potassium 4.5; Sodium 142; TSH 2.100   Recent Lipid Panel    Component Value Date/Time   CHOL 169 07/07/2024 1147   TRIG 117 07/07/2024 1147   HDL 36 (L) 07/07/2024 1147   CHOLHDL 4.7 07/07/2024 1147   CHOLHDL 3.6 02/27/2016 0536   VLDL 16 02/27/2016 0536   LDLCALC 112 (H) 07/07/2024 1147   August 20 2018 lipid profile Total cholesterol 158, HDL 51, LDL 111, triglycerides 106 Hemoglobin A1c 12%  06/02/2019 total cholesterol 134, HDL 41, LDL 75, triglycerides 88 Hemoglobin A1c 8.1%  Physical Exam:    VS:  BP (!) 130/56   Pulse  71   Ht 6' (1.829 m)   Wt 216 lb (98 kg)   SpO2 93%   BMI 29.29 kg/m     Wt Readings from Last 3 Encounters:  07/07/24 216 lb (98 kg)  07/07/24 215 lb (97.5 kg)  07/01/24 211 lb (95.7 kg)     General: Alert, oriented x3, no distress, overweight/borderline obese Head: no evidence of trauma, PERRL, EOMI, no exophtalmos or lid lag, no myxedema, no xanthelasma; normal ears, nose and oropharynx Neck: normal jugular venous pulsations and no hepatojugular reflux; brisk carotid pulses without delay and no carotid bruits Chest: clear to auscultation, no signs of consolidation by percussion or palpation, normal fremitus, symmetrical and full respiratory excursions Cardiovascular: normal position and quality of the apical impulse, regular rhythm, normal first and second heart sounds, no murmurs, rubs or gallops Abdomen: no tenderness or distention, no masses by palpation, no abnormal pulsatility or arterial bruits, normal bowel sounds, no hepatosplenomegaly Extremities: no clubbing, cyanosis or edema; 2+ radial, ulnar and brachial pulses bilaterally; 2+ right femoral, posterior tibial and dorsalis pedis pulses; 2+ left femoral, posterior tibial and dorsalis pedis pulses; no subclavian or femoral bruits Neurological: grossly nonfocal Psych: Normal mood and affect     ASSESSMENT:    1. TIA due to embolism (HCC)   2. Essential hypertension   3. Hemiparesis affecting left side as late effect of stroke (HCC)   4. Dyslipidemia (high LDL; low HDL)   5. Poorly controlled diabetes mellitus (HCC)   6. Stage 3a chronic kidney disease (HCC)       PLAN:    In order of problems listed above: TIA: No evidence of new neurological injury on brain imaging studies.  I wonder whether his neurological deficits were due to hypotension with hypoperfusion in the territory fed by the stenotic distal internal carotid artery.  We may be providing too good blood pressure control.  He is also at risk for  developing atrial fibrillation since he has chronic LVH and left atrial dilation.  Will have him wear a monitor.  If this is negative I would follow that up  with an implantable loop recorder, since the risk of atrial fibrillation is quite high at his age. HTN: Diastolic blood pressure is rather low.  Reduce the dose of carvedilol  to 12.5 mg twice daily. L hemiparesis: Recent focal neurological events but no new permanent neurological deficits.  Has chronic weakness in his left lower extremity from his previous right basal ganglia stroke.  Currently on aspirin  and clopidogrel has been added.   HLP: Target LDL cholesterol less than 70 (very close to diet) but also has chronically low HDL cholesterol. DM: Poor glycemic control.  Complicated by vascular disease and neuropathy.  Needs to work better on maintaining a diet low in sugars and starchy foods.  We discussed this in detail today. CKD 3B: Throughout the last several years his creatinine has been normal in the 1.0-1.2 range, but over the last 18 months creatinine has been around 1.4, corresponding to a GFR of approximately 50.  Reminded him to stay well-hydrated, especially since he takes Jardiance.  Glucosuria has probably contributed to his low creatinine levels.  Medication Adjustments/Labs and Tests Ordered: Current medicines are reviewed at length with the patient today.  Concerns regarding medicines are outlined above.  No orders of the defined types were placed in this encounter.   Meds ordered this encounter  Medications   carvedilol  (COREG ) 12.5 MG tablet    Sig: Take 1 tablet (12.5 mg total) by mouth 2 (two) times daily with a meal.    Dispense:  60 tablet    Refill:  11    Patient Instructions  Medication Instructions:  Decrease Carvedilol  to 12.5 mg twice daily *If you need a refill on your cardiac medications before your next appointment, please call your pharmacy*  Send in a daily BP log in about a week  Lab Work: None  ordered If you have labs (blood work) drawn today and your tests are completely normal, you will receive your results only by: MyChart Message (if you have MyChart) OR A paper copy in the mail If you have any lab test that is abnormal or we need to change your treatment, we will call you to review the results.  Testing/Procedures: Your physician has recommended that you wear a 7 DAY ZIO-PATCH monitor. The Zio patch cardiac monitor continuously records heart rhythm data for up to 14 days, this is for patients being evaluated for multiple types heart rhythms. For the first 24 hours post application, please avoid getting the Zio monitor wet in the shower or by excessive sweating during exercise. After that, feel free to carry on with regular activities. Keep soaps and lotions away from the ZIO XT Patch.  This will be mailed to you, please expect 7-10 days to receive.    Applying the monitor   Shave hair from upper left chest.   Hold abrader disc by orange tab.  Rub abrader in 40 strokes over left upper chest as indicated in your monitor instructions.   Clean area with 4 enclosed alcohol pads .  Use all pads to assure are is cleaned thoroughly.  Let dry.   Apply patch as indicated in monitor instructions.  Patch will be place under collarbone on left side of chest with arrow pointing upward.   Rub patch adhesive wings for 2 minutes.Remove white label marked 1.  Remove white label marked 2.  Rub patch adhesive wings for 2 additional minutes.   While looking in a mirror, press and release button in center of patch.  A small green light  will flash 3-4 times .  This will be your only indicator the monitor has been turned on.     Do not shower for the first 24 hours.  You may shower after the first 24 hours.   Press button if you feel a symptom. You will hear a small click.  Record Date, Time and Symptom in the Patient Log Book.   When you are ready to remove patch, follow instructions on last  2 pages of Patient Log Book.  Stick patch monitor onto last page of Patient Log Book.   Place Patient Log Book in [place] box.  Use locking tab on box and tape box closed securely.  The [employer] and [employer] has [employer] on it.  Please place in mailbox as soon as possible.  Your physician should have your test results approximately 7 days after the monitor has been mailed back to [facility].   Call [facility] Customer Care at [phone] if you have questions regarding your ZIO XT patch monitor.  Call them immediately if you see an orange light blinking on your monitor.   If your monitor falls off in less than 4 days contact our Monitor department at [phone].  If your monitor becomes loose or falls off after 4 days call [employer] at [phone] for suggestions on securing your monitor   Follow-Up: At [facility], you and your health needs are our priority.  As part of our continuing mission to provide you with exceptional heart care, our providers are all part of one team.  This team includes your primary Cardiologist (physician) and Advanced Practice Providers or APPs (Physician Assistants and Nurse Practitioners) who all work together to provide you with the care you need, when you need it.  Your next appointment:   1 year(s)  Provider:   Jerel Balding, MD    We recommend signing up for the patient portal called MyChart.  Sign up information is provided on this After Visit Summary.  MyChart is used to connect with patients for Virtual Visits (Telemedicine).  Patients are able to view lab/test results, encounter notes, upcoming appointments, etc.  Non-urgent messages can be sent to your provider as well.   To learn more about what you can do with MyChart, go to [email or web address].      Signed, Jerel Balding, MD  07/14/2024 8:47 PM    [facility]

## 2024-07-15 ENCOUNTER — Telehealth: Payer: Self-pay

## 2024-07-15 DIAGNOSIS — Z9641 Presence of insulin pump (external) (internal): Secondary | ICD-10-CM | POA: Diagnosis not present

## 2024-07-15 DIAGNOSIS — E118 Type 2 diabetes mellitus with unspecified complications: Secondary | ICD-10-CM | POA: Diagnosis not present

## 2024-07-15 DIAGNOSIS — Z794 Long term (current) use of insulin: Secondary | ICD-10-CM | POA: Diagnosis not present

## 2024-07-15 DIAGNOSIS — E1142 Type 2 diabetes mellitus with diabetic polyneuropathy: Secondary | ICD-10-CM | POA: Diagnosis not present

## 2024-07-15 NOTE — Telephone Encounter (Signed)
 Patient has appointment tomorrow at 9:30 Am and he was made aware. Copied from CRM [id number]. Topic: General - Other >> Jul 13, 2024  3:25 PM Antony S wrote: Reason for CRM: calling back to schedule ultrasound

## 2024-07-16 ENCOUNTER — Inpatient Hospital Stay (HOSPITAL_BASED_OUTPATIENT_CLINIC_OR_DEPARTMENT_OTHER)
Admission: RE | Admit: 2024-07-16 | Discharge: 2024-07-16 | Attending: Physician Assistant | Admitting: Physician Assistant

## 2024-07-16 DIAGNOSIS — Z8673 Personal history of transient ischemic attack (TIA), and cerebral infarction without residual deficits: Secondary | ICD-10-CM

## 2024-07-16 DIAGNOSIS — G459 Transient cerebral ischemic attack, unspecified: Secondary | ICD-10-CM

## 2024-07-16 DIAGNOSIS — I6522 Occlusion and stenosis of left carotid artery: Secondary | ICD-10-CM | POA: Diagnosis not present

## 2024-07-20 DIAGNOSIS — E118 Type 2 diabetes mellitus with unspecified complications: Secondary | ICD-10-CM | POA: Diagnosis not present

## 2024-07-20 DIAGNOSIS — Z794 Long term (current) use of insulin: Secondary | ICD-10-CM | POA: Diagnosis not present

## 2024-07-22 ENCOUNTER — Encounter: Payer: Self-pay | Admitting: Cardiovascular Disease

## 2024-07-24 ENCOUNTER — Ambulatory Visit: Payer: Self-pay | Admitting: Cardiovascular Disease

## 2024-07-24 DIAGNOSIS — R5383 Other fatigue: Secondary | ICD-10-CM | POA: Diagnosis not present

## 2024-07-24 DIAGNOSIS — R42 Dizziness and giddiness: Secondary | ICD-10-CM

## 2024-07-27 DIAGNOSIS — E118 Type 2 diabetes mellitus with unspecified complications: Secondary | ICD-10-CM | POA: Diagnosis not present

## 2024-07-27 DIAGNOSIS — Z794 Long term (current) use of insulin: Secondary | ICD-10-CM | POA: Diagnosis not present

## 2024-07-27 NOTE — Telephone Encounter (Signed)
 Croitoru, Mihai, MD to Mark Stewart     07/22/24  5:57 PM There is moderate blockage in the right internal carotid artery at the level of the neck.  It is only 50 to 69% in severity, less than the typical threshold where we would recommend surgery or stenting.  Normal does have a tighter carotid stenosis much farther downstream in the carotid artery inside the skull, that seems to be the more serious culprit.  However it is conceivable that these blockages in the series make the overall reduction and blood flow to the brain worse.  My best guess is that I do not think our vascular surgeons would recommend intervention, but if you would like us  to get their opinion, I would be happy to refer you for a consultation. Dr, JAYSON Francyne Headland, MD to Mark Credit, PA-C  Mark Comer CROME, RN    07/24/24  2:43 PM Result Note No serious arrhythmia seen on the monitor.  When normal and had dizziness or lightheadedness the rhythm was normal.  We did not see atrial fibrillation, but this remains a concern as a possible cause for his transient ischemic attack.  Recommend loop recorder implantation.   Katie, will need to find a spot to do this.  I think my only option is to do it at the end of the quarter day on Friday, November 7.  I do not have any Thursday afternoon procedure days available until February except for December 4th, when there are already 2 pacemaker procedure scheduled.   S/w the patient and went over all of the information above. He does not wish to be referred to a vascular surgeon at this time, but he did ask me to route this result to his Neuro surgeon (routed result to Dr Lester).  He agreed to the loop recorder insertion. Scheduled it for Friday Nov 7th at 10:30.  Instructed to use CHG wash/scrub the night before and the morning of the procedure (went over directions and told that he can get it from us  or at [facility] or [facility]- he said he would get it at the store) He said he would call us   back if for some reason this date conflicts with his wife's schedule (she is not there with their calendar) He verbalized understanding of all information.

## 2024-07-28 DIAGNOSIS — E118 Type 2 diabetes mellitus with unspecified complications: Secondary | ICD-10-CM | POA: Diagnosis not present

## 2024-07-29 ENCOUNTER — Telehealth: Payer: Self-pay | Admitting: Podiatry

## 2024-07-29 ENCOUNTER — Telehealth: Payer: Self-pay | Admitting: Neuroradiology

## 2024-07-29 NOTE — Telephone Encounter (Signed)
 Patient is requesting a referral for diabetic shoe.

## 2024-07-29 NOTE — Telephone Encounter (Signed)
 Paperwork has been faxed to [employer]. Called and LM for pt to inform him that paper work has been sent and to call if he has any other questions or concerns.

## 2024-07-29 NOTE — Telephone Encounter (Signed)
 He had a TIA on June 19, 2024 with garbled speech and abnormal movements of both arms.  His imaging was negative for acute stroke.  He was found to have a right supraclinoid ICA stenosis.  He does have a history of a right thalamic infarct.  I checked a carotid ultrasound to be sure that he had no extracranial carotid artery stenosis, and there is no significant cervical carotid stenosis.  He was started on clopidogrel because of the event.  He can stop the clopidogrel at this point but should continue with low-dose aspirin .  There is no routine follow-up needed for the carotid stenosis.  I have not scheduled any routine follow-up with me, but I am glad to see him again at any time as needed.

## 2024-08-13 ENCOUNTER — Telehealth: Payer: Self-pay | Admitting: Cardiovascular Disease

## 2024-08-13 NOTE — Telephone Encounter (Signed)
 Spoke with the patient and discussed loop recorder implant that is scheduled for tomorrow. Patient verbalized understanding.

## 2024-08-13 NOTE — Telephone Encounter (Signed)
 Patient wants a call back for any other instructions he needs prior to his procedure tomorrow.

## 2024-08-14 ENCOUNTER — Ambulatory Visit: Attending: Cardiovascular Disease | Admitting: Cardiovascular Disease

## 2024-08-14 DIAGNOSIS — I639 Cerebral infarction, unspecified: Secondary | ICD-10-CM

## 2024-08-14 DIAGNOSIS — Z95818 Presence of other cardiac implants and grafts: Secondary | ICD-10-CM

## 2024-08-14 MED ORDER — LIDOCAINE-EPINEPHRINE 1 %-1:100000 IJ SOLN
10.0000 mL | Freq: Once | INTRAMUSCULAR | Status: AC
  Administered 2024-08-14: 10 mL via INTRADERMAL

## 2024-08-14 NOTE — Progress Notes (Signed)
 LOOP RECORDER IMPLANT   Procedure report  Loop recorder implantation   Reason for procedure:  Cryptogenic stroke Procedure performed by:  Jerel Balding, MD  Complications:  None  Estimated blood loss:  <5 mL  Medications administered during procedure:  Lidocaine  1% with 1/100,000 epinephrine 10 mL locally Device details:  Medtronic Reveal Linq model number [id number], serial number [id number] Procedure details:  After the risks and benefits of the procedure were discussed the patient provided informed consent.  A procedural timeout was performed.  The patient was prepped and draped in usual sterile fashion. Local anesthesia was administered to an area 2 cm to the left of the sternum in the 4th intercostal space. A cutaneous incision was made using the incision tool. The introducer was then used to create a subcutaneous tunnel and carefully deploy the device. Local pressure was held to ensure hemostasis.  The incision was closed with SteriStrips and a sterile dressing was applied.  R waves 0.32 mV.  Distinct P wave seen.  Tanner Vigna, MD, FACC [facility] [phone] office [phone] pager 08/14/2024 11:14 AM

## 2024-08-14 NOTE — Patient Instructions (Signed)
  Implantable Loop Recorder Placement, Care After This sheet gives you information about how to care for yourself after your procedure. Your health care provider may also give you more specific instructions. If you have problems or questions, contact your health care provider. What can I expect after the procedure? After the procedure, it is common to have: Soreness or discomfort near the incision. Some swelling or bruising near the incision.  Follow these instructions at home: Incision care  Monitor your cardiac device site for redness, swelling, and drainage. Call the device clinic at [phone] if you experience these symptoms or fever/chills.  Keep the large square bandage on your site for 24 hours and then you may remove it yourself. Keep the steri-strips underneath in place.   You may shower after 72 hours / 3 days from your procedure with the steri-strips in place. They will usually fall off on their own, or may be removed after 10 days. Pat dry.   Avoid lotions, ointments, or perfumes over your incision until it is well-healed.  Please do not submerge in water until your site is completely healed.   Your device is MRI compatible.   Remote monitoring is used to monitor your cardiac device from home. This monitoring is scheduled every month by our office. It allows [place] to keep an eye on the function of your device to ensure it is working properly.  If your wound site starts to bleed apply pressure.    For help with the monitor please call Medtronic Monitor Support Specialist directly at [phone].    If you have any questions/concerns please call the device clinic at [phone].  Activity  Return to your normal activities.  General instructions Follow instructions from your health care provider about how to manage your implantable loop recorder and transmit the information. Learn how to activate a recording if this is necessary for your type of device. You may go  through a metal detection gate, and you may let someone hold a metal detector over your chest. Show your ID card if needed. Do not have an MRI unless you check with your health care provider first. Take over-the-counter and prescription medicines only as told by your health care provider. Keep all follow-up visits as told by your health care provider. This is important. Contact a health care provider if: You have redness, swelling, or pain around your incision. You have a fever. You have pain that is not relieved by your pain medicine. You have triggered your device because of fainting (syncope) or because of a heartbeat that feels like it is racing, slow, fluttering, or skipping (palpitations). Get help right away if you have: Chest pain. Difficulty breathing. Summary After the procedure, it is common to have soreness or discomfort near the incision. Change your dressing as told by your health care provider. Follow instructions from your health care provider about how to manage your implantable loop recorder and transmit the information. Keep all follow-up visits as told by your health care provider. This is important. This information is not intended to replace advice given to you by your health care provider. Make sure you discuss any questions you have with your health care provider. Document Released: 09/05/2015 Document Revised: 11/09/2017 Document Reviewed: 11/09/2017 Elsevier Patient Education  2020 [employer].

## 2024-08-17 ENCOUNTER — Telehealth: Payer: Self-pay

## 2024-08-17 DIAGNOSIS — R351 Nocturia: Secondary | ICD-10-CM | POA: Diagnosis not present

## 2024-08-17 DIAGNOSIS — N401 Enlarged prostate with lower urinary tract symptoms: Secondary | ICD-10-CM | POA: Diagnosis not present

## 2024-08-17 DIAGNOSIS — R310 Gross hematuria: Secondary | ICD-10-CM | POA: Diagnosis not present

## 2024-08-17 NOTE — Telephone Encounter (Signed)
 Wife called in LVM over the weekend pt steri strips came off and was bleeding profusely and wants to know what to do

## 2024-08-17 NOTE — Telephone Encounter (Signed)
 Returned call to Pt's wife.  She states over the weekend Pt's loop insertion site began to bleed.   She states it bled through the Pt's shirt.  She removed the outer dressing and when she removed that the steristrips fell off.  She states the site was not bleeding when she removed the outer bandage.  She cleaned around the insertion site and placed new steristrips.  She has not noticed any bleeding since.  Advised Pt she did the right thing.  She has not assessed site this morning, but she will call if any further bleeding.

## 2024-08-18 DIAGNOSIS — H4311 Vitreous hemorrhage, right eye: Secondary | ICD-10-CM | POA: Diagnosis not present

## 2024-08-20 DIAGNOSIS — E113511 Type 2 diabetes mellitus with proliferative diabetic retinopathy with macular edema, right eye: Secondary | ICD-10-CM | POA: Diagnosis not present

## 2024-08-20 LAB — OPHTHALMOLOGY REPORT-SCANNED

## 2024-08-24 DIAGNOSIS — K8020 Calculus of gallbladder without cholecystitis without obstruction: Secondary | ICD-10-CM | POA: Diagnosis not present

## 2024-08-24 DIAGNOSIS — K5900 Constipation, unspecified: Secondary | ICD-10-CM | POA: Diagnosis not present

## 2024-08-24 DIAGNOSIS — R310 Gross hematuria: Secondary | ICD-10-CM | POA: Diagnosis not present

## 2024-08-24 DIAGNOSIS — K7689 Other specified diseases of liver: Secondary | ICD-10-CM | POA: Diagnosis not present

## 2024-08-24 DIAGNOSIS — N200 Calculus of kidney: Secondary | ICD-10-CM | POA: Diagnosis not present

## 2024-08-24 DIAGNOSIS — N400 Enlarged prostate without lower urinary tract symptoms: Secondary | ICD-10-CM | POA: Diagnosis not present

## 2024-08-25 DIAGNOSIS — N200 Calculus of kidney: Secondary | ICD-10-CM | POA: Diagnosis not present

## 2024-08-27 DIAGNOSIS — E118 Type 2 diabetes mellitus with unspecified complications: Secondary | ICD-10-CM | POA: Diagnosis not present

## 2024-08-28 ENCOUNTER — Encounter: Payer: Self-pay | Admitting: Cardiovascular Disease

## 2024-08-28 ENCOUNTER — Ambulatory Visit: Attending: Cardiovascular Disease | Admitting: Cardiovascular Disease

## 2024-08-28 DIAGNOSIS — Z95818 Presence of other cardiac implants and grafts: Secondary | ICD-10-CM

## 2024-08-28 DIAGNOSIS — Z95810 Presence of automatic (implantable) cardiac defibrillator: Secondary | ICD-10-CM

## 2024-08-28 NOTE — Progress Notes (Signed)
 Limited video visit to assess healing of loop recorder site. He does have some dependent ecchymosis, but the actual surgical incision is well-healed with a healthy appearing scar.  There is no evidence of redness and the patient denies tenderness or swelling at the site.  Has not had fever or chills.   Will proceed with monthly downloads.

## 2024-08-31 ENCOUNTER — Encounter: Payer: Self-pay | Admitting: *Deleted

## 2024-09-02 ENCOUNTER — Ambulatory Visit: Admitting: Emergency Medicine

## 2024-09-02 ENCOUNTER — Telehealth: Payer: Self-pay | Admitting: *Deleted

## 2024-09-02 NOTE — Telephone Encounter (Signed)
 Called and spoke to wife (pt was in shower). They have been overwhelmed with many new appointments and the busy holiday season and wondered if they needed to be seen on their original appt (09/02/24).   Incision is healed, per wife. No signs of infection, no drainage, no fowl odor, no chills/fever. No swelling.   Pt is fine and has been dealing with a lot of new medical changes/appointments.   Okay for pt to be seen on 10/30/24, per Rana, NP.   Advised wife to call us  back if they had any new cardiology related concerns. She verbalized understanding.

## 2024-09-02 NOTE — Progress Notes (Deleted)
 Cardiology Office Note:    Date:  09/02/2024  ID:  Mark Stewart, DOB July 06, 1946, MRN [id number] PCP: Nicholaus Credit, PA-C  [facility] Providers Cardiologist:  Jerel Balding, MD { Click to update primary MD,subspecialty MD or APP then REFRESH:1}    {Click to Open Review  :1}   Patient Profile:       Chief Complaint: *** History of Present Illness:  Mark Stewart is a 78 y.o. male with visit-pertinent history of  hypertension, OSA, type 2 diabetes requiring insulin , HLD, right thalamic CVA 02/2016, mild residual left lower extremity weakness.    He established care with cardiology service on 10/17/2018 with Dr. Balding for resistant HTN. Renal artery duplex US  on 08/2019 did not show evidence of renal artery stenosis. 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   He was seen in follow-up by Dr. Balding on 09/07/2022.  He presented with his wife.  He was noted to be fairly sedentary.  He continued to have mild orthostatic dizziness.  He was getting up gradually.  He did note 1 fall in the bedroom.  This was felt to be related to lower extremity weakness and left knee pain.  He denied dizziness and loss of consciousness.  He was noted to have mild lower extremity ankle edema.  Was seen in clinic on 07/07/2024.  He was recently hospitalized in early September 2025 with suspicion for stroke.  MRI of the head did not show evidence of acute ischemia but showed a known old infarct involving the right basal ganglia and moderate small vessel ischemic changes.  CT angio of the head and neck did not show any evidence of acute large vessel occlusion but did show unchanged chronic high-grade stenosis of the distal right internal carotid artery.  Underwent echocardiogram that showed normal left ventricular function, moderate LVH, EF 55 to 60%, mildly dilated left atrium, no significant valvular abnormalities.  His diastolic blood pressures were low and his  carvedilol  was reduced to 12.5 mg twice daily.  ZIO on 06/2024 showed 2 episodes of nonsustained ventricular tachycardia with relatively low rates lasting up to 13 seconds in duration, brief episodes of nonsustained ectopic atrial tachycardia, no evidence of severe bradycardia, sinus pauses or high-grade AV block.  The patient's symptoms do not correlate with episodes of arrhythmia.  On 08/14/2024 he underwent loop recorder implantation.  Discussed the use of AI scribe software for clinical note transcription with the patient, who gave verbal consent to proceed.  History of Present Illness     Review of systems:  Please see the history of present illness. All other systems are reviewed and otherwise negative. ***      Studies Reviewed:        ***  Risk Assessment/Calculations:   {Does this patient have ATRIAL FIBRILLATION?:[phone]} No BP recorded.  {Refresh Note OR Click here to enter BP  :1}***        Physical Exam:   VS:  There were no vitals taken for this visit.   Wt Readings from Last 3 Encounters:  07/07/24 216 lb (98 kg)  07/07/24 215 lb (97.5 kg)  07/01/24 211 lb (95.7 kg)    GEN: Well nourished, well developed in no acute distress NECK: No JVD; No carotid bruits CARDIAC: ***RRR, no murmurs, rubs, gallops RESPIRATORY:  Clear to auscultation without rales, wheezing or rhonchi  ABDOMEN: Soft, non-tender, non-distended EXTREMITIES:  No edema; No acute deformity ***      Assessment and  Plan:    Assessment and Plan Assessment & Plan      {Are you ordering a CV Procedure (e.g. stress test, cath, DCCV, TEE, etc)?   Press F2        :[id number]}  Dispo:  No follow-ups on file.  Signed, Lum LITTIE Louis, NP

## 2024-09-08 ENCOUNTER — Other Ambulatory Visit: Payer: Self-pay

## 2024-09-08 NOTE — Progress Notes (Signed)
 Pharmacy Quality Measure Review  This patient is appearing on a report for being at risk of failing the adherence measure for cholesterol (statin) medications this calendar year.   Medication: atorvastatin  80 mg Last fill date: 04/16/24 for 90 day supply  Spoke with [place] over the phone. He states that he has half a bottle of medication remaining. He reports compliance with medication recently but did have difficulty remembering to take it a couple of months ago, which has resulted in a surplus of medication. No refill needed at this time. Encouraged to call pharmacy when refill is needed.    Woodie Jock, PharmD PGY1 Pharmacy Resident  09/08/2024

## 2024-09-14 ENCOUNTER — Ambulatory Visit: Attending: Cardiovascular Disease

## 2024-09-14 DIAGNOSIS — I10 Essential (primary) hypertension: Secondary | ICD-10-CM | POA: Diagnosis not present

## 2024-09-14 DIAGNOSIS — E1142 Type 2 diabetes mellitus with diabetic polyneuropathy: Secondary | ICD-10-CM | POA: Diagnosis not present

## 2024-09-15 LAB — CUP PACEART REMOTE DEVICE CHECK
Date Time Interrogation Session: 20251207234240
Implantable Pulse Generator Implant Date: 20251107

## 2024-09-22 ENCOUNTER — Ambulatory Visit: Payer: Self-pay | Admitting: Cardiovascular Disease

## 2024-09-22 NOTE — Progress Notes (Signed)
 Remote Loop Recorder Transmission

## 2024-09-25 ENCOUNTER — Encounter: Admitting: Podiatry

## 2024-09-25 DIAGNOSIS — Z91199 Patient's noncompliance with other medical treatment and regimen due to unspecified reason: Secondary | ICD-10-CM

## 2024-09-25 NOTE — Progress Notes (Signed)
Patient did not show for scheduled appointment today.

## 2024-09-28 NOTE — Progress Notes (Signed)
 Pharmacy Quality Measure Review  This patient is appearing on a report for being at risk of failing the adherence measure for hypertension (ACEi/ARB) medications this calendar year.   Medication: Valsartan  320 mg Last fill date: 05/07/24 for 90 day supply  Will collaborate with embedded pharmacist to send in refills.  Jenkins Graces, PharmD PGY1 Pharmacy Resident

## 2024-10-09 ENCOUNTER — Other Ambulatory Visit: Payer: Self-pay | Admitting: Physician Assistant

## 2024-10-09 DIAGNOSIS — R053 Chronic cough: Secondary | ICD-10-CM

## 2024-10-15 ENCOUNTER — Ambulatory Visit: Attending: Cardiovascular Disease

## 2024-10-15 DIAGNOSIS — I639 Cerebral infarction, unspecified: Secondary | ICD-10-CM

## 2024-10-16 ENCOUNTER — Telehealth: Payer: Self-pay | Admitting: Cardiovascular Disease

## 2024-10-16 LAB — CUP PACEART REMOTE DEVICE CHECK
Date Time Interrogation Session: 20260107234038
Implantable Pulse Generator Implant Date: 20251107

## 2024-10-16 NOTE — Telephone Encounter (Signed)
 Pt c/o BP issue: STAT if pt c/o blurred vision, one-sided weakness or slurred speech  1. What are your last 5 BP readings? 173/80; 173/90; 193/90;   2. Are you having any other symptoms (ex. Dizziness, headache, blurred vision, passed out)? no  3. What is your BP issue? Running high

## 2024-10-19 ENCOUNTER — Other Ambulatory Visit: Payer: Self-pay

## 2024-10-19 DIAGNOSIS — J4489 Other specified chronic obstructive pulmonary disease: Secondary | ICD-10-CM

## 2024-10-19 MED ORDER — VALSARTAN 320 MG PO TABS: 320.0000 mg | ORAL_TABLET | Freq: Every day | ORAL | 1 refills | Status: AC

## 2024-10-19 NOTE — Telephone Encounter (Signed)
 Let's wait and see him in the office before we make any additional changes.  He is on really high doses of multiple antihypertensives.  If his heart rate allows can increase his carvedilol , but it has not in the past.  Another option is to switch his valsartan  to Entresto.  Finally if he is no longer taking sildenafil we could try BiDil.

## 2024-10-19 NOTE — Telephone Encounter (Signed)
 Spoke with pt. Pt states he went to Endo office last week and his BP was running high. Took it once he was at home and was also high then. (173/80, 173/90, 193/90) Pt denies having any symptoms. Pt has appt with Lum Louis on 1/23. Advised pt of ED/911 precautions and that I would send over to providers for review in case of recommendations before appt.  Pt stated understanding

## 2024-10-20 ENCOUNTER — Ambulatory Visit: Payer: Self-pay | Admitting: Cardiovascular Disease

## 2024-10-20 NOTE — Progress Notes (Signed)
 Remote Loop Recorder Transmission

## 2024-10-28 ENCOUNTER — Encounter: Payer: Self-pay | Admitting: *Deleted

## 2024-10-29 ENCOUNTER — Other Ambulatory Visit: Payer: Self-pay | Admitting: Physician Assistant

## 2024-10-29 DIAGNOSIS — E559 Vitamin D deficiency, unspecified: Secondary | ICD-10-CM

## 2024-10-30 ENCOUNTER — Encounter: Payer: Self-pay | Admitting: Emergency Medicine

## 2024-10-30 ENCOUNTER — Ambulatory Visit: Attending: Emergency Medicine | Admitting: Emergency Medicine

## 2024-10-30 ENCOUNTER — Ambulatory Visit: Admitting: Emergency Medicine

## 2024-10-30 VITALS — BP 180/83 | HR 80 | Resp 17 | Ht 72.0 in | Wt 210.0 lb

## 2024-10-30 DIAGNOSIS — Z95818 Presence of other cardiac implants and grafts: Secondary | ICD-10-CM

## 2024-10-30 DIAGNOSIS — G459 Transient cerebral ischemic attack, unspecified: Secondary | ICD-10-CM

## 2024-10-30 DIAGNOSIS — E1165 Type 2 diabetes mellitus with hyperglycemia: Secondary | ICD-10-CM

## 2024-10-30 DIAGNOSIS — E785 Hyperlipidemia, unspecified: Secondary | ICD-10-CM

## 2024-10-30 DIAGNOSIS — I749 Embolism and thrombosis of unspecified artery: Secondary | ICD-10-CM

## 2024-10-30 DIAGNOSIS — I10 Essential (primary) hypertension: Secondary | ICD-10-CM | POA: Diagnosis not present

## 2024-10-30 DIAGNOSIS — N1831 Chronic kidney disease, stage 3a: Secondary | ICD-10-CM | POA: Diagnosis not present

## 2024-10-30 MED ORDER — CARVEDILOL 25 MG PO TABS: 25.0000 mg | ORAL_TABLET | Freq: Two times a day (BID) | ORAL | 3 refills | Status: AC

## 2024-10-30 NOTE — Progress Notes (Signed)
 " Cardiology Office Note:    Date:  10/30/2024  ID:  Mark Stewart, DOB December 29, 1945, MRN [id number] PCP: Nicholaus Credit, PA-C  [facility] Providers Cardiologist:  Jerel Balding, MD       Patient Profile:       Chief Complaint: Follow-up for hypertension History of Present Illness:  Mark Stewart is a 79 y.o. male with visit-pertinent history of severe hypertension, OSA, type 2 diabetes requiring insulin , HLD, right thalamic CVA 02/2016, mild residual left lower extremity weakness   He established care with cardiology service on 10/17/2018 with Dr. Balding for resistant HTN. Renal artery duplex US  on 08/2019 did not show evidence of renal artery stenosis. 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  He was seen in follow-up by Dr. Balding on 09/07/2022.  He presented with his wife.  He was noted to be fairly sedentary.  He continued to have mild orthostatic dizziness.  He was getting up gradually.  He did note 1 fall in the bedroom.  This was felt to be related to lower extremity weakness and left knee pain.  He denied dizziness and loss of consciousness.  He was noted to have mild lower extremity ankle edema.  In September 2025 he was hospitalized with suspicion for stroke in [place].  MRI of the head did not show evidence of acute ischemia but showed a known old artifact involving the right basal ganglia and moderate small vessel ischemic changes.  CT angiography of the head and neck did not show any evidence of acute large vessel occlusion but did show unchanged chronic high-grade stenosis of the distal right internal carotid artery.  He underwent echocardiogram that showed normal LVEF, moderate LVH, EF 55 to 60%, mildly dilated left atrium, no significant valvular abnormalities.  It appears that he recovered from all neurological complaints.  He followed up with Dr. Balding 1 07/07/2024.  His diastolic blood pressure was low and his carvedilol  was  reduced to 12.5 mg twice daily.  He underwent ZIO monitoring on 06/2024 showing 2 episodes of nonsustained ventricular tachycardia with relatively slow rates lasting up to 13 seconds in duration, rare PVCs with no evidence of severe bradycardia, sinus pauses, or high grade AV block.  He underwent loop recorder implantation on 08/14/2024.   Discussed the use of AI scribe software for clinical note transcription with the patient, who gave verbal consent to proceed.  History of Present Illness Mark Stewart is a 79 year old male with a history of TIA who presents for a routine follow-up and evaluation of uncontrolled hypertension.   Since late December, his home blood pressures have increased to around 180 mmHg from previously lower levels. He is on carvedilol , clonidine  patch, minoxidil , spironolactone , and valsartan . Carvedilol  was reduced in September, after which his blood pressure has been elevated. A recent endocrinology visit also documented high blood pressure.  He is currently without chest pains, dyspnea orthopnea, PND.  Will intermittently have some dizziness but this is unchanged from the past.  No syncope or presyncope.  He denies any new strokelike symptoms and is without any palpitations    Review of systems:  Please see the history of present illness. All other systems are reviewed and otherwise negative.      Studies Reviewed:        Zio 07/07/2024   The dominant rhythm was normal sinus rhythm with normal circadian variation.  First-degree AV block is seen.   Rare premature ventricular contractions  are seen.  There are 2 episodes of nonsustained ventricular tachycardia with relatively slow rates, but lasting up to 13 seconds in duration.   There are rare premature supraventricular contractions.  A few brief episodes of nonsustained ectopic atrial tachycardia are seen.   There is no evidence of severe bradycardia, sinus pauses or high-grade AV block   A few symptom-triggered  recordings are submitted for dizziness/lightheadedness.  They showed normal sinus rhythm.  They do not correlate with the episodes of nonsustained VT or nonsustained atrial tachycardia.   Electronically signed by Jerel Balding MD   Mildly abnormal arrhythmia monitor due to the presence of rare brief episodes of nonsustained ventricular tachycardia and nonsustained ectopic atrial tachycardia.  The patient's symptoms do not correlate with the episodes of arrhythmia.  Risk Assessment/Calculations:     HYPERTENSION CONTROL Vitals:   10/30/24 0946 10/30/24 0947  BP: (!) 188/94 (!) 180/83    The patient's blood pressure is elevated above target today.  In order to address the patient's elevated BP: A new medication was prescribed today.           Physical Exam:   VS:  BP (!) 180/83 (BP Location: Left Arm, Patient Position: Sitting, Cuff Size: Normal)   Pulse 80   Resp 17   Ht 6' (1.829 m)   Wt 210 lb (95.3 kg)   SpO2 93%   BMI 28.48 kg/m    Wt Readings from Last 3 Encounters:  10/30/24 210 lb (95.3 kg)  07/07/24 216 lb (98 kg)  07/07/24 215 lb (97.5 kg)    GEN: Well nourished, well developed in no acute distress NECK: No JVD; No carotid bruits CARDIAC: RRR, no murmurs, rubs, gallops RESPIRATORY:  Clear to auscultation without rales, wheezing or rhonchi  ABDOMEN: Soft, non-tender, non-distended EXTREMITIES:  No edema; No acute deformity      Assessment and Plan:  TIA History of stroke History of stroke in 2017 and TIA 06/2024 S/p ILR on 08/2024 Zio 06/2024 with no evidence of bradycardia, pauses, high-grade AV block or atrial fibrillation - He has no new neurological defects - Most recent ILR check shows no new symptoms and no new A-fib episodes - Continue aspirin  81 mg daily and atorvastatin  80 mg daily  Hypertension Blood pressure today is 188/94 and repeat 180/83 Home blood pressure readings have averaged in the 170s with several readings up to 200s - Home blood  pressure readings began after carvedilol  was reduced on office visit 07/07/2024 for which at that time his diastolic blood pressure was low - Will plan to increase carvedilol  to 25 mg twice daily, heart rate is 80 bpm - Continue amlodipine  10 mg daily, clonidine  0.2 mg patch, minoxidil  5 mg daily, spironolactone  50 mg daily, valsartan  320 mg daily - If blood pressure remains uncontrolled could consider switching valsartan  to Entresto.  Remains on sildenafil also unable to add BiDil.  Could also consider adding chlorthalidone - Follow-up in 2-4 weeks with APP for blood pressure check  Hyperlipidemia, LDL <70 LDL 112 on 06/2024 - Recommended heart healthy dieting and daily physical exercise - Can repeat at follow-up visit consider adding ezetimibe if unable to reach goal - Continue atorvastatin  80 mg daily   T2DM A1c 8.3 on 06/2024 -Poor glycemic control - Managed on Jardiance and insulin   CKD stage IIIb GFR 48 and creatinine 1.49 on 06/2024 - Stable.  Avoid nephrotoxic drugs - Recommended adequate hydration      Dispo:  Return in about 3 weeks (around 11/20/2024).  Signed, Mark LITTIE Louis, NP  "

## 2024-10-30 NOTE — Patient Instructions (Addendum)
 Medication Instructions:  INCREASE CARVEDILOL  TO 25 MG TWICE DAILY.  Lab Work: NONE TO BE DONE TODAY.  Testing/Procedures: NONE  Follow-Up: At [facility], you and your health needs are our priority.  As part of our continuing mission to provide you with exceptional heart care, our providers are all part of one team.  This team includes your primary Cardiologist (physician) and Advanced Practice Providers or APPs (Physician Assistants and Nurse Practitioners) who all work together to provide you with the care you need, when you need it.  Your next appointment:   3 WEEKS  Provider:   MADISON FOUNTAIN, DNP   We recommend signing up for the patient portal called MyChart.  Sign up information is provided on this After Visit Summary.  MyChart is used to connect with patients for Virtual Visits (Telemedicine).  Patients are able to view lab/test results, encounter notes, upcoming appointments, etc.  Non-urgent messages can be sent to your provider as well.   To learn more about what you can do with MyChart, go to [email or web address].   Other Instructions:  Please check your blood pressure daily for two weeks, then contact the office with your readings  Please contact the office with your readings either by phone, by dropping it off in person, or by sending it through MyChart.   Be sure to check your blood pressure one to two hours after taking your medications.  Avoid the following for 30 minutes before checking your blood pressure: No caffeine No alcohol No eating No smoking  No exercise  Five minutes before checking your blood pressure: Use the restroom Sit up straight in a chair with your back supported and feet flat on the floor Remain quiet and do not talk

## 2024-11-01 NOTE — Progress Notes (Signed)
 I agree that the BP was too high and the carvedilol  increase is appropriate. Try not to drop DBP lower than 65 - I think that caused repeated TIA like episodes since he has known intracranial arterial stenosis. Either switching to entresto or adding low dose thiazide sounds appropriate if BP still high. Thanks, [employer]

## 2024-11-06 ENCOUNTER — Telehealth: Payer: Self-pay | Admitting: Physician Assistant

## 2024-11-06 ENCOUNTER — Ambulatory Visit: Admitting: Physician Assistant

## 2024-11-06 ENCOUNTER — Telehealth: Payer: Self-pay

## 2024-11-06 NOTE — Telephone Encounter (Unsigned)
 Copied from CRM [id number]. Topic: Clinical - Refused Triage >> Nov 06, 2024  7:38 AM Mark Stewart wrote: [name] voiced complaints of headaches and dizziness. Declined transfer to triage.  If patient is unestablished, route message to [facility] Nurse Triage If patient is established, route message to the appropriate department clinical pool

## 2024-11-06 NOTE — Telephone Encounter (Signed)
 Copied from CRM #[id number]. Topic: Clinical - Medical Advice >> Nov 06, 2024  2:15 PM Sasha M wrote: Reason for CRM: Pt called in because him and his wife have been sick and wife seen provider today and tested positive for flu b. He has an appt next week 02/06 with Camie and isn;t sure if he should come in sooner or if she can send him some meds to the pharmacy. Please call to advise

## 2024-11-09 ENCOUNTER — Telehealth: Admitting: Physician Assistant

## 2024-11-09 ENCOUNTER — Encounter: Payer: Self-pay | Admitting: Physician Assistant

## 2024-11-09 VITALS — BP 172/106

## 2024-11-09 DIAGNOSIS — I10 Essential (primary) hypertension: Secondary | ICD-10-CM

## 2024-11-09 DIAGNOSIS — J060 Acute laryngopharyngitis: Secondary | ICD-10-CM

## 2024-11-09 MED ORDER — AZITHROMYCIN 250 MG PO TABS: ORAL_TABLET | ORAL | 0 refills | Status: AC

## 2024-11-09 NOTE — Telephone Encounter (Signed)
 Called patient made him a video visit with provider at 1:20 today.

## 2024-11-13 ENCOUNTER — Ambulatory Visit: Admitting: Physician Assistant

## 2024-11-15 ENCOUNTER — Ambulatory Visit

## 2024-11-24 ENCOUNTER — Ambulatory Visit: Admitting: Emergency Medicine

## 2024-12-01 ENCOUNTER — Ambulatory Visit: Admitting: Physician Assistant

## 2024-12-16 ENCOUNTER — Ambulatory Visit

## 2024-12-23 ENCOUNTER — Ambulatory Visit: Admitting: Adult Health

## 2025-06-24 ENCOUNTER — Ambulatory Visit
